# Patient Record
Sex: Female | Born: 1965 | Race: White | Hispanic: No | Marital: Married | Smoking: Never smoker
Health system: Southern US, Community
[De-identification: ages and names within clinical notes are randomized; demographics above are authoritative.]

## PROBLEM LIST (undated history)

## (undated) DIAGNOSIS — R519 Headache, unspecified: Secondary | ICD-10-CM

## (undated) DIAGNOSIS — E079 Disorder of thyroid, unspecified: Secondary | ICD-10-CM

## (undated) DIAGNOSIS — J189 Pneumonia, unspecified organism: Secondary | ICD-10-CM

## (undated) DIAGNOSIS — D219 Benign neoplasm of connective and other soft tissue, unspecified: Secondary | ICD-10-CM

## (undated) DIAGNOSIS — K219 Gastro-esophageal reflux disease without esophagitis: Secondary | ICD-10-CM

## (undated) DIAGNOSIS — Z9889 Other specified postprocedural states: Secondary | ICD-10-CM

## (undated) DIAGNOSIS — Z889 Allergy status to unspecified drugs, medicaments and biological substances status: Secondary | ICD-10-CM

## (undated) DIAGNOSIS — M1990 Unspecified osteoarthritis, unspecified site: Secondary | ICD-10-CM

## (undated) DIAGNOSIS — S86302A Unspecified injury of muscle(s) and tendon(s) of peroneal muscle group at lower leg level, left leg, initial encounter: Secondary | ICD-10-CM

## (undated) DIAGNOSIS — R112 Nausea with vomiting, unspecified: Secondary | ICD-10-CM

## (undated) DIAGNOSIS — S2239XA Fracture of one rib, unspecified side, initial encounter for closed fracture: Secondary | ICD-10-CM

## (undated) DIAGNOSIS — Z803 Family history of malignant neoplasm of breast: Secondary | ICD-10-CM

## (undated) DIAGNOSIS — R7989 Other specified abnormal findings of blood chemistry: Secondary | ICD-10-CM

## (undated) DIAGNOSIS — K9041 Non-celiac gluten sensitivity: Secondary | ICD-10-CM

## (undated) DIAGNOSIS — Z8619 Personal history of other infectious and parasitic diseases: Secondary | ICD-10-CM

## (undated) DIAGNOSIS — G43909 Migraine, unspecified, not intractable, without status migrainosus: Secondary | ICD-10-CM

## (undated) DIAGNOSIS — T8859XA Other complications of anesthesia, initial encounter: Secondary | ICD-10-CM

## (undated) DIAGNOSIS — J45909 Unspecified asthma, uncomplicated: Secondary | ICD-10-CM

## (undated) DIAGNOSIS — R51 Headache: Secondary | ICD-10-CM

## (undated) HISTORY — DX: Gastro-esophageal reflux disease without esophagitis: K21.9

## (undated) HISTORY — DX: Allergy status to unspecified drugs, medicaments and biological substances: Z88.9

## (undated) HISTORY — DX: Fracture of one rib, unspecified side, initial encounter for closed fracture: S22.39XA

## (undated) HISTORY — DX: Unspecified osteoarthritis, unspecified site: M19.90

## (undated) HISTORY — DX: Migraine, unspecified, not intractable, without status migrainosus: G43.909

## (undated) HISTORY — DX: Unspecified asthma, uncomplicated: J45.909

## (undated) HISTORY — DX: Pneumonia, unspecified organism: J18.9

## (undated) HISTORY — PX: CARDIAC CATHETERIZATION: SHX172

## (undated) HISTORY — DX: Unspecified injury of muscle(s) and tendon(s) of peroneal muscle group at lower leg level, left leg, initial encounter: S86.302A

## (undated) HISTORY — DX: Family history of malignant neoplasm of breast: Z80.3

## (undated) HISTORY — DX: Headache, unspecified: R51.9

## (undated) HISTORY — PX: THYROID SURGERY: SHX805

## (undated) HISTORY — DX: Non-celiac gluten sensitivity: K90.41

## (undated) HISTORY — DX: Personal history of other infectious and parasitic diseases: Z86.19

## (undated) HISTORY — DX: Benign neoplasm of connective and other soft tissue, unspecified: D21.9

## (undated) HISTORY — DX: Disorder of thyroid, unspecified: E07.9

## (undated) HISTORY — DX: Headache: R51

---

## 1993-09-18 HISTORY — PX: CHOLECYSTECTOMY: SHX55

## 2004-09-18 HISTORY — PX: LAPAROSCOPIC HYSTERECTOMY: SHX1926

## 2005-09-18 HISTORY — PX: LAPAROSCOPIC HYSTERECTOMY: SHX1926

## 2007-09-19 HISTORY — PX: CARDIAC CATHETERIZATION: SHX172

## 2007-09-19 HISTORY — PX: HAMMER TOE SURGERY: SHX385

## 2011-09-19 HISTORY — PX: THYROID SURGERY: SHX805

## 2013-06-05 LAB — POCT ERYTHROCYTE SEDIMENTATION RATE, NON-AUTOMATED: Sed Rate: 26

## 2013-09-18 HISTORY — PX: HERNIA REPAIR: SHX51

## 2015-09-19 HISTORY — PX: HERNIA REPAIR: SHX51

## 2016-01-21 LAB — LIPID PANEL
CHOLESTEROL: 154 (ref 0–200)
HDL: 46 (ref 35–70)
LDL CALC: 92
Triglycerides: 81 (ref 40–160)

## 2016-01-21 LAB — BASIC METABOLIC PANEL
Glucose: 86
POTASSIUM: 4.2 (ref 3.4–5.3)
SODIUM: 140 (ref 137–147)

## 2016-07-25 LAB — VITAMIN D 25 HYDROXY (VIT D DEFICIENCY, FRACTURES): VIT D 25 HYDROXY: 58.3

## 2016-07-25 LAB — BASIC METABOLIC PANEL
GLUCOSE: 84
POTASSIUM: 4.4 (ref 3.4–5.3)
Sodium: 137 (ref 137–147)

## 2017-07-16 LAB — BASIC METABOLIC PANEL: CREATININE: 0.8 (ref <=1.1)

## 2017-07-16 LAB — CBC AND DIFFERENTIAL
HEMATOCRIT: 41 (ref 36–46)
Hemoglobin: 13.9 (ref 12.0–16.0)
Platelets: 274 (ref 150–399)
WBC: 8

## 2017-07-16 LAB — HEPATIC FUNCTION PANEL
ALT: 72 — AB (ref 7–35)
AST: 36 — AB (ref 13–35)

## 2017-07-30 LAB — TSH: TSH: 1.86 (ref 0.41–5.90)

## 2018-06-28 ENCOUNTER — Ambulatory Visit: Payer: Self-pay | Admitting: Family Medicine

## 2018-06-28 ENCOUNTER — Ambulatory Visit: Payer: BLUE CROSS/BLUE SHIELD | Admitting: Family Medicine

## 2018-06-28 ENCOUNTER — Encounter: Payer: Self-pay | Admitting: Family Medicine

## 2018-06-28 VITALS — BP 104/82 | HR 80 | Temp 98.1°F | Ht 66.5 in | Wt 178.8 lb

## 2018-06-28 DIAGNOSIS — Z0000 Encounter for general adult medical examination without abnormal findings: Secondary | ICD-10-CM

## 2018-06-28 DIAGNOSIS — M1990 Unspecified osteoarthritis, unspecified site: Secondary | ICD-10-CM | POA: Insufficient documentation

## 2018-06-28 DIAGNOSIS — J4520 Mild intermittent asthma, uncomplicated: Secondary | ICD-10-CM

## 2018-06-28 DIAGNOSIS — M1230 Palindromic rheumatism, unspecified site: Secondary | ICD-10-CM

## 2018-06-28 DIAGNOSIS — Z1211 Encounter for screening for malignant neoplasm of colon: Secondary | ICD-10-CM

## 2018-06-28 DIAGNOSIS — E039 Hypothyroidism, unspecified: Secondary | ICD-10-CM

## 2018-06-28 DIAGNOSIS — Z23 Encounter for immunization: Secondary | ICD-10-CM | POA: Diagnosis not present

## 2018-06-28 DIAGNOSIS — J45909 Unspecified asthma, uncomplicated: Secondary | ICD-10-CM | POA: Insufficient documentation

## 2018-06-28 LAB — LIPID PANEL
Cholesterol: 164 mg/dL (ref 0–200)
HDL: 56.2 mg/dL (ref >39.00)
LDL Cholesterol: 90 mg/dL (ref 0–99)
NonHDL: 107.42
TRIGLYCERIDES: 85 mg/dL (ref 0.0–149.0)
Total CHOL/HDL Ratio: 3
VLDL: 17 mg/dL (ref 0.0–40.0)

## 2018-06-28 LAB — COMPREHENSIVE METABOLIC PANEL
ALT: 54 U/L — ABNORMAL HIGH (ref 0–35)
AST: 40 U/L — ABNORMAL HIGH (ref 0–37)
Albumin: 4.5 g/dL (ref 3.5–5.2)
Alkaline Phosphatase: 68 U/L (ref 39–117)
BUN: 21 mg/dL (ref 6–23)
CHLORIDE: 103 meq/L (ref 96–112)
CO2: 28 mEq/L (ref 19–32)
Calcium: 9.6 mg/dL (ref 8.4–10.5)
Creatinine, Ser: 0.78 mg/dL (ref 0.40–1.20)
GFR: 83.03 mL/min (ref >60.00)
GLUCOSE: 82 mg/dL (ref 70–99)
POTASSIUM: 4.7 meq/L (ref 3.5–5.1)
SODIUM: 139 meq/L (ref 135–145)
Total Bilirubin: 0.6 mg/dL (ref 0.2–1.2)
Total Protein: 6.9 g/dL (ref 6.0–8.3)

## 2018-06-28 LAB — CBC WITH DIFFERENTIAL/PLATELET
BASOS PCT: 0.7 % (ref 0.0–3.0)
Basophils Absolute: 0.1 10*3/uL (ref 0.0–0.1)
EOS ABS: 0.1 10*3/uL (ref 0.0–0.7)
EOS PCT: 0.8 % (ref 0.0–5.0)
HCT: 40.2 % (ref 36.0–46.0)
HEMOGLOBIN: 13.5 g/dL (ref 12.0–15.0)
LYMPHS ABS: 2.3 10*3/uL (ref 0.7–4.0)
Lymphocytes Relative: 27.5 % (ref 12.0–46.0)
MCHC: 33.5 g/dL (ref 30.0–36.0)
MCV: 87.1 fl (ref 78.0–100.0)
MONO ABS: 0.5 10*3/uL (ref 0.1–1.0)
Monocytes Relative: 6 % (ref 3.0–12.0)
NEUTROS PCT: 65 % (ref 43.0–77.0)
Neutro Abs: 5.5 10*3/uL (ref 1.4–7.7)
PLATELETS: 285 10*3/uL (ref 150.0–400.0)
RBC: 4.61 Mil/uL (ref 3.87–5.11)
RDW: 13.2 % (ref 11.5–15.5)
WBC: 8.5 10*3/uL (ref 4.0–10.5)

## 2018-06-28 LAB — T4, FREE: Free T4: 0.97 ng/dL (ref 0.60–1.60)

## 2018-06-28 LAB — TSH: TSH: 4.85 u[IU]/mL — AB (ref 0.35–4.50)

## 2018-06-28 MED ORDER — MELOXICAM 15 MG PO TABS: 15.0000 mg | ORAL_TABLET | Freq: Every day | ORAL | 1 refills | Status: DC

## 2018-06-28 MED ORDER — MONTELUKAST SODIUM 10 MG PO TABS: 10.0000 mg | ORAL_TABLET | Freq: Every day | ORAL | 3 refills | Status: DC

## 2018-06-28 MED ORDER — HYDROXYCHLOROQUINE SULFATE 200 MG PO TABS: ORAL_TABLET | ORAL | 0 refills | Status: DC

## 2018-06-28 MED ORDER — ALBUTEROL SULFATE HFA 108 (90 BASE) MCG/ACT IN AERS: 2.0000 | INHALATION_SPRAY | Freq: Four times a day (QID) | RESPIRATORY_TRACT | 5 refills | Status: DC | PRN

## 2018-06-28 MED ORDER — ALLEGRA-D ALLERGY & CONGESTION 180-240 MG PO TB24: 1.0000 | ORAL_TABLET | Freq: Every day | ORAL | 3 refills | Status: DC

## 2018-06-28 MED ORDER — ADVAIR DISKUS 250-50 MCG/DOSE IN AEPB: 1.0000 | INHALATION_SPRAY | Freq: Two times a day (BID) | RESPIRATORY_TRACT | 11 refills | Status: DC

## 2018-06-28 NOTE — Progress Notes (Signed)
Patient: [name] MRN: [id number] DOB: 1965-12-30 PCP: Orma Flaming, MD      Subjective:  Chief Complaint  Patient presents with  . Establish Care    HPI: The patient is a 52 y.o. female who presents today for annual exam. She denies any changes to past medical history. There have been no recent hospitalizations. They are following a well balanced diet and exercise plan. Weight has been stable. No complaints today.   Hypothyroidism: she Is currently on synthroid. She is hypothyroid s/p thyroidectomy due to nodules. No cancer. Denies any symptoms.   Asthma: currently on singulair, advair and albuterol prn. Well controlled.   Arthritis: on plaquenil and mobic. Was followed by rhuematology in [place]. 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  Denies any RA/psoriatic arthritis or other auto immune issue. Has eyes checked yearly.   Immunization History  Administered Date(s) Administered  . Influenza,inj,Quad PF,6+ Mos 06/28/2018  . Tdap 06/28/2018     Colonoscopy: never had this. No FH  Mammogram: 01/2018. normal Pap smear: n/a due to hysterectomy Tdap: today Flu: today    Review of Systems  Constitutional: Negative for chills, fatigue and fever.  HENT: Negative for dental problem, ear pain, hearing loss and trouble swallowing.   Eyes: Negative for visual disturbance.  Respiratory: Negative for cough, chest tightness and shortness of breath.   Cardiovascular: Negative for chest pain, palpitations and leg swelling.  Gastrointestinal: Negative for abdominal pain, blood in stool, diarrhea and nausea.  Endocrine: Negative for cold intolerance, polydipsia, polyphagia and polyuria.  Genitourinary: Negative for dysuria and hematuria.  Musculoskeletal: Positive for back pain. Negative for arthralgias and neck pain.  Skin: Negative for rash.  Neurological: Positive for headaches. Negative for dizziness.  Psychiatric/Behavioral: Positive for sleep disturbance. Negative for dysphoric mood. The  patient is not nervous/anxious.     Allergies Patient is allergic to gluten meal; lortab [hydrocodone-acetaminophen]; shellfish allergy; and strawberry (diagnostic).  Past Medical History Patient  has a past medical history of Arthritis, Asthma, Frequent headaches, GERD (gastroesophageal reflux disease), H/O seasonal allergies, History of chicken pox, Migraines, and Thyroid disease.  Surgical History Patient  has a past surgical history that includes Cholecystectomy (1995); Laparoscopic hysterectomy (2006); Thyroid surgery; Cardiac catheterization; Hammer toe surgery (2009); and Hernia repair (2017).  Family History Pateint's family history includes Alcohol abuse in her brother and paternal grandmother; Arthritis in her mother; Asthma in her brother, daughter, son, and son; Birth defects in her brother; COPD in her sister; Cancer in her brother, mother, paternal grandfather, and paternal grandmother; Diabetes in her father; Early death in her brother; Heart attack in her father, maternal grandmother, and paternal grandfather; Heart disease in her father and maternal grandmother; Hyperlipidemia in her brother, father, and maternal grandmother; Hypertension in her father; Mental illness in her brother; Miscarriages / Stillbirths in her mother; Stroke in her father and maternal grandmother.  Social History Patient  reports that she has never smoked. She has never used smokeless tobacco. She reports that she drinks alcohol. She reports that she does not use drugs.    Objective: Vitals:   06/28/18 0912  BP: 104/82  Pulse: 80  Temp: 98.1 F (36.7 C)  TempSrc: Oral  SpO2: 98%  Weight: 178 lb 12.8 oz (81.1 kg)  Height: 5' 6.5" (1.689 m)    Body mass index is 28.43 kg/m.  Physical Exam  Constitutional: She is oriented to person, place, and time. She appears well-developed and well-nourished.  HENT:  Right Ear: External ear normal.  Left Ear:  External ear normal.  Mouth/Throat:  Oropharynx is clear and moist.  Eyes: Pupils are equal, round, and reactive to light. Conjunctivae and EOM are normal.  Neck: Normal range of motion. Neck supple. No thyromegaly present.  Cardiovascular: Normal rate, regular rhythm, normal heart sounds and intact distal pulses.  No murmur heard. Pulmonary/Chest: Effort normal and breath sounds normal.  Abdominal: Soft. Bowel sounds are normal. She exhibits no distension. There is no tenderness.  Lymphadenopathy:    She has no cervical adenopathy.  Neurological: She is alert and oriented to person, place, and time. She displays normal reflexes. No cranial nerve deficit. Coordination normal.  Skin: Skin is warm and dry. No rash noted.  Psychiatric: She has a normal mood and affect. Her behavior is normal.  Vitals reviewed.      Assessment/plan: 1. Annual physical exam Routine lab work and shots today. mmg sheet given, but does not need until may of 2020. Discussed colon cancer screening and she has decided to do cologuard. Continue healthy diet and exercise. F/u in one year or as needed.  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 Would like her to see rheumatology for this.  - Ambulatory referral to Rheumatology  3. Intermittent asthma without complication, unspecified asthma severity Well controlled. Needs refills of medication. Is requesting proair as well as the ventolin does not work as well for her and thinks it has to do with the inhaler delivery system. Will send both of these in for her.   4. Acquired hypothyroidism Checking labs and then refilling medication.  - TSH - T4, free  5. Encounter for screening colonoscopy  - Cologuard  6. Need for prophylactic vaccination and inoculation against influenza  - Flu Vaccine QUAD 6+ mos PF IM (Fluarix Quad PF)  7. Need for prophylactic vaccination with combined  diphtheria-tetanus-pertussis (DTP) vaccine  - Tdap vaccine greater than or equal to 7yo IM       Return in about 1 year (around 06/29/2019).     Orma Flaming, MD [place]  06/28/2018

## 2018-06-28 NOTE — Progress Notes (Signed)
Per Dr. Kirke Corin office request, called patient and asked her to please request records from previous rheumatologist's office.  Dr. Kirke Corin office will not schedule until they have those previous records to review.  Pt verbalized understanding.

## 2018-07-01 ENCOUNTER — Other Ambulatory Visit: Payer: Self-pay | Admitting: Family Medicine

## 2018-07-01 DIAGNOSIS — E038 Other specified hypothyroidism: Secondary | ICD-10-CM

## 2018-07-01 DIAGNOSIS — R748 Abnormal levels of other serum enzymes: Secondary | ICD-10-CM

## 2018-07-01 MED ORDER — LEVOTHYROXINE SODIUM 175 MCG PO TABS: 175.0000 ug | ORAL_TABLET | Freq: Every day | ORAL | 0 refills | Status: DC

## 2018-07-03 LAB — CALCIUM: .: 9.2

## 2018-07-05 ENCOUNTER — Encounter: Payer: Self-pay | Admitting: Family Medicine

## 2018-07-05 ENCOUNTER — Other Ambulatory Visit: Payer: Self-pay | Admitting: Family Medicine

## 2018-07-05 DIAGNOSIS — E559 Vitamin D deficiency, unspecified: Secondary | ICD-10-CM

## 2018-07-05 DIAGNOSIS — E785 Hyperlipidemia, unspecified: Secondary | ICD-10-CM | POA: Insufficient documentation

## 2018-07-05 DIAGNOSIS — J4540 Moderate persistent asthma, uncomplicated: Secondary | ICD-10-CM | POA: Insufficient documentation

## 2018-07-05 DIAGNOSIS — M2550 Pain in unspecified joint: Secondary | ICD-10-CM | POA: Insufficient documentation

## 2018-07-11 LAB — COLOGUARD: Cologuard: NEGATIVE

## 2018-07-19 ENCOUNTER — Telehealth: Payer: Self-pay | Admitting: Family Medicine

## 2018-07-19 NOTE — Telephone Encounter (Signed)
Please let her know that her cologuard test is negative. Yeah! Can repeat in 3 years time.

## 2018-07-19 NOTE — Telephone Encounter (Signed)
Called and spoke with patient and informed her of negative Cologuard results. She verbalized understanding.

## 2018-07-24 LAB — COLOGUARD: Cologuard: NEGATIVE

## 2018-08-02 ENCOUNTER — Telehealth: Payer: Self-pay

## 2018-08-02 NOTE — Telephone Encounter (Signed)
Called patient and left voicemail message requesting a call back (gave pt my direct line) We have received medical records from her previous rheumatologist in [place].  I need her to stop in to our office and sign a ROI so that we can fax her medical records to Dr. Kirke Corin office.  No CRM created.

## 2018-08-05 ENCOUNTER — Telehealth: Payer: Self-pay

## 2018-08-05 NOTE — Telephone Encounter (Signed)
Called and spoke with patient and advised that we have received old rheumatology records from [place].  Pt will need to stop in to our office to sign ROI.

## 2018-08-08 DIAGNOSIS — M1239 Palindromic rheumatism, multiple sites: Secondary | ICD-10-CM | POA: Insufficient documentation

## 2018-08-08 DIAGNOSIS — M1612 Unilateral primary osteoarthritis, left hip: Secondary | ICD-10-CM

## 2018-08-08 DIAGNOSIS — Z791 Long term (current) use of non-steroidal anti-inflammatories (NSAID): Secondary | ICD-10-CM | POA: Insufficient documentation

## 2018-08-08 DIAGNOSIS — M25552 Pain in left hip: Secondary | ICD-10-CM

## 2018-08-08 LAB — CHG X-RAY HIP UNILAT 1 VW

## 2018-08-28 ENCOUNTER — Other Ambulatory Visit (INDEPENDENT_AMBULATORY_CARE_PROVIDER_SITE_OTHER): Payer: BLUE CROSS/BLUE SHIELD

## 2018-08-28 DIAGNOSIS — E559 Vitamin D deficiency, unspecified: Secondary | ICD-10-CM | POA: Diagnosis not present

## 2018-08-28 DIAGNOSIS — R748 Abnormal levels of other serum enzymes: Secondary | ICD-10-CM | POA: Diagnosis not present

## 2018-08-28 DIAGNOSIS — E038 Other specified hypothyroidism: Secondary | ICD-10-CM

## 2018-08-28 LAB — VITAMIN D 25 HYDROXY (VIT D DEFICIENCY, FRACTURES): VITD: 50.89 ng/mL (ref 30.00–100.00)

## 2018-08-28 LAB — COMPREHENSIVE METABOLIC PANEL
ALT: 39 U/L — ABNORMAL HIGH (ref 0–35)
AST: 38 U/L — AB (ref 0–37)
Albumin: 4.2 g/dL (ref 3.5–5.2)
Alkaline Phosphatase: 63 U/L (ref 39–117)
BUN: 19 mg/dL (ref 6–23)
CALCIUM: 9.2 mg/dL (ref 8.4–10.5)
CHLORIDE: 102 meq/L (ref 96–112)
CO2: 29 mEq/L (ref 19–32)
CREATININE: 0.68 mg/dL (ref 0.40–1.20)
GFR: 97.21 mL/min (ref >60.00)
Glucose, Bld: 88 mg/dL (ref 70–99)
POTASSIUM: 4 meq/L (ref 3.5–5.1)
Sodium: 137 mEq/L (ref 135–145)
Total Bilirubin: 0.6 mg/dL (ref 0.2–1.2)
Total Protein: 6.3 g/dL (ref 6.0–8.3)

## 2018-08-28 LAB — TSH: TSH: 0.26 u[IU]/mL — ABNORMAL LOW (ref 0.35–4.50)

## 2018-08-28 LAB — T4, FREE: Free T4: 1.11 ng/dL (ref 0.60–1.60)

## 2018-08-29 ENCOUNTER — Encounter: Payer: Self-pay | Admitting: Family Medicine

## 2018-08-30 ENCOUNTER — Other Ambulatory Visit: Payer: Self-pay | Admitting: Family Medicine

## 2018-08-30 DIAGNOSIS — E039 Hypothyroidism, unspecified: Secondary | ICD-10-CM

## 2018-08-30 MED ORDER — LEVOTHYROXINE SODIUM 150 MCG PO TABS: ORAL_TABLET | ORAL | 3 refills | Status: DC

## 2018-08-30 MED ORDER — LEVOTHYROXINE SODIUM 175 MCG PO TABS: ORAL_TABLET | ORAL | 3 refills | Status: DC

## 2018-09-19 NOTE — Progress Notes (Signed)
Office Visit Note  Patient: [name]             Date of Birth: 1965-10-18           MRN: [id number]             PCP: Orma Flaming, MD Referring: Orma Flaming, MD Visit Date: 09/27/2018 Occupation: @GUAROCC @  Subjective:  Pain in hands.   History of Present Illness: [name] is a 53 y.o. female in consultation per request of her PCP.  According to patient her symptoms started about 2 years ago with increased pain and stiffness in her bilateral hands.  She states over time she started having pain in her ankles and her left hip.  She also noticed decreased grip strength.  She was seen by her PCP while she was living in [place] and her labs showed elevated sedimentation rate and C-reactive protein.  She was referred to a rheumatologist who after evaluation and x-rays diagnosed her with palindromic rheumatism.  She was placed on Plaquenil and meloxicam.  She states Plaquenil she has been taking for 2 years but she cannot tell a difference.  She notices difference when she takes meloxicam.  She states she has intermittent flares and has not had a flare in a long time.  Recently she has been experiencing increased pain and discomfort with the weather change.  She has been having pain and discomfort in her both hands and bilateral wrist.  Activities of Daily Living:  Patient reports morning stiffness for several hours.   Patient Reports nocturnal pain.  Difficulty dressing/grooming: Denies Difficulty climbing stairs: Denies Difficulty getting out of chair: Denies Difficulty using hands for taps, buttons, cutlery, and/or writing: Reports  Review of Systems  Constitutional: Negative for fatigue.  HENT: Negative for mouth sores, trouble swallowing, trouble swallowing and mouth dryness.   Eyes: Negative for pain, redness, itching and dryness.  Respiratory: Positive for wheezing and difficulty breathing.        Due to asthma   Cardiovascular: Negative for chest pain, palpitations and  swelling in legs/feet.  Gastrointestinal: Negative for abdominal pain, blood in stool, constipation and diarrhea.  Endocrine: Negative for increased urination.  Genitourinary: Negative for painful urination, nocturia and pelvic pain.  Musculoskeletal: Positive for arthralgias, joint pain, joint swelling and morning stiffness.  Skin: Positive for rash. Negative for hair loss.  Allergic/Immunologic: Negative for susceptible to infections.  Neurological: Positive for headaches. Negative for dizziness, light-headedness, memory loss and weakness.  Hematological: Negative for bruising/bleeding tendency.  Psychiatric/Behavioral: Negative for confusion. The patient is not nervous/anxious.     PMFS History:  Patient Active Problem List   Diagnosis Date Noted  . 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 Primary osteoarthritis of left hip 08/08/2018  . Moderate persistent asthma 07/05/2018  . Hypothyroid 06/28/2018    Past Medical History:  Diagnosis Date  . Arthritis   . Asthma   . Frequent headaches   . GERD (gastroesophageal reflux disease)   . H/O seasonal allergies   . History of chicken pox   . Migraines   . Thyroid disease     Family History  Problem Relation Age of Onset  . Arthritis Mother   . Cancer Mother        breast ca  . Miscarriages / [place] Mother   . Diabetes Father   . Heart attack Father   . Heart disease Father   . Hyperlipidemia Father   . Hypertension Father   . Stroke Father  x3  . COPD Sister   . Peripheral Artery Disease Sister   . Asthma Brother   . Birth defects Brother   . Cancer Brother        skin  . Hyperlipidemia Brother   . Heart attack Maternal Grandmother   . Heart disease Maternal Grandmother   . Hyperlipidemia Maternal Grandmother   . Stroke Maternal Grandmother   . Alcohol abuse Paternal Grandmother   . Cancer Paternal Grandmother   . Cancer Paternal Grandfather        breast  . Heart attack Paternal  Grandfather   . Alcohol abuse Brother   . Early death Brother        suicide  . Mental illness Brother   . Asthma Daughter   . Asthma Son   . Asthma Son    Past Surgical History:  Procedure Laterality Date  . CARDIAC CATHETERIZATION    . CHOLECYSTECTOMY  1995  . [place]  2009  . HERNIA REPAIR  2017  . LAPAROSCOPIC HYSTERECTOMY  2006  . THYROID SURGERY     Social History   Social History Narrative  . Not on file    Objective: Vital Signs: BP (!) 145/88 (BP Location: Right Arm, Patient Position: Sitting, Cuff Size: Normal)   Pulse 68   Resp 13   Ht 5' 6"  (1.676 m)   Wt 178 lb (80.7 kg)   LMP  (LMP Unknown)   BMI 28.73 kg/m    Physical Exam Vitals signs and nursing note reviewed.  Constitutional:      Appearance: She is well-developed.  HENT:     Head: Normocephalic and atraumatic.  Eyes:     Conjunctiva/sclera: Conjunctivae normal.  Neck:     Musculoskeletal: Normal range of motion.  Cardiovascular:     Rate and Rhythm: Normal rate and regular rhythm.     Heart sounds: Normal heart sounds.  Pulmonary:     Effort: Pulmonary effort is normal.     Breath sounds: Normal breath sounds.  Abdominal:     General: Bowel sounds are normal.     Palpations: Abdomen is soft.  Lymphadenopathy:     Cervical: No cervical adenopathy.  Skin:    General: Skin is warm and dry.     Capillary Refill: Capillary refill takes less than 2 seconds.  Neurological:     Mental Status: She is alert and oriented to person, place, and time.  Psychiatric:        Behavior: Behavior normal.      Musculoskeletal Exam: On thoracic lumbar spine good range of motion.  Shoulder joints elbow joints wrist joints with good range of motion.  She had tenderness over bilateral wrist joints and MCP joints.  She also had tenderness over right CMC joint.  Hip joints knee joints ankles MTPs PIPs been good range of motion.  She had discomfort range of motion of her left hip joint.  CDAI  Exam: CDAI Score: 5.1  Patient Global Assessment: 7 (mm); Provider Global Assessment: 4 (mm) Swollen: 0 ; Tender: 5  Joint Exam      Right  Left  Wrist   Tender   Tender  MCP 2   Tender     MCP 3   Tender     Hip      Tender     Investigation: No additional findings.  Imaging: Xr Hip Unilat W Or W/o Pelvis 2-3 Views Left  Result Date: 09/27/2018 No hip joint narrowing was noted.  No SI joint changes were noted.  No chondrocalcinosis was noted. Impression: Unremarkable x-ray of the hip joint.  Xr Foot 2 Views Left  Result Date: 09/27/2018 PIP and DIP severe narrowing was noted.  No MTP changes were noted.  A pin was noted in the second PIP joint.  No intertarsal or tibiotalar joint space narrowing was noted.  A small calcaneal spur was noted.  No erosive changes were noted. Impression: These findings are consistent with osteoarthritis of the foot and postsurgical changes.  Xr Foot 2 Views Right  Result Date: 09/27/2018 PIP and DIP narrowing was noted.  Pin placement was noted in second fourth and fifth phalanxes.  No intertarsal joint space or tibiotalar joint space narrowing was noted.  Small calcaneal spur was noted. Impression: These findings are consistent with osteoarthritis of the foot and postsurgical changes.  Xr Hand 2 View Left  Result Date: 09/27/2018 No MCP, intercarpal radiocarpal joint space narrowing was noted.  Minimal PIP narrowing was noted.  No erosive changes were noted.  No juxta-articular osteopenia was noted. Impression: Unremarkable x-ray of the hand.  Xr Hand 2 View Right  Result Date: 09/27/2018 No MCP, intercarpal radiocarpal joint space narrowing was noted.  Minimal PIP narrowing was noted.  No erosive changes were noted.  No juxta-articular osteopenia was noted. Impression: Unremarkable x-ray of the hand.   Recent Labs: Lab Results  Component Value Date   WBC 8.5 06/28/2018   HGB 13.5 06/28/2018   PLT 285.0 06/28/2018   NA 137 08/28/2018   K  4.0 08/28/2018   CL 102 08/28/2018   CO2 29 08/28/2018   GLUCOSE 88 08/28/2018   BUN 19 08/28/2018   CREATININE 0.68 08/28/2018   BILITOT 0.6 08/28/2018   ALKPHOS 63 08/28/2018   AST 38 (H) 08/28/2018   ALT 39 (H) 08/28/2018   PROT 6.3 08/28/2018   ALBUMIN 4.2 08/28/2018   CALCIUM 9.2 08/28/2018    Speciality Comments: No specialty comments available.  Procedures:  No procedures performed Allergies: Gluten meal; Lortab [hydrocodone-acetaminophen]; Shellfish allergy; and Strawberry (diagnostic)   Assessment / Plan:     Visit Diagnoses: Palindromic rheumatism - treated at [place] [facility].  Patient gives history of intermittent flares of palindromic rheumatism.  She states she has not had a flare in a long time.  She has been experiencing increased pain and discomfort with the weather change.  Today she had tenderness on palpation over wrist joints and MCP joints.  No obvious synovitis was noted.  I will schedule ultrasound of her bilateral hands to look for synovitis.  High risk medication use - PLQ 200 mg alternating with 400 mg every other day, Meloxicam 15 mg po dailyeye exam: September 2018 (documented in rheum note) - Plan: CBC with Differential/Platelet, COMPLETE METABOLIC PANEL WITH GFR.  Based on her weight I will increase her Plaquenil to 200 mg p.o. twice daily.  Her LFTs are elevated which could be related to meloxicam use.  We will monitor LFTs for right now.  Detailed counseling guarding Plaquenil was provided.  Indications side effects contraindications were discussed and a handout was given.  She has been advised to get baseline eye examination and yearly eye examination.  Immunization was also discussed.  Pain in both hands -she had tenderness over bilateral wrist joints and MCPs.  She also had tenderness over right CMC joint.  Plan: XR Hand 2 View Right, XR Hand 2 View Left, x-ray of bilateral hands were unremarkable.  Sedimentation rate, Rheumatoid  factor, Cyclic citrul peptide antibody, IgG, ANA, HLA-B27 antigen.  Have advised her to try Voltaren gel which can be used topically for discomfort.  Side effects were discussed.  A prescription for Voltaren gel was given.  Pain in left hip - Plan: XR HIP UNILAT W OR W/O PELVIS 2-3 VIEWS LEFT.  The x-ray of the hip joint was unremarkable.  Pain in both feet -she has discomfort on palpation of bilateral ankle joints and MTPs but no synovitis was noted.  Plan: XR Foot 2 Views Right, XR Foot 2 Views Left.  X-ray of bilateral feet showed osteoarthritic changes and postsurgical changes.  Other fatigue - Plan: Glucose 6 phosphate dehydrogenase, CK, Serum protein electrophoresis with reflex  Elevated LFTs-most likely due to chronic NSAID use.  History of hypothyroidism  History of gastroesophageal reflux (GERD)  Hx of migraines  History of asthma   Orders: Orders Placed This Encounter  Procedures  . XR Hand 2 View Right  . XR Hand 2 View Left  . XR HIP UNILAT W OR W/O PELVIS 2-3 VIEWS LEFT  . XR Foot 2 Views Right  . XR Foot 2 Views Left  . Sedimentation rate  . Rheumatoid factor  . Cyclic citrul peptide antibody, IgG  . ANA  . HLA-B27 antigen  . Glucose 6 phosphate dehydrogenase  . CK  . Serum protein electrophoresis with reflex  . CBC with Differential/Platelet  . COMPLETE METABOLIC PANEL WITH GFR   Meds ordered this encounter  Medications  . hydroxychloroquine (PLAQUENIL) 200 MG tablet    Sig: Take 1 tablet (200 mg total) by mouth 2 (two) times daily.    Dispense:  180 tablet    Refill:  0  . diclofenac sodium (VOLTAREN) 1 % GEL    Sig: 3 grams to 3 large joints up to 3 times daily    Dispense:  3 Tube    Refill:  3    Face-to-face time spent with patient was 50 minutes. Greater than 50% of time was spent in counseling and coordination of care.  Follow-Up Instructions: Return for palindromic rheumatism.   Bo Merino, MD  Note - This record has been created  using Editor, commissioning.  Chart creation errors have been sought, but may not always  have been located. Such creation errors do not reflect on  the standard of medical care.

## 2018-09-22 ENCOUNTER — Other Ambulatory Visit: Payer: Self-pay | Admitting: Family Medicine

## 2018-09-27 ENCOUNTER — Encounter: Payer: Self-pay | Admitting: Rheumatology

## 2018-09-27 ENCOUNTER — Ambulatory Visit (INDEPENDENT_AMBULATORY_CARE_PROVIDER_SITE_OTHER): Payer: Self-pay

## 2018-09-27 ENCOUNTER — Ambulatory Visit: Payer: BLUE CROSS/BLUE SHIELD | Admitting: Rheumatology

## 2018-09-27 VITALS — BP 145/88 | HR 68 | Resp 13 | Ht 66.0 in | Wt 178.0 lb

## 2018-09-27 DIAGNOSIS — M79672 Pain in left foot: Secondary | ICD-10-CM

## 2018-09-27 DIAGNOSIS — M79641 Pain in right hand: Secondary | ICD-10-CM

## 2018-09-27 DIAGNOSIS — Z79899 Other long term (current) drug therapy: Secondary | ICD-10-CM

## 2018-09-27 DIAGNOSIS — M79671 Pain in right foot: Secondary | ICD-10-CM

## 2018-09-27 DIAGNOSIS — M79642 Pain in left hand: Secondary | ICD-10-CM | POA: Diagnosis not present

## 2018-09-27 DIAGNOSIS — Z8719 Personal history of other diseases of the digestive system: Secondary | ICD-10-CM

## 2018-09-27 DIAGNOSIS — Z8669 Personal history of other diseases of the nervous system and sense organs: Secondary | ICD-10-CM

## 2018-09-27 DIAGNOSIS — R7989 Other specified abnormal findings of blood chemistry: Secondary | ICD-10-CM

## 2018-09-27 DIAGNOSIS — R5383 Other fatigue: Secondary | ICD-10-CM | POA: Diagnosis not present

## 2018-09-27 DIAGNOSIS — Z8639 Personal history of other endocrine, nutritional and metabolic disease: Secondary | ICD-10-CM

## 2018-09-27 DIAGNOSIS — M25552 Pain in left hip: Secondary | ICD-10-CM | POA: Diagnosis not present

## 2018-09-27 DIAGNOSIS — M1230 Palindromic rheumatism, unspecified site: Secondary | ICD-10-CM

## 2018-09-27 DIAGNOSIS — R945 Abnormal results of liver function studies: Secondary | ICD-10-CM

## 2018-09-27 DIAGNOSIS — Z8709 Personal history of other diseases of the respiratory system: Secondary | ICD-10-CM

## 2018-09-27 MED ORDER — DICLOFENAC SODIUM 1 % TD GEL: TRANSDERMAL | 3 refills | Status: DC

## 2018-09-27 MED ORDER — HYDROXYCHLOROQUINE SULFATE 200 MG PO TABS: 200.0000 mg | ORAL_TABLET | Freq: Two times a day (BID) | ORAL | 0 refills | Status: DC

## 2018-09-27 NOTE — Progress Notes (Signed)
Pharmacy Note  Subjective: Patient presents today to the [facility] to see Dr. Estanislado Pandy.  Patient seen by the pharmacist for counseling on hydroxychloroquine for Palindromic rheumatism.  She has been on Plaquenil in the past and tolerated well.  Objective: CMP     Component Value Date/Time   NA 137 08/28/2018 0922   NA 137 07/25/2016   K 4.0 08/28/2018 0922   CL 102 08/28/2018 0922   CO2 29 08/28/2018 0922   GLUCOSE 88 08/28/2018 0922   BUN 19 08/28/2018 0922   CREATININE 0.68 08/28/2018 0922   CALCIUM 9.2 08/28/2018 0922   PROT 6.3 08/28/2018 0922   ALBUMIN 4.2 08/28/2018 0922   AST 38 (H) 08/28/2018 0922   ALT 39 (H) 08/28/2018 0922   ALKPHOS 63 08/28/2018 0922   BILITOT 0.6 08/28/2018 0922    CBC    Component Value Date/Time   WBC 8.5 06/28/2018 0938   RBC 4.61 06/28/2018 0938   HGB 13.5 06/28/2018 0938   HCT 40.2 06/28/2018 0938   PLT 285.0 06/28/2018 0938   MCV 87.1 06/28/2018 0938   MCHC 33.5 06/28/2018 0938   RDW 13.2 06/28/2018 0938   LYMPHSABS 2.3 06/28/2018 0938   MONOABS 0.5 06/28/2018 0938   EOSABS 0.1 06/28/2018 0938   BASOSABS 0.1 06/28/2018 0938    Assessment/Plan: Patient was counseled on the purpose, proper use, and adverse effects of hydroxychloroquine including nausea/diarrhea, skin rash, headaches, and sun sensitivity.  Discussed importance of annual eye exams while on hydroxychloroquine to monitor to ocular toxicity and discussed importance of frequent laboratory monitoring.  Provided patient with eye exam form for baseline ophthalmologic exam and standing lab instructions.  She is to come in March and then monitor every 3 months due to elevated LFT's.  Provided patient with educational materials on hydroxychloroquine and answered all questions.  Patient consented to hydroxychloroquine.  Will upload consent in the media tab.    Dose will be 200 mg twice daily.  Has patient tried NSAID's previously?  Yes, Mobic  Patient on the  purpose, proper use, and adverse effects of Voltaren gel including headache, increased blood pressure, and risk of GI bleed.  Instructed patient to avoid applying to open skin wound, or on areas of infection, rash, burn, or peeling skin.  Advised  patient wait at least 10 minutes before dressing or wearing gloves and wait at least 1 hour before you bathe or shower.  Counseled patient to wash hands after application and avoid contact with face/eyes.  Advised patient to apply with q-tip if applying to hands to minimize absorption on palms.  Patient given GoodRx coupon to help with cost as it is not routinely covered by insurance.  Counseled on the purpose, proper use, and adverse effects of natural anti-inflammatories including upset stomach and increased bleeding risk.  Encouraged patient to add one medication at a time and to include on medication list to monitor for adverse effects and drug interactions.  Given educational handout with recommended doses.  All questions encouraged and answered.  Instructed patient to call with any further questions or concerns.  Mariella Saa, PharmD, [facility] Rheumatology Clinical Pharmacist  09/27/2018 11:24 AM

## 2018-09-27 NOTE — Patient Instructions (Addendum)
Standing Labs We placed an order today for your standing lab work.    Please come back and get your standing labs in March and then every 3 months.  We have open lab Monday through Friday from 8:30-11:30 AM and 1:30-4:00 PM  at the office of Dr. Bo Merino.   You may experience shorter wait times on Monday and Friday afternoons. The office is located at [address], [place], [place], [place] [address] No appointment is necessary.   Labs are drawn by [facility].  You may receive a bill from [facility] for your lab work.  If you wish to have your labs drawn at another location, please call the office 24 hours in advance to send orders.  If you have any questions regarding directions or hours of operation,  please call [phone].   Just as a reminder please drink plenty of water prior to coming for your lab work. Thanks!  Vaccines You are taking a medication(s) that can suppress your immune system.  The following immunizations are recommended: . Flu annually . Pneumonia (Pneumovax 23 and Prevnar 13 spaced at least 1 year apart) . Shingrix  Please check with your PCP to make sure you are up to date.

## 2018-09-30 LAB — PROTEIN ELECTROPHORESIS, SERUM, WITH REFLEX
Albumin ELP: 4.2 g/dL (ref 3.8–4.8)
Alpha 1: 0.3 g/dL (ref 0.2–0.3)
Alpha 2: 0.7 g/dL (ref 0.5–0.9)
Beta 2: 0.3 g/dL (ref 0.2–0.5)
Beta Globulin: 0.5 g/dL (ref 0.4–0.6)
Gamma Globulin: 0.8 g/dL (ref 0.8–1.7)
Total Protein: 6.8 g/dL (ref 6.1–8.1)

## 2018-09-30 LAB — GLUCOSE 6 PHOSPHATE DEHYDROGENASE: G-6PDH: 16.2 U/g Hgb (ref 7.0–20.5)

## 2018-09-30 LAB — CK: Total CK: 85 U/L (ref 29–143)

## 2018-09-30 LAB — SEDIMENTATION RATE: Sed Rate: 6 mm/h (ref 0–20)

## 2018-09-30 LAB — ANA: ANA: NEGATIVE

## 2018-09-30 LAB — RHEUMATOID FACTOR: Rheumatoid fact SerPl-aCnc: <14 IU/mL (ref <14)

## 2018-09-30 LAB — CYCLIC CITRUL PEPTIDE ANTIBODY, IGG: Cyclic Citrullin Peptide Ab: <16 UNITS

## 2018-09-30 LAB — HLA-B27 ANTIGEN: HLA-B27 ANTIGEN: NEGATIVE

## 2018-10-02 ENCOUNTER — Ambulatory Visit (INDEPENDENT_AMBULATORY_CARE_PROVIDER_SITE_OTHER): Payer: Self-pay

## 2018-10-02 ENCOUNTER — Ambulatory Visit (INDEPENDENT_AMBULATORY_CARE_PROVIDER_SITE_OTHER): Payer: BLUE CROSS/BLUE SHIELD | Admitting: Rheumatology

## 2018-10-02 DIAGNOSIS — M79642 Pain in left hand: Secondary | ICD-10-CM | POA: Diagnosis not present

## 2018-10-02 DIAGNOSIS — M79641 Pain in right hand: Secondary | ICD-10-CM | POA: Diagnosis not present

## 2018-10-09 DIAGNOSIS — M19072 Primary osteoarthritis, left ankle and foot: Secondary | ICD-10-CM

## 2018-10-09 DIAGNOSIS — M19071 Primary osteoarthritis, right ankle and foot: Secondary | ICD-10-CM | POA: Insufficient documentation

## 2018-10-09 NOTE — Progress Notes (Signed)
Office Visit Note  Patient: [name]             Date of Birth: [birth year]           MRN: [id number]             PCP: Orma Flaming, MD Referring: Orma Flaming, MD Visit Date: 10/23/2018 Occupation: _0 @  Subjective:  Pain in both hands and hips.   History of Present Illness: [name] is a 53 y.o. female with history of palindromic rheumatism.  She states she continues to have discomfort in her bilateral hands and bilateral hip joints.  She also has intermittent swelling and pain in her ankle joints.  She had recent ultrasound examination of her bilateral hands which showed synovitis in her bilateral hands.  Her Plaquenil dose was increased to 200 mg p.o. twice daily.  She has been on the increased dose for the last 2 weeks.  Activities of Daily Living:  Patient reports morning stiffness for 2 hours.   Patient Reports nocturnal pain.  Difficulty dressing/grooming: Denies Difficulty climbing stairs: Denies Difficulty getting out of chair: Denies Difficulty using hands for taps, buttons, cutlery, and/or writing: Reports  Review of Systems  Constitutional: Negative for fatigue, night sweats, weight gain and weight loss.  HENT: Positive for mouth dryness. Negative for mouth sores, trouble swallowing, trouble swallowing and nose dryness.   Eyes: Positive for dryness. Negative for pain, redness and visual disturbance.  Respiratory: Positive for shortness of breath. Negative for cough and difficulty breathing.        Asthma  Cardiovascular: Negative for chest pain, palpitations, hypertension, irregular heartbeat and swelling in legs/feet.  Gastrointestinal: Negative for blood in stool, constipation and diarrhea.  Endocrine: Negative for increased urination.  Genitourinary: Negative for difficulty urinating and vaginal dryness.  Musculoskeletal: Positive for arthralgias, joint pain, joint swelling and morning stiffness. Negative for myalgias, muscle weakness, muscle  tenderness and myalgias.  Skin: Positive for rash. Negative for color change, hair loss, skin tightness, ulcers and sensitivity to sunlight.  Allergic/Immunologic: Negative for susceptible to infections.  Neurological: Positive for weakness. Negative for dizziness, memory loss and night sweats.  Hematological: Positive for bruising/bleeding tendency. Negative for swollen glands.  Psychiatric/Behavioral: Positive for sleep disturbance. Negative for depressed mood. The patient is not nervous/anxious.     PMFS History:  Patient Active Problem List   Diagnosis Date Noted  . Primary osteoarthritis of both feet 10/09/2018  . Hx of migraines 09/27/2018  . History of gastroesophageal reflux (GERD) 09/27/2018  . 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 Moderate persistent asthma 07/05/2018  . Hypothyroid 06/28/2018    Past Medical History:  Diagnosis Date  . Arthritis   . Asthma   . Frequent headaches   . GERD (gastroesophageal reflux disease)   . H/O seasonal allergies   . History of chicken pox   . Migraines   . Thyroid disease     Family History  Problem Relation Age of Onset  . Arthritis Mother   . Cancer Mother        breast ca  . Miscarriages / [place] Mother   . Diabetes Father   . Heart attack Father   . Heart disease Father   . Hyperlipidemia Father   . Hypertension Father   . Stroke Father        x3  . COPD Sister   . Peripheral Artery Disease Sister   . Asthma Brother   . Birth defects Brother   . Cancer Brother  skin  . Hyperlipidemia Brother   . Heart attack Maternal Grandmother   . Heart disease Maternal Grandmother   . Hyperlipidemia Maternal Grandmother   . Stroke Maternal Grandmother   . Alcohol abuse Paternal Grandmother   . Cancer Paternal Grandmother   . Cancer Paternal Grandfather        breast  . Heart attack Paternal Grandfather   . Alcohol abuse Brother   . Early death Brother        suicide  . Mental illness Brother     . Asthma Daughter   . Asthma Son   . Asthma Son    Past Surgical History:  Procedure Laterality Date  . CARDIAC CATHETERIZATION    . CHOLECYSTECTOMY  1995  . [place]  2009  . HERNIA REPAIR  2017  . LAPAROSCOPIC HYSTERECTOMY  2006  . THYROID SURGERY     Social History   Social History Narrative  . Not on file   Immunization History  Administered Date(s) Administered  . Influenza,inj,Quad PF,6+ Mos 06/28/2018  . Influenza-Unspecified 01/25/2018  . Pneumococcal Polysaccharide-23 10/22/2018  . Tdap 06/28/2018     Objective: Vital Signs: BP 122/71 (BP Location: Left Arm, Patient Position: Sitting, Cuff Size: Normal)   Pulse 84   Resp 14   Ht 5' 7" (1.702 m)   Wt 184 lb 9.6 oz (83.7 kg)   LMP  (LMP Unknown)   BMI 28.91 kg/m    Physical Exam Vitals signs and nursing note reviewed.  Constitutional:      Appearance: She is well-developed.  HENT:     Head: Normocephalic and atraumatic.  Eyes:     Conjunctiva/sclera: Conjunctivae normal.  Neck:     Musculoskeletal: Normal range of motion.  Cardiovascular:     Rate and Rhythm: Normal rate and regular rhythm.     Heart sounds: Normal heart sounds.  Pulmonary:     Effort: Pulmonary effort is normal.     Breath sounds: Normal breath sounds.  Abdominal:     General: Bowel sounds are normal.     Palpations: Abdomen is soft.  Lymphadenopathy:     Cervical: No cervical adenopathy.  Skin:    General: Skin is warm and dry.     Capillary Refill: Capillary refill takes less than 2 seconds.  Neurological:     Mental Status: She is alert and oriented to person, place, and time.  Psychiatric:        Behavior: Behavior normal.      Musculoskeletal Exam: C-spine thoracic lumbar spine good range of motion.  Shoulder joints elbow joints wrist joints with good range of motion.  She had tenderness and some synovitis over bilateral MCP joints.  She has tenderness on palpation of her knee joints and ankle joints but no  synovitis was noted.  CDAI Exam: CDAI Score: 10.9  Patient Global Assessment: 4 (mm); Provider Global Assessment: 5 (mm) Swollen: 4 ; Tender: 8  Joint Exam      Right  Left  MCP 2  Swollen Tender  Swollen Tender  MCP 3  Swollen Tender  Swollen Tender  Knee   Tender   Tender  Ankle   Tender   Tender     Investigation: No additional findings.  Imaging: [place] Extrem Up Bilat Comp  Result Date: 10/02/2018 Ultrasound examination of bilateral hands was performed per EULAR recommendations. Using 12 MHz transducer, grayscale and power Doppler bilateral second, third, and fifth MCP joints and bilateral wrist joints both dorsal and  volar aspects were evaluated to look for synovitis or tenosynovitis. The findings were there was mild synovitis in the right second  and fifth MCPs and right wrist joint on ultrasound examination. Right median nerve was 0.07 cm squares which was within normal limits and left median nerve was 0.07 cm squares which was in normal limits. Impression: Ultrasound examination was consistent with inflammatory arthritis.  Bilateral median nerves were within normal limits.  Xr Hip Unilat W Or W/o Pelvis 2-3 Views Left  Result Date: 09/27/2018 No hip joint narrowing was noted.  No SI joint changes were noted.  No chondrocalcinosis was noted. Impression: Unremarkable x-ray of the hip joint.  Xr Foot 2 Views Left  Result Date: 09/27/2018 PIP and DIP severe narrowing was noted.  No MTP changes were noted.  A pin was noted in the second PIP joint.  No intertarsal or tibiotalar joint space narrowing was noted.  A small calcaneal spur was noted.  No erosive changes were noted. Impression: These findings are consistent with osteoarthritis of the foot and postsurgical changes.  Xr Foot 2 Views Right  Result Date: 09/27/2018 PIP and DIP narrowing was noted.  Pin placement was noted in second fourth and fifth phalanxes.  No intertarsal joint space or tibiotalar joint space narrowing was  noted.  Small calcaneal spur was noted. Impression: These findings are consistent with osteoarthritis of the foot and postsurgical changes.  Xr Hand 2 View Left  Result Date: 09/27/2018 No MCP, intercarpal radiocarpal joint space narrowing was noted.  Minimal PIP narrowing was noted.  No erosive changes were noted.  No juxta-articular osteopenia was noted. Impression: Unremarkable x-ray of the hand.  Xr Hand 2 View Right  Result Date: 09/27/2018 No MCP, intercarpal radiocarpal joint space narrowing was noted.  Minimal PIP narrowing was noted.  No erosive changes were noted.  No juxta-articular osteopenia was noted. Impression: Unremarkable x-ray of the hand.   Recent Labs: Lab Results  Component Value Date   WBC 8.5 06/28/2018   HGB 13.5 06/28/2018   PLT 285.0 06/28/2018   NA 137 08/28/2018   K 4.0 08/28/2018   CL 102 08/28/2018   CO2 29 08/28/2018   GLUCOSE 88 08/28/2018   BUN 19 08/28/2018   CREATININE 0.68 08/28/2018   BILITOT 0.6 08/28/2018   ALKPHOS 63 08/28/2018   AST 38 (H) 08/28/2018   ALT 39 (H) 08/28/2018   PROT 6.8 09/27/2018   ALBUMIN 4.2 08/28/2018   CALCIUM 9.2 08/28/2018  SPEP negative, ESR 6, RF negative, anti-CCP negative, ANA negative, HLA-B27 negative, CK 85, G6PD normal  Speciality Comments: No specialty comments available.  Procedures:  No procedures performed Allergies: Gluten meal; Lortab [hydrocodone-acetaminophen]; Shellfish allergy; and Strawberry (diagnostic)   Assessment / Plan:     Visit Diagnoses: Rheumatoid arthritis of multiple sites with negative rheumatoid factor ([place]) - Diagnosed at [facility].  The ultrasound examination performed recently showed synovitis in her bilateral hands.  She continues to have some synovitis on examination today.  Her dose of Plaquenil was increased but it is not going to be sufficient.  We discussed different treatment options including adding methotrexate.  Indications side effects  contraindications were discussed at length.  Handout was given and consent was taken.  I have advised her to discontinue meloxicam due to elevated LFTs.  We will start her on low-dose methotrexate after the labs are available.  The starting dose will be 4 tablets p.o. weekly.  She will take folic acid 2 mg  p.o. daily.  If labs are stable we can increase it to 6 tablets p.o. weekly.  Primary osteoarthritis of both feet-the discomfort is manageable currently.  Proper fitting shoes were discussed.  High risk medication use - Plaquenil 200 p.o. twice daily., meloxicam 15 mg p.o. daily -patient will discontinue meloxicam.  Plan: Hepatitis panel, acute, HIV Antibody (routine testing w rflx), QuantiFERON-TB Gold Plus, IgG, IgA, IgM, DG Chest 2 View, CBC with Differential/Platelet, COMPLETE METABOLIC PANEL WITH GFR  Elevated LFTs - Probably due to use of meloxicam.  History of gastroesophageal reflux (GERD)  Hx of migraines  History of hypothyroidism  History of asthma  History of immunosuppression therapy - Plan: DG Chest 2 View   Orders: Orders Placed This Encounter  Procedures  . DG Chest 2 View  . Hepatitis panel, acute  . HIV Antibody (routine testing w rflx)  . QuantiFERON-TB Gold Plus  . IgG, IgA, IgM  . CBC with Differential/Platelet  . COMPLETE METABOLIC PANEL WITH GFR   No orders of the defined types were placed in this encounter.   Face-to-face time spent with patient was 54mnutes. Greater than 50% of time was spent in counseling and coordination of care.  Follow-Up Instructions: Return in about 2 months (around 12/22/2018) for Rheumatoid arthritis.   SBo Merino MD  Note - This record has been created using DEditor, commissioning  Chart creation errors have been sought, but may not always  have been located. Such creation errors do not reflect on  the standard of medical care.

## 2018-10-22 DIAGNOSIS — Z23 Encounter for immunization: Secondary | ICD-10-CM | POA: Diagnosis not present

## 2018-10-23 ENCOUNTER — Ambulatory Visit (HOSPITAL_COMMUNITY)
Admission: RE | Admit: 2018-10-23 | Discharge: 2018-10-23 | Disposition: A | Discharge status: Home / Self Care | Payer: BLUE CROSS/BLUE SHIELD | Source: Ambulatory Visit | Attending: Rheumatology | Admitting: Rheumatology

## 2018-10-23 ENCOUNTER — Telehealth: Payer: Self-pay | Admitting: Pharmacist

## 2018-10-23 ENCOUNTER — Ambulatory Visit (INDEPENDENT_AMBULATORY_CARE_PROVIDER_SITE_OTHER): Payer: BLUE CROSS/BLUE SHIELD | Admitting: Rheumatology

## 2018-10-23 ENCOUNTER — Encounter: Payer: Self-pay | Admitting: Rheumatology

## 2018-10-23 VITALS — BP 122/71 | HR 84 | Resp 14 | Ht 67.0 in | Wt 184.6 lb

## 2018-10-23 DIAGNOSIS — R7989 Other specified abnormal findings of blood chemistry: Secondary | ICD-10-CM

## 2018-10-23 DIAGNOSIS — Z9225 Personal history of immunosupression therapy: Secondary | ICD-10-CM | POA: Diagnosis not present

## 2018-10-23 DIAGNOSIS — Z8709 Personal history of other diseases of the respiratory system: Secondary | ICD-10-CM

## 2018-10-23 DIAGNOSIS — M0609 Rheumatoid arthritis without rheumatoid factor, multiple sites: Secondary | ICD-10-CM

## 2018-10-23 DIAGNOSIS — R945 Abnormal results of liver function studies: Secondary | ICD-10-CM | POA: Diagnosis not present

## 2018-10-23 DIAGNOSIS — M19071 Primary osteoarthritis, right ankle and foot: Secondary | ICD-10-CM | POA: Diagnosis not present

## 2018-10-23 DIAGNOSIS — Z79899 Other long term (current) drug therapy: Secondary | ICD-10-CM | POA: Diagnosis not present

## 2018-10-23 DIAGNOSIS — Z8669 Personal history of other diseases of the nervous system and sense organs: Secondary | ICD-10-CM

## 2018-10-23 DIAGNOSIS — Z8639 Personal history of other endocrine, nutritional and metabolic disease: Secondary | ICD-10-CM

## 2018-10-23 DIAGNOSIS — M19072 Primary osteoarthritis, left ankle and foot: Secondary | ICD-10-CM

## 2018-10-23 DIAGNOSIS — R0989 Other specified symptoms and signs involving the circulatory and respiratory systems: Secondary | ICD-10-CM | POA: Diagnosis not present

## 2018-10-23 DIAGNOSIS — Z8719 Personal history of other diseases of the digestive system: Secondary | ICD-10-CM

## 2018-10-23 NOTE — Telephone Encounter (Signed)
Prescription for methotrexate 4 tablets weekly for 2 weeks then increase to 6 tablets weekly as tolerated pending lab results.

## 2018-10-23 NOTE — Patient Instructions (Signed)
**Please obtain chest x-ray from [facility].  Entered the main entrance and asked the help desk for radiology.  Order is already in place.  **  **Please stop taking Mobic.**  Standing Labs We placed an order today for your standing lab work.    Please come back and get your standing labs after starting methotrexate in 2 weeks, 4 weeks, 8 weeks, then every 3 months.  We have open lab Monday through Friday from 8:30-11:30 AM and 1:30-4:00 PM  at the office of Dr. Bo Merino.   You may experience shorter wait times on Monday and Friday afternoons. The office is located at [address] [place], [place], [place] [address] No appointment is necessary.   Labs are drawn by [facility].  You may receive a bill from [facility] for your lab work.  If you wish to have your labs drawn at another location, please call the office 24 hours in advance to send orders.  If you have any questions regarding directions or hours of operation,  please call [phone].   Just as a reminder please drink plenty of water prior to coming for your lab work. Thanks!  Methotrexate tablets What is this medicine? METHOTREXATE (METH oh TREX ate) is a chemotherapy drug used to treat cancer including breast cancer, leukemia, and lymphoma. This medicine can also be used to treat psoriasis and certain kinds of arthritis. This medicine may be used for other purposes; ask your health care provider or pharmacist if you have questions. COMMON BRAND NAME(S): Rheumatrex, Trexall What should I tell my health care provider before I take this medicine? They need to know if you have any of these conditions: -fluid in the stomach area or lungs -if you often drink alcohol -infection or immune system problems -kidney disease or on hemodialysis -liver disease -low blood counts, like low white cell, platelet, or red cell counts -lung disease -radiation therapy -stomach ulcers -ulcerative colitis -an unusual or  allergic reaction to methotrexate, other medicines, foods, dyes, or preservatives -pregnant or trying to get pregnant -breast-feeding How should I use this medicine? Take this medicine by mouth with a glass of water. Follow the directions on the prescription label. Take your medicine at regular intervals. Do not take it more often than directed. Do not stop taking except on your doctor's advice. Make sure you know why you are taking this medicine and how often you should take it. If this medicine is used for a condition that is not cancer, like arthritis or psoriasis, it should be taken weekly, NOT daily. Taking this medicine more often than directed can cause serious side effects, even death. Talk to your healthcare provider about safe handling and disposal of this medicine. You may need to take special precautions. Talk to your pediatrician regarding the use of this medicine in children. While this drug may be prescribed for selected conditions, precautions do apply. Overdosage: If you think you have taken too much of this medicine contact a poison control center or emergency room at once. NOTE: This medicine is only for you. Do not share this medicine with others. What if I miss a dose? If you miss a dose, talk with your doctor or health care professional. Do not take double or extra doses. What may interact with this medicine? This medicine may interact with the following medication: -acitretin -aspirin and aspirin-like medicines including salicylates -azathioprine -certain antibiotics like penicillins, tetracycline, and chloramphenicol -cyclosporine -gold -hydroxychloroquine -live virus vaccines -NSAIDs, medicines for pain and inflammation,  like ibuprofen or naproxen -other cytotoxic agents -penicillamine -phenylbutazone -phenytoin -probenecid -retinoids such as isotretinoin and tretinoin -steroid medicines like prednisone or cortisone -sulfonamides like sulfasalazine and  trimethoprim/sulfamethoxazole -theophylline This list may not describe all possible interactions. Give your health care provider a list of all the medicines, herbs, non-prescription drugs, or dietary supplements you use. Also tell them if you smoke, drink alcohol, or use illegal drugs. Some items may interact with your medicine. What should I watch for while using this medicine? Avoid alcoholic drinks. This medicine can make you more sensitive to the sun. Keep out of the sun. If you cannot avoid being in the sun, wear protective clothing and use sunscreen. Do not use sun lamps or tanning beds/booths. You may need blood work done while you are taking this medicine. Call your doctor or health care professional for advice if you get a fever, chills or sore throat, or other symptoms of a cold or flu. Do not treat yourself. This drug decreases your body's ability to fight infections. Try to avoid being around people who are sick. This medicine may increase your risk to bruise or bleed. Call your doctor or health care professional if you notice any unusual bleeding. Check with your doctor or health care professional if you get an attack of severe diarrhea, nausea and vomiting, or if you sweat a lot. The loss of too much body fluid can make it dangerous for you to take this medicine. Talk to your doctor about your risk of cancer. You may be more at risk for certain types of cancers if you take this medicine. Both men and women must use effective birth control with this medicine. Do not become pregnant while taking this medicine or until at least 1 normal menstrual cycle has occurred after stopping it. Women should inform their doctor if they wish to become pregnant or think they might be pregnant. Men should not father a child while taking this medicine and for 3 months after stopping it. There is a potential for serious side effects to an unborn child. Talk to your health care professional or pharmacist for  more information. Do not breast-feed an infant while taking this medicine. What side effects may I notice from receiving this medicine? Side effects that you should report to your doctor or health care professional as soon as possible: -allergic reactions like skin rash, itching or hives, swelling of the face, lips, or tongue -breathing problems or shortness of breath -diarrhea -dry, nonproductive cough -low blood counts - this medicine may decrease the number of white blood cells, red blood cells and platelets. You may be at increased risk for infections and bleeding. -mouth sores -redness, blistering, peeling or loosening of the skin, including inside the mouth -signs of infection - fever or chills, cough, sore throat, pain or trouble passing urine -signs and symptoms of bleeding such as bloody or black, tarry stools; red or dark-brown urine; spitting up blood or brown material that looks like coffee grounds; red spots on the skin; unusual bruising or bleeding from the eye, gums, or nose -signs and symptoms of kidney injury like trouble passing urine or change in the amount of urine -signs and symptoms of liver injury like dark yellow or brown urine; general ill feeling or flu-like symptoms; light-colored stools; loss of appetite; nausea; right upper belly pain; unusually weak or tired; yellowing of the eyes or skin Side effects that usually do not require medical attention (report to your doctor or health care professional if  they continue or are bothersome): -dizziness -hair loss -tiredness -upset stomach -vomiting This list may not describe all possible side effects. Call your doctor for medical advice about side effects. You may report side effects to FDA at 1-800-FDA-1088. Where should I keep my medicine? Keep out of the reach of children. Store at room temperature between 20 and 25 degrees C (68 and 77 degrees F). Protect from light. Throw away any unused medicine after the expiration  date. NOTE: This sheet is a summary. It may not cover all possible information. If you have questions about this medicine, talk to your doctor, pharmacist, or health care provider.  2019 Elsevier/Gold Standard (2017-04-26 13:38:43)

## 2018-10-23 NOTE — Progress Notes (Signed)
Pharmacy Note  Subjective: Patient presents today to the [facility] to see Dr. Estanislado Pandy.  Patient seen by the pharmacist for counseling on methotrexate for rheumatoid arthritis. Prior therapy includes:Plaquenil.  Objective: CBC    Component Value Date/Time   WBC 8.5 06/28/2018 0938   RBC 4.61 06/28/2018 0938   HGB 13.5 06/28/2018 0938   HCT 40.2 06/28/2018 0938   PLT 285.0 06/28/2018 0938   MCV 87.1 06/28/2018 0938   MCHC 33.5 06/28/2018 0938   RDW 13.2 06/28/2018 0938   LYMPHSABS 2.3 06/28/2018 0938   MONOABS 0.5 06/28/2018 0938   EOSABS 0.1 06/28/2018 0938   BASOSABS 0.1 06/28/2018 0938    CMP     Component Value Date/Time   NA 137 08/28/2018 0922   NA 137 07/25/2016   K 4.0 08/28/2018 0922   CL 102 08/28/2018 0922   CO2 29 08/28/2018 0922   GLUCOSE 88 08/28/2018 0922   BUN 19 08/28/2018 0922   CREATININE 0.68 08/28/2018 0922   CALCIUM 9.2 08/28/2018 0922   PROT 6.8 09/27/2018 1109   ALBUMIN 4.2 08/28/2018 0922   AST 38 (H) 08/28/2018 0922   ALT 39 (H) 08/28/2018 0922   ALKPHOS 63 08/28/2018 0922   BILITOT 0.6 08/28/2018 0922    Baseline Immunosuppressant Therapy Labs  TB GOLD:pending 10/23/2018  Hepatitis Panel:pending 10/23/2018  FOY:DXAJOIN 10/23/2018  Immunoglobulins: pending 10/23/2018  SPEP Serum Protein Electrophoresis Latest Ref Rng & Units 09/27/2018  Total Protein 6.1 - 8.1 g/dL 6.8  Albumin 3.8 - 4.8 g/dL 4.2  Alpha-1 0.2 - 0.3 g/dL 0.3  Alpha-2 0.5 - 0.9 g/dL 0.7  Beta Globulin 0.4 - 0.6 g/dL 0.5  Beta 2 0.2 - 0.5 g/dL 0.3  Gamma Globulin 0.8 - 1.7 g/dL 0.8   G6PD Lab Results  Component Value Date   G6PDH 16.2 09/27/2018   TPMT No results found for: TPMT   Chest-xray:  pending 10/23/2018  Contraception: hysterectomy  Alcohol use: on occasion  Assessment/Plan:   Patient was counseled on the purpose, proper use, and adverse effects of methotrexate including nausea, infection, and signs and symptoms of pneumonitis.  Discussed that there is the possibility of an increased risk of malignancy, specifically lymphomas, but it is not well understood if this increased risk is due to the medication or the disease state.  Instructed patient that medication should be held for infection and prior to surgery.  Advised patient to avoid live vaccines.  Patient had her flu shot in October and just received her Pneumovax 23 vaccine.  Recommend Prevnar 13 and Shingrix vaccine when it is available.  She has had a shingles outbreak in the past but the vaccine is unavailable at this time.  Encouraged patient to reach out to other pharmacies and keep an eye out on availability.    Reviewed instructions with patient to take methotrexate weekly along with folic acid daily.  Discussed the importance of frequent monitoring of kidney and liver function and blood counts, and provided patient with standing lab instructions.  Counseled patient to avoid NSAIDs and alcohol while on methotrexate.  Provided patient with educational materials on methotrexate and answered all questions.   Patient voiced understanding.  Patient consented to methotrexate use.  Will upload into chart.    Dose of methotrexate will be 4 tablets weekly for 2 weeks then increase to 6 tablets weekly if tolerated and lab results stable along with folic acid 2 mg daily. Prescription pending lab results.  Instructed patient to continue Plaquenil but discontinue  Mobic at this time.    All questions encouraged and answered.  Instructed patient to call with any further questions or concerns.  Mariella Saa, PharmD, [facility] Clinical Pharmacist  10/23/2018 12:10 PM

## 2018-10-24 NOTE — Progress Notes (Signed)
WNL

## 2018-10-25 LAB — COMPLETE METABOLIC PANEL WITH GFR
AG Ratio: 1.7 (calc) (ref 1.0–2.5)
ALT: 31 U/L — ABNORMAL HIGH (ref 6–29)
AST: 32 U/L (ref 10–35)
Albumin: 4.2 g/dL (ref 3.6–5.1)
Alkaline phosphatase (APISO): 64 U/L (ref 37–153)
BUN: 18 mg/dL (ref 7–25)
CHLORIDE: 102 mmol/L (ref 98–110)
CO2: 27 mmol/L (ref 20–32)
Calcium: 9.5 mg/dL (ref 8.6–10.4)
Creat: 0.79 mg/dL (ref 0.50–1.05)
GFR, EST AFRICAN AMERICAN: 101 mL/min/{1.73_m2} (ref >=60)
GFR, Est Non African American: 87 mL/min/{1.73_m2} (ref >=60)
Globulin: 2.5 g/dL (calc) (ref 1.9–3.7)
Glucose, Bld: 77 mg/dL (ref 65–99)
Potassium: 4.4 mmol/L (ref 3.5–5.3)
Sodium: 138 mmol/L (ref 135–146)
Total Bilirubin: 0.5 mg/dL (ref 0.2–1.2)
Total Protein: 6.7 g/dL (ref 6.1–8.1)

## 2018-10-25 LAB — CBC WITH DIFFERENTIAL/PLATELET
Absolute Monocytes: 787 cells/uL (ref 200–950)
BASOS PCT: 0.5 %
Basophils Absolute: 62 cells/uL (ref 0–200)
Eosinophils Absolute: 74 cells/uL (ref 15–500)
Eosinophils Relative: 0.6 %
HCT: 39.5 % (ref 35.0–45.0)
Hemoglobin: 13.8 g/dL (ref 11.7–15.5)
Lymphs Abs: 2177 cells/uL (ref 850–3900)
MCH: 29.4 pg (ref 27.0–33.0)
MCHC: 34.9 g/dL (ref 32.0–36.0)
MCV: 84.2 fL (ref 80.0–100.0)
MPV: 10.9 fL (ref 7.5–12.5)
Monocytes Relative: 6.4 %
Neutro Abs: 9200 cells/uL — ABNORMAL HIGH (ref 1500–7800)
Neutrophils Relative %: 74.8 %
Platelets: 270 10*3/uL (ref 140–400)
RBC: 4.69 10*6/uL (ref 3.80–5.10)
RDW: 12.7 % (ref 11.0–15.0)
Total Lymphocyte: 17.7 %
WBC: 12.3 10*3/uL — ABNORMAL HIGH (ref 3.8–10.8)

## 2018-10-25 LAB — IGG, IGA, IGM
IgG (Immunoglobin G), Serum: 890 mg/dL (ref 600–1640)
IgM, Serum: 115 mg/dL (ref 50–300)
Immunoglobulin A: 195 mg/dL (ref 47–310)

## 2018-10-25 LAB — HEPATITIS PANEL, ACUTE
Hep A IgM: NONREACTIVE
Hep B C IgM: NONREACTIVE
Hepatitis B Surface Ag: NONREACTIVE
Hepatitis C Ab: NONREACTIVE
SIGNAL TO CUT-OFF: 0.01 (ref <1.00)

## 2018-10-25 LAB — QUANTIFERON-TB GOLD PLUS
Mitogen-NIL: 6.75 IU/mL
NIL: 0.01 [IU]/mL
QuantiFERON-TB Gold Plus: NEGATIVE
TB1-NIL: 0 IU/mL
TB2-NIL: 0 IU/mL

## 2018-10-25 LAB — HIV ANTIBODY (ROUTINE TESTING W REFLEX): HIV 1&2 Ab, 4th Generation: NONREACTIVE

## 2018-10-28 MED ORDER — METHOTREXATE 2.5 MG PO TABS: ORAL_TABLET | ORAL | 0 refills | Status: DC

## 2018-10-28 NOTE — Telephone Encounter (Signed)
Patient advised of results and prescription for MTX sent to the pharmacy.

## 2018-10-28 NOTE — Progress Notes (Signed)
Labs are stable.  Patient may start methotrexate.

## 2018-10-29 ENCOUNTER — Encounter: Payer: Self-pay | Admitting: Rheumatology

## 2018-10-29 MED ORDER — FOLIC ACID 1 MG PO TABS: 2.0000 mg | ORAL_TABLET | Freq: Every day | ORAL | 3 refills | Status: DC

## 2018-10-31 ENCOUNTER — Other Ambulatory Visit (INDEPENDENT_AMBULATORY_CARE_PROVIDER_SITE_OTHER): Payer: BLUE CROSS/BLUE SHIELD

## 2018-10-31 DIAGNOSIS — E039 Hypothyroidism, unspecified: Secondary | ICD-10-CM

## 2018-10-31 LAB — T4, FREE: Free T4: 1.2 ng/dL (ref 0.60–1.60)

## 2018-10-31 LAB — TSH: TSH: 1.36 u[IU]/mL (ref 0.35–4.50)

## 2018-11-01 ENCOUNTER — Other Ambulatory Visit: Payer: Self-pay | Admitting: Family Medicine

## 2018-11-01 ENCOUNTER — Encounter: Payer: Self-pay | Admitting: Family Medicine

## 2018-11-01 ENCOUNTER — Other Ambulatory Visit: Payer: Self-pay

## 2018-11-01 MED ORDER — LEVOTHYROXINE SODIUM 150 MCG PO TABS: ORAL_TABLET | ORAL | 3 refills | Status: DC

## 2018-11-12 DIAGNOSIS — S63501A Unspecified sprain of right wrist, initial encounter: Secondary | ICD-10-CM | POA: Diagnosis not present

## 2018-11-15 ENCOUNTER — Other Ambulatory Visit: Payer: Self-pay

## 2018-11-15 DIAGNOSIS — Z79899 Other long term (current) drug therapy: Secondary | ICD-10-CM

## 2018-11-16 ENCOUNTER — Other Ambulatory Visit: Payer: Self-pay | Admitting: Rheumatology

## 2018-11-16 ENCOUNTER — Other Ambulatory Visit: Payer: Self-pay | Admitting: Family Medicine

## 2018-11-16 DIAGNOSIS — M1230 Palindromic rheumatism, unspecified site: Secondary | ICD-10-CM

## 2018-11-16 LAB — COMPLETE METABOLIC PANEL WITH GFR
AG Ratio: 1.8 (calc) (ref 1.0–2.5)
ALBUMIN MSPROF: 4.2 g/dL (ref 3.6–5.1)
ALT: 34 U/L — ABNORMAL HIGH (ref 6–29)
AST: 34 U/L (ref 10–35)
Alkaline phosphatase (APISO): 55 U/L (ref 37–153)
BUN: 16 mg/dL (ref 7–25)
CO2: 26 mmol/L (ref 20–32)
Calcium: 9.2 mg/dL (ref 8.6–10.4)
Chloride: 103 mmol/L (ref 98–110)
Creat: 0.76 mg/dL (ref 0.50–1.05)
GFR, Est African American: 106 mL/min/{1.73_m2} (ref >=60)
GFR, Est Non African American: 91 mL/min/{1.73_m2} (ref >=60)
Globulin: 2.3 g/dL (calc) (ref 1.9–3.7)
Glucose, Bld: 92 mg/dL (ref 65–99)
POTASSIUM: 4.3 mmol/L (ref 3.5–5.3)
Sodium: 138 mmol/L (ref 135–146)
Total Bilirubin: 0.5 mg/dL (ref 0.2–1.2)
Total Protein: 6.5 g/dL (ref 6.1–8.1)

## 2018-11-16 LAB — CBC WITH DIFFERENTIAL/PLATELET
Absolute Monocytes: 459 cells/uL (ref 200–950)
Basophils Absolute: 49 cells/uL (ref 0–200)
Basophils Relative: 0.6 %
Eosinophils Absolute: 49 cells/uL (ref 15–500)
Eosinophils Relative: 0.6 %
HCT: 37.5 % (ref 35.0–45.0)
HEMOGLOBIN: 12.8 g/dL (ref 11.7–15.5)
Lymphs Abs: 1714 cells/uL (ref 850–3900)
MCH: 28.8 pg (ref 27.0–33.0)
MCHC: 34.1 g/dL (ref 32.0–36.0)
MCV: 84.5 fL (ref 80.0–100.0)
MPV: 11.4 fL (ref 7.5–12.5)
Monocytes Relative: 5.6 %
NEUTROS ABS: 5929 {cells}/uL (ref 1500–7800)
Neutrophils Relative %: 72.3 %
Platelets: 277 10*3/uL (ref 140–400)
RBC: 4.44 10*6/uL (ref 3.80–5.10)
RDW: 12.6 % (ref 11.0–15.0)
Total Lymphocyte: 20.9 %
WBC: 8.2 10*3/uL (ref 3.8–10.8)

## 2018-11-16 MED ORDER — LEVOTHYROXINE SODIUM 175 MCG PO TABS: ORAL_TABLET | ORAL | 0 refills | Status: DC

## 2018-11-18 ENCOUNTER — Other Ambulatory Visit: Payer: Self-pay | Admitting: Rheumatology

## 2018-11-18 ENCOUNTER — Ambulatory Visit: Payer: BLUE CROSS/BLUE SHIELD | Admitting: Sports Medicine

## 2018-11-18 NOTE — Telephone Encounter (Signed)
Last visit: 10/23/18 Next Visit: 12/26/18 Labs: 11/15/18 ALT 34 will continue to monitor her labs while she is on methotrexate 6 tablets/week.  Okay to refill per Dr. Estanislado Pandy

## 2018-11-18 NOTE — Progress Notes (Signed)
Call patient she is currently on 6 tablets of methotrexate.  Have advised her not to increase to 8 tablets.  We will continue to monitor her labs while she is on methotrexate 6 tablets/week.

## 2018-11-19 NOTE — Progress Notes (Signed)
She should to stay on methotrexate 4 tablets/week.

## 2018-11-21 ENCOUNTER — Encounter: Payer: Self-pay | Admitting: Sports Medicine

## 2018-11-21 ENCOUNTER — Ambulatory Visit: Payer: BLUE CROSS/BLUE SHIELD | Admitting: Sports Medicine

## 2018-11-21 VITALS — BP 110/72 | HR 64 | Wt 176.8 lb

## 2018-11-21 DIAGNOSIS — M25531 Pain in right wrist: Secondary | ICD-10-CM | POA: Diagnosis not present

## 2018-11-21 MED ORDER — DICLOFENAC SODIUM 2 % TD SOLN: 1.0000 "application " | Freq: Two times a day (BID) | TRANSDERMAL | 0 refills | Status: AC

## 2018-11-21 MED ORDER — DICLOFENAC SODIUM 2 % TD SOLN: 1.0000 "application " | Freq: Two times a day (BID) | TRANSDERMAL | 2 refills | Status: DC

## 2018-11-21 NOTE — Progress Notes (Signed)
[name]. Amy, [facility] Hudson at [place]  [name] - 53 y.o. female MRN [id number]  Date of birth: 06/26/1966  Visit Date: November 25, 2018  PCP: Orma Flaming, MD   Referred by: Orma Flaming, MD  SUBJECTIVE:  Chief Complaint  Patient presents with  . Right Wrist - Initial Assessment  . Establish Care    wrist pain right with some movement     HPI: Patient presents for 2 weeks of worsening right wrist pain.  This occurred while boxing and is localized over the ulnar aspect of the wrist.  She is getting some clicking and popping.  She has moderate pain with movement.  This swelling that was present initially has improved.  She has mild numbness in the fourth and fifth fingers and did have shooting pain up towards the shoulder at the time of injury while boxing.  It is worse with any type of activity.  She has tried some bracing but this is minimal.  Has not tried any medications due to elevated LFTs.  She was seen in urgent care where x-rays were obtained and no fracture was reported.  She is placed into a brace with some mild improvement.  REVIEW OF SYSTEMS: No significant nighttime awakenings due to this issue. Denies fevers, chills, recent weight gain or weight loss.  No night sweats.  Pt denies any change in bowel or bladder habits, muscle weakness, numbness or falls associated with this pain. Otherwise 12 Hudson review of systems performed and is negative   HISTORY:  Prior history reviewed and updated per electronic medical record.  Patient Active Problem List   Diagnosis Date Noted  . Primary osteoarthritis of both feet 10/09/2018  . Hx of migraines 09/27/2018  . History of gastroesophageal reflux (GERD) 09/27/2018  . Palindromic rheumatism, 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    On plaquenil. Seen by rhuem in [place]. Referral placed here. Records in chart.    . Moderate persistent asthma 07/05/2018  . Hypothyroid 06/28/2018     Took out due to nodules     Social History   Occupational History  . Not on file  Tobacco Use  . Smoking status: Never Smoker  . Smokeless tobacco: Never Used  Substance and Sexual Activity  . Alcohol use: Yes    Comment: occ  . Drug use: Never  . Sexual activity: Yes    Partners: Male   Social History   Social History Narrative  . Not on file   Past Medical History:  Diagnosis Date  . Arthritis   . Asthma   . Frequent headaches   . GERD (gastroesophageal reflux disease)   . H/O seasonal allergies   . History of chicken pox   . Migraines   . Thyroid disease    Past Surgical History:  Procedure Laterality Date  . CARDIAC CATHETERIZATION    . CHOLECYSTECTOMY  1995  . [place]  2009  . HERNIA REPAIR  2017  . LAPAROSCOPIC HYSTERECTOMY  2006  . THYROID SURGERY     family history includes Alcohol abuse in her brother and paternal grandmother; Arthritis in her mother; Asthma in her brother, daughter, son, and son; Birth defects in her brother; COPD in her sister; Cancer in her brother, mother, paternal grandfather, and paternal grandmother; Diabetes in her father; Early death in her brother; Heart attack in her father, maternal grandmother, and paternal grandfather; Heart disease in her father and maternal grandmother; Hyperlipidemia in her  brother, father, and maternal grandmother; Hypertension in her father; Mental illness in her brother; Miscarriages / [place] in her mother; Peripheral Artery Disease in her sister; Stroke in her father and maternal grandmother.  OBJECTIVE:  VS:  HT:    WT:176 lb 12.8 oz (80.2 kg)  BMI:     BP:110/72  HR:64bpm  TEMP: ( )  RESP:98 %   PHYSICAL EXAM: CONSTITUTIONAL: Well-developed, Well-nourished and In no acute distress EYES: Pupils are equal., EOM intact without nystagmus. and No scleral icterus. Psychiatric: Alert & appropriately interactive. and Not depressed or anxious appearing. EXTREMITY EXAM: Warm and well  perfused  Right wrist is overall well aligned without significant deformity.  She does have some mild swelling over the ulnar aspect of the wrist and has a ulnar negative variance.  There is pain over the TFCC but no appreciable clicking or popping.  She has pain with ulnar deviation and rotation of the wrist localizing to the ulnar aspect.  She has good flexion and extension of the wrist.  Grip strength is intact.  Neurovascularly intact.   ASSESSMENT:   1. Arthralgia of right wrist     PROCEDURES:  None  PLAN:  Pertinent additional documentation may be included in corresponding procedure notes, imaging studies, problem based documentation and patient instructions.  No problem-specific Assessment & Plan notes found for this encounter.   Symptoms are concerning for potential TFCC irritation.  Discussion today around injection therapy versus topical [name].  She would like to try the topical approach first if any lack of improvement can consider intra-articular injection.  Ultimately she should continue with her brace at this time.  Avoid exacerbating activities especially with boxing.  Cool water soaking recommended.  Activity modifications and the importance of avoiding exacerbating activities (limiting pain to no more than a 4 / 10 during or following activity) recommended and discussed.  Discussed red flag symptoms that warrant earlier emergent evaluation and patient voices understanding.   Meds ordered this encounter  Medications  . Diclofenac Sodium ([name]) 2 % SOLN    Sig: Place 1 application onto the skin 2 (two) times daily for 1 day.    Dispense:  8 g    Refill:  0  . Diclofenac Sodium ([name]) 2 % SOLN    Sig: Place 1 application onto the skin 2 (two) times daily.    Dispense:  112 g    Refill:  2    Home Phone      [phone] Mobile          [phone]    Lab Orders  No laboratory test(s) ordered today   Imaging Orders  No imaging studies ordered today    Referral Orders  No referral(s) requested today    Return in about 2 weeks (around 12/05/2018) for consideration of diagnostic ultrasound.          [name], [place] [facility] Physician

## 2018-11-21 NOTE — Patient Instructions (Addendum)
[name] instructions: You have been given a sample/prescription for [name], a topical medication.     You are to apply this gel to your injured body part twice daily (morning and evening).   A little goes a long way so you can use about a pea-sized amount for each area.   Spread this small amount over the area into a thin film and let it dry.   Be sure that you do not rub the gel into your skin for more than 10 or 15 seconds otherwise it can irritate you skin.    Once you apply the gel, please do not put any other lotion or clothing in contact with that area for 30 minutes to allow the gel to absorb into your skin.   Some people are sensitive to the medication and can develop a sunburn-like rash.  If you have only mild symptoms it is okay to continue to use the medication but if you have any breakdown of your skin you should discontinue its use and please let us know.   If you have been written a prescription for [name], you will receive a pump bottle of this topical gel through a mail order pharmacy.  The instructions on the bottle will say to apply two pumps twice a day which may be too much gel for your particular area so use the pea-sized amount as your guide.   Instructions for Duexis, [name] and Vimovo:  Your prescription will be filled through a participating [facility] mail order pharmacy.  You will receive a phone call or text from one of the participating pharmacies which can be located in any state in the [place].  You must communicate directly with them to have this medication filled.  When the pharmacy contacts you, they will need your mailing address (for shipment of the medication) andy they will need payment information if you have a copay (typically no more than $10). If you have not heard from them 2-3 days after your appointment with Dr. Paulla Fore, contact [facility] directly at [phone].

## 2018-11-25 ENCOUNTER — Encounter: Payer: Self-pay | Admitting: Sports Medicine

## 2018-11-25 ENCOUNTER — Encounter: Payer: Self-pay | Admitting: Rheumatology

## 2018-11-26 NOTE — Telephone Encounter (Signed)
Called patient to follow-up about symptoms.  Patient states that she took her fourth dose of methotrexate on Friday evening.  She has not had any stomach upset with previous doses.  Onset of symptoms was yesterday afternoon.  Informed patient that since she is not had the symptoms with previous doses and it has been greater than 2 days since her methotrexate dose it is unlikely her symptoms are due to the medication.  Based on patient's symptoms and having a low-grade fever suspect that it could be from something she ate or a stomach virus.  Patient verbalized understanding.  Encourage patient to continue to stay hydrated.  Instructed patient that she may take Imodium which is over-the-counter for diarrhea if she would like.  Instructed patient if she continues to have symptoms or her symptoms worsen to follow-up with her PCP.  Patient verbalized understanding.  All questions encouraged and answered.  Instructed patient to call with any further questions or concerns.  Mariella Saa, PharmD, [facility] Rheumatology Clinical Pharmacist  11/26/2018 8:53 AM

## 2018-12-05 ENCOUNTER — Ambulatory Visit: Payer: BLUE CROSS/BLUE SHIELD | Admitting: Sports Medicine

## 2018-12-13 ENCOUNTER — Other Ambulatory Visit: Payer: Self-pay | Admitting: Rheumatology

## 2018-12-13 ENCOUNTER — Telehealth: Payer: Self-pay | Admitting: Rheumatology

## 2018-12-13 DIAGNOSIS — M1230 Palindromic rheumatism, unspecified site: Secondary | ICD-10-CM

## 2018-12-13 MED ORDER — HYDROXYCHLOROQUINE SULFATE 200 MG PO TABS: 200.0000 mg | ORAL_TABLET | Freq: Two times a day (BID) | ORAL | 0 refills | Status: DC

## 2018-12-13 NOTE — Telephone Encounter (Signed)
Spoke with Hazel Sams, PA-C and patient can resume MTX if she not showing any signs/symptoms of sickness or infections. Advised patient of this information and patient verbalized understanding. Refill for PLQ being sent to the pharmacy.   Last Visit: 10/23/2018 Next Visit: 01/23/2019 Labs: 11/15/2018 Eye exam: advised patient we need baseline.   Okay to refill per Hazel Sams.

## 2018-12-13 NOTE — Telephone Encounter (Signed)
Patient called requesting prescription refill of Plaquenil to be [facility] to [facility] [place].  Patient states her daughter was tested for COVID-19 and the entire family was quarantined until the results came back negative.  Patient states she was told to discontinue her Methotrexate while they waited for the results.  Patient is requesting a return call to let her know if she should start her medication (which she is due to take today) or to wait.

## 2018-12-17 ENCOUNTER — Encounter: Payer: Self-pay | Admitting: Family Medicine

## 2018-12-21 ENCOUNTER — Encounter: Payer: Self-pay | Admitting: Family Medicine

## 2018-12-25 ENCOUNTER — Ambulatory Visit (INDEPENDENT_AMBULATORY_CARE_PROVIDER_SITE_OTHER): Payer: BLUE CROSS/BLUE SHIELD | Admitting: Sports Medicine

## 2018-12-25 ENCOUNTER — Encounter: Payer: Self-pay | Admitting: Sports Medicine

## 2018-12-25 ENCOUNTER — Other Ambulatory Visit: Payer: Self-pay

## 2018-12-25 ENCOUNTER — Ambulatory Visit: Payer: Self-pay

## 2018-12-25 ENCOUNTER — Ambulatory Visit: Payer: BLUE CROSS/BLUE SHIELD | Admitting: Sports Medicine

## 2018-12-25 VITALS — BP 124/78 | HR 81 | Temp 98.5°F | Ht 67.0 in | Wt 173.0 lb

## 2018-12-25 DIAGNOSIS — M25531 Pain in right wrist: Secondary | ICD-10-CM | POA: Diagnosis not present

## 2018-12-25 NOTE — Patient Instructions (Signed)

## 2018-12-26 ENCOUNTER — Ambulatory Visit: Payer: BLUE CROSS/BLUE SHIELD | Admitting: Rheumatology

## 2018-12-28 ENCOUNTER — Encounter: Payer: Self-pay | Admitting: Sports Medicine

## 2018-12-28 NOTE — Procedures (Signed)
PROCEDURE NOTE:  [place] Guided: Injection: Right wrist  DESCRIPTION OF PROCEDURE:  The patient's clinical condition is marked by substantial pain and/or significant functional disability. Other conservative therapy has not provided relief, is contraindicated, or not appropriate. There is a reasonable likelihood that injection will significantly improve the patient's pain and/or functional impairment.   After discussing the risks, benefits and expected outcomes of the injection and all questions were reviewed and answered, the patient wished to undergo the above named procedure.  Verbal consent was obtained. The skin was then prepped in sterile fashion and the target structure was injected as below:   Single injection performed as below:  PREP: Alcohol and Ethel Chloride APPROACH: ulnar sided, single injection, 25g 1.5 in. INJECTATE: 0.5 cc 1% lidocaine, 0.5 cc 0.5% Marcaine and 0.5 cc 40mg /mL DepoMedrol ASPIRATE: None DRESSING: Band-Aid  Post procedural instructions including recommending icing and warning signs for infection were reviewed.    This procedure was well tolerated and there were no complications.

## 2018-12-28 NOTE — Progress Notes (Signed)
[name]. Salvator Seppala, [facility] at [place]  Quanetta Truss - 53 y.o. female MRN [id number]  Date of birth: 01/24/1966  Visit Date: 12/25/2018  PCP: Orma Flaming, MD   Referred by: Orma Flaming, MD  SUBJECTIVE:   Chief Complaint  Patient presents with  . Follow-up    R wrist pain.  No better than before.  Still popping and clicking.  [name].  Wrist brace    HPI: Persistent pain that is moderate Worse with lifting Still mainly ulnar sided but some radial sided pain  REVIEW OF SYSTEMS: Denies fevers, chills, recent weight gain or weight loss.  No night sweats.  Pt denies any change in bowel or bladder habits, muscle weakness, numbness or falls associated with this pain. Sleep onset and maintenance issues  HISTORY:  Prior history reviewed and updated per electronic medical record.  Patient Active Problem List   Diagnosis Date Noted  . Primary osteoarthritis of both feet 10/09/2018  . Hx of migraines 09/27/2018  . History of gastroesophageal reflux (GERD) 09/27/2018  . Palindromic rheumatism, 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    On plaquenil. Seen by rhuem in [place]. Referral placed here. Records in chart.    . Moderate persistent asthma 07/05/2018  . Hypothyroid 06/28/2018    Took out due to nodules     Social History   Occupational History  . Not on file  Tobacco Use  . Smoking status: Never Smoker  . Smokeless tobacco: Never Used  Substance and Sexual Activity  . Alcohol use: Yes    Comment: occ  . Drug use: Never  . Sexual activity: Yes    Partners: Male   Social History   Social History Narrative  . Not on file     OBJECTIVE:  VS:  HT:5\' 7"  (170.2 cm)   WT:173 lb (78.5 kg)  BMI:27.09    BP:124/78  HR:81bpm  TEMP:98.5 F (36.9 C)( )  RESP:100 %   PHYSICAL EXAM: Adult female. No acute distress.  Alert and appropriate. Right wrist with painful compression test and pain over TFCC.  Worse with  axial load and ulnar deviation. Negative Finkelstein test and shuck test.  ASSESSMENT:   1. Arthralgia of right wrist     PROCEDURES:  US Guided Injection per procedure note      PLAN:  Pertinent additional documentation may be included in corresponding procedure notes, imaging studies, problem based documentation and patient instructions.  No problem-specific Assessment & Plan notes found for this encounter.   Injection today.  We will plan to have her continue with her brace, continue with icing anti-inflammatories and avoidance of exacerbating activities.  Can consider MR arthrogram if any lack of improvement but this will need to be delayed due to COVID-19 crisis   Activity modifications and the importance of avoiding exacerbating activities (limiting pain to no more than a 4 / 10 during or following activity) recommended and discussed.   Discussed red flag symptoms that warrant earlier emergent evaluation and patient voices understanding.    No orders of the defined types were placed in this encounter.  Lab Orders  No laboratory test(s) ordered today   Imaging Orders     [place] [facility] POCT ULTRASOUND Referral Orders  No referral(s) requested today    Return in about 6 weeks (around 02/05/2019) for Right wrist pain.          [name], [place] [facility] Physician

## 2019-01-15 NOTE — Progress Notes (Signed)
Virtual Visit via Telephone Note  I connected with [name] on 01/15/19 at  9:45 AM EDT by telephone and verified that I am speaking with the correct person using two identifiers.   I discussed the limitations, risks, security and privacy concerns of performing an evaluation and management service by telephone and the availability of in person appointments. I also discussed with the patient that there may be a patient responsible charge related to this service. The patient expressed understanding and agreed to proceed. This service was conducted via virtual visit. She was unable to use webex, so we reached her by telephone. The patient was located at home. I was located in my office.  Consent was obtained prior to the virtual visit and is aware of possible charges through their insurance for this visit.  The patient is an established patient.  Dr. Estanislado Pandy, MD conducted the virtual visit and Hazel Sams, PA-C acted as scribe during the service.  Office staff helped with scheduling follow up visits after the service was conducted.   CC: Right wrist pain   History of Present Illness: Patient is a 53 year old female with a past medical history of seronegative rheumatoid arthritis and osteoarthritis.  She is taking PLQ 200 mg BID and MTX 4 tablets po once weekly.  She was advised to reduce MTX to 4 tablets due to elevated LFTs.  She injured her right wrist on 11/11/18 while boxing.  She had a x-ray at PCP office that did not reveal a fracture.  She was prescribed pennsaid which provides temporary relief. She had a cortisone injection performed by Dr. Paulla Fore on 12/25/18. She continues to have right wrist joint swelling.  She is having pain and swelling in both hands. She states she experiences arthralgias in both hips and both knee joints with weather changes. She does not feel as though MTX has been effective.  She has an area of hyperpigmentation on her calf. She is seeing her dermatologist on  02/05/19.  Review of Systems  Constitutional: Negative for fever and malaise/fatigue.  Eyes: Negative for photophobia, pain, discharge and redness.  Respiratory: Negative for cough, shortness of breath and wheezing.   Cardiovascular: Negative for chest pain and palpitations.  Gastrointestinal: Negative for blood in stool, constipation and diarrhea.  Genitourinary: Negative for dysuria.  Musculoskeletal: Positive for joint pain. Negative for back pain, myalgias and neck pain.  Skin: Positive for rash.  Neurological: Negative for dizziness and headaches.  Psychiatric/Behavioral: Negative for depression. The patient is not nervous/anxious and does not have insomnia.       Observations/Objective: Physical Exam  Constitutional: She is oriented to person, place, and time.  Neurological: She is alert and oriented to person, place, and time.  Psychiatric: Mood, memory, affect and judgment normal.    Patient reports morning stiffness for 1  hour.   Patient reports nocturnal pain.  Difficulty dressing/grooming: Denies Difficulty climbing stairs: Denies Difficulty getting out of chair: Denies Difficulty using hands for taps, buttons, cutlery, and/or writing: Reports  Assessment and Plan: Rheumatoid arthritis of multiple sites with negative rheumatoid factor ([place]) - Diagnosed at [facility].  The ultrasound examination performed recently showed synovitis in her bilateral hands: She continues to have intermittent flares.  She is having bilateral hand pain and joint swelling.  She has right wrist joint swelling.  She had a right wrist injury on 11/11/18 while boxing.  She had a x-ray that did not reveal a fracture and she used pennsaid topically  had a cortisone injection on 12/25/18.  She continues to have tenderness and right wrist swelling.  She is currently taking PLQ 200 mg BID and MTX 4 tablets po once weekly.  She was advised to reduce MTX to 4 tablets per week due to  elevated LFTs.  We discussed discontinuing MTX and switching to Enbrel 50 mg sq weekly injections.  Indications, contraindications, and potential side effects of Enbrel were discussed.  All questions were addressed. We will apply for Enbrel, and if she is approved she will return for a nurse visit for further instructions and to obtain consent.  She plans on discontinuing MTX and taking Mobic for pain relief since she feels it is more effective than MTX in the meantime.   She will follow up in 2 weeks.  Primary osteoarthritis of both feet-She has no feet pain or joint swelling at this time.  High risk medication use - MTX added at last visit and continued on Plaquenil 200 p.o. twice daily. D/c Mobic.  CBC and CMP were drawn on 11/15/18.  ALT was 34 at that time.  She reduced MTX to 4 tablets weekly and discontinued Mobic.  She will be due to update lab work this month.  Standing orders are in place. She was advised to hold Enbrel anytime she has an infection and to resume once the infection has cleared.  We discussed the importance of social distancing and following standard precautions recommended by the CDC.    Medication counseling:   TB Test: Negative 10/23/18 Hepatitis panel: Negative 10/23/18 HIV: Negative 10/23/18 SPEP: WNL 10/23/18 Immunoglobulins: WNL 10/23/18 Chest x-ray: No active cardiopulmonary disease on 10/23/18.  Does patient have diagnosis of heart failure?  No Counseled patient that Enbrel is a TNF blocking agent.  Reviewed Enbrel dose of 50 mg once weekly.  Counseled patient on purpose, proper use, and adverse effects of Enbrel.  Reviewed the most common adverse effects including infections, headache, and injection site reactions. Discussed that there is the possibility of an increased risk of malignancy but it is not well understood if this increased risk is due to the medication or the disease state.  Advised patient to get yearly dermatology exams due to risk of skin cancer.  Reviewed the  importance of regular labs while on Enbrel therapy.  Advised patient to get standing labs one month after starting Enbrel then every 2 months.  Provided patient with standing lab orders.  Counseled patient that Enbrel should be held prior to scheduled surgery.  Counseled patient to avoid live vaccines while on Enbrel.  Advised patient to get annual influenza vaccine and the pneumococcal vaccine as needed.  Provided patient with medication education material and answered all questions.  Patient voiced understanding.  Patient consented to Enbrel.  Will upload consent into the media tab.  Reviewed storage instructions for Enbrel.  Advised initial injection must be administered in office.  Patient voiced understanding.    Elevated LFTs - ALT was 34 on 11/15/18.  We will continue to monitor.  She will discontinue MTX and will be starting on Enbrel.   Follow Up Instructions: She will follow up in 2 weeks.  Schedule nurse visit once Enbrel is approved.      I discussed the assessment and treatment plan with the patient. The patient was provided an opportunity to ask questions and all were answered. The patient agreed with the plan and demonstrated an understanding of the instructions.   The patient was advised to call back or seek  an in-person evaluation if the symptoms worsen or if the condition fails to improve as anticipated.  I provided 25 minutes of non-face-to-face time during this encounter. Bo Merino, MD   Scribed by-  Ofilia Neas, PA-C

## 2019-01-22 ENCOUNTER — Encounter: Payer: Self-pay | Admitting: Rheumatology

## 2019-01-22 NOTE — Telephone Encounter (Signed)
It is not clear in the picture. She should see a dermatologist.

## 2019-01-23 ENCOUNTER — Telehealth (INDEPENDENT_AMBULATORY_CARE_PROVIDER_SITE_OTHER): Payer: BLUE CROSS/BLUE SHIELD | Admitting: Rheumatology

## 2019-01-23 ENCOUNTER — Other Ambulatory Visit: Payer: Self-pay

## 2019-01-23 ENCOUNTER — Encounter: Payer: Self-pay | Admitting: Rheumatology

## 2019-01-23 DIAGNOSIS — M19072 Primary osteoarthritis, left ankle and foot: Secondary | ICD-10-CM

## 2019-01-23 DIAGNOSIS — M19071 Primary osteoarthritis, right ankle and foot: Secondary | ICD-10-CM

## 2019-01-23 DIAGNOSIS — R7989 Other specified abnormal findings of blood chemistry: Secondary | ICD-10-CM

## 2019-01-23 DIAGNOSIS — M0609 Rheumatoid arthritis without rheumatoid factor, multiple sites: Secondary | ICD-10-CM

## 2019-01-23 DIAGNOSIS — Z8669 Personal history of other diseases of the nervous system and sense organs: Secondary | ICD-10-CM

## 2019-01-23 DIAGNOSIS — Z8719 Personal history of other diseases of the digestive system: Secondary | ICD-10-CM

## 2019-01-23 DIAGNOSIS — R945 Abnormal results of liver function studies: Secondary | ICD-10-CM

## 2019-01-23 DIAGNOSIS — Z8709 Personal history of other diseases of the respiratory system: Secondary | ICD-10-CM

## 2019-01-23 DIAGNOSIS — Z8639 Personal history of other endocrine, nutritional and metabolic disease: Secondary | ICD-10-CM

## 2019-01-23 DIAGNOSIS — Z79899 Other long term (current) drug therapy: Secondary | ICD-10-CM

## 2019-01-23 NOTE — Telephone Encounter (Signed)
-----   Message from Carole Binning, LPN sent at 12/21/3644  3:01 PM EDT ----- Please schedule patient for a follow up appointment in 2 weeks. Patient had a telemedicine visit. Thanks!

## 2019-02-04 ENCOUNTER — Telehealth: Payer: Self-pay | Admitting: *Deleted

## 2019-02-04 NOTE — Telephone Encounter (Signed)
Received a Prior Authorization request from [facility] for ENBREL. Authorization has been submitted to patient's insurance via Cover My Meds. Will update once we receive a response.

## 2019-02-04 NOTE — Progress Notes (Signed)
Office Visit Note  Patient: [name]             Date of Birth: Sep 20, 1965           MRN: [id number]             PCP: Orma Flaming, MD Referring: Orma Flaming, MD Visit Date: 02/06/2019 Occupation: @GUAROCC @  Subjective:  Pain in multiple joints    History of Present Illness: [name] is a 53 y.o. female with history of seronegative rheumatoid arthritis and osteoarthritis. She is taking PLQ 200 mg BID.  She discontinued MTX due to elevated LFTs.  She is having left hip pain, right elbow, bilateral elbow joints, and both ankle joints.  She has occasional pain in both hands.  She reports intermittent swelling in both wrist joints.  She is ready to proceed with Enbrel.    Activities of Daily Living:  Patient reports morning stiffness for 1-1.5  hours.   Patient Reports nocturnal pain.  Difficulty dressing/grooming: Denies Difficulty climbing stairs: Reports Difficulty getting out of chair: Reports Difficulty using hands for taps, buttons, cutlery, and/or writing: Denies  Review of Systems  Constitutional: Negative for fatigue.  HENT: Negative for mouth sores, mouth dryness and nose dryness.   Eyes: Positive for dryness. Negative for pain and visual disturbance.  Respiratory: Negative for cough, hemoptysis, shortness of breath and difficulty breathing.   Cardiovascular: Negative for chest pain, palpitations, hypertension and swelling in legs/feet.  Gastrointestinal: Negative for blood in stool, constipation and diarrhea.  Endocrine: Negative for increased urination.  Genitourinary: Negative for painful urination.  Musculoskeletal: Positive for arthralgias, joint pain, joint swelling and morning stiffness. Negative for myalgias, muscle weakness, muscle tenderness and myalgias.  Skin: Negative for color change, pallor, rash, hair loss, nodules/bumps, skin tightness, ulcers and sensitivity to sunlight.  Allergic/Immunologic: Negative for susceptible to infections.   Neurological: Negative for dizziness, numbness, headaches and weakness.  Hematological: Negative for swollen glands.  Psychiatric/Behavioral: Positive for sleep disturbance. Negative for depressed mood. The patient is not nervous/anxious.     PMFS History:  Patient Active Problem List   Diagnosis Date Noted  . Primary osteoarthritis of both feet 10/09/2018  . Hx of migraines 09/27/2018  . History of gastroesophageal reflux (GERD) 09/27/2018  . 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 Moderate persistent asthma 07/05/2018  . Hypothyroid 06/28/2018    Past Medical History:  Diagnosis Date  . Arthritis   . Asthma   . Frequent headaches   . GERD (gastroesophageal reflux disease)   . H/O seasonal allergies   . History of chicken pox   . Migraines   . Thyroid disease     Family History  Problem Relation Age of Onset  . Arthritis Mother   . Cancer Mother        breast ca  . Miscarriages / [place] Mother   . Diabetes Father   . Heart attack Father   . Heart disease Father   . Hyperlipidemia Father   . Hypertension Father   . Stroke Father        x3  . COPD Sister   . Peripheral Artery Disease Sister   . Asthma Brother   . Birth defects Brother   . Cancer Brother        skin  . Hyperlipidemia Brother   . Heart attack Maternal Grandmother   . Heart disease Maternal Grandmother   . Hyperlipidemia Maternal Grandmother   . Stroke Maternal Grandmother   . Alcohol abuse Paternal  Grandmother   . Cancer Paternal Grandmother   . Cancer Paternal Grandfather        breast  . Heart attack Paternal Grandfather   . Alcohol abuse Brother   . Early death Brother        suicide  . Mental illness Brother   . Asthma Daughter   . Asthma Son   . Asthma Son    Past Surgical History:  Procedure Laterality Date  . CARDIAC CATHETERIZATION    . CHOLECYSTECTOMY  1995  . [place]  2009  . HERNIA REPAIR  2017  . LAPAROSCOPIC HYSTERECTOMY  2006  . THYROID  SURGERY     Social History   Social History Narrative  . Not on file   Immunization History  Administered Date(s) Administered  . Influenza,inj,Quad PF,6+ Mos 06/28/2018  . Influenza-Unspecified 01/25/2018  . Pneumococcal Polysaccharide-23 10/22/2018  . Tdap 06/28/2018     Objective: Vital Signs: BP 111/65   Pulse 74   Resp 12   Ht 5' 7.5" (1.715 m)   Wt 171 lb (77.6 kg)   LMP  (LMP Unknown)   BMI 26.39 kg/m    Physical Exam Vitals signs and nursing note reviewed.  Constitutional:      Appearance: She is well-developed.  HENT:     Head: Normocephalic and atraumatic.  Eyes:     Conjunctiva/sclera: Conjunctivae normal.  Neck:     Musculoskeletal: Normal range of motion.  Cardiovascular:     Rate and Rhythm: Normal rate and regular rhythm.     Heart sounds: Normal heart sounds.  Pulmonary:     Effort: Pulmonary effort is normal.     Breath sounds: Normal breath sounds.  Abdominal:     General: Bowel sounds are normal.     Palpations: Abdomen is soft.  Lymphadenopathy:     Cervical: No cervical adenopathy.  Skin:    General: Skin is warm and dry.     Capillary Refill: Capillary refill takes less than 2 seconds.  Neurological:     Mental Status: She is alert and oriented to person, place, and time.  Psychiatric:        Behavior: Behavior normal.      Musculoskeletal Exam: C-spine, thoracic spine, and lumbar spine good ROM.  Shoulder joints good ROM with no discomfort.  Elbow joints good ROM.  Right elbow joint tenderness.  Limited ROM with discomfort of both wrist joints.  Tenderness of both wrist joints.  MCPs, PIPs, and DIPs good ROM with no synovitis.  Right 1st MCP and right wrist synovitis.  Hip joints, knee joints, ankle joints, MTPs, PIPs, and DIPs good ROM with no synovitis.  No warmth or effusion of knee joints.  No tenderness or swelling of ankle joints.   CDAI Exam: CDAI Score: 7.4  Patient Global Assessment: 7 (mm); Provider Global Assessment: 7 (mm)  Swollen: 2 ; Tender: 4  Joint Exam      Right  Left  Elbow   Tender     Wrist  Swollen Tender   Tender  MCP 1  Swollen Tender        Investigation: No additional findings.  Imaging: No results found.  Recent Labs: Lab Results  Component Value Date   WBC 8.2 11/15/2018   HGB 12.8 11/15/2018   PLT 277 11/15/2018   NA 138 11/15/2018   K 4.3 11/15/2018   CL 103 11/15/2018   CO2 26 11/15/2018   GLUCOSE 92 11/15/2018   BUN 16 11/15/2018  CREATININE 0.76 11/15/2018   BILITOT 0.5 11/15/2018   ALKPHOS 63 08/28/2018   AST 34 11/15/2018   ALT 34 (H) 11/15/2018   PROT 6.5 11/15/2018   ALBUMIN 4.2 08/28/2018   CALCIUM 9.2 11/15/2018   GFRAA 106 11/15/2018   QFTBGOLDPLUS NEGATIVE 10/23/2018    Speciality Comments: Prior therapy: MTX (elevated LFT's)  Procedures:  No procedures performed Allergies: Gluten meal; Lortab [hydrocodone-acetaminophen]; Shellfish allergy; and Strawberry (diagnostic)   Assessment / Plan:     Visit Diagnoses: Rheumatoid arthritis of multiple sites with negative rheumatoid factor ([place]) - Diagnosed at [facility].  The ultrasound examination performed recently showed synovitis in her bilateral hands: She has synovitis of the right wrist joint and right first MCP joint.  She is been having increased pain in bilateral ankle joints and the right elbow joint.  She has chronic pain and intermittent joint swelling in bilateral wrist joints.  She has been taking Plaquenil 200 mg 1 tablet twice daily.  She discontinued methotrexate due to elevated LFTs.  She is ready to proceed with Enbrel subcutaneous weekly injections.  We reviewed the indications, contraindications, and potential side effects of Enbrel.  She gave herself the first injection today in the office.  She will continue taking Plaquenil as prescribed.  She does not need any refills at this time.  She is advised to notify us if she cannot tolerate Enbrel.  She will follow-up in  the office in 3 months.  High risk medication use- She has been approved for Enbrel mini. All immunosuppressant therapy labs are normal.  Plaquenil 200 mg twice daily.  No Plaquenil eye exam on file.  Most recent CBC/CMP showed elevated LFTs on 11/15/2018 and her methotrexate was decreased and then discontinued at last visit in April.  Due for CBC/CMP today and will monitor every 3 months. -- Plan: CBC with Differential/Platelet, COMPLETE METABOLIC PANEL WITH GFR  Primary osteoarthritis of both feet: She has pain in bilateral ankle joints.  She has no tenderness or synovitis noted.    Elevated LFTs: She discontinued MTX.   Other medical conditions are listed as follows:  History of gastroesophageal reflux (GERD)  Hx of migraines  History of hypothyroidism  History of asthma   Orders: Orders Placed This Encounter  Procedures  . CBC with Differential/Platelet  . COMPLETE METABOLIC PANEL WITH GFR   Meds ordered this encounter  Medications  . DISCONTD: Etanercept (ENBREL MINI) 50 MG/ML SOCT    Sig: Inject 50 mg into the skin once a week. First dose provided in office.    Dispense:  1 Cartridge    Refill:  0    Order Specific Question:   Lot Number?    Answer:   [id number]    Order Specific Question:   Expiration Date?    Answer:   05/19/2020    Order Specific Question:   Quantity    Answer:   3  . Etanercept (ENBREL MINI) 50 MG/ML SOCT    Sig: Inject 50 mg into the skin once a week.    Dispense:  12 Cartridge    Refill:  0    Face-to-face time spent with patient was 45 minutes. Greater than 50% of time was spent in counseling and coordination of care.  Follow-Up Instructions: Return in about 3 months (around 05/09/2019) for Rheumatoid arthritis.   Amy Sams PA-C  I examined and evaluated the patient with Amy Sams PA.  Patient continues to have pain and discomfort in her joints.  She has some synovitis on my examination as described above.  We had discussed Enbrel with her  in the past.  Methotrexate had to be discontinued due to  elevation of LFTs.  Detailed counseling was provided.  She was given her first Enbrel injection in the office today.  She was monitored in the office for 30 minutes.  Patient had no adverse reactions.  She will be taking Enbrel injections weekly at home now.  The plan of care was discussed as noted above.  Bo Merino, MD  Note - This record has been created using Editor, commissioning.  Chart creation errors have been sought, but may not always  have been located. Such creation errors do not reflect on  the standard of medical care.

## 2019-02-05 ENCOUNTER — Ambulatory Visit: Payer: BLUE CROSS/BLUE SHIELD | Admitting: Family Medicine

## 2019-02-05 ENCOUNTER — Ambulatory Visit: Payer: BLUE CROSS/BLUE SHIELD | Admitting: Sports Medicine

## 2019-02-05 DIAGNOSIS — D229 Melanocytic nevi, unspecified: Secondary | ICD-10-CM | POA: Diagnosis not present

## 2019-02-05 DIAGNOSIS — D485 Neoplasm of uncertain behavior of skin: Secondary | ICD-10-CM | POA: Diagnosis not present

## 2019-02-05 DIAGNOSIS — L817 Pigmented purpuric dermatosis: Secondary | ICD-10-CM | POA: Diagnosis not present

## 2019-02-05 NOTE — Telephone Encounter (Signed)
Received a fax from [facility] regarding a prior authorization for ENBREL. Authorization has been APPROVED from 02/04/2019 to 02/04/2020.   Will send document to scan center.

## 2019-02-06 ENCOUNTER — Other Ambulatory Visit: Payer: Self-pay | Admitting: Rheumatology

## 2019-02-06 ENCOUNTER — Ambulatory Visit: Payer: BLUE CROSS/BLUE SHIELD | Admitting: Rheumatology

## 2019-02-06 ENCOUNTER — Encounter: Payer: Self-pay | Admitting: Rheumatology

## 2019-02-06 ENCOUNTER — Other Ambulatory Visit: Payer: Self-pay

## 2019-02-06 VITALS — BP 111/65 | HR 74 | Resp 12 | Ht 67.5 in | Wt 171.0 lb

## 2019-02-06 DIAGNOSIS — Z8709 Personal history of other diseases of the respiratory system: Secondary | ICD-10-CM

## 2019-02-06 DIAGNOSIS — Z8669 Personal history of other diseases of the nervous system and sense organs: Secondary | ICD-10-CM

## 2019-02-06 DIAGNOSIS — M0609 Rheumatoid arthritis without rheumatoid factor, multiple sites: Secondary | ICD-10-CM

## 2019-02-06 DIAGNOSIS — R945 Abnormal results of liver function studies: Secondary | ICD-10-CM | POA: Diagnosis not present

## 2019-02-06 DIAGNOSIS — M19071 Primary osteoarthritis, right ankle and foot: Secondary | ICD-10-CM

## 2019-02-06 DIAGNOSIS — Z79899 Other long term (current) drug therapy: Secondary | ICD-10-CM | POA: Diagnosis not present

## 2019-02-06 DIAGNOSIS — Z8719 Personal history of other diseases of the digestive system: Secondary | ICD-10-CM

## 2019-02-06 DIAGNOSIS — Z8639 Personal history of other endocrine, nutritional and metabolic disease: Secondary | ICD-10-CM

## 2019-02-06 DIAGNOSIS — R7989 Other specified abnormal findings of blood chemistry: Secondary | ICD-10-CM

## 2019-02-06 DIAGNOSIS — M19072 Primary osteoarthritis, left ankle and foot: Secondary | ICD-10-CM

## 2019-02-06 MED ORDER — ETANERCEPT 50 MG/ML ~~LOC~~ SOCT: 50.0000 mg | SUBCUTANEOUS | 0 refills | Status: DC

## 2019-02-06 NOTE — Telephone Encounter (Signed)
Last Visit: 01/23/19 Next visit: 02/06/19 Labs: 11/15/18 ALT 34 methotrexate 4 tablets/week.  Okay to refill per Dr. Estanislado Pandy

## 2019-02-06 NOTE — Progress Notes (Signed)
Pharmacy Note  Subjective: Patient presents today to the [facility] to see Dr. Estanislado Pandy.   Patient seen by the pharmacist for counseling on Enbrel for rheumatoid arthritis.  Her current regimen includes methotrexate and Plaquenil with inadequate response.  Her methotrexate dose can not be increased due to elevated LFT's.  Objective:  CBC    Component Value Date/Time   WBC 8.2 11/15/2018 0925   RBC 4.44 11/15/2018 0925   HGB 12.8 11/15/2018 0925   HCT 37.5 11/15/2018 0925   PLT 277 11/15/2018 0925   MCV 84.5 11/15/2018 0925   MCH 28.8 11/15/2018 0925   MCHC 34.1 11/15/2018 0925   RDW 12.6 11/15/2018 0925   LYMPHSABS 1,714 11/15/2018 0925   MONOABS 0.5 06/28/2018 0938   EOSABS 49 11/15/2018 0925   BASOSABS 49 11/15/2018 0925     CMP     Component Value Date/Time   NA 138 11/15/2018 0925   NA 137 07/25/2016   K 4.3 11/15/2018 0925   CL 103 11/15/2018 0925   CO2 26 11/15/2018 0925   GLUCOSE 92 11/15/2018 0925   BUN 16 11/15/2018 0925   CREATININE 0.76 11/15/2018 0925   CALCIUM 9.2 11/15/2018 0925   PROT 6.5 11/15/2018 0925   ALBUMIN 4.2 08/28/2018 0922   AST 34 11/15/2018 0925   ALT 34 (H) 11/15/2018 0925   ALKPHOS 63 08/28/2018 0922   BILITOT 0.5 11/15/2018 0925   GFRNONAA 91 11/15/2018 0925   GFRAA 106 11/15/2018 0925     Baseline Immunosuppressant Therapy Labs Quantiferon TB Gold Latest Ref Rng & Units 10/23/2018  Quantiferon TB Gold Plus NEGATIVE NEGATIVE    Hepatitis Latest Ref Rng & Units 10/23/2018  Hep B Surface Ag NON-REACTI NON-REACTIVE  Hep B IgM NON-REACTI NON-REACTIVE  Hep C Ab NON-REACTI NON-REACTIVE  Hep C Ab NON-REACTI NON-REACTIVE  Hep A IgM NON-REACTI NON-REACTIVE    Lab Results  Component Value Date   HIV NON-REACTIVE 10/23/2018    Immunoglobulin Electrophoresis Latest Ref Rng & Units 10/23/2018  IgA  47 - 310 mg/dL 195  IgG 600 - 1,640 mg/dL 890  IgM 50 - 300 mg/dL 115    Serum Protein Electrophoresis Latest Ref Rng &  Units 11/15/2018  Total Protein 6.1 - 8.1 g/dL 6.5  Albumin 3.8 - 4.8 g/dL -  Alpha-1 0.2 - 0.3 g/dL -  Alpha-2 0.5 - 0.9 g/dL -  Beta Globulin 0.4 - 0.6 g/dL -  Beta 2 0.2 - 0.5 g/dL -  Gamma Globulin 0.8 - 1.7 g/dL -    Lab Results  Component Value Date   G6PDH 16.2 09/27/2018    No results found for: TPMT   Chest x-ray: no active cardiopulmonary disease  Does patient have diagnosis of heart failure?  No  Assessment/Plan:  Counseled patient that Enbrel is a TNF blocking agent. Counseled patient on purpose, proper use, and adverse effects of Enbrel.  Reviewed the most common adverse effects including infections, headache, and injection site reactions. Discussed that there is the possibility of an increased risk of malignancy but it is not well understood if this increased risk is due to the medication or the disease state.  Advised patient to get yearly dermatology exams due to risk of skin cancer.  Counseled patient that Enbrel should be held prior to scheduled surgery.  Counseled patient to avoid live vaccines while on Enbrel. Recommend annual influenza, Pneumovax 23, Prevnar 13, and Shingrix as indicated.   Reviewed the importance of regular labs while on Enbrel therapy.  Advised patient to get standing labs one month after starting Enbrel then every 2 months.  Provided patient with standing lab orders.  Provided patient with medication education material and answered all questions.  Patient voiced understanding.  Patient consented to Enbrel.  Will upload consent into the media tab.  Reviewed storage instructions for Enbrel.  Advised initial injection must be administered in office.  Patient voiced understanding.    Demonstrated proper injection technique with Enbrel Mini demo pen.  Patient able to demonstrate proper injection technique using the teach back method. Patient self injected in the left anterior thigh with:  Sample Medication: Enbrel Mini 50 mg/ml NDC: 93716-967-89 Lot:  [id number] Expiration: 09/21  Patient tolerated well.  Observed for 30 mins (injected at 10:00 AM) in office for adverse reaction and none noted. Instructed patient to call with any questions/issues.    Patient dose will be Enbrel 50 mg every 7 days. She is to continue Plaquenil. She has been approved for Enbrel.  Per insurance she must fill at [facility]. She has Pharmacist, community so co-pay card eligible.  Script sent to [facility] and patient given 2 extra samples with same LOT/Expiration for any shipping delays.  Instructed patient to call [facility] if she has not heard from them by Monday.  Patient given information for Enbrel Support program for enrollment in co-pay card.  All questions encouraged and answered.  Instructed patient to call with any further questions or concerns.  Mariella Saa, PharmD, [facility] Rheumatology Clinical Pharmacist  02/06/2019 10:08 AM

## 2019-02-06 NOTE — Patient Instructions (Addendum)
Standing Labs We placed an order today for your standing lab work.    Please come back and get your standing labs in 1 month and then every 3 months.  We have open lab Monday through Friday from 8:30-11:30 AM and 1:30-4:00 PM  at the office of Dr. Bo Merino.   You may experience shorter wait times on Monday and Friday afternoons. The office is located at [address] [place], [place], [place] [address] No appointment is necessary.   Labs are drawn by [facility].  You may receive a bill from [facility] for your lab work.  If you wish to have your labs drawn at another location, please call the office 24 hours in advance to send orders.  If you have any questions regarding directions or hours of operation,  please call [phone].   Just as a reminder please drink plenty of water prior to coming for your lab work. Thanks!  Helpful Tips for Injecting To help alleviate pain and injection site reactions consider the following tips:  . Placing something cold (like and ice gel pack or cold water bottle) on the injection site just before cleansing with alcohol may help reduce pain . If you have a localized reaction (redness, mild swelling, warmth, and itching) you can use topical corticosteroids (hydrocortisone cream) or antihistamine (Claritin) to help minimize reaction the day of, the day before, and the day after injecting . Always inject this medication at room temperature (remove from refrigerator 15-20 minutes before injecting; this may help eliminate stinging). . Always rotate your injection sites using inside/outside thigh of both legs, abdomen divided into 4 quadrants (stay away from waist line, and 2" away from navel). . Do not inject into areas where skin is tender, bruised, red, or hard or where there are scars or stretch marks. . Always let alcohol dry on the skin before injecting. . Place a cold, damp towel or small ice pack on the injection site for 10 or 15 minutes every 1  to 2 hours if it hurts or is swollen.  Etanercept injection What is this medicine? ETANERCEPT (et a [employer]) is used for the treatment of rheumatoid arthritis in adults and children. The medicine is also used to treat psoriatic arthritis, ankylosing spondylitis, and psoriasis. This medicine may be used for other purposes; ask your health care provider or pharmacist if you have questions. COMMON BRAND NAME(S): Enbrel What should I tell my health care provider before I take this medicine? They need to know if you have any of these conditions: -blood disorders -cancer -congestive heart failure -diabetes -exposure to chickenpox -immune system problems -infection -multiple sclerosis -seizure disorder -tuberculosis, a positive skin test for tuberculosis or have recently been in close contact with someone who has tuberculosis -Wegener's granulomatosis -an unusual or allergic reaction to etanercept, latex, other medicines, foods, dyes, or preservatives -pregnant or trying to get pregnant -breast-feeding How should I use this medicine? The medicine is given by injection under the skin. You will be taught how to prepare and give this medicine. Use exactly as directed. Take your medicine at regular intervals. Do not take your medicine more often than directed. It is important that you put your used needles and syringes in a special sharps container. Do not put them in a trash can. If you do not have a sharps container, call your pharmacist or healthcare provider to get one. A special MedGuide will be given to you by the pharmacist with each prescription and refill. Be sure  to read this information carefully each time. Talk to your pediatrician regarding the use of this medicine in children. While this drug may be prescribed for children as young as 44 years of age for selected conditions, precautions do apply. Overdosage: If you think you have taken too much of this medicine contact a poison control  center or emergency room at once. NOTE: This medicine is only for you. Do not share this medicine with others. What if I miss a dose? If you miss a dose, contact your health care professional to find out when you should take your next dose. Do not take double or extra doses without advice. What may interact with this medicine? Do not take this medicine with any of the following medications: -anakinra This medicine may also interact with the following medications: -cyclophosphamide -sulfasalazine -vaccines This list may not describe all possible interactions. Give your health care provider a list of all the medicines, herbs, non-prescription drugs, or dietary supplements you use. Also tell them if you smoke, drink alcohol, or use illegal drugs. Some items may interact with your medicine. What should I watch for while using this medicine? Tell your doctor or healthcare professional if your symptoms do not start to get better or if they get worse. You will be tested for tuberculosis (TB) before you start this medicine. If your doctor prescribes any medicine for TB, you should start taking the TB medicine before starting this medicine. Make sure to finish the full course of TB medicine. Call your doctor or health care professional for advice if you get a fever, chills or sore throat, or other symptoms of a cold or flu. Do not treat yourself. This drug decreases your body's ability to fight infections. Try to avoid being around people who are sick. What side effects may I notice from receiving this medicine? Side effects that you should report to your doctor or health care professional as soon as possible: -allergic reactions like skin rash, itching or hives, swelling of the face, lips, or tongue -changes in vision -fever, chills or any other sign of infection -numbness or tingling in legs or other parts of the body -red, scaly patches or raised bumps on the skin -shortness of breath or difficulty  breathing -swollen lymph nodes in the neck, underarm, or groin areas -unexplained weight loss -unusual bleeding or bruising -unusual swelling or fluid retention in the legs -unusually weak or tired Side effects that usually do not require medical attention (report to your doctor or health care professional if they continue or are bothersome): -dizziness -headache -nausea -redness, itching, or swelling at the injection site -vomiting This list may not describe all possible side effects. Call your doctor for medical advice about side effects. You may report side effects to FDA at 1-800-FDA-1088. Where should I keep my medicine? Keep out of the reach of children. Store between 2 and 8 degrees C (36 and 46 degrees F). Do not freeze or shake. Protect from light. Throw away any unused medicine after the expiration date. You will be instructed on how to store this medicine. NOTE: This sheet is a summary. It may not cover all possible information. If you have questions about this medicine, talk to your doctor, pharmacist, or health care provider.  2019 Elsevier/Gold Standard (2012-03-11 15:33:36)

## 2019-02-07 LAB — COMPLETE METABOLIC PANEL WITH GFR
AG Ratio: 1.8 (calc) (ref 1.0–2.5)
ALT: 74 U/L — ABNORMAL HIGH (ref 6–29)
AST: 61 U/L — ABNORMAL HIGH (ref 10–35)
Albumin: 4.4 g/dL (ref 3.6–5.1)
Alkaline phosphatase (APISO): 63 U/L (ref 37–153)
BUN: 15 mg/dL (ref 7–25)
CO2: 27 mmol/L (ref 20–32)
Calcium: 9.7 mg/dL (ref 8.6–10.4)
Chloride: 104 mmol/L (ref 98–110)
Creat: 0.9 mg/dL (ref 0.50–1.05)
GFR, Est African American: 86 mL/min/{1.73_m2} (ref >=60)
GFR, Est Non African American: 75 mL/min/{1.73_m2} (ref >=60)
Globulin: 2.5 g/dL (calc) (ref 1.9–3.7)
Glucose, Bld: 87 mg/dL (ref 65–99)
Potassium: 4.4 mmol/L (ref 3.5–5.3)
Sodium: 140 mmol/L (ref 135–146)
Total Bilirubin: 0.7 mg/dL (ref 0.2–1.2)
Total Protein: 6.9 g/dL (ref 6.1–8.1)

## 2019-02-07 LAB — CBC WITH DIFFERENTIAL/PLATELET
Absolute Monocytes: 454 cells/uL (ref 200–950)
Basophils Absolute: 41 cells/uL (ref 0–200)
Basophils Relative: 0.7 %
Eosinophils Absolute: 83 cells/uL (ref 15–500)
Eosinophils Relative: 1.4 %
HCT: 39.2 % (ref 35.0–45.0)
Hemoglobin: 13.4 g/dL (ref 11.7–15.5)
Lymphs Abs: 1847 cells/uL (ref 850–3900)
MCH: 30 pg (ref 27.0–33.0)
MCHC: 34.2 g/dL (ref 32.0–36.0)
MCV: 87.7 fL (ref 80.0–100.0)
MPV: 11.3 fL (ref 7.5–12.5)
Monocytes Relative: 7.7 %
Neutro Abs: 3475 cells/uL (ref 1500–7800)
Neutrophils Relative %: 58.9 %
Platelets: 269 10*3/uL (ref 140–400)
RBC: 4.47 10*6/uL (ref 3.80–5.10)
RDW: 13.1 % (ref 11.0–15.0)
Total Lymphocyte: 31.3 %
WBC: 5.9 10*3/uL (ref 3.8–10.8)

## 2019-02-17 DIAGNOSIS — I447 Left bundle-branch block, unspecified: Secondary | ICD-10-CM

## 2019-02-17 HISTORY — DX: Left bundle-branch block, unspecified: I44.7

## 2019-02-18 ENCOUNTER — Encounter: Payer: Self-pay | Admitting: Rheumatology

## 2019-02-19 NOTE — Telephone Encounter (Signed)
The reaction is very mild.  She can take Zyrtec couple of days prior to injection in the day of injection.  She can also use topical hydrocortisone cream.  If it is still bothers her we can try switching her medication.

## 2019-02-19 NOTE — Telephone Encounter (Signed)
Called patient to notify her of Dr. Arlean Hopping response.  Dr. Estanislado Pandy agrees that it is a mild reaction and to continue her Enbrel at this time.  She is also to continue taking Claritin daily and to try hydrocortisone cream topically instead of Benadryl.  Patient verbalized understanding.  She has injected in each thigh.  Patient plans to inject in stomach to see if that will help.  Instructed patient to call us if there are reactions get larger or become unbearable.  Patient verbalized understanding.  All questions encouraged and answered.  Instructed patient to call with any further questions or concerns.  Mariella Saa, PharmD, BCACP Rheumatology Clinical Pharmacist  02/19/2019 1:46 PM

## 2019-02-19 NOTE — Telephone Encounter (Signed)
Returned patient's call to discuss injection site reaction.  The first picture is after her first injection.  The rash developed 3 days after injection and is itchy.  The second picture is after her second injection and also developed 3 days after injection and was more red swollen and itchy.  Patient states that she takes a daily Claritin for allergies.  She also took Benadryl the day before, the day of, and the day after injection.  She is also using topical Benadryl for itching.  Informed patient that injection site reactions are not uncommon but due to her already taking a daily antihistamine will discuss with Dr. Estanislado Pandy.  We will follow-up with patient pending Dr. Arlean Hopping response.  All questions encouraged and answered.  Instructed patient to call with any further questions or concerns.  Mariella Saa, PharmD, [facility] Clinical Pharmacist  02/19/2019 8:30 AM

## 2019-02-19 NOTE — Telephone Encounter (Signed)
Returned patient's call to discuss her symptoms.  Informed that Enbrel does not have a side effect of sweating.  Also her liver enzymes are mildly elevated and methotrexate was stopped due to increasing trend in order to prevent liver injury which is a side effect of methotrexate.  Sweating would not be associated with her elevated liver enzymes.  Her symptoms are most likely due to to change in weather or menopause.  Patient verbalized understanding.  Instructed patient to follow-up with PCP if her symptoms are intolerable.  Patient verbalized understanding.  All questions encouraged and answered.  Instructed patient to call with any further questions or concerns.  Mariella Saa, PharmD, [facility] Clinical Pharmacist  02/19/2019 8:24 AM

## 2019-02-19 NOTE — Progress Notes (Signed)
[name] [address] [place], [place] [address] Phone: [phone] Subjective:   I [name] am serving as a Education administrator for Dr. Hulan Saas.  CC: Hamstring injury  [id number]  [name] is a 53 y.o. female coming in with complaint of left hamstring pain right wrist pain. Wrist is stiff. Injured Feb. 24th while boxing. Pressure with sitting. Felt her hamstring pop.   Onset- Pulled the hamstring yesterday jogging Location - proximal  Duration-  Character- achy, sharp pull Aggravating factors- sitting, joging Reliving factors-  Therapies tried- Ice, heat, pennsaid, wrapped it  Severity-7 out of 10.   Hx of RA   Past Medical History:  Diagnosis Date  . Arthritis   . Asthma   . Frequent headaches   . GERD (gastroesophageal reflux disease)   . H/O seasonal allergies   . History of chicken pox   . Migraines   . Thyroid disease    Past Surgical History:  Procedure Laterality Date  . CARDIAC CATHETERIZATION    . CHOLECYSTECTOMY  1995  . [place]  2009  . HERNIA REPAIR  2017  . LAPAROSCOPIC HYSTERECTOMY  2006  . THYROID SURGERY     Social History   Socioeconomic History  . Marital status: Married    Spouse name: Not on file  . Number of children: Not on file  . Years of education: Not on file  . Highest education level: Not on file  Occupational History  . Not on file  Social Needs  . Financial resource strain: Not on file  . Food insecurity:    Worry: Not on file    Inability: Not on file  . Transportation needs:    Medical: Not on file    Non-medical: Not on file  Tobacco Use  . Smoking status: Never Smoker  . Smokeless tobacco: Never Used  Substance and Sexual Activity  . Alcohol use: Yes    Comment: occ  . Drug use: Never  . Sexual activity: Yes    Partners: Male  Lifestyle  . Physical activity:    Days per week: Not on file    Minutes per session: Not on file  . Stress: Not on file   Relationships  . Social connections:    Talks on phone: Not on file    Gets together: Not on file    Attends religious service: Not on file    Active member of club or organization: Not on file    Attends meetings of clubs or organizations: Not on file    Relationship status: Not on file  Other Topics Concern  . Not on file  Social History Narrative  . Not on file   Allergies  Allergen Reactions  . Gluten Meal   . Lortab [Hydrocodone-Acetaminophen]   . Shellfish Allergy   . Strawberry (Diagnostic)     Hazelnuts, okra   Family History  Problem Relation Age of Onset  . Arthritis Mother   . Cancer Mother        breast ca  . Miscarriages / [place] Mother   . Diabetes Father   . Heart attack Father   . Heart disease Father   . Hyperlipidemia Father   . Hypertension Father   . Stroke Father        x3  . COPD Sister   . Peripheral Artery Disease Sister   . Asthma Brother   . Birth defects Brother   . Cancer Brother  skin  . Hyperlipidemia Brother   . Heart attack Maternal Grandmother   . Heart disease Maternal Grandmother   . Hyperlipidemia Maternal Grandmother   . Stroke Maternal Grandmother   . Alcohol abuse Paternal Grandmother   . Cancer Paternal Grandmother   . Cancer Paternal Grandfather        breast  . Heart attack Paternal Grandfather   . Alcohol abuse Brother   . Early death Brother        suicide  . Mental illness Brother   . Asthma Daughter   . Asthma Son   . Asthma Son     Current Outpatient Medications (Endocrine & Metabolic):  .  levothyroxine (SYNTHROID) 175 MCG tablet, TAKE 1 TABLET (175 MCG TOTAL) BY MOUTH DAILY BEFORE BREAKFAST. LABS IN 8 WEEKS .  levothyroxine (SYNTHROID, LEVOTHROID) 150 MCG tablet, One tablet by mouth every other day   Current Outpatient Medications (Respiratory):  Marland Kitchen  ADVAIR DISKUS 250-50 MCG/DOSE AEPB, Inhale 1 puff into the lungs 2 (two) times daily. Marland Kitchen  albuterol (PROVENTIL HFA;VENTOLIN HFA) 108 (90 Base)  MCG/ACT inhaler, Inhale 2 puffs into the lungs every 6 (six) hours as needed for wheezing or shortness of breath. Lianne Moris ALLERGY & CONGESTION 180-240 MG 24 hr tablet, Take 1 tablet by mouth daily. .  montelukast (SINGULAIR) 10 MG tablet, Take 1 tablet (10 mg total) by mouth at bedtime.  Current Outpatient Medications (Analgesics):  Marland Kitchen  Etanercept (ENBREL MINI) 50 MG/ML SOCT, Inject 50 mg into the skin once a week.  Current Outpatient Medications (Hematological):  .  folic acid (FOLVITE) 1 MG tablet, Take 2 tablets (2 mg total) by mouth daily.  Current Outpatient Medications (Other):  .  cholecalciferol (VITAMIN D) 400 units TABS tablet, Take 400 Units by mouth. .  Diclofenac Sodium ([name]) 2 % SOLN, Place 1 application onto the skin 2 (two) times daily. .  diclofenac sodium (VOLTAREN) 1 % GEL, 3 grams to 3 large joints up to 3 times daily .  hydroxychloroquine (PLAQUENIL) 200 MG tablet, Take 1 tablet (200 mg total) by mouth 2 (two) times daily.    Past medical history, social, surgical and family history all reviewed in electronic medical record.  No pertanent information unless stated regarding to the chief complaint.   Review of Systems:  No headache, visual changes, nausea, vomiting, diarrhea, constipation, dizziness, abdominal pain, skin rash, fevers, chills, night sweats, weight loss, swollen lymph nodes, body aches, joint swelling, , chest pain, shortness of breath, mood changes.  Positive muscle aches  Objective  Blood pressure 110/70, pulse 79, height 5' 7.5" (1.715 m), SpO2 99 %.    General: No apparent distress alert and oriented x3 mood and affect normal, dressed appropriately.  HEENT: Pupils equal, extraocular movements intact  Respiratory: Patient's speak in full sentences and does not appear short of breath  Cardiovascular: No lower extremity edema, non tender, no erythema  Skin: Patient's thigh has 2 areas that appear to be more of an erythema in the dose of  tender to palpation..  Abdomen: Soft nontender  Neuro: Cranial nerves II through XII are intact, neurovascularly intact in all extremities with 2+ DTRs and 2+ pulses.  Lymph: No lymphadenopathy of posterior or anterior cervical chain or axillae bilaterally.  Gait antalgic favoring left hip MSK:  tender with full range of motion and good stability and symmetric strength and tone of shoulders, elbows, wrist, hip, and ankles bilaterally.  Mild synovitis of multiple joints Left knee and hip exam shows  the patient does have some tenderness to palpation in the midportion of the hamstring.  Does feel have significant tightness on the side compared to the contralateral side.  Pain with resisted flexion but no gapping noted.  No defect in the muscles noted.  Limited musculoskeletal ultrasound was performed and interpreted by Lyndal Pulley  Limited ultrasound of patient's hamstring does not show any true defect.  Patient does have some hyperechoic changes within the muscle itself and is consistent with a strain.  No signs of any avulsion. Impression: Hamstring strain   Impression and Recommendations:     This case required medical decision making of moderate complexity. The above documentation has been reviewed and is accurate and complete Lyndal Pulley, DO       Note: This dictation was prepared with Dragon dictation along with smaller phrase technology. Any transcriptional errors that result from this process are unintentional.

## 2019-02-20 ENCOUNTER — Other Ambulatory Visit: Payer: Self-pay

## 2019-02-20 ENCOUNTER — Ambulatory Visit (INDEPENDENT_AMBULATORY_CARE_PROVIDER_SITE_OTHER): Payer: BLUE CROSS/BLUE SHIELD | Admitting: Family Medicine

## 2019-02-20 ENCOUNTER — Encounter: Payer: Self-pay | Admitting: Family Medicine

## 2019-02-20 ENCOUNTER — Ambulatory Visit: Payer: Self-pay

## 2019-02-20 VITALS — BP 110/70 | HR 79 | Ht 67.5 in

## 2019-02-20 DIAGNOSIS — M79604 Pain in right leg: Secondary | ICD-10-CM | POA: Diagnosis not present

## 2019-02-20 DIAGNOSIS — S76312A Strain of muscle, fascia and tendon of the posterior muscle group at thigh level, left thigh, initial encounter: Secondary | ICD-10-CM

## 2019-02-20 DIAGNOSIS — L52 Erythema nodosum: Secondary | ICD-10-CM

## 2019-02-20 NOTE — Assessment & Plan Note (Signed)
Patient has some spots on her skin that is concerning for a drug reaction.  Patient is being followed by rheumatologist.  Third injection of Enbrel to see how patient reacts

## 2019-02-20 NOTE — Assessment & Plan Note (Signed)
Patient does have a hamstring strain.  I do not see any true defect in the muscle.  Discussed with patient in great length.  We will do compression, icing regimen, home exercises.  We discussed different medications at this time we will avoid any oral anti-inflammatories but topical may be more beneficial.  Patient will start with a home exercises and work with Product/process development scientist today.  Follow-up again in 3 to 4 weeks

## 2019-02-20 NOTE — Patient Instructions (Signed)
Good to see you.  Ice 20 minutes 2 times daily. Usually after activity and before bed. Exercises 3 times a week.  Body helix Amy Dillingham [phone] CoQ 10 200 mg daily  Increase vitamin D to 5,000 Wrap wrist when boxing See me again in 3 weeks

## 2019-03-10 ENCOUNTER — Other Ambulatory Visit: Payer: Self-pay | Admitting: Family Medicine

## 2019-03-13 ENCOUNTER — Ambulatory Visit: Payer: BC Managed Care – PPO | Admitting: Family Medicine

## 2019-03-13 ENCOUNTER — Other Ambulatory Visit: Payer: Self-pay

## 2019-03-13 ENCOUNTER — Encounter: Payer: Self-pay | Admitting: Family Medicine

## 2019-03-13 DIAGNOSIS — M25531 Pain in right wrist: Secondary | ICD-10-CM | POA: Diagnosis not present

## 2019-03-13 DIAGNOSIS — Z79899 Other long term (current) drug therapy: Secondary | ICD-10-CM | POA: Diagnosis not present

## 2019-03-13 MED ORDER — DOXYCYCLINE HYCLATE 100 MG PO TABS: 100.0000 mg | ORAL_TABLET | Freq: Two times a day (BID) | ORAL | 0 refills | Status: DC

## 2019-03-13 NOTE — Assessment & Plan Note (Signed)
Patient does have right wrist pain.  Has been going on for greater than 3 months.  Did have an injection in the unfortunately caused worsening pain and since then has had more difficulty.  Patient describes the pain is severe.  Patient states that the erythema has been there since the injection and at this point we will treat her with the possibility of medications for antibiotics.  Low likelihood.  Patient does have an autoimmune disease and could be contributing to some of the discomfort and pain.  Discussed with patient again at great length.  Discussed home exercises and icing regimen.  Patient will try the conservative therapy and follow-up with me again in 4 to 8 weeks.  Worsening pain consider advanced imaging

## 2019-03-13 NOTE — Progress Notes (Signed)
[facility] [address] [place], [place] [address] Phone: [phone] Subjective:   I [name] am serving as a Education administrator for Dr. Hulan Saas.  I'm seeing this patient by the request  of:    CC: Leg pain and wrist pain follow-up  [id number]   02/20/2019 Patient does have a hamstring strain.  I do not see any true defect in the muscle.  Discussed with patient in great length.  We will do compression, icing regimen, home exercises.  We discussed different medications at this time we will avoid any oral anti-inflammatories but topical may be more beneficial.  Patient will start with a home exercises and work with Product/process development scientist today.  Follow-up again in 3 to 4 weeks  Patient has some spots on her skin that is concerning for a drug reaction.  Patient is being followed by rheumatologist.  Third injection of Enbrel to see how patient reacts  03/13/2019 [name] is a 53 y.o. female coming in with complaint of left leg and right wrist pain. States the leg is doing a lot better. Still having issues but has made some improvements. Wrist is very stiff and something is popping.   Patient considers in her hamstring seems to be doing very well.  Denies any significant pain at this time.  Patient's right wrist on continues to give some discomfort.  Patient describes the pain as an aching sensation.  Patient states with certain movements a sharp pain does occur.     Past Medical History:  Diagnosis Date  . Arthritis   . Asthma   . Frequent headaches   . GERD (gastroesophageal reflux disease)   . H/O seasonal allergies   . History of chicken pox   . Migraines   . Thyroid disease    Past Surgical History:  Procedure Laterality Date  . CARDIAC CATHETERIZATION    . CHOLECYSTECTOMY  1995  . [place]  2009  . HERNIA REPAIR  2017  . LAPAROSCOPIC HYSTERECTOMY  2006  . THYROID SURGERY     Social History   Socioeconomic History  . Marital  status: Married    Spouse name: Not on file  . Number of children: Not on file  . Years of education: Not on file  . Highest education level: Not on file  Occupational History  . Not on file  Social Needs  . Financial resource strain: Not on file  . Food insecurity    Worry: Not on file    Inability: Not on file  . Transportation needs    Medical: Not on file    Non-medical: Not on file  Tobacco Use  . Smoking status: Never Smoker  . Smokeless tobacco: Never Used  Substance and Sexual Activity  . Alcohol use: Yes    Comment: occ  . Drug use: Never  . Sexual activity: Yes    Partners: Male  Lifestyle  . Physical activity    Days per week: Not on file    Minutes per session: Not on file  . Stress: Not on file  Relationships  . Social Herbalist on phone: Not on file    Gets together: Not on file    Attends religious service: Not on file    Active member of club or organization: Not on file    Attends meetings of clubs or organizations: Not on file    Relationship status: Not on file  Other Topics Concern  .  Not on file  Social History Narrative  . Not on file   Allergies  Allergen Reactions  . Gluten Meal   . Lortab [Hydrocodone-Acetaminophen]   . Shellfish Allergy   . Strawberry (Diagnostic)     Hazelnuts, okra   Family History  Problem Relation Age of Onset  . Arthritis Mother   . Cancer Mother        breast ca  . Miscarriages / [place] Mother   . Diabetes Father   . Heart attack Father   . Heart disease Father   . Hyperlipidemia Father   . Hypertension Father   . Stroke Father        x3  . COPD Sister   . Peripheral Artery Disease Sister   . Asthma Brother   . Birth defects Brother   . Cancer Brother        skin  . Hyperlipidemia Brother   . Heart attack Maternal Grandmother   . Heart disease Maternal Grandmother   . Hyperlipidemia Maternal Grandmother   . Stroke Maternal Grandmother   . Alcohol abuse Paternal Grandmother   .  Cancer Paternal Grandmother   . Cancer Paternal Grandfather        breast  . Heart attack Paternal Grandfather   . Alcohol abuse Brother   . Early death Brother        suicide  . Mental illness Brother   . Asthma Daughter   . Asthma Son   . Asthma Son     Current Outpatient Medications (Endocrine & Metabolic):  .  levothyroxine (SYNTHROID) 175 MCG tablet, TAKE 1 TABLET BY MOUTH DAILY BEFORE BREAKFASt .  levothyroxine (SYNTHROID, LEVOTHROID) 150 MCG tablet, One tablet by mouth every other day   Current Outpatient Medications (Respiratory):  Marland Kitchen  ADVAIR DISKUS 250-50 MCG/DOSE AEPB, Inhale 1 puff into the lungs 2 (two) times daily. Marland Kitchen  albuterol (PROVENTIL HFA;VENTOLIN HFA) 108 (90 Base) MCG/ACT inhaler, Inhale 2 puffs into the lungs every 6 (six) hours as needed for wheezing or shortness of breath. Lianne Moris ALLERGY & CONGESTION 180-240 MG 24 hr tablet, Take 1 tablet by mouth daily. .  montelukast (SINGULAIR) 10 MG tablet, Take 1 tablet (10 mg total) by mouth at bedtime.  Current Outpatient Medications (Analgesics):  Marland Kitchen  Etanercept (ENBREL MINI) 50 MG/ML SOCT, Inject 50 mg into the skin once a week.  Current Outpatient Medications (Hematological):  .  folic acid (FOLVITE) 1 MG tablet, Take 2 tablets (2 mg total) by mouth daily.  Current Outpatient Medications (Other):  .  cholecalciferol (VITAMIN D) 400 units TABS tablet, Take 400 Units by mouth. .  Diclofenac Sodium ([name]) 2 % SOLN, Place 1 application onto the skin 2 (two) times daily. .  diclofenac sodium (VOLTAREN) 1 % GEL, 3 grams to 3 large joints up to 3 times daily .  hydroxychloroquine (PLAQUENIL) 200 MG tablet, Take 1 tablet (200 mg total) by mouth 2 (two) times daily. Marland Kitchen  doxycycline (VIBRA-TABS) 100 MG tablet, Take 1 tablet (100 mg total) by mouth 2 (two) times daily for 10 days.    Past medical history, social, surgical and family history all reviewed in electronic medical record.  No pertanent information unless  stated regarding to the chief complaint.   Review of Systems:  No headache, visual changes, nausea, vomiting, diarrhea, constipation, dizziness, abdominal pain, skin rash, fevers, chills, night sweats, weight loss, swollen lymph nodes, body aches, joint swelling, muscle aches, chest pain, shortness of breath, mood changes.  Objective  Blood pressure 110/86, pulse 71, height 5' 7.5" (1.715 m), weight 167 lb (75.8 kg), SpO2 98 %.    General: No apparent distress alert and oriented x3 mood and affect normal, dressed appropriately.  HEENT: Pupils equal, extraocular movements intact  Respiratory: Patient's speak in full sentences and does not appear short of breath  Cardiovascular: No lower extremity edema, non tender, no erythema  Skin: Warm dry intact with no signs of infection or rash on extremities or on axial skeleton.  Abdomen: Soft nontender  Neuro: Cranial nerves II through XII are intact, neurovascularly intact in all extremities with 2+ DTRs and 2+ pulses.  Lymph: No lymphadenopathy of posterior or anterior cervical chain or axillae bilaterally.  Gait normal with good balance and coordination.  MSK:  Non tender with full range of motion and good stability and symmetric strength and tone of shoulders, elbows,  hip, knee and ankles bilaterally.  Wrist: Right Inspection n very mild trace effusion noted on the dorsal aspect of the wrist ROM smooth and normal with good flexion and extension and ulnar/radial deviation that is symmetrical with opposite wrist. Palpation mild pain over the TFCC No snuffbox tenderness. No tenderness over Canal of [place]. Strength 5/5 in all directions without pain. Negative Finkelstein, tinel's and phalens. Negative Watson's test. Contralateral wrist unremarkable Mild warmth to the wrist noted   Impression and Recommendations:     This case required medical decision making of moderate complexity. The above documentation has been reviewed and is accurate  and complete Lyndal Pulley, DO       Note: This dictation was prepared with Dragon dictation along with smaller phrase technology. Any transcriptional errors that result from this process are unintentional.

## 2019-03-13 NOTE — Patient Instructions (Signed)
Good to see you  Lets try something a little out of the box.  Doxycycline 2 times a day for 10 days- avoid direct sun if you can.  If not better in 10 days write me and we will consider pccupational therapy  Keep doing everythign else Glad the leg feels good.  See me again in 4 weeks for the wrist again

## 2019-03-14 LAB — COMPLETE METABOLIC PANEL WITH GFR
AG Ratio: 1.9 (calc) (ref 1.0–2.5)
ALT: 43 U/L — ABNORMAL HIGH (ref 6–29)
AST: 33 U/L (ref 10–35)
Albumin: 4.4 g/dL (ref 3.6–5.1)
Alkaline phosphatase (APISO): 63 U/L (ref 37–153)
BUN: 14 mg/dL (ref 7–25)
CO2: 27 mmol/L (ref 20–32)
Calcium: 9.7 mg/dL (ref 8.6–10.4)
Chloride: 103 mmol/L (ref 98–110)
Creat: 0.7 mg/dL (ref 0.50–1.05)
GFR, Est African American: 117 mL/min/{1.73_m2} (ref >=60)
GFR, Est Non African American: 101 mL/min/{1.73_m2} (ref >=60)
Globulin: 2.3 g/dL (calc) (ref 1.9–3.7)
Glucose, Bld: 53 mg/dL — ABNORMAL LOW (ref 65–99)
Potassium: 4.2 mmol/L (ref 3.5–5.3)
Sodium: 138 mmol/L (ref 135–146)
Total Bilirubin: 0.6 mg/dL (ref 0.2–1.2)
Total Protein: 6.7 g/dL (ref 6.1–8.1)

## 2019-03-14 LAB — CBC WITH DIFFERENTIAL/PLATELET
Absolute Monocytes: 480 cells/uL (ref 200–950)
Basophils Absolute: 51 cells/uL (ref 0–200)
Basophils Relative: 0.8 %
Eosinophils Absolute: 109 cells/uL (ref 15–500)
Eosinophils Relative: 1.7 %
HCT: 40.8 % (ref 35.0–45.0)
Hemoglobin: 13.6 g/dL (ref 11.7–15.5)
Lymphs Abs: 2010 cells/uL (ref 850–3900)
MCH: 29.2 pg (ref 27.0–33.0)
MCHC: 33.3 g/dL (ref 32.0–36.0)
MCV: 87.6 fL (ref 80.0–100.0)
MPV: 11.1 fL (ref 7.5–12.5)
Monocytes Relative: 7.5 %
Neutro Abs: 3750 cells/uL (ref 1500–7800)
Neutrophils Relative %: 58.6 %
Platelets: 264 10*3/uL (ref 140–400)
RBC: 4.66 10*6/uL (ref 3.80–5.10)
RDW: 12.2 % (ref 11.0–15.0)
Total Lymphocyte: 31.4 %
WBC: 6.4 10*3/uL (ref 3.8–10.8)

## 2019-03-15 ENCOUNTER — Other Ambulatory Visit: Payer: Self-pay | Admitting: Physician Assistant

## 2019-03-15 DIAGNOSIS — M1230 Palindromic rheumatism, unspecified site: Secondary | ICD-10-CM

## 2019-03-16 ENCOUNTER — Emergency Department (HOSPITAL_COMMUNITY): Payer: BC Managed Care – PPO

## 2019-03-16 ENCOUNTER — Encounter (HOSPITAL_COMMUNITY): Payer: Self-pay | Admitting: Emergency Medicine

## 2019-03-16 ENCOUNTER — Emergency Department (HOSPITAL_COMMUNITY)
Admission: EM | Admit: 2019-03-16 | Discharge: 2019-03-16 | Disposition: A | Discharge status: Home / Self Care | Payer: BC Managed Care – PPO | Attending: Emergency Medicine | Admitting: Emergency Medicine

## 2019-03-16 ENCOUNTER — Other Ambulatory Visit: Payer: Self-pay

## 2019-03-16 DIAGNOSIS — R001 Bradycardia, unspecified: Secondary | ICD-10-CM | POA: Diagnosis not present

## 2019-03-16 DIAGNOSIS — R42 Dizziness and giddiness: Secondary | ICD-10-CM | POA: Insufficient documentation

## 2019-03-16 DIAGNOSIS — I447 Left bundle-branch block, unspecified: Secondary | ICD-10-CM | POA: Diagnosis not present

## 2019-03-16 DIAGNOSIS — R300 Dysuria: Secondary | ICD-10-CM | POA: Diagnosis not present

## 2019-03-16 DIAGNOSIS — R079 Chest pain, unspecified: Secondary | ICD-10-CM | POA: Diagnosis not present

## 2019-03-16 DIAGNOSIS — Z20828 Contact with and (suspected) exposure to other viral communicable diseases: Secondary | ICD-10-CM | POA: Diagnosis not present

## 2019-03-16 DIAGNOSIS — J45909 Unspecified asthma, uncomplicated: Secondary | ICD-10-CM | POA: Diagnosis not present

## 2019-03-16 DIAGNOSIS — Z79899 Other long term (current) drug therapy: Secondary | ICD-10-CM | POA: Insufficient documentation

## 2019-03-16 DIAGNOSIS — E039 Hypothyroidism, unspecified: Secondary | ICD-10-CM | POA: Diagnosis not present

## 2019-03-16 DIAGNOSIS — R0789 Other chest pain: Secondary | ICD-10-CM | POA: Diagnosis not present

## 2019-03-16 LAB — COMPREHENSIVE METABOLIC PANEL
ALT: 46 U/L — ABNORMAL HIGH (ref 0–44)
AST: 60 U/L — ABNORMAL HIGH (ref 15–41)
Albumin: 4.3 g/dL (ref 3.5–5.0)
Alkaline Phosphatase: 60 U/L (ref 38–126)
Anion gap: 13 (ref 5–15)
BUN: 19 mg/dL (ref 6–20)
CO2: 22 mmol/L (ref 22–32)
Calcium: 9.7 mg/dL (ref 8.9–10.3)
Chloride: 101 mmol/L (ref 98–111)
Creatinine, Ser: 0.84 mg/dL (ref 0.44–1.00)
GFR calc Af Amer: >60 mL/min (ref >60)
GFR calc non Af Amer: >60 mL/min (ref >60)
Glucose, Bld: 82 mg/dL (ref 70–99)
Potassium: 4.5 mmol/L (ref 3.5–5.1)
Sodium: 136 mmol/L (ref 135–145)
Total Bilirubin: 1.3 mg/dL — ABNORMAL HIGH (ref 0.3–1.2)
Total Protein: 7.2 g/dL (ref 6.5–8.1)

## 2019-03-16 LAB — URINALYSIS, ROUTINE W REFLEX MICROSCOPIC
Bilirubin Urine: NEGATIVE
Glucose, UA: NEGATIVE mg/dL
Hgb urine dipstick: NEGATIVE
Ketones, ur: 5 mg/dL — AB
Leukocytes,Ua: NEGATIVE
Nitrite: NEGATIVE
Protein, ur: NEGATIVE mg/dL
Specific Gravity, Urine: 1.005 (ref 1.005–1.030)
pH: 6 (ref 5.0–8.0)

## 2019-03-16 LAB — CBC WITH DIFFERENTIAL/PLATELET
Abs Immature Granulocytes: 0.04 10*3/uL (ref 0.00–0.07)
Basophils Absolute: 0.1 10*3/uL (ref 0.0–0.1)
Basophils Relative: 1 %
Eosinophils Absolute: 0 10*3/uL (ref 0.0–0.5)
Eosinophils Relative: 0 %
HCT: 41.4 % (ref 36.0–46.0)
Hemoglobin: 13.7 g/dL (ref 12.0–15.0)
Immature Granulocytes: 0 %
Lymphocytes Relative: 20 %
Lymphs Abs: 2.2 10*3/uL (ref 0.7–4.0)
MCH: 29.1 pg (ref 26.0–34.0)
MCHC: 33.1 g/dL (ref 30.0–36.0)
MCV: 88.1 fL (ref 80.0–100.0)
Monocytes Absolute: 0.6 10*3/uL (ref 0.1–1.0)
Monocytes Relative: 6 %
Neutro Abs: 7.9 10*3/uL — ABNORMAL HIGH (ref 1.7–7.7)
Neutrophils Relative %: 73 %
Platelets: 327 10*3/uL (ref 150–400)
RBC: 4.7 MIL/uL (ref 3.87–5.11)
RDW: 12.7 % (ref 11.5–15.5)
WBC: 10.8 10*3/uL — ABNORMAL HIGH (ref 4.0–10.5)
nRBC: 0 % (ref 0.0–0.2)

## 2019-03-16 LAB — TROPONIN I (HIGH SENSITIVITY)
Troponin I (High Sensitivity): 6 ng/L (ref <18)
Troponin I (High Sensitivity): 7 ng/L (ref <18)

## 2019-03-16 LAB — TSH: TSH: 0.45 u[IU]/mL (ref 0.350–4.500)

## 2019-03-16 LAB — T4, FREE: Free T4: 1.23 ng/dL — ABNORMAL HIGH (ref 0.61–1.12)

## 2019-03-16 LAB — D-DIMER, QUANTITATIVE: D-Dimer, Quant: <0.27 ug/mL-FEU (ref 0.00–0.50)

## 2019-03-16 NOTE — ED Notes (Signed)
Pt up to the br 

## 2019-03-16 NOTE — ED Provider Notes (Signed)
[facility] EMERGENCY DEPARTMENT Provider Note   CSN: [id number] Arrival date & time: 03/16/19  1440    History   Chief Complaint Chief Complaint  Patient presents with  . Chest Pain  . Bradycardia    HPI [name] is a 53 y.o. female.     HPI  HR has been going down, normal is 70-80, has been dropping to 42 back to 108, once it gets to the 60s starts to feel nauseas, dizzy HR through apple watch, will have symptoms and then look down and see heart rate low Reports active, diet clean On enbrel for arthritis and started doxycycline for infected cyst right wrist, started it on Thursday and since Friday has had these symptoms.  Thursday went to Rheumatologist for blood work, blood sugar was in 50s, was on methotrexate previously and liver numbers.  Avoids etoh.  Not sure why elevated.  Lightheaded, nauseas, pain around sternum only with breathing out, sharp pain, started on Friday  Has asthma, takes singulair, took inhaler today, feels better, but otherwise feels different than asthma symptoms.  Today was getting into the shower and wasn't feeling well and HR was 42, since Friday has had 10-12 episodes that last a few minutes then it gets better, can be doing anything, walking around grocery store, getting into the shower, watching TV, will feel lightheadedness and nausea and find HR decreased to 40s-60s  Did have some tightness today, sharp pain with breathing out.  For the last few days having some fatigue with exercise but pain not necessarily worsened. Just notices it when breathing out.   No fever, no cough, no vomiting/diarrhea/black or bloody stools, numbness/weakness, no loss of taste or smell, some salty stuff seems less salty  Long time ago did have episode of syncope during wedding fitting  [name] [place] had Cath 2009 which was normal, not aware of LBBB  No smoking, etoh or drugs Fam hx dad with CAD age 52  Had been on doxycycline  after having continued pain at a steroid injection site in her right arm. There was no redness, no fever. Symptoms started the day after she began taking doxycycline  Past Medical History:  Diagnosis Date  . Arthritis   . Asthma   . Frequent headaches   . GERD (gastroesophageal reflux disease)   . H/O seasonal allergies   . History of chicken pox   . Migraines   . Thyroid disease     Patient Active Problem List   Diagnosis Date Noted  . Right wrist pain 03/13/2019  . Hamstring strain, left, initial encounter 02/20/2019  . 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 Primary osteoarthritis of both feet 10/09/2018  . Hx of migraines 09/27/2018  . History of gastroesophageal reflux (GERD) 09/27/2018  . 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 Moderate persistent asthma 07/05/2018  . Hypothyroid 06/28/2018    Past Surgical History:  Procedure Laterality Date  . CARDIAC CATHETERIZATION    . CHOLECYSTECTOMY  1995  . [place]  2009  . HERNIA REPAIR  2017  . LAPAROSCOPIC HYSTERECTOMY  2006  . THYROID SURGERY       OB History   No obstetric history on file.      Home Medications    Prior to Admission medications   Medication Sig Start Date End Date Taking? Authorizing Provider  ADVAIR DISKUS 250-50 MCG/DOSE AEPB Inhale 1 puff into the lungs 2 (two) times daily. 06/28/18   Orma Flaming, MD  albuterol (  PROVENTIL HFA;VENTOLIN HFA) 108 (90 Base) MCG/ACT inhaler Inhale 2 puffs into the lungs every 6 (six) hours as needed for wheezing or shortness of breath. 06/28/18   Orma Flaming, MD  ALLEGRA-D ALLERGY & CONGESTION 180-240 MG 24 hr tablet Take 1 tablet by mouth daily. 06/28/18   Orma Flaming, MD  cholecalciferol (VITAMIN D) 400 units TABS tablet Take 400 Units by mouth.    [provider]  Diclofenac Sodium ([name]) 2 % SOLN Place 1 application onto the skin 2 (two) times daily. 11/21/18   [name], DO  diclofenac sodium (VOLTAREN) 1 %  GEL 3 grams to 3 large joints up to 3 times daily 09/27/18   Bo Merino, MD  doxycycline (VIBRA-TABS) 100 MG tablet Take 1 tablet (100 mg total) by mouth 2 (two) times daily for 10 days. 03/13/19 03/23/19  Lyndal Pulley, DO  Etanercept (ENBREL MINI) 50 MG/ML SOCT Inject 50 mg into the skin once a week. 02/06/19   Bo Merino, MD  folic acid (FOLVITE) 1 MG tablet Take 2 tablets (2 mg total) by mouth daily. 10/29/18   Bo Merino, MD  hydroxychloroquine (PLAQUENIL) 200 MG tablet Take 1 tablet (200 mg total) by mouth 2 (two) times daily. 12/13/18   Ofilia Neas, PA-C  levothyroxine (SYNTHROID) 175 MCG tablet TAKE 1 TABLET BY MOUTH DAILY BEFORE BREAKFASt 03/10/19   Orma Flaming, MD  levothyroxine Wilmer Floor, LEVOTHROID) 150 MCG tablet One tablet by mouth every other day 11/01/18   Orma Flaming, MD  montelukast (SINGULAIR) 10 MG tablet Take 1 tablet (10 mg total) by mouth at bedtime. 06/28/18   Orma Flaming, MD    Family History Family History  Problem Relation Age of Onset  . Arthritis Mother   . Cancer Mother        breast ca  . Miscarriages / [place] Mother   . Diabetes Father   . Heart attack Father   . Heart disease Father   . Hyperlipidemia Father   . Hypertension Father   . Stroke Father        x3  . COPD Sister   . Peripheral Artery Disease Sister   . Asthma Brother   . Birth defects Brother   . Cancer Brother        skin  . Hyperlipidemia Brother   . Heart attack Maternal Grandmother   . Heart disease Maternal Grandmother   . Hyperlipidemia Maternal Grandmother   . Stroke Maternal Grandmother   . Alcohol abuse Paternal Grandmother   . Cancer Paternal Grandmother   . Cancer Paternal Grandfather        breast  . Heart attack Paternal Grandfather   . Alcohol abuse Brother   . Early death Brother        suicide  . Mental illness Brother   . Asthma Daughter   . Asthma Son   . Asthma Son     Social History Social History   Tobacco Use  .  Smoking status: Never Smoker  . Smokeless tobacco: Never Used  Substance Use Topics  . Alcohol use: Yes    Comment: occ  . Drug use: Never     Allergies   Gluten meal, Lortab [hydrocodone-acetaminophen], Shellfish allergy, and Strawberry (diagnostic)   Review of Systems Review of Systems  Constitutional: Positive for fatigue. Negative for appetite change and fever.  HENT: Negative for sore throat.   Eyes: Negative for visual disturbance.  Respiratory: Positive for chest tightness and shortness of breath. Negative for  cough.   Cardiovascular: Positive for chest pain.  Gastrointestinal: Positive for nausea. Negative for abdominal pain and vomiting.  Genitourinary: Positive for dysuria. Negative for difficulty urinating.  Musculoskeletal: Negative for back pain and neck pain.  Skin: Negative for rash.  Neurological: Positive for light-headedness. Negative for syncope, weakness, numbness and headaches.     Physical Exam Updated Vital Signs BP 129/84   Pulse 73   Temp 98.2 F (36.8 C) (Oral)   Resp 19   Ht 5\' 7"  (1.702 m)   Wt 75.8 kg   LMP  (LMP Unknown)   SpO2 98%   BMI 26.16 kg/m   Physical Exam Vitals signs and nursing note reviewed.  Constitutional:      General: She is not in acute distress.    Appearance: She is well-developed. She is not diaphoretic.  HENT:     Head: Normocephalic and atraumatic.  Eyes:     Conjunctiva/sclera: Conjunctivae normal.  Neck:     Musculoskeletal: Normal range of motion.  Cardiovascular:     Rate and Rhythm: Normal rate and regular rhythm.     Heart sounds: Normal heart sounds. No murmur. No friction rub. No gallop.   Pulmonary:     Effort: Pulmonary effort is normal. No respiratory distress.     Breath sounds: Normal breath sounds. No wheezing or rales.  Abdominal:     General: There is no distension.     Palpations: Abdomen is soft.     Tenderness: There is no abdominal tenderness. There is no guarding.  Musculoskeletal:         General: No tenderness.  Skin:    General: Skin is warm and dry.     Findings: No erythema or rash.  Neurological:     Mental Status: She is alert and oriented to person, place, and time.      ED Treatments / Results  Labs (all labs ordered are listed, but only abnormal results are displayed) Labs Reviewed  COMPREHENSIVE METABOLIC PANEL - Abnormal; Notable for the following components:      Result Value   AST 60 (*)    ALT 46 (*)    Total Bilirubin 1.3 (*)    All other components within normal limits  CBC WITH DIFFERENTIAL/PLATELET - Abnormal; Notable for the following components:   WBC 10.8 (*)    Neutro Abs 7.9 (*)    All other components within normal limits  URINALYSIS, ROUTINE W REFLEX MICROSCOPIC - Abnormal; Notable for the following components:   Color, Urine STRAW (*)    Ketones, ur 5 (*)    All other components within normal limits  T4, FREE - Abnormal; Notable for the following components:   Free T4 1.23 (*)    All other components within normal limits  URINE CULTURE  NOVEL CORONAVIRUS, NAA (HOSPITAL ORDER, SEND-OUT TO REF LAB)  TROPONIN I (HIGH SENSITIVITY)  TROPONIN I (HIGH SENSITIVITY)  D-DIMER, QUANTITATIVE (NOT AT [facility])  TSH  T3, FREE    EKG EKG Interpretation  Date/Time:  Sunday March 16 2019 14:46:31 EDT Ventricular Rate:  83 PR Interval:  168 QRS Duration: 136 QT Interval:  408 QTC Calculation: 479 R Axis:   80 Text Interpretation:  Normal sinus rhythm Possible Left atrial enlargement Left bundle branch block Abnormal ECG No previous ECGs available Confirmed by Gareth Morgan [phone]) on 03/16/2019 3:06:48 PM   Radiology Dg Chest Port 1 View  Result Date: 03/16/2019 CLINICAL DATA:  Chest pain EXAM: PORTABLE CHEST 1 VIEW COMPARISON:  October 23, 2018 FINDINGS: No edema or consolidation. Heart size and pulmonary vascularity are normal. No adenopathy. No pneumothorax. No bone lesions. IMPRESSION: No edema or consolidation. Electronically  Signed   By: Lowella Grip III M.D.   On: 03/16/2019 16:11    Procedures Procedures (including critical care time)  Medications Ordered in ED Medications - No data to display   Initial Impression / Assessment and Plan / ED Course  I have reviewed the triage vital signs and the nursing notes.  Pertinent labs & imaging results that were available during my care of the patient were reviewed by me and considered in my medical decision making (see chart for details).        53yo female with history of rheumatoid arthritis, hypothyroidism and asthma presents with concern for episodes of lightheadedness and nausea over the last few days with noted heart rate decrease on her apple watch, in addition to shortness of breath and chest pain when breathing out.  EKG shows LBBB without prior to compare. CXR shows no evidence of pneumonia or pneumothorax. Labs without significant abnormalities. TSH WNL.  DDimer negative, pt low risk [place]. Troponin 6 and 7, pt low risk HEAR score, doubt ACS.  No episodes of bradycardia in the ED. Recommend follow up with Cardiology, likely holter monitoring/ECHO.  Will also send COVID19 test given fatigue, some dyspnea in setting of pandemic. Patient discharged in stable condition with understanding of reasons to return.    Jorene Vary was evaluated in Emergency Department on 03/17/2019 for the symptoms described in the history of present illness. She was evaluated in the context of the global COVID-19 pandemic, which necessitated consideration that the patient might be at risk for infection with the SARS-CoV-2 virus that causes COVID-19. Institutional protocols and algorithms that pertain to the evaluation of patients at risk for COVID-19 are in a state of rapid change based on information released by regulatory bodies including the CDC and federal and state organizations. These policies and algorithms were followed during the patient's care in the ED.   Final  Clinical Impressions(s) / ED Diagnoses   Final diagnoses:  Lightheadedness  Left bundle branch block    ED Discharge Orders    None       Gareth Morgan, MD 03/17/19 1437

## 2019-03-16 NOTE — ED Triage Notes (Signed)
Patient reports starting doxycyline for possible wound infection on arm on Thursday and began having chest pain when exhaling Friday morning. Also noticed her HR has gotten down into the 40s, according to her watch. Resp e/u, skin w/d.

## 2019-03-16 NOTE — Discharge Instructions (Signed)
I recommend you discontinue doxycycline at this time and follow up with Cardiology. It was a pleasure caring for you today!

## 2019-03-17 ENCOUNTER — Encounter: Payer: Self-pay | Admitting: Rheumatology

## 2019-03-17 LAB — URINE CULTURE: Culture: NO GROWTH

## 2019-03-17 NOTE — Telephone Encounter (Signed)
Per Dr. Estanislado Pandy patient is holding medication until she sees her cardiologist.

## 2019-03-17 NOTE — Telephone Encounter (Signed)
Last Visit: 02/06/19 Next Visit: 05/08/19 Labs: 03/16/19 ASt 60, ALT 46 Total bilirubin 1.3 WBC 10.8 Neutro Abs 7.9 No Plaquenil eye exam on file

## 2019-03-18 ENCOUNTER — Telehealth: Payer: Self-pay | Admitting: *Deleted

## 2019-03-18 LAB — T3, FREE: T3, Free: 2.6 pg/mL (ref 2.0–4.4)

## 2019-03-18 LAB — NOVEL CORONAVIRUS, NAA (HOSP ORDER, SEND-OUT TO REF LAB; TAT 18-24 HRS): SARS-CoV-2, NAA: NOT DETECTED

## 2019-03-18 NOTE — Telephone Encounter (Signed)
Called pt informed will send telehealth consent through [facility].  She has old records at [place], [place], Dr. Tacy Dura was her heart doctor. Seen there for chest pain.Marland Kitchen stress test, cath, diagnosed with pleurisy. Felt same this time.  Will send message to medical records to try to obtain records.  LBBB on EKG per pt. Has apple watch. HR will drop into 40s-50s and her legs get a sensation of numbness and like she might pass out.  She is "extremely active" and has been for past 4 years.  She was prescribed doxycycline and wonders if this had any interaction with her other medications.

## 2019-03-19 NOTE — Progress Notes (Signed)
Virtual Visit via Video Note   This visit type was conducted due to national recommendations for restrictions regarding the COVID-19 Pandemic (e.g. social distancing) in an effort to limit this patient's exposure and mitigate transmission in our community.  Due to her co-morbid illnesses, this patient is at least at moderate risk for complications without adequate follow up.  This format is felt to be most appropriate for this patient at this time.  All issues noted in this document were discussed and addressed.  A limited physical exam was performed with this format.  Please refer to the patient's chart for her consent to telehealth for [facility].   Date:  03/19/2019   ID:  [name], DOB 1966-01-19, MRN [id number]  Patient Location: Home Provider Location: Home  PCP:  Orma Flaming, MD  Cardiologist: New  Evaluation Performed:  Consultation - [name] was referred by ER physician for the evaluation of bradycardia and chest pain  Chief Complaint:  Pt referred from ED for evaluation of CP and bradycardia    History of Present Illness:    [name] is a 53 y.o. female who was recently seen in the ED (03/16/19) for chest pain and bradycardia. Pt has apple watch   HR  42 to 108 then back to 60s  On talking to the pt has noticed taht her HR on watch is up and down though watch has said that data is difficult   She says she felt back on Friday  Dizzy   Lightheaded  No syncope   HR was recorded in 40s though problem  Saturday she was walking around   Felt about the same     Went to ED   Labs drawn    Sent hoome   Note urine dilute  Since Saturday she has had a few dizzy spells   She stopped taking Doxycycline on Saturday (had started last week) to see if this was the culprit  The patieht also notes occasional CP   Sharp  L sided   Worse with exhaltion   WOrse if lays on L side  Last seconds   Not associated with other activities   Longstanding    SHe is active  She does  pilates, kickboxing   Does say she felt better earlier in spring than now   Like something is just "off" but she is still able to do things IN gardent does says she gets dizzy if stoop to stand    \  Pt has hx of asthma    Hx cath in 2009 [place], [place])  Normal    The patient does not have symptoms concerning for COVID-19 infection (fever, chills, cough, or new shortness of breath).    Past Medical History:  Diagnosis Date  . Arthritis   . Asthma   . Frequent headaches   . GERD (gastroesophageal reflux disease)   . H/O seasonal allergies   . History of chicken pox   . Migraines   . Thyroid disease    Past Surgical History:  Procedure Laterality Date  . CARDIAC CATHETERIZATION    . CHOLECYSTECTOMY  1995  . [place]  2009  . HERNIA REPAIR  2017  . LAPAROSCOPIC HYSTERECTOMY  2006  . THYROID SURGERY       No outpatient medications have been marked as taking for the 03/20/19 encounter (Appointment) with Fay Records, MD.     Allergies:   Gluten meal, Lortab [hydrocodone-acetaminophen], Shellfish allergy, and Strawberry (diagnostic)  Social History   Tobacco Use  . Smoking status: Never Smoker  . Smokeless tobacco: Never Used  Substance Use Topics  . Alcohol use: Yes    Comment: occ  . Drug use: Never     Family Hx: The patient's family history includes Alcohol abuse in her brother and paternal grandmother; Arthritis in her mother; Asthma in her brother, daughter, son, and son; Birth defects in her brother; COPD in her sister; Cancer in her brother, mother, paternal grandfather, and paternal grandmother; Diabetes in her father; Early death in her brother; Heart attack in her father, maternal grandmother, and paternal grandfather; Heart disease in her father and maternal grandmother; Hyperlipidemia in her brother, father, and maternal grandmother; Hypertension in her father; Mental illness in her brother; Miscarriages / Stillbirths in her mother; Peripheral Artery  Disease in her sister; Stroke in her father and maternal grandmother.  ROS:   Please see the history of present illness.     All other systems reviewed and are negative.   Prior CV studies:   The following studies were reviewed today:    Labs/Other Tests and Data Reviewed:    EKG:  An ECG dated 6/28 was personally reviewed today and demonstrated:  SR   LBBB  Recent Labs: 03/16/2019: ALT 46; BUN 19; Creatinine, Ser 0.84; Hemoglobin 13.7; Platelets 327; Potassium 4.5; Sodium 136; TSH 0.450   Recent Lipid Panel Lab Results  Component Value Date/Time   CHOL 164 06/28/2018 09:38 AM   TRIG 85.0 06/28/2018 09:38 AM   HDL 56.20 06/28/2018 09:38 AM   CHOLHDL 3 06/28/2018 09:38 AM   LDLCALC 90 06/28/2018 09:38 AM    Wt Readings from Last 3 Encounters:  03/16/19 167 lb (75.8 kg)  03/13/19 167 lb (75.8 kg)  02/06/19 171 lb (77.6 kg)     Objective:    Vital Signs:  LMP  (LMP Unknown)    No vital signs to review    ASSESSMENT & PLAN:    1. Bradycardia   I am not sure that data is reliable  IT is from APple 4 watch  She said the watch had some difficulty with getting data I would recomm a 48 hour holter monitor to define Again, she may have relative bradycardia at some points   IF she is symptomatic with dizziness then she should click the symptoms to see if correlation can be made.  2  CP   Atypical for angina   Sharp   Transient   May be musculoskel   May be related to reactive airways   Follow   She is very active aerobically   SOme days not as good but she has other days good   Follow  The pt will get call from office It would be good to see later this summer to get orthostatic check  And BP   I have encouraged her to buy a BP cuff    COVID-19 Education: The signs and symptoms of COVID-19 were discussed with the patient and how to seek care for testing (follow up with PCP or arrange E-visit).  The importance of social distancing was discussed today.  Time:   Today, I  have spent 25  minutes with the patient with telehealth technology discussing the above problems.     Medication Adjustments/Labs and Tests Ordered: Current medicines are reviewed at length with the patient today.  Concerns regarding medicines are outlined above.   Tests Ordered: No orders of the defined types were placed in this  encounter.   Medication Changes: No orders of the defined types were placed in this encounter.   Follow Up: Based on test results  Signed, Dorris Carnes, MD  03/19/2019 11:03 PM    [place]

## 2019-03-20 ENCOUNTER — Encounter: Payer: Self-pay | Admitting: Internal Medicine

## 2019-03-20 ENCOUNTER — Telehealth (INDEPENDENT_AMBULATORY_CARE_PROVIDER_SITE_OTHER): Payer: BC Managed Care – PPO | Admitting: Internal Medicine

## 2019-03-20 ENCOUNTER — Telehealth: Payer: Self-pay | Admitting: Radiology

## 2019-03-20 ENCOUNTER — Other Ambulatory Visit: Payer: Self-pay

## 2019-03-20 VITALS — HR 84 | Ht 67.0 in | Wt 166.0 lb

## 2019-03-20 DIAGNOSIS — R079 Chest pain, unspecified: Secondary | ICD-10-CM

## 2019-03-20 DIAGNOSIS — R001 Bradycardia, unspecified: Secondary | ICD-10-CM

## 2019-03-20 DIAGNOSIS — R0789 Other chest pain: Secondary | ICD-10-CM | POA: Diagnosis not present

## 2019-03-20 NOTE — Telephone Encounter (Signed)
Enrolled patient for a 3 day Zio monitor to be mailed. Brief instructions were gone over with the patient and she knows to expect the monitor to arrive in 3-4 days. *48hr holter was changed to a 3 day Zio so monitor could be mailed due to covid

## 2019-03-20 NOTE — Patient Instructions (Signed)
Medication Instructions:  Your physician recommends that you continue on your current medications as directed. Please refer to the Current Medication list given to you today.  If you need a refill on your cardiac medications before your next appointment, please call your pharmacy.   Lab work: None Ordered  If you have labs (blood work) drawn today and your tests are completely normal, you will receive your results only by: Marland Kitchen MyChart Message (if you have MyChart) OR . A paper copy in the mail If you have any lab test that is abnormal or we need to change your treatment, we will call you to review the results.  Testing/Procedures: Your physician has recommended that you wear a 48 hour holter monitor. You will be contacted to arrange. Holter monitors are medical devices that record the heart's electrical activity. Doctors most often use these monitors to diagnose arrhythmias. Arrhythmias are problems with the speed or rhythm of the heartbeat. The monitor is a small, portable device. You can wear one while you do your normal daily activities. This is usually used to diagnose what is causing palpitations/syncope (passing out).  Follow-Up: . Follow up with Dr. Harrington Challenger in the office on 04/21/19 at 1:40 PM. We will perform orthostatic blood pressures at this visit.  Any Other Special Instructions Will Be Listed Below (If Applicable).

## 2019-03-21 NOTE — Progress Notes (Signed)
Cardiology Office Note   Date:  03/21/2019   ID:  [name], DOB 09/03/1966, MRN [id number]  PCP:  Orma Flaming, MD  Cardiologist:   Dorris Carnes, MD       History of Present Illness: [name] is a 53 y.o. female with a history of       Current Meds  Medication Sig  . ADVAIR DISKUS 250-50 MCG/DOSE AEPB Inhale 1 puff into the lungs 2 (two) times daily.  Marland Kitchen albuterol (PROVENTIL HFA;VENTOLIN HFA) 108 (90 Base) MCG/ACT inhaler Inhale 2 puffs into the lungs every 6 (six) hours as needed for wheezing or shortness of breath.  Lianne Moris ALLERGY & CONGESTION 180-240 MG 24 hr tablet Take 1 tablet by mouth daily.  . cholecalciferol (VITAMIN D) 400 units TABS tablet Take 5,000 Units by mouth.   . Diclofenac Sodium ([name]) 2 % SOLN Place 1 application onto the skin 2 (two) times daily. (Patient taking differently: Place 1 application onto the skin as directed. )  . diclofenac sodium (VOLTAREN) 1 % GEL 3 grams to 3 large joints up to 3 times daily  . Etanercept (ENBREL MINI) 50 MG/ML SOCT Inject 50 mg into the skin once a week.  . hydroxychloroquine (PLAQUENIL) 200 MG tablet Take 1 tablet (200 mg total) by mouth 2 (two) times daily.  Marland Kitchen levothyroxine (SYNTHROID) 175 MCG tablet TAKE 1 TABLET BY MOUTH DAILY BEFORE BREAKFASt  . levothyroxine (SYNTHROID, LEVOTHROID) 150 MCG tablet One tablet by mouth every other day  . montelukast (SINGULAIR) 10 MG tablet Take 1 tablet (10 mg total) by mouth at bedtime.     Allergies:   Gluten meal, Lortab [hydrocodone-acetaminophen], Shellfish allergy, and Strawberry (diagnostic)   Past Medical History:  Diagnosis Date  . Arthritis   . Asthma   . Frequent headaches   . GERD (gastroesophageal reflux disease)   . H/O seasonal allergies   . History of chicken pox   . Migraines   . Thyroid disease     Past Surgical History:  Procedure Laterality Date  . CARDIAC CATHETERIZATION    . CHOLECYSTECTOMY  1995  . [place]  2009  .  HERNIA REPAIR  2017  . LAPAROSCOPIC HYSTERECTOMY  2006  . THYROID SURGERY       Social History:  The patient  reports that she has never smoked. She has never used smokeless tobacco. She reports current alcohol use. She reports that she does not use drugs.   Family History:  The patient's family history includes Alcohol abuse in her brother and paternal grandmother; Arthritis in her mother; Asthma in her brother, daughter, son, and son; Birth defects in her brother; COPD in her sister; Cancer in her brother, mother, paternal grandfather, and paternal grandmother; Diabetes in her father; Early death in her brother; Heart attack in her father, maternal grandmother, and paternal grandfather; Heart disease in her father and maternal grandmother; Hyperlipidemia in her brother, father, and maternal grandmother; Hypertension in her father; Mental illness in her brother; Miscarriages / Stillbirths in her mother; Peripheral Artery Disease in her sister; Stroke in her father and maternal grandmother.    ROS:  Please see the history of present illness. All other systems are reviewed and  Negative to the above problem except as noted.    PHYSICAL EXAM: VS:  Pulse 84   Ht 5\' 7"  (1.702 m)   Wt 166 lb (75.3 kg)   LMP  (LMP Unknown)   BMI 26.00 kg/m   GEN: Well nourished,  well developed, in no acute distress  HEENT: normal  Neck: no JVD, carotid bruits, or masses Cardiac: RRR; no murmurs, rubs, or gallops,no edema  Respiratory:  clear to auscultation bilaterally, normal work of breathing GI: soft, nontender, nondistended, + BS  No hepatomegaly  MS: no deformity Moving all extremities   Skin: warm and dry, no rash Neuro:  Strength and sensation are intact Psych: euthymic mood, full affect   EKG:  EKG is ordered today.   Lipid Panel    Component Value Date/Time   CHOL 164 06/28/2018 0938   TRIG 85.0 06/28/2018 0938   HDL 56.20 06/28/2018 0938   CHOLHDL 3 06/28/2018 0938   VLDL 17.0  06/28/2018 0938   LDLCALC 90 06/28/2018 0938      Wt Readings from Last 3 Encounters:  03/20/19 166 lb (75.3 kg)  03/16/19 167 lb (75.8 kg)  03/13/19 167 lb (75.8 kg)      ASSESSMENT AND PLAN:     Current medicines are reviewed at length with the patient today.  The patient does not have concerns regarding medicines.  Signed, Dorris Carnes, MD  03/21/2019 8:23 AM    [facility] [place], [place], [place]  [address] Phone: [phone]; Fax: [phone]

## 2019-03-26 ENCOUNTER — Ambulatory Visit (INDEPENDENT_AMBULATORY_CARE_PROVIDER_SITE_OTHER): Payer: BC Managed Care – PPO

## 2019-03-26 DIAGNOSIS — R001 Bradycardia, unspecified: Secondary | ICD-10-CM

## 2019-04-08 ENCOUNTER — Other Ambulatory Visit: Payer: Self-pay

## 2019-04-08 ENCOUNTER — Other Ambulatory Visit: Payer: Self-pay | Admitting: Internal Medicine

## 2019-04-08 DIAGNOSIS — R001 Bradycardia, unspecified: Secondary | ICD-10-CM

## 2019-04-09 DIAGNOSIS — H524 Presbyopia: Secondary | ICD-10-CM | POA: Diagnosis not present

## 2019-04-09 DIAGNOSIS — Z79899 Other long term (current) drug therapy: Secondary | ICD-10-CM | POA: Diagnosis not present

## 2019-04-09 DIAGNOSIS — H52203 Unspecified astigmatism, bilateral: Secondary | ICD-10-CM | POA: Diagnosis not present

## 2019-04-09 DIAGNOSIS — H5213 Myopia, bilateral: Secondary | ICD-10-CM | POA: Diagnosis not present

## 2019-04-10 ENCOUNTER — Ambulatory Visit (INDEPENDENT_AMBULATORY_CARE_PROVIDER_SITE_OTHER): Payer: Self-pay | Admitting: Family Medicine

## 2019-04-10 ENCOUNTER — Other Ambulatory Visit: Payer: Self-pay

## 2019-04-10 ENCOUNTER — Ambulatory Visit: Payer: Self-pay

## 2019-04-10 ENCOUNTER — Encounter: Payer: Self-pay | Admitting: Family Medicine

## 2019-04-10 VITALS — BP 122/82 | HR 81 | Ht 67.0 in | Wt 168.2 lb

## 2019-04-10 DIAGNOSIS — M25531 Pain in right wrist: Secondary | ICD-10-CM

## 2019-04-10 NOTE — Assessment & Plan Note (Signed)
Patient is 50% better.  Doxycycline seem to have helped if there was any type of infectious etiology with patient being immunocompromised.  Seems to be doing better overall and could also have been a rheumatoid nodule that is now resolving.  Discussed the possibility of advanced imaging but with her improving patient would like to continue with conservative therapy.  Follow-up with me again in 8 weeks

## 2019-04-10 NOTE — Progress Notes (Signed)
[facility] [place], [place] [address] Phone: [phone] Subjective:    I'm seeing this patient by the request  of:    I, Wendy Poet, LAT, ATC, am serving as scribe for Dr. Hulan Saas.  CC: Right wrist pain follow-up  MEQ:[id number]    03/13/19: Patient does have right wrist pain.  Has been going on for greater than 3 months.  Did have an injection in the unfortunately caused worsening pain and since then has had more difficulty.  Patient describes the pain is severe.  Patient states that the erythema has been there since the injection and at this point we will treat her with the possibility of medications for antibiotics.  Low likelihood.  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  Discussed with patient again at great length.  Discussed home exercises and icing regimen.  Patient will try the conservative therapy and follow-up with me again in 4 to 8 weeks.  Worsening pain consider advanced imaging  04/10/19: [name] is a 53 y.o. female coming in with complaint of R wrist pain.  Pt reports some improvement but notes continued stiffness.  Pt states she feels approximately 50% improved.  Pt has been doing her HEP which is mainly for her L h/s.  Pt states that she believes that she may have had a negative reaction to the doxycycline and ended up at the hospital due to issues she was having w/ her pulse.  Pt has since had a Halter monitor but is waiting for the results of that test.  Patient states feeling approximately 50% better at this point.  Patient states the wrist seems to be tight but nothing severe at the moment.  Has been able to increase activity.  Patient seems to be is completely resolved at this time.      Past Medical History:  Diagnosis Date  . Arthritis   . Asthma   . Frequent headaches   . GERD (gastroesophageal reflux disease)   . H/O seasonal allergies   .  History of chicken pox   . Migraines   . Thyroid disease    Past Surgical History:  Procedure Laterality Date  . CARDIAC CATHETERIZATION    . CHOLECYSTECTOMY  1995  . [place]  2009  . HERNIA REPAIR  2017  . LAPAROSCOPIC HYSTERECTOMY  2006  . THYROID SURGERY     Social History   Socioeconomic History  . Marital status: Married    Spouse name: Not on file  . Number of children: Not on file  . Years of education: Not on file  . Highest education level: Not on file  Occupational History  . Not on file  Social Needs  . Financial resource strain: Not on file  . Food insecurity    Worry: Not on file    Inability: Not on file  . Transportation needs    Medical: Not on file    Non-medical: Not on file  Tobacco Use  . Smoking status: Never Smoker  . Smokeless tobacco: Never Used  Substance and Sexual Activity  . Alcohol use: Yes    Comment: occ  . Drug use: Never  . Sexual activity: Yes    Partners: Male  Lifestyle  . Physical activity    Days per week: Not on file    Minutes per session: Not on file  . Stress: Not on file  Relationships  . Social Herbalist on phone: Not on file    Gets together: Not on file    Attends religious service: Not on file    Active member of club or organization: Not on file    Attends meetings of clubs or organizations: Not on file    Relationship status: Not on file  Other Topics Concern  . Not on file  Social History Narrative  . Not on file   Allergies  Allergen Reactions  . Gluten Meal     Bloody diarrhea and hives  . Lortab [Hydrocodone-Acetaminophen]     Swelling of tongue  . Shellfish Allergy Hives  . Strawberry (Diagnostic)     Hazelnuts, okra   Family History  Problem Relation Age of Onset  . Arthritis Mother   . Cancer Mother        breast ca  . Miscarriages / [place] Mother   . Diabetes Father   . Heart attack Father   . Heart disease Father   . Hyperlipidemia Father   . Hypertension  Father   . Stroke Father        x3  . COPD Sister   . Peripheral Artery Disease Sister   . Asthma Brother   . Birth defects Brother   . Cancer Brother        skin  . Hyperlipidemia Brother   . Heart attack Maternal Grandmother   . Heart disease Maternal Grandmother   . Hyperlipidemia Maternal Grandmother   . Stroke Maternal Grandmother   . Alcohol abuse Paternal Grandmother   . Cancer Paternal Grandmother   . Cancer Paternal Grandfather        breast  . Heart attack Paternal Grandfather   . Alcohol abuse Brother   . Early death Brother        suicide  . Mental illness Brother   . Asthma Daughter   . Asthma Son   . Asthma Son     Current Outpatient Medications (Endocrine & Metabolic):  .  levothyroxine (SYNTHROID) 175 MCG tablet, TAKE 1 TABLET BY MOUTH DAILY BEFORE BREAKFASt .  levothyroxine (SYNTHROID, LEVOTHROID) 150 MCG tablet, One tablet by mouth every other day   Current Outpatient Medications (Respiratory):  Marland Kitchen  ADVAIR DISKUS 250-50 MCG/DOSE AEPB, Inhale 1 puff into the lungs 2 (two) times daily. Marland Kitchen  albuterol (PROVENTIL HFA;VENTOLIN HFA) 108 (90 Base) MCG/ACT inhaler, Inhale 2 puffs into the lungs every 6 (six) hours as needed for wheezing or shortness of breath. Lianne Moris ALLERGY & CONGESTION 180-240 MG 24 hr tablet, Take 1 tablet by mouth daily. .  montelukast (SINGULAIR) 10 MG tablet, Take 1 tablet (10 mg total) by mouth at bedtime.  Current Outpatient Medications (Analgesics):  Marland Kitchen  Etanercept (ENBREL MINI) 50 MG/ML SOCT, Inject 50 mg into the skin once a week.   Current Outpatient Medications (Other):  .  cholecalciferol (VITAMIN D) 400 units TABS tablet, Take 5,000 Units by mouth.  .  Diclofenac Sodium ([name]) 2 % SOLN, Place 1 application onto the skin 2 (two) times daily. (Patient taking differently: Place 1 application onto the skin as directed. ) .  hydroxychloroquine (PLAQUENIL) 200 MG tablet, Take 1 tablet (200 mg total) by mouth 2 (two) times daily.  .  diclofenac sodium (VOLTAREN) 1 % GEL, 3 grams to 3 large joints up to 3 times daily (Patient not taking: Reported on 04/10/2019)    Past medical history, social, surgical and family history all reviewed in  electronic medical record.  No pertanent information unless stated regarding to the chief complaint.   Review of Systems:  No headache, visual changes, nausea, vomiting, diarrhea, constipation, dizziness, abdominal pain, skin rash, fevers, chills, night sweats, weight loss, swollen lymph nodes, body aches, joint swelling, muscle aches, chest pain, shortness of breath, mood changes.   Objective  Blood pressure 122/82, pulse 81, height 5\' 7"  (1.702 m), weight 168 lb 3.2 oz (76.3 kg), SpO2 99 %. Systems examined below as of    General: No apparent distress alert and oriented x3 mood and affect normal, dressed appropriately.  HEENT: Pupils equal, extraocular movements intact  Respiratory: Patient's speak in full sentences and does not appear short of breath  Cardiovascular: No lower extremity edema, non tender, no erythema  Skin: Warm dry intact with no signs of infection or rash on extremities or on axial skeleton.  Abdomen: Soft nontender  Neuro: Cranial nerves II through XII are intact, neurovascularly intact in all extremities with 2+ DTRs and 2+ pulses.  Lymph: No lymphadenopathy of posterior or anterior cervical chain or axillae bilaterally.  Gait normal with good balance and coordination.  MSK:  Non tender with full range of motion and good stability and symmetric strength and tone of shoulders, elbows, hip, knee and ankles bilaterally.  Right wrist exam has significant decrease in inflammation and erythema that was seen previously.  Patient still has some mild stiffness with extension of the wrist.  Patient has good grip strength.  No pain over the scaphoid bone.  Neurovascular intact distally.  Good capillary refill.  Limited musculoskeletal ultrasound was performed and interpreted  by Lyndal Pulley  Limited ultrasound shows the patient still has some abnormality noted on the dorsal aspect of the wrist.  Seems to be under the extensor tendon.  Hypoechoic changes and increasing Doppler flow still noted.  Does not appear to be infectious etiology anymore.  Potentially a resolving rheumatoid nodule.    Impression and Recommendations:     This case required medical decision making of moderate complexity. The above documentation has been reviewed and is accurate and complete Lyndal Pulley, DO       Note: This dictation was prepared with Dragon dictation along with smaller phrase technology. Any transcriptional errors that result from this process are unintentional.

## 2019-04-10 NOTE — Patient Instructions (Signed)
Keep up with the same program. See me again in 6 weeks.

## 2019-04-13 ENCOUNTER — Other Ambulatory Visit: Payer: Self-pay | Admitting: Physician Assistant

## 2019-04-13 ENCOUNTER — Encounter: Payer: Self-pay | Admitting: Rheumatology

## 2019-04-13 DIAGNOSIS — M1230 Palindromic rheumatism, unspecified site: Secondary | ICD-10-CM

## 2019-04-14 NOTE — Telephone Encounter (Signed)
Last Visit: 02/06/19 Next Visit: 05/08/19 Labs: 03/16/19 ASt 60, ALT 46 Total bilirubin 1.3 WBC 10.8 Neutro Abs 7.9 PLQ Eye Exam: 04/09/19 WNL  Okay to refill per Dr. Estanislado Pandy

## 2019-04-15 ENCOUNTER — Telehealth: Payer: Self-pay | Admitting: Pharmacist

## 2019-04-15 NOTE — Telephone Encounter (Signed)
Received call from patient with questions about Shingrix vaccine.  She was on a waitlist at her local pharmacy and got a call stating the vaccine was now available.  She called to double check if she is able to get Shignrix while taking Enbrel.  Informed patient that she may receive the vaccine while taking Enbrel. There might be a slight decrease in immunity built but not enough to warrant holding or delaying initiation of therapy. Patient verbalized understanding.  She was also told that she is due for a tetanus booster.  Reviewed patient immunizations and she received tdap in October 2019 and is not due for a booster at this time.  Patient verbalized understanding.  All questions encouraged and answered.  Instructed patient to call with any further questions or concerns.  Mariella Saa, PharmD, [place], [place] Clinical Specialty Pharmacist [phone]  04/15/2019 9:30 AM

## 2019-04-18 ENCOUNTER — Telehealth: Payer: Self-pay

## 2019-04-18 NOTE — Telephone Encounter (Signed)
No answer when called to go over the covid 19 screening questions

## 2019-04-19 NOTE — Progress Notes (Signed)
Cardiology Office Note   Date:  04/21/2019   ID:  [name], DOB [birth year], MRN [id number]  PCP:  Orma Flaming, MD  Cardiologist:   Dorris Carnes, MD   PT presents for f/u of dizziness   History of Present Illness: [name] is a 53 y.o. female with a history of chest pain and bradycardia  And dizziness    The pt was seen on [place] ED on 03/16/19   Had CP that was pleuritic and position.    ALso said her APple watch had HR in 40s    She compliained of some dizziness   No syncope   HR was slow at time   She had finished course of doxycycline   The pt said at televisit that she was very active [place] "off"   Dizzy with gardening  When I spoke to her on phone for televisit Irecomm she increased her fluid and salt intake and plan for follow up  Since I spoke with her she is feeling some better   Not as dizzy   Still with stoop to stand gets dizzy  Problems in garden but still ablve to exercise  The pt was set up for 48 hour holter.   This showed SR to sT 60 to 178 bpm  Average HR was 84 BPM REcomm f/u in clinic for orthostatic BP    Denies CP   Breathing is OK       Current Meds  Medication Sig  . ADVAIR DISKUS 250-50 MCG/DOSE AEPB Inhale 1 puff into the lungs 2 (two) times daily.  Marland Kitchen albuterol (PROVENTIL HFA;VENTOLIN HFA) 108 (90 Base) MCG/ACT inhaler Inhale 2 puffs into the lungs every 6 (six) hours as needed for wheezing or shortness of breath.  Lianne Moris ALLERGY & CONGESTION 180-240 MG 24 hr tablet Take 1 tablet by mouth daily.  . cholecalciferol (VITAMIN D) 400 units TABS tablet Take 5,000 Units by mouth.   . Diclofenac Sodium ([name]) 2 % SOLN Place 1 application onto the skin 2 (two) times daily. (Patient taking differently: Place 1 application onto the skin as directed. )  . Etanercept (ENBREL MINI) 50 MG/ML SOCT Inject 50 mg into the skin once a week.  . hydroxychloroquine (PLAQUENIL) 200 MG tablet TAKE 1 TABLET BY MOUTH TWICE A DAY  .  levothyroxine (SYNTHROID) 175 MCG tablet TAKE 1 TABLET BY MOUTH DAILY BEFORE BREAKFASt  . levothyroxine (SYNTHROID, LEVOTHROID) 150 MCG tablet One tablet by mouth every other day  . montelukast (SINGULAIR) 10 MG tablet Take 1 tablet (10 mg total) by mouth at bedtime.     Allergies:   Gluten meal, Lortab [hydrocodone-acetaminophen], Morphine and related, Shellfish allergy, and Strawberry (diagnostic)   Past Medical History:  Diagnosis Date  . Arthritis   . Asthma   . Frequent headaches   . GERD (gastroesophageal reflux disease)   . H/O seasonal allergies   . History of chicken pox   . Migraines   . Thyroid disease     Past Surgical History:  Procedure Laterality Date  . CARDIAC CATHETERIZATION    . CHOLECYSTECTOMY  1995  . [place]  2009  . HERNIA REPAIR  2017  . LAPAROSCOPIC HYSTERECTOMY  2006  . THYROID SURGERY       Social History:  The patient  reports that she has never smoked. She has never used smokeless tobacco. She reports current alcohol use. She reports that she does not use drugs.   Family  History:  The patient's family history includes Alcohol abuse in her brother and paternal grandmother; Arthritis in her mother; Asthma in her brother, daughter, son, and son; Birth defects in her brother; COPD in her sister; Cancer in her brother, mother, paternal grandfather, and paternal grandmother; Diabetes in her father; Early death in her brother; Heart attack in her father, maternal grandmother, and paternal grandfather; Heart disease in her father and maternal grandmother; Hyperlipidemia in her brother, father, and maternal grandmother; Hypertension in her father; Mental illness in her brother; Miscarriages / Stillbirths in her mother; Peripheral Artery Disease in her sister; Stroke in her father and maternal grandmother.    ROS:  Please see the history of present illness. All other systems are reviewed and  Negative to the above problem except as noted.     PHYSICAL EXAM: VS:  BP 127/76   Pulse 73   Ht 5\' 7"  (1.702 m)   Wt 170 lb (77.1 kg)   LMP  (LMP Unknown)   BMI 26.63 kg/m   GEN: Well nourished, well developed, in no acute distress  HEENT: normal  Neck: no JVD, carotid bruits, or masses Cardiac: RRR; no murmurs, rubs, or gallops,no edema  Respiratory:  clear to auscultation bilaterally, normal work of breathing GI: soft, nontender, nondistended, + BS  No hepatomegaly  MS: no deformity Moving all extremities   Skin: warm and dry, no rash Neuro:  Strength and sensation are intact Psych: euthymic mood, full affect   EKG:  EKG is not ordered today.  PRevious:  SR with LBBB   Lipid Panel    Component Value Date/Time   CHOL 164 06/28/2018 0938   TRIG 85.0 06/28/2018 0938   HDL 56.20 06/28/2018 0938   CHOLHDL 3 06/28/2018 0938   VLDL 17.0 06/28/2018 0938   LDLCALC 90 06/28/2018 0938      Wt Readings from Last 3 Encounters:  04/21/19 170 lb (77.1 kg)  04/10/19 168 lb 3.2 oz (76.3 kg)  03/20/19 166 lb (75.3 kg)      ASSESSMENT AND PLAN:  1  Bradycardia   I am not convinced of any bradycardia   I have looked at some of Apple watch tracings  I do not think it is picking up QRS complexes   Not optimal tracing    Holter OK  Recomm:  Try other wrist  2  Dizziness   Orthostatics today are negative   I do think she has tendencies for orthostatic hypotensin  Continue to push fluids and salt Will check labs today (CBC, BMET, TSH)   Talke to PCP or gynecologist re menapause as this may be exacerbating (plt s/p TAH)  3   LBBB   REviewed with EP   Even though pt is very active would recomm lexiscan myovue to r/o ischemia, define LVEF       Current medicines are reviewed at length with the patient today.  The patient does not have concerns regarding medicines.  Signed, Dorris Carnes, MD  04/21/2019 2:07 PM    [facility] [address], [place], [place]  [address] Phone: [phone]; Fax: [phone]

## 2019-04-21 ENCOUNTER — Encounter: Payer: Self-pay | Admitting: Internal Medicine

## 2019-04-21 ENCOUNTER — Ambulatory Visit: Payer: BC Managed Care – PPO | Admitting: Internal Medicine

## 2019-04-21 ENCOUNTER — Other Ambulatory Visit: Payer: Self-pay

## 2019-04-21 ENCOUNTER — Encounter: Payer: Self-pay | Admitting: Family Medicine

## 2019-04-21 ENCOUNTER — Encounter: Payer: Self-pay | Admitting: *Deleted

## 2019-04-21 VITALS — BP 127/76 | HR 73 | Ht 67.0 in | Wt 170.0 lb

## 2019-04-21 DIAGNOSIS — R9431 Abnormal electrocardiogram [ECG] [EKG]: Secondary | ICD-10-CM

## 2019-04-21 DIAGNOSIS — I447 Left bundle-branch block, unspecified: Secondary | ICD-10-CM | POA: Diagnosis not present

## 2019-04-21 NOTE — Patient Instructions (Signed)
Medication Instructions:  Your physician recommends that you continue on your current medications as directed. Please refer to the Current Medication list given to you today.  If you need a refill on your cardiac medications before your next appointment, please call your pharmacy.   Lab work: None If you have labs (blood work) drawn today and your tests are completely normal, you will receive your results only by: Marland Kitchen MyChart Message (if you have MyChart) OR . A paper copy in the mail If you have any lab test that is abnormal or we need to change your treatment, we will call you to review the results.  Testing/Procedures: Your physician has requested that you have a lexiscan myoview. For further information please visit [email or web address]. Please follow instruction sheet, as given.   Follow-Up: Your follow up will be based on the results of your test.  Any Other Special Instructions Will Be Listed Below (If Applicable).

## 2019-04-24 ENCOUNTER — Telehealth: Payer: Self-pay

## 2019-04-24 NOTE — Progress Notes (Signed)
Office Visit Note  Patient: [name]             Date of Birth: 10-Jan-1966           MRN: [id number]             PCP: Orma Flaming, MD Referring: Orma Flaming, MD Visit Date: 05/08/2019 Occupation: @GUAROCC @  Subjective:  Joint stiffness.    History of Present Illness: [name] is a 53 y.o. female with history of rheumatoid arthritis.  She states she had a infection on her right wrist for which she was given doxycycline.  She states she could not tolerate the medication and stopped it.  It resolved by itself.  She states she noticed that she was having some bradycardia episodes on her apple watch.  She was seen by her cardiologist who did not feel that she was having bradycardia.  She thought she was having some vasovagal episodes.  She also had a stress test.  The results have not been discussed with the patient yet.  Patient states she feels some stiffness in her joints just prior to the Enbrel injection.  She was switched from methotrexate to Plaquenil due to elevation in her LFTs.  Activities of Daily Living:  Patient reports morning stiffness for several hours.   Patient Denies nocturnal pain.  Difficulty dressing/grooming: Denies Difficulty climbing stairs: Denies Difficulty getting out of chair: Denies Difficulty using hands for taps, buttons, cutlery, and/or writing: Denies  Review of Systems  Constitutional: Negative for fatigue.  HENT: Positive for mouth dryness. Negative for mouth sores and nose dryness.   Eyes: Positive for dryness. Negative for pain and itching.  Respiratory: Positive for wheezing and difficulty breathing.        Due to asthma   Cardiovascular: Negative for palpitations and swelling in legs/feet.  Gastrointestinal: Negative for abdominal pain, blood in stool, constipation and diarrhea.  Endocrine: Negative for increased urination.  Genitourinary: Negative for difficulty urinating and painful urination.  Musculoskeletal: Positive for  arthralgias, joint pain and morning stiffness. Negative for joint swelling.  Skin: Negative for rash, hair loss and redness.  Allergic/Immunologic: Negative for susceptible to infections.  Neurological: Positive for dizziness and headaches. Negative for memory loss and weakness.  Hematological: Negative for swollen glands.  Psychiatric/Behavioral: Positive for sleep disturbance. Negative for confusion.    PMFS History:  Patient Active Problem List   Diagnosis Date Noted  . Left bundle branch block 04/28/2019  . Right wrist pain 03/13/2019  . Hamstring strain, left, initial encounter 02/20/2019  . 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 Primary osteoarthritis of both feet 10/09/2018  . Hx of migraines 09/27/2018  . History of gastroesophageal reflux (GERD) 09/27/2018  . 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 Moderate persistent asthma 07/05/2018  . Hypothyroid 06/28/2018    Past Medical History:  Diagnosis Date  . Arthritis   . Asthma   . Frequent headaches   . GERD (gastroesophageal reflux disease)   . H/O seasonal allergies   . History of chicken pox   . Migraines   . Thyroid disease     Family History  Problem Relation Age of Onset  . Arthritis Mother   . Cancer Mother        breast ca  . Miscarriages / [place] Mother   . Diabetes Father   . Heart attack Father   . Heart disease Father   . Hyperlipidemia Father   . Hypertension Father   . Stroke Father  x3  . COPD Sister   . Peripheral Artery Disease Sister   . Asthma Brother   . Birth defects Brother   . Cancer Brother        skin  . Hyperlipidemia Brother   . Heart attack Maternal Grandmother   . Heart disease Maternal Grandmother   . Hyperlipidemia Maternal Grandmother   . Stroke Maternal Grandmother   . Alcohol abuse Paternal Grandmother   . Cancer Paternal Grandmother   . Cancer Paternal Grandfather        breast  . Heart attack Paternal Grandfather   . Alcohol abuse  Brother   . Early death Brother        suicide  . Mental illness Brother   . Asthma Daughter   . Asthma Son   . Asthma Son    Past Surgical History:  Procedure Laterality Date  . CARDIAC CATHETERIZATION    . CHOLECYSTECTOMY  1995  . [place]  2009  . HERNIA REPAIR  2017  . LAPAROSCOPIC HYSTERECTOMY  2006  . THYROID SURGERY     Social History   Social History Narrative  . Not on file   Immunization History  Administered Date(s) Administered  . Influenza,inj,Quad PF,6+ Mos 06/28/2018  . Influenza-Unspecified 01/25/2018  . Pneumococcal Polysaccharide-23 10/22/2018  . Tdap 06/28/2018  . Zoster Recombinat (Shingrix) 04/15/2019     Objective: Vital Signs: BP 123/77 (BP Location: Left Arm, Patient Position: Sitting, Cuff Size: Normal)   Pulse 83   Resp 12   Ht 5\' 7"  (1.702 m)   Wt 168 lb (76.2 kg)   LMP  (LMP Unknown)   BMI 26.31 kg/m    Physical Exam Vitals signs and nursing note reviewed.  Constitutional:      Appearance: She is well-developed.  HENT:     Head: Normocephalic and atraumatic.  Eyes:     Conjunctiva/sclera: Conjunctivae normal.  Neck:     Musculoskeletal: Normal range of motion.  Cardiovascular:     Rate and Rhythm: Normal rate and regular rhythm.     Heart sounds: Normal heart sounds.  Pulmonary:     Effort: Pulmonary effort is normal.     Breath sounds: Normal breath sounds.  Abdominal:     General: Bowel sounds are normal.     Palpations: Abdomen is soft.  Lymphadenopathy:     Cervical: No cervical adenopathy.  Skin:    General: Skin is warm and dry.     Capillary Refill: Capillary refill takes less than 2 seconds.  Neurological:     Mental Status: She is alert and oriented to person, place, and time.  Psychiatric:        Behavior: Behavior normal.      Musculoskeletal Exam: C-spine, thoracic and lumbar spine good range of motion.  Shoulder joints, elbow joints, wrist joints, MCPs PIPs and DIPs with good range of motion  with no synovitis.  Hip joints, knee joints, ankles were in good range of motion with no synovitis. CDAI Exam: CDAI Score: 0.2  Patient Global: 1 mm; Provider Global: 1 mm Swollen: 0 ; Tender: 0  Joint Exam   No joint exam has been documented for this visit   There is currently no information documented on the homunculus. Go to the Rheumatology activity and complete the homunculus joint exam.  Investigation: No additional findings.  Imaging: [place] Limited Joint Space Structures Up Right  Result Date: 04/20/2019 Limited musculoskeletal ultrasound was performed and interpreted by Lyndal Pulley Limited ultrasound  shows the patient still has some abnormality noted on the dorsal aspect of the wrist. Seems to be under the extensor tendon. Hypoechoic changes and increasing Doppler flow still noted. Does not appear to be infectious etiology anymore. Potentially a resolving rheumatoid nodule.    Recent Labs: Lab Results  Component Value Date   WBC 10.8 (H) 03/16/2019   HGB 13.7 03/16/2019   PLT 327 03/16/2019   NA 138 04/28/2019   K 4.4 04/28/2019   CL 101 04/28/2019   CO2 29 04/28/2019   GLUCOSE 91 04/28/2019   BUN 14 04/28/2019   CREATININE 0.78 04/28/2019   BILITOT 0.7 04/28/2019   ALKPHOS 74 04/28/2019   AST 37 04/28/2019   ALT 42 (H) 04/28/2019   PROT 7.0 04/28/2019   ALBUMIN 4.6 04/28/2019   CALCIUM 10.3 04/28/2019   GFRAA >60 03/16/2019   QFTBGOLDPLUS NEGATIVE 10/23/2018    Speciality Comments: PLQ Eye Exam: 04/09/19 [facility] Follow up in 1 year.   Procedures:  No procedures performed Allergies: Gluten meal, Lortab [hydrocodone-acetaminophen], Morphine and related, Shellfish allergy, and Strawberry (diagnostic)   Assessment / Plan:     Visit Diagnoses: Rheumatoid arthritis of multiple sites with negative rheumatoid factor ([place]) - Diagnosed at [facility].  The ultrasound examination performed recently showed synovitis in her bilateral  hands.  Patient has been on Enbrel and Plaquenil combination now.  She states she has some discomfort prior to the next general injection.  She was doing better on methotrexate but methotrexate was discontinued due to elevated LFTs.  Her most recent labs show  improvement in her LFTs.  High risk medication use - Enbrel Mini 50 mg every 7 days and Plaquenil 200 mg twice daily.  Last TB gold negative on 10/23/2018 and will monitor yearly.  Most recent CBC within normal limits except elevated WBC count on 03/16/2019.  Most recent  CMP within normal limits except for elevated ALT but stable on 04/28/2019.  Will monitor CBC/CMP every 3 months and standing orders placed. She has received the flu vaccine in October, Pneumovax 23, and first dose of Shingrix.  Recommend annual flu, Prevnar 13, and second dose of Shingrix as indicated.  Recommend yearly skin exams due to increased risk of melanoma. - Plan: CBC with Differential/Platelet, COMPLETE METABOLIC PANEL WITH GFR  Primary osteoarthritis of both feet-currently not having much discomfort.  Elevated LFTs-she may have underlying fatty liver.  LFTs have improved since she has been off methotrexate.  Decreased cardiac ejection fraction-patient brought a report on her cell phone today where the ejection fraction was 41%.  She is supposed to discuss this further with her cardiologist.  She is concerned about the exercise she performs every day and if there will be any limits.  History of gastroesophageal reflux (GERD)  Hx of migraines  History of hypothyroidism  History of asthma  Orders: Orders Placed This Encounter  Procedures  . CBC with Differential/Platelet  . COMPLETE METABOLIC PANEL WITH GFR   No orders of the defined types were placed in this encounter.   Face-to-face time spent with patient was 25 minutes. Greater than 50% of time was spent in counseling and coordination of care.  Follow-Up Instructions: Return in about 5 months (around  10/08/2019) for Rheumatoid arthritis.   Bo Merino, MD  Note - This record has been created using Editor, commissioning.  Chart creation errors have been sought, but may not always  have been located. Such creation errors do not reflect  on  the standard of medical care.

## 2019-04-24 NOTE — Telephone Encounter (Signed)
Spoke to patient who has appt on 8/10 for hot flashes and is requesting labs for her thyroid.  Patient was told that appt would be a doxy visit but I explained to patient that we would need to see her in office to assess.  Advised that I would reach out to Dr. Rogers Blocker to see if okay to switch appt to in office visit.  She verbalized understanding.

## 2019-04-28 ENCOUNTER — Ambulatory Visit: Payer: BC Managed Care – PPO | Admitting: Family Medicine

## 2019-04-28 ENCOUNTER — Telehealth (HOSPITAL_COMMUNITY): Payer: Self-pay | Admitting: *Deleted

## 2019-04-28 ENCOUNTER — Other Ambulatory Visit: Payer: Self-pay | Admitting: Rheumatology

## 2019-04-28 ENCOUNTER — Encounter: Payer: Self-pay | Admitting: Family Medicine

## 2019-04-28 VITALS — BP 118/70 | HR 66 | Temp 97.8°F | Ht 67.0 in | Wt 167.8 lb

## 2019-04-28 DIAGNOSIS — R232 Flushing: Secondary | ICD-10-CM | POA: Diagnosis not present

## 2019-04-28 DIAGNOSIS — Z803 Family history of malignant neoplasm of breast: Secondary | ICD-10-CM | POA: Diagnosis not present

## 2019-04-28 DIAGNOSIS — M0609 Rheumatoid arthritis without rheumatoid factor, multiple sites: Secondary | ICD-10-CM

## 2019-04-28 DIAGNOSIS — I447 Left bundle-branch block, unspecified: Secondary | ICD-10-CM | POA: Insufficient documentation

## 2019-04-28 NOTE — Telephone Encounter (Signed)
Patient given detailed instructions per Myocardial Perfusion Study Information Sheet for the test on 04/30/19. Patient notified to arrive 15 minutes early and that it is imperative to arrive on time for appointment to keep from having the test rescheduled.  If you need to cancel or reschedule your appointment, please call the office within 24 hours of your appointment. . Patient verbalized understanding. [name] Jacqueline    

## 2019-04-28 NOTE — Telephone Encounter (Signed)
Last Visit: 02/06/19 Next Visit: 05/08/19 Labs: 03/16/19 ASt 60, ALT 46 Total bilirubin 1.3 WBC 10.8 Neutro Abs 7.9 TB Gold: 10/28/18 Neg   Okay to refill per Dr. Estanislado Pandy

## 2019-04-28 NOTE — Patient Instructions (Signed)
Doing lab work up for thyroid/menopause.. will go from there!  Good to see you!    Menopause Menopause is the normal time of life when menstrual periods stop completely. It is usually confirmed by 12 months without a menstrual period. The transition to menopause (perimenopause) most often happens between the ages of 64 and 1. During perimenopause, hormone levels change in your body, which can cause symptoms and affect your health. Menopause may increase your risk for:  Loss of bone (osteoporosis), which causes bone breaks (fractures).  Depression.  Hardening and narrowing of the arteries (atherosclerosis), which can cause heart attacks and strokes. What are the causes? This condition is usually caused by a natural change in hormone levels that happens as you get older. The condition may also be caused by surgery to remove both ovaries (bilateral oophorectomy). What increases the risk? This condition is more likely to start at an earlier age if you have certain medical conditions or treatments, including:  A tumor of the pituitary gland in the brain.  A disease that affects the ovaries and hormone production.  Radiation treatment for cancer.  Certain cancer treatments, such as chemotherapy or hormone (anti-estrogen) therapy.  Heavy smoking and excessive alcohol use.  Family history of early menopause. This condition is also more likely to develop earlier in women who are very thin. What are the signs or symptoms? Symptoms of this condition include:  Hot flashes.  Irregular menstrual periods.  Night sweats.  Changes in feelings about sex. This could be a decrease in sex drive or an increased comfort around your sexuality.  Vaginal dryness and thinning of the vaginal walls. This may cause painful intercourse.  Dryness of the skin and development of wrinkles.  Headaches.  Problems sleeping (insomnia).  Mood swings or irritability.  Memory problems.  Weight gain.   Hair growth on the face and chest.  Bladder infections or problems with urinating. How is this diagnosed? This condition is diagnosed based on your medical history, a physical exam, your age, your menstrual history, and your symptoms. Hormone tests may also be done. How is this treated? In some cases, no treatment is needed. You and your health care provider should make a decision together about whether treatment is necessary. Treatment will be based on your individual condition and preferences. Treatment for this condition focuses on managing symptoms. Treatment may include:  Menopausal hormone therapy (MHT).  Medicines to treat specific symptoms or complications.  Acupuncture.  Vitamin or herbal supplements. Before starting treatment, make sure to let your health care provider know if you have a personal or family history of:  Heart disease.  Breast cancer.  Blood clots.  Diabetes.  Osteoporosis. Follow these instructions at home: Lifestyle  Do not use any products that contain nicotine or tobacco, such as cigarettes and e-cigarettes. If you need help quitting, ask your health care provider.  Get at least 30 minutes of physical activity on 5 or more days each week.  Avoid alcoholic and caffeinated beverages, as well as spicy foods. This may help prevent hot flashes.  Get 7-8 hours of sleep each night.  If you have hot flashes, try: ? Dressing in layers. ? Avoiding things that may trigger hot flashes, such as spicy food, warm places, or stress. ? Taking slow, deep breaths when a hot flash starts. ? Keeping a fan in your home and office.  Find ways to manage stress, such as deep breathing, meditation, or journaling.  Consider going to group therapy with other  women who are having menopause symptoms. Ask your health care provider about recommended group therapy meetings. Eating and drinking  Eat a healthy, balanced diet that contains whole grains, lean protein, low-fat  dairy, and plenty of fruits and vegetables.  Your health care provider may recommend adding more soy to your diet. Foods that contain soy include tofu, tempeh, and soy milk.  Eat plenty of foods that contain calcium and vitamin D for bone health. Items that are rich in calcium include low-fat milk, yogurt, beans, almonds, sardines, broccoli, and kale. Medicines  Take over-the-counter and prescription medicines only as told by your health care provider.  Talk with your health care provider before starting any herbal supplements. If prescribed, take vitamins and supplements as told by your health care provider. These may include: ? Calcium. Women age 51 and older should get 1,200 mg (milligrams) of calcium every day. ? Vitamin D. Women need 600-800 International Units of vitamin D each day. ? Vitamins B12 and B6. Aim for 50 micrograms of B12 and 1.5 mg of B6 each day. General instructions  Keep track of your menstrual periods, including: ? When they occur. ? How heavy they are and how long they last. ? How much time passes between periods.  Keep track of your symptoms, noting when they start, how often you have them, and how long they last.  Use vaginal lubricants or moisturizers to help with vaginal dryness and improve comfort during sex.  Keep all follow-up visits as told by your health care provider. This is important. This includes any group therapy or counseling. Contact a health care provider if:  You are still having menstrual periods after age 36.  You have pain during sex.  You have not had a period for 12 months and you develop vaginal bleeding. Get help right away if:  You have: ? Severe depression. ? Excessive vaginal bleeding. ? Pain when you urinate. ? A fast or irregular heart beat (palpitations). ? Severe headaches. ? Abdomen (abdominal) pain or severe indigestion.  You fell and you think you have a broken bone.  You develop leg or chest pain.  You develop  vision problems.  You feel a lump in your breast. Summary  Menopause is the normal time of life when menstrual periods stop completely. It is usually confirmed by 12 months without a menstrual period.  The transition to menopause (perimenopause) most often happens between the ages of 63 and 53.  Symptoms can be managed through medicines, lifestyle changes, and complementary therapies such as acupuncture.  Eat a balanced diet that is rich in nutrients to promote bone health and heart health and to manage symptoms during menopause. This information is not intended to replace advice given to you by your health care provider. Make sure you discuss any questions you have with your health care provider. Document Released: 11/25/2003 Document Revised: 08/17/2017 Document Reviewed: 10/07/2016 Elsevier Patient Education  2020 [employer].

## 2019-04-28 NOTE — Progress Notes (Signed)
Patient: [name] MRN: [id number] DOB: 04-19-1966 PCP: Orma Flaming, MD     Subjective:  Chief Complaint  Patient presents with  . Hot Flashes    HPI: The patient is a 53 y.o. female who presents today for hot flashes. Hot flashes started about 1.5 months ago. They started her on enbrel and was told by her endocrinologist that it's not due to this medication. She is unsure if she in menopausal as she had a hysterectomy. She wants to make sure it's not her thyroid. She states they are getting worse. She is hot right now. She sweats (dripping) at night and has more insomnia than she has in her entire life. She doesn't have palpitations, but her heart rate was slow. Denies any diarrhea, stomach pain, nausea, wheezing, shortness of breath.  LBBB currently being worked up by cardiology. Scheduled for stress test and normal holter.   Review of Systems  Constitutional: Negative for chills, fatigue and fever.  HENT: Negative.   Eyes: Negative for visual disturbance.  Respiratory: Negative for shortness of breath.   Cardiovascular: Negative for chest pain.  Gastrointestinal: Negative for abdominal pain and nausea.  Endocrine: Positive for heat intolerance.  Genitourinary: Negative.   Musculoskeletal: Negative for back pain, myalgias and neck pain.  Skin: Negative.   Neurological: Positive for dizziness and headaches.  Psychiatric/Behavioral: Positive for sleep disturbance. The patient is not nervous/anxious.     Allergies Patient is allergic to gluten meal; lortab [hydrocodone-acetaminophen]; morphine and related; shellfish allergy; and strawberry (diagnostic).  Past Medical History Patient  has a past medical history of Arthritis, Asthma, Frequent headaches, GERD (gastroesophageal reflux disease), H/O seasonal allergies, History of chicken pox, Migraines, and Thyroid disease.  Surgical History Patient  has a past surgical history that includes Cholecystectomy (1995); Laparoscopic  hysterectomy (2006); Thyroid surgery; Cardiac catheterization; Hammer toe surgery (2009); and Hernia repair (2017).  Family History Pateint's family history includes Alcohol abuse in her brother and paternal grandmother; Arthritis in her mother; Asthma in her brother, daughter, son, and son; Birth defects in her brother; COPD in her sister; Cancer in her brother, mother, paternal grandfather, and paternal grandmother; Diabetes in her father; Early death in her brother; Heart attack in her father, maternal grandmother, and paternal grandfather; Heart disease in her father and maternal grandmother; Hyperlipidemia in her brother, father, and maternal grandmother; Hypertension in her father; Mental illness in her brother; Miscarriages / Stillbirths in her mother; Peripheral Artery Disease in her sister; Stroke in her father and maternal grandmother.  Social History Patient  reports that she has never smoked. She has never used smokeless tobacco. She reports current alcohol use. She reports that she does not use drugs.    Objective: Vitals:   04/28/19 1527  BP: 118/70  Pulse: 66  Temp: 97.8 F (36.6 C)  TempSrc: Oral  SpO2: 99%  Weight: 167 lb 12.8 oz (76.1 kg)  Height: 5' 7"  (1.702 m)    Body mass index is 26.28 kg/m.  Physical Exam Vitals signs reviewed.  Constitutional:      Appearance: Normal appearance. She is normal weight.  HENT:     Head: Normocephalic and atraumatic.  Eyes:     Extraocular Movements: Extraocular movements intact.     Pupils: Pupils are equal, round, and reactive to light.  Neck:     Comments: No enlarged thyroid  Cardiovascular:     Rate and Rhythm: Normal rate and regular rhythm.     Heart sounds: Normal heart sounds.  Pulmonary:  Effort: Pulmonary effort is normal.     Breath sounds: Normal breath sounds.  Abdominal:     General: Abdomen is flat. Bowel sounds are normal.     Palpations: Abdomen is soft.  Skin:    General: Skin is warm and dry.      Capillary Refill: Capillary refill takes less than 2 seconds.  Neurological:     General: No focal deficit present.     Mental Status: She is alert and oriented to person, place, and time.        Assessment/plan: 1. Hot flashes Lab work up, but sounds menopausal vs. Possible thyroid. Discussed treatment options for hot flashes due to menopause. Does not want hormones as mom had breast cancer. Could do trial of gabapentin vs. Effexor. Will get labs back and then go from there.  - Comprehensive metabolic panel - TSH - T4, free - FSH/LH  2. Family history of breast cancer Will refer to genetics for BRCA testing.  - Ambulatory referral to Genetics  Return if symptoms worsen or fail to improve.   Orma Flaming, MD [place]   04/28/2019

## 2019-04-29 LAB — COMPREHENSIVE METABOLIC PANEL
ALT: 42 U/L — ABNORMAL HIGH (ref 0–35)
AST: 37 U/L (ref 0–37)
Albumin: 4.6 g/dL (ref 3.5–5.2)
Alkaline Phosphatase: 74 U/L (ref 39–117)
BUN: 14 mg/dL (ref 6–23)
CO2: 29 mEq/L (ref 19–32)
Calcium: 10.3 mg/dL (ref 8.4–10.5)
Chloride: 101 mEq/L (ref 96–112)
Creatinine, Ser: 0.78 mg/dL (ref 0.40–1.20)
GFR: 77.86 mL/min (ref >60.00)
Glucose, Bld: 91 mg/dL (ref 70–99)
Potassium: 4.4 mEq/L (ref 3.5–5.1)
Sodium: 138 mEq/L (ref 135–145)
Total Bilirubin: 0.7 mg/dL (ref 0.2–1.2)
Total Protein: 7 g/dL (ref 6.0–8.3)

## 2019-04-29 LAB — T4, FREE: Free T4: 1.41 ng/dL (ref 0.60–1.60)

## 2019-04-29 LAB — FSH/LH
FSH: 112.7 m[IU]/mL
LH: 51 m[IU]/mL

## 2019-04-29 LAB — TSH: TSH: 0.95 u[IU]/mL (ref 0.35–4.50)

## 2019-04-30 ENCOUNTER — Ambulatory Visit (HOSPITAL_COMMUNITY): Payer: BC Managed Care – PPO | Attending: Cardiovascular Disease

## 2019-04-30 ENCOUNTER — Encounter: Payer: Self-pay | Admitting: Family Medicine

## 2019-04-30 ENCOUNTER — Other Ambulatory Visit: Payer: Self-pay | Admitting: Family Medicine

## 2019-04-30 ENCOUNTER — Other Ambulatory Visit: Payer: Self-pay

## 2019-04-30 DIAGNOSIS — R9431 Abnormal electrocardiogram [ECG] [EKG]: Secondary | ICD-10-CM | POA: Diagnosis not present

## 2019-04-30 DIAGNOSIS — I447 Left bundle-branch block, unspecified: Secondary | ICD-10-CM

## 2019-04-30 LAB — MYOCARDIAL PERFUSION IMAGING
LV dias vol: 133 mL (ref 46–106)
LV sys vol: 79 mL
Peak HR: 103 {beats}/min
Rest HR: 70 {beats}/min
SDS: 1
SRS: 4
SSS: 5
TID: 1.05

## 2019-04-30 MED ORDER — GABAPENTIN 300 MG PO CAPS: 300.0000 mg | ORAL_CAPSULE | Freq: Every day | ORAL | 0 refills | Status: DC

## 2019-04-30 MED ORDER — TECHNETIUM TC 99M TETROFOSMIN IV KIT
32.8000 | PACK | Freq: Once | INTRAVENOUS | Status: AC | PRN
  Administered 2019-04-30: 32.8 via INTRAVENOUS
  Filled 2019-04-30: qty 33

## 2019-04-30 MED ORDER — REGADENOSON 0.4 MG/5ML IV SOLN
0.4000 mg | Freq: Once | INTRAVENOUS | Status: AC
  Administered 2019-04-30: 09:00:00 0.4 mg via INTRAVENOUS

## 2019-04-30 MED ORDER — TECHNETIUM TC 99M TETROFOSMIN IV KIT
10.1000 | PACK | Freq: Once | INTRAVENOUS | Status: AC | PRN
  Administered 2019-04-30: 08:00:00 10.1 via INTRAVENOUS
  Filled 2019-04-30: qty 11

## 2019-05-08 ENCOUNTER — Encounter: Payer: Self-pay | Admitting: Rheumatology

## 2019-05-08 ENCOUNTER — Ambulatory Visit: Payer: BLUE CROSS/BLUE SHIELD | Admitting: Rheumatology

## 2019-05-08 ENCOUNTER — Other Ambulatory Visit: Payer: Self-pay

## 2019-05-08 VITALS — BP 123/77 | HR 83 | Resp 12 | Ht 67.0 in | Wt 168.0 lb

## 2019-05-08 DIAGNOSIS — M19072 Primary osteoarthritis, left ankle and foot: Secondary | ICD-10-CM

## 2019-05-08 DIAGNOSIS — R945 Abnormal results of liver function studies: Secondary | ICD-10-CM | POA: Diagnosis not present

## 2019-05-08 DIAGNOSIS — Z8709 Personal history of other diseases of the respiratory system: Secondary | ICD-10-CM

## 2019-05-08 DIAGNOSIS — Z8719 Personal history of other diseases of the digestive system: Secondary | ICD-10-CM

## 2019-05-08 DIAGNOSIS — M0609 Rheumatoid arthritis without rheumatoid factor, multiple sites: Secondary | ICD-10-CM | POA: Diagnosis not present

## 2019-05-08 DIAGNOSIS — R7989 Other specified abnormal findings of blood chemistry: Secondary | ICD-10-CM

## 2019-05-08 DIAGNOSIS — Z79899 Other long term (current) drug therapy: Secondary | ICD-10-CM

## 2019-05-08 DIAGNOSIS — R931 Abnormal findings on diagnostic imaging of heart and coronary circulation: Secondary | ICD-10-CM

## 2019-05-08 DIAGNOSIS — M19071 Primary osteoarthritis, right ankle and foot: Secondary | ICD-10-CM

## 2019-05-08 DIAGNOSIS — Z8669 Personal history of other diseases of the nervous system and sense organs: Secondary | ICD-10-CM

## 2019-05-08 DIAGNOSIS — Z8639 Personal history of other endocrine, nutritional and metabolic disease: Secondary | ICD-10-CM

## 2019-05-08 NOTE — Telephone Encounter (Signed)
Follow up:     Patient calling back from a few days ago and have not heard from anyone. Patient would like for some one to call her back.

## 2019-05-08 NOTE — Patient Instructions (Signed)
Standing Labs We placed an order today for your standing lab work.    Please come back and get your standing labs in November and every 3 months   We have open lab daily Monday through Thursday from 8:30-12:30 PM and 1:30-4:30 PM and Friday from 8:30-12:30 PM and 1:30 -4:00 PM at the office of Dr. Bettie Swavely.   You may experience shorter wait times on Monday and Friday afternoons. The office is located at [address], [address], [place], [place] [address] No appointment is necessary.   Labs are drawn by [facility].  You may receive a bill from [facility] for your lab work.  If you wish to have your labs drawn at another location, please call the office 24 hours in advance to send orders.  If you have any questions regarding directions or hours of operation,  please call [phone].   Just as a reminder please drink plenty of water prior to coming for your lab work. Thanks 

## 2019-05-09 ENCOUNTER — Telehealth: Payer: Self-pay | Admitting: Internal Medicine

## 2019-05-09 NOTE — Telephone Encounter (Signed)
Reviewed results/review of nuclear study with patient. She wanted to know if she could continue to exercise like normal.  Adv that she does not need to restrict based on nuc study and that more information would be obtained with echo. She has been scheduled for echo on 05/12/19.       COVID-19 Pre-Screening Questions:  . In the past 7 to 10 days have you had a cough,  shortness of breath, headache, congestion, fever (100 or greater) body aches, chills, sore throat, or sudden loss of taste or sense of smell?  NO . Have you been around anyone with known Covid 19.  NO . Have you been around anyone who is awaiting Covid 19 test results in the past 7 to 10 days?  NO . Have you been around anyone who has been exposed to Covid 19, or has mentioned symptoms of Covid 19 within the past 7 to 10 days?  NO  If you have any concerns/questions about symptoms patients report during screening (either on the phone or at threshold). Contact the provider seeing the patient or DOD for further guidance.  If neither are available contact a member of the leadership team.

## 2019-05-09 NOTE — Telephone Encounter (Signed)
New message    Patient calling to discuss stress test results. Patient wants to know if she had prior heart attack. Patient requesting echo

## 2019-05-09 NOTE — Telephone Encounter (Signed)
Called patient and reviewed results/review of nuclear study by Dr. Harrington Challenger. Echo has been scheduled for 05/12/19.  Prescreening questions completed.   Adv that pt does not need to change her exercise habits based on the nuclear study.

## 2019-05-12 ENCOUNTER — Other Ambulatory Visit: Payer: Self-pay

## 2019-05-12 ENCOUNTER — Other Ambulatory Visit (HOSPITAL_COMMUNITY): Payer: Self-pay | Admitting: Internal Medicine

## 2019-05-12 ENCOUNTER — Ambulatory Visit (HOSPITAL_COMMUNITY): Payer: BC Managed Care – PPO | Attending: Internal Medicine

## 2019-05-12 DIAGNOSIS — R9439 Abnormal result of other cardiovascular function study: Secondary | ICD-10-CM | POA: Diagnosis not present

## 2019-05-12 MED ORDER — PERFLUTREN LIPID MICROSPHERE
1.0000 mL | INTRAVENOUS | Status: AC | PRN
  Administered 2019-05-12: 2 mL via INTRAVENOUS

## 2019-05-14 ENCOUNTER — Telehealth: Payer: Self-pay | Admitting: *Deleted

## 2019-05-14 DIAGNOSIS — I447 Left bundle-branch block, unspecified: Secondary | ICD-10-CM

## 2019-05-14 DIAGNOSIS — R072 Precordial pain: Secondary | ICD-10-CM

## 2019-05-14 DIAGNOSIS — R42 Dizziness and giddiness: Secondary | ICD-10-CM

## 2019-05-14 DIAGNOSIS — R079 Chest pain, unspecified: Secondary | ICD-10-CM

## 2019-05-14 NOTE — Telephone Encounter (Addendum)
Orders for cardiac CT placed __________________________________________   ----- Message from Fay Records, MD sent at 05/13/2019  3:46 PM EDT ----- Reviewed echo findings with pt Interventricular septum has bounce which may be just due to conduction delay   There is an area of hypokinesis in mid/distal septum   Given symptoms to confrim coronary anatomy is OK would recomm CT coronary angiogram to define / r/o vascular abnormalities

## 2019-05-16 NOTE — Telephone Encounter (Deleted)
Your cardiac CT will be scheduled at one of the below locations:   [facility] [address] [place], [place] [address] [phone]) [place] [address] [place], [place] [address] [phone]  Please arrive at the [facility] main entrance of [facility] 30-45 minutes prior to test start time. Proceed to the [facility] Radiology Department (first floor) to check-in and test prep.  Please follow these instructions carefully (unless otherwise directed):  Hold all erectile dysfunction medications at least 48 hours prior to test.  On the Night Before the Test: . Be sure to Drink plenty of water. . Do not consume any caffeinated/decaffeinated beverages or chocolate 12 hours prior to your test. . Do not take any antihistamines 12 hours prior to your test. . If you take Metformin do not take 24 hours prior to test. . If the patient has contrast allergy: ? Patient will need a prescription for Prednisone and very clear instructions (as follows): 1. Prednisone 50 mg - take 13 hours prior to test 2. Take another Prednisone 50 mg 7 hours prior to test 3. Take another Prednisone 50 mg 1 hour prior to test 4. Take Benadryl 50 mg 1 hour prior to test . Patient must complete all four doses of above prophylactic medications. . Patient will need a ride after test due to Benadryl.  On the Day of the Test: . Drink plenty of water. Do not drink any water within one hour of the test. . Do not eat any food 4 hours prior to the test. . You may take your regular medications prior to the test.  . Take metoprolol (Lopressor) two hours prior to test. . HOLD Furosemide/Hydrochlorothiazide morning of the test. . FEMALES- please wear underwire-free bra if available   *For Clinical Staff only. Please instruct patient the following:*        -Drink plenty of water       -Hold Furosemide/hydrochlorothiazide morning of the  test       -Take metoprolol (Lopressor) 2 hours prior to test (if applicable).                  -If HR is less than 55 BPM- No Beta Blocker                -IF HR is greater than 55 BPM and patient is less than or equal to 10 yrs old Lopressor 100mg  x1.                -If HR is greater than 55 BPM and patient is greater than 73 yrs old Lopressor 50 mg x1.     Do not give Lopressor to patients with an allergy to lopressor or anyone with asthma or active COPD symptoms (currently taking steroids).       After the Test: . Drink plenty of water. . After receiving IV contrast, you may experience a mild flushed feeling. This is normal. . On occasion, you may experience a mild rash up to 24 hours after the test. This is not dangerous. If this occurs, you can take Benadryl 25 mg and increase your fluid intake. . If you experience trouble breathing, this can be serious. If it is severe call 911 IMMEDIATELY. If it is mild, please call our office. . If you take any of these medications: Glipizide/Metformin, Avandament, Glucavance, please do not take 48 hours after completing test.    Please contact the  cardiac imaging nurse navigator should you have any questions/concerns Marchia Bond, RN Navigator Cardiac Imaging [facility] V[facility] Services [phone] Office  [phone] Cell

## 2019-05-16 NOTE — Telephone Encounter (Signed)
Spoke with patient.  I will send cardiac ct instructions to her via MyChart. Sent message to S. Juleen China to confirm pt does not need pre medication for shellfish allergy and message to Dr. Harrington Challenger to confirm beta blocker use per ct protocol.

## 2019-05-19 MED ORDER — METOPROLOL TARTRATE 100 MG PO TABS: ORAL_TABLET | ORAL | 0 refills | Status: DC

## 2019-05-19 NOTE — Telephone Encounter (Signed)
Reviewed with S. Juleen China and confirmed pt does not need premedicated for shellfish allergy.  Sent message to confirm with Dr. Harrington Challenger ok to use metoprolol since pt has asthma listed in her past medical history.

## 2019-05-21 ENCOUNTER — Telehealth (HOSPITAL_COMMUNITY): Payer: Self-pay | Admitting: Emergency Medicine

## 2019-05-21 NOTE — Telephone Encounter (Signed)
Left message on voicemail with name and callback number Darik Massing RN Navigator Cardiac Imaging [place] [facility] [phone] Office [phone] Cell  

## 2019-05-21 NOTE — Telephone Encounter (Signed)
Reviewed with Dr. Acie Fredrickson.  Ok for pt to take one dose of metoprolol for CT scan.  She is not having any respiratory issues related to her asthma.  Pt sent MyChart message asking about this.  I have replied to let her know okay to take.

## 2019-05-22 ENCOUNTER — Other Ambulatory Visit: Payer: Self-pay

## 2019-05-22 ENCOUNTER — Ambulatory Visit: Payer: Self-pay | Admitting: Family Medicine

## 2019-05-22 ENCOUNTER — Ambulatory Visit (HOSPITAL_COMMUNITY)
Admission: RE | Admit: 2019-05-22 | Discharge: 2019-05-22 | Disposition: A | Discharge status: Home / Self Care | Payer: BC Managed Care – PPO | Source: Ambulatory Visit | Attending: Internal Medicine | Admitting: Internal Medicine

## 2019-05-22 ENCOUNTER — Ambulatory Visit (HOSPITAL_COMMUNITY): Payer: BC Managed Care – PPO

## 2019-05-22 DIAGNOSIS — Z006 Encounter for examination for normal comparison and control in clinical research program: Secondary | ICD-10-CM

## 2019-05-22 DIAGNOSIS — R0789 Other chest pain: Secondary | ICD-10-CM | POA: Diagnosis not present

## 2019-05-22 DIAGNOSIS — R072 Precordial pain: Secondary | ICD-10-CM | POA: Insufficient documentation

## 2019-05-22 DIAGNOSIS — I447 Left bundle-branch block, unspecified: Secondary | ICD-10-CM | POA: Insufficient documentation

## 2019-05-22 DIAGNOSIS — R42 Dizziness and giddiness: Secondary | ICD-10-CM | POA: Diagnosis not present

## 2019-05-22 DIAGNOSIS — R079 Chest pain, unspecified: Secondary | ICD-10-CM

## 2019-05-22 MED ORDER — NITROGLYCERIN 0.4 MG SL SUBL
SUBLINGUAL_TABLET | SUBLINGUAL | Status: AC
  Administered 2019-05-22: 16:00:00 0.8 mg via SUBLINGUAL
  Filled 2019-05-22: qty 2

## 2019-05-22 MED ORDER — IOHEXOL 350 MG/ML SOLN
80.0000 mL | Freq: Once | INTRAVENOUS | Status: AC | PRN
  Administered 2019-05-22: 80 mL via INTRAVENOUS

## 2019-05-22 MED ORDER — NITROGLYCERIN 0.4 MG SL SUBL
0.8000 mg | SUBLINGUAL_TABLET | Freq: Once | SUBLINGUAL | Status: AC
  Administered 2019-05-22: 16:00:00 0.8 mg via SUBLINGUAL
  Filled 2019-05-22: qty 25

## 2019-05-22 NOTE — Research (Signed)
Cadfem Informed Consent    Patient Name:  [name]    Subject met inclusion and exclusion criteria.  The informed consent form, study requirements and expectations were reviewed with the subject and questions and concerns were addressed prior to the signing of the consent form.  The subject verbalized understanding of the trail requirements.  The subject agreed to participate in the CADFEM trial and signed the informed consent.  The informed consent was obtained prior to performance of any protocol-specific procedures for the subject.  A copy of the signed informed consent was given to the subject and a copy was placed in the subject's medical record.   Neva Seat

## 2019-05-28 ENCOUNTER — Telehealth: Payer: Self-pay

## 2019-05-28 DIAGNOSIS — E785 Hyperlipidemia, unspecified: Secondary | ICD-10-CM

## 2019-05-28 DIAGNOSIS — Z79899 Other long term (current) drug therapy: Secondary | ICD-10-CM

## 2019-05-28 MED ORDER — ROSUVASTATIN CALCIUM 5 MG PO TABS: 2.5000 mg | ORAL_TABLET | Freq: Every day | ORAL | 3 refills | Status: DC

## 2019-05-28 NOTE — Telephone Encounter (Signed)
Notes recorded by Frederik Schmidt, RN on 05/28/2019 at 8:25 AM EDT  The patient has been notified of the result and verbalized understanding. All questions (if any) were answered.  Frederik Schmidt, RN 05/28/2019 8:25 AM

## 2019-05-28 NOTE — Telephone Encounter (Signed)
-----   Message from Fay Records, MD sent at 05/23/2019  4:45 PM EDT ----- Contacted patient  Very minimal plaquing but it is there  Would reocmm low dose Crestor 2.5 mg   F/U lipids in 8 wks with AST  I would like to see pt in f/u next summer (early June), sooner if dizziness worsens     F/U of LBBB and symptoms

## 2019-05-30 ENCOUNTER — Other Ambulatory Visit: Payer: Self-pay | Admitting: Family Medicine

## 2019-06-14 ENCOUNTER — Other Ambulatory Visit: Payer: Self-pay | Admitting: Family Medicine

## 2019-06-18 ENCOUNTER — Other Ambulatory Visit: Payer: Self-pay

## 2019-06-18 ENCOUNTER — Ambulatory Visit (INDEPENDENT_AMBULATORY_CARE_PROVIDER_SITE_OTHER): Payer: BC Managed Care – PPO

## 2019-06-18 DIAGNOSIS — Z23 Encounter for immunization: Secondary | ICD-10-CM | POA: Diagnosis not present

## 2019-06-23 ENCOUNTER — Encounter: Payer: Self-pay | Admitting: Rheumatology

## 2019-06-23 NOTE — Progress Notes (Signed)
Office Visit Note  Patient: [name]             Date of Birth: 1966-06-08           MRN: [id number]             PCP: Orma Flaming, MD Referring: Orma Flaming, MD Visit Date: 06/24/2019 Occupation: @GUAROCC @  Subjective:  Pain in multiple joints   History of Present Illness: Charnel Wolanin is a 53 y.o. female with history of seronegative rheumatoid arthritis and osteoarthritis.  She is on Plaquenil 200 mg 1 tablet BID and enbrel 50 mg sq once weekly.  She has not missed any doses of Plaquenil or Enbrel.  She started on Enbrel in May 2020.  She has been having a flare for the past 4 days.  She states she usually injects Enbrel on Thursdays and by Tuesday she starts having increased pain and stiffness.  She presents today with pain in bilateral hands, bilateral wrist joints, the right elbow, and bilateral ankle joints.  She states she is having swelling in the left ankle joint.  She states that her joint stiffness is lasting all day.   Activities of Daily Living:  Patient reports morning stiffness for 24 hours.   Patient Reports nocturnal pain.  Difficulty dressing/grooming: Denies Difficulty climbing stairs: Denies Difficulty getting out of chair: Denies Difficulty using hands for taps, buttons, cutlery, and/or writing: Reports  Review of Systems  Constitutional: Negative for fatigue.  HENT: Positive for mouth dryness. Negative for mouth sores and nose dryness.   Eyes: Positive for dryness. Negative for pain, itching and visual disturbance.  Respiratory: Negative for cough, hemoptysis, shortness of breath and difficulty breathing.   Cardiovascular: Negative for chest pain, palpitations, hypertension and swelling in legs/feet.  Gastrointestinal: Negative for abdominal pain, blood in stool, constipation and diarrhea.  Endocrine: Negative for increased urination.  Genitourinary: Negative for difficulty urinating and painful urination.  Musculoskeletal: Positive for arthralgias,  joint pain, joint swelling and morning stiffness. Negative for myalgias, muscle weakness, muscle tenderness and myalgias.  Skin: Negative for color change, pallor, rash, hair loss, nodules/bumps, skin tightness, ulcers and sensitivity to sunlight.  Allergic/Immunologic: Negative for susceptible to infections.  Neurological: Positive for headaches. Negative for dizziness, light-headedness, numbness, memory loss and weakness.  Hematological: Negative for swollen glands.  Psychiatric/Behavioral: Positive for sleep disturbance. Negative for depressed mood and confusion. The patient is not nervous/anxious.     PMFS History:  Patient Active Problem List   Diagnosis Date Noted  . Left bundle branch block 04/28/2019  . Right wrist pain 03/13/2019  . Hamstring strain, left, initial encounter 02/20/2019  . 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 Primary osteoarthritis of both feet 10/09/2018  . Hx of migraines 09/27/2018  . History of gastroesophageal reflux (GERD) 09/27/2018  . 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 Moderate persistent asthma 07/05/2018  . Hypothyroid 06/28/2018    Past Medical History:  Diagnosis Date  . Arthritis   . Asthma   . Frequent headaches   . GERD (gastroesophageal reflux disease)   . H/O seasonal allergies   . History of chicken pox   . Migraines   . Thyroid disease     Family History  Problem Relation Age of Onset  . Arthritis Mother   . Cancer Mother        breast ca  . Miscarriages / [place] Mother   . Diabetes Father   . Heart attack Father   . Heart disease Father   .  Hyperlipidemia Father   . Hypertension Father   . Stroke Father        x3  . COPD Sister   . Peripheral Artery Disease Sister   . Asthma Brother   . Birth defects Brother   . Cancer Brother        skin  . Hyperlipidemia Brother   . Heart attack Maternal Grandmother   . Heart disease Maternal Grandmother   . Hyperlipidemia Maternal Grandmother   . Stroke  Maternal Grandmother   . Alcohol abuse Paternal Grandmother   . Cancer Paternal Grandmother   . Cancer Paternal Grandfather        breast  . Heart attack Paternal Grandfather   . Alcohol abuse Brother   . Early death Brother        suicide  . Mental illness Brother   . Asthma Daughter   . Asthma Son   . Asthma Son    Past Surgical History:  Procedure Laterality Date  . CARDIAC CATHETERIZATION    . CHOLECYSTECTOMY  1995  . [place]  2009  . HERNIA REPAIR  2017  . LAPAROSCOPIC HYSTERECTOMY  2006  . THYROID SURGERY     Social History   Social History Narrative  . Not on file   Immunization History  Administered Date(s) Administered  . Influenza,inj,Quad PF,6+ Mos 06/28/2018, 06/18/2019  . Influenza-Unspecified 01/25/2018  . Pneumococcal Polysaccharide-23 10/22/2018  . Tdap 06/28/2018  . Zoster Recombinat (Shingrix) 04/15/2019     Objective: Vital Signs: BP 133/78 (BP Location: Left Arm, Patient Position: Sitting, Cuff Size: Normal)   Pulse 72   Resp 14   Ht 5' 7.5" (1.715 m)   Wt 172 lb 6.4 oz (78.2 kg)   LMP  (LMP Unknown)   BMI 26.60 kg/m    Physical Exam Vitals signs and nursing note reviewed.  Constitutional:      Appearance: She is well-developed.  HENT:     Head: Normocephalic and atraumatic.  Eyes:     Conjunctiva/sclera: Conjunctivae normal.  Neck:     Musculoskeletal: Normal range of motion.  Cardiovascular:     Rate and Rhythm: Normal rate and regular rhythm.     Heart sounds: Normal heart sounds.  Pulmonary:     Effort: Pulmonary effort is normal.     Breath sounds: Normal breath sounds.  Abdominal:     General: Bowel sounds are normal.     Palpations: Abdomen is soft.  Lymphadenopathy:     Cervical: No cervical adenopathy.  Skin:    General: Skin is warm and dry.     Capillary Refill: Capillary refill takes less than 2 seconds.  Neurological:     Mental Status: She is alert and oriented to person, place, and time.   Psychiatric:        Behavior: Behavior normal.      Musculoskeletal Exam: C-spine, thoracic spine, and lumbar spine good ROM. Tenderness and discomfort in the sacrum. Shoulder joints, elbow joints, wrist joints, MCPs, PIPs, and DIPs good ROM.  Tenderness of the right 2nd PIP joint. Synovtiis of left 2nd MCP joint.  Hip joints discomfort with ROM.  Knee joints good ROM with no discomfort.  No warmth or effusion of knee joints. Warmth in bilateral ankle joints.    CDAI Exam: CDAI Score: 5.4  Patient Global: 7 mm; Provider Global: 7 mm Swollen: 3 ; Tender: 5  Joint Exam      Right  Left  Elbow   Tender  MCP 2     Swollen Tender  PIP 2   Tender     Ankle  Swollen Tender  Swollen Tender     Investigation: No additional findings.  Imaging: No results found.  Recent Labs: Lab Results  Component Value Date   WBC 10.8 (H) 03/16/2019   HGB 13.7 03/16/2019   PLT 327 03/16/2019   NA 138 04/28/2019   K 4.4 04/28/2019   CL 101 04/28/2019   CO2 29 04/28/2019   GLUCOSE 91 04/28/2019   BUN 14 04/28/2019   CREATININE 0.78 04/28/2019   BILITOT 0.7 04/28/2019   ALKPHOS 74 04/28/2019   AST 37 04/28/2019   ALT 42 (H) 04/28/2019   PROT 7.0 04/28/2019   ALBUMIN 4.6 04/28/2019   CALCIUM 10.3 04/28/2019   GFRAA >60 03/16/2019   QFTBGOLDPLUS NEGATIVE 10/23/2018    Speciality Comments: PLQ Eye Exam: 04/09/19 [facility] Follow up in 1 year.   Procedures:  No procedures performed Allergies: Shellfish allergy, Gluten meal, Lortab [hydrocodone-acetaminophen], Morphine and related, and Strawberry (diagnostic)   Assessment / Plan:     Visit Diagnoses: Rheumatoid arthritis of multiple sites with negative rheumatoid factor ([place]) - Diagnosed at [facility]. The ultrasound examination performed showed synovitis in bilateral hands: She has synovitis of multiple joints as described above.  She has been having a flare for the past 4 days.  She typically injects  Enbrel every Thursday and by Tuesday she has been having increased joint pain and stiffness for the past several weeks.  Her joint stiffness has been lasting all day.  She was started on Enbrel 50 mg subcutaneous injections in May 2020 and continues to take Plaquenil 200 mg 1 tablet twice daily.  We discussed switching her from Enbrel to Humira.  Indications, contraindications, potential side effects of Humira were discussed today.  All questions were addressed and consent was obtained.  She will continue taking Plaquenil as prescribed.  A short low-dose prednisone taper was sent to the pharmacy today.  Once Humira has been approved she will return to the office for the administration of the first injection.  She will follow-up 1 month after starting on Humira.  Counseled patient that Humira is a TNF blocking agent.  Counseled patient on purpose, proper use, and adverse effects of Humira.  Reviewed the most common adverse effects including infections, headache, and injection site reactions. Discussed that there is the possibility of an increased risk of malignancy but it is not well understood if this increased risk is due to the medication or the disease state.  Advised patient to get yearly dermatology exams due to risk of skin cancer. Counseled patient that Humira should be held prior to scheduled surgery.  Counseled patient to avoid live vaccines while on Humira.  Advised patient to get annual influenza vaccine and the pneumococcal vaccine as indicated.    Reviewed the importance of regular labs while on Humira therapy.  Standing orders placed.  Provided patient with medication education material and answered all questions.  Patient consented to Humira.  Will upload consent into the media tab.  Reviewed storage instructions of Humira.  Advised initial injection must be administered in office.  Patient verbalized understanding.  Dose will be for rheumatoid arthritis Humira 40 mg every 14 days.  Prescription  pending lab results and/or insurance approval.  High risk medication use - Enbrel Mini 50 mg every 7 days and Plaquenil 200 mg 1 tablet by mouth twice daily.she started on  Enbrel in May 2020.  She is having inadequate response to Enbrel and will be switched to Humira.  Eye Exam: 04/09/19.   Primary osteoarthritis of both feet: She has tenderness and warmth of bilateral ankle joints.  She has good range of motion with some discomfort bilaterally.  She has no discomfort in her toes at this time.  She wears proper fitting shoes.  Elevated LFTs - She may have underlying fatty liver.  LFTs have improved since she has been off methotrexate.  ALT was 42 and AST was 37 on 04/28/2019.  Other medical conditions are listed as follows:  Decreased cardiac ejection fraction  History of asthma  History of gastroesophageal reflux (GERD)  History of hypothyroidism  Hx of migraines  Orders: No orders of the defined types were placed in this encounter.  Meds ordered this encounter  Medications  . predniSONE (DELTASONE) 5 MG tablet    Sig: Take 4 tablets by mouth daily x2 days, 3 tablets by mouth daily x2 days, 2 tablets by mouth daily x2 days, 1 tablet by mouth daily x2 days.    Dispense:  20 tablet    Refill:  0    Face-to-face time spent with patient was 30 minutes. Greater than 50% of time was spent in counseling and coordination of care.  Follow-Up Instructions: Return in about 5 weeks (around 07/29/2019) for Rheumatoid arthritis, Osteoarthritis.   Ofilia Neas, PA-C   I examined and evaluated the patient with Hazel Sams PA.  Patient was having a flare with synovitis in multiple joints as described above.  We had detailed discussion regarding different treatment options.  After discussing indications side effects contraindications we decided to switch from Enbrel to Humira.  Informed consent was taken.  Once approved we will switch her from Enbrel to Humira.  The plan of care was discussed as  noted above.  Bo Merino, MD  Note - This record has been created using Editor, commissioning.  Chart creation errors have been sought, but may not always  have been located. Such creation errors do not reflect on  the standard of medical care.

## 2019-06-24 ENCOUNTER — Encounter: Payer: Self-pay | Admitting: Physician Assistant

## 2019-06-24 ENCOUNTER — Other Ambulatory Visit: Payer: Self-pay

## 2019-06-24 ENCOUNTER — Telehealth: Payer: Self-pay | Admitting: Pharmacist

## 2019-06-24 ENCOUNTER — Other Ambulatory Visit: Payer: Self-pay | Admitting: Family Medicine

## 2019-06-24 ENCOUNTER — Ambulatory Visit: Payer: BC Managed Care – PPO | Admitting: Physician Assistant

## 2019-06-24 VITALS — BP 133/78 | HR 72 | Resp 14 | Ht 67.5 in | Wt 172.4 lb

## 2019-06-24 DIAGNOSIS — M0609 Rheumatoid arthritis without rheumatoid factor, multiple sites: Secondary | ICD-10-CM | POA: Diagnosis not present

## 2019-06-24 DIAGNOSIS — Z8669 Personal history of other diseases of the nervous system and sense organs: Secondary | ICD-10-CM

## 2019-06-24 DIAGNOSIS — Z8639 Personal history of other endocrine, nutritional and metabolic disease: Secondary | ICD-10-CM

## 2019-06-24 DIAGNOSIS — Z8719 Personal history of other diseases of the digestive system: Secondary | ICD-10-CM

## 2019-06-24 DIAGNOSIS — M19071 Primary osteoarthritis, right ankle and foot: Secondary | ICD-10-CM

## 2019-06-24 DIAGNOSIS — Z8709 Personal history of other diseases of the respiratory system: Secondary | ICD-10-CM

## 2019-06-24 DIAGNOSIS — Z79899 Other long term (current) drug therapy: Secondary | ICD-10-CM

## 2019-06-24 DIAGNOSIS — M19072 Primary osteoarthritis, left ankle and foot: Secondary | ICD-10-CM

## 2019-06-24 DIAGNOSIS — R7989 Other specified abnormal findings of blood chemistry: Secondary | ICD-10-CM | POA: Diagnosis not present

## 2019-06-24 DIAGNOSIS — R931 Abnormal findings on diagnostic imaging of heart and coronary circulation: Secondary | ICD-10-CM

## 2019-06-24 MED ORDER — PREDNISONE 5 MG PO TABS: ORAL_TABLET | ORAL | 0 refills | Status: DC

## 2019-06-24 NOTE — Patient Instructions (Addendum)
Standing Labs We placed an order today for your standing lab work.    Please come back and get your standing labs in 1 month and then 3 months.  We have open lab daily Monday through Thursday from 8:30-12:30 PM and 1:30-4:30 PM and Friday from 8:30-12:30 PM and 1:30-4:00 PM at the office of Dr. Bo Merino.   You may experience shorter wait times on Monday and Friday afternoons. The office is located at [address] [place], [place], [place] [address] No appointment is necessary.   Labs are drawn by [facility].  You may receive a bill from [facility] for your lab work.  If you wish to have your labs drawn at another location, please call the office 24 hours in advance to send orders.  If you have any questions regarding directions or hours of operation,  please call [phone].   Just as a reminder please drink plenty of water prior to coming for your lab work. Thanks!  Adalimumab Injection What is this medicine? ADALIMUMAB (a dal AYE mu mab) is used to treat rheumatoid and psoriatic arthritis. It is also used to treat ankylosing spondylitis, Crohn's disease, ulcerative colitis, plaque psoriasis, hidradenitis suppurativa, and uveitis. This medicine may be used for other purposes; ask your health care provider or pharmacist if you have questions. COMMON BRAND NAME(S): CYLTEZO, Humira What should I tell my health care provider before I take this medicine? They need to know if you have any of these conditions:  diabetes  heart disease  hepatitis B or history of hepatitis B infection  immune system problems  infection or history of infections  multiple sclerosis  recently received or scheduled to receive a vaccine  scheduled to have surgery  tuberculosis, a positive skin test for tuberculosis or have recently been in close contact with someone who has tuberculosis  an unusual reaction to adalimumab, other medicines, mannitol, latex, rubber, foods, dyes, or  preservatives  pregnant or trying to get pregnant  breast-feeding How should I use this medicine? This medicine is for injection under the skin. You will be taught how to prepare and give this medicine. Use exactly as directed. Take your medicine at regular intervals. Do not take your medicine more often than directed. A special MedGuide will be given to you by the pharmacist with each prescription and refill. Be sure to read this information carefully each time. It is important that you put your used needles and syringes in a special sharps container. Do not put them in a trash can. If you do not have a sharps container, call your pharmacist or healthcare provider to get one. Talk to your pediatrician regarding the use of this medicine in children. While this drug may be prescribed for children as young as 2 years for selected conditions, precautions do apply. The manufacturer of the medicine offers free information to patients and their health care partners. Call 704-423-6796 for more information. Overdosage: If you think you have taken too much of this medicine contact a poison control center or emergency room at once. NOTE: This medicine is only for you. Do not share this medicine with others. What if I miss a dose? If you miss a dose, take it as soon as you can. If it is almost time for your next dose, take only that dose. Do not take double or extra doses. Give the next dose when your next scheduled dose is due. Call your doctor or health care professional if you are not sure how to  handle a missed dose. What may interact with this medicine? Do not take this medicine with any of the following medications:  abatacept  anakinra  etanercept  infliximab  live virus vaccines  rilonacept This medicine may also interact with the following medications:  vaccines This list may not describe all possible interactions. Give your health care provider a list of all the medicines, herbs,  non-prescription drugs, or dietary supplements you use. Also tell them if you smoke, drink alcohol, or use illegal drugs. Some items may interact with your medicine. What should I watch for while using this medicine? Visit your doctor or health care professional for regular checks on your progress. Tell your doctor or healthcare professional if your symptoms do not start to get better or if they get worse. You will be tested for tuberculosis (TB) before you start this medicine. If your doctor prescribes any medicine for TB, you should start taking the TB medicine before starting this medicine. Make sure to finish the full course of TB medicine. Call your doctor or health care professional if you get a cold or other infection while receiving this medicine. Do not treat yourself. This medicine may decrease your body's ability to fight infection. Talk to your doctor about your risk of cancer. You may be more at risk for certain types of cancers if you take this medicine. What side effects may I notice from receiving this medicine? Side effects that you should report to your doctor or health care professional as soon as possible:  allergic reactions like skin rash, itching or hives, swelling of the face, lips, or tongue  breathing problems  changes in vision  chest pain  fever, chills, or any other sign of infection  numbness or tingling  red, scaly patches or raised bumps on the skin  swelling of the ankles  swollen lymph nodes in the neck, underarm, or groin areas  unexplained weight loss  unusual bleeding or bruising  unusually weak or tired Side effects that usually do not require medical attention (report to your doctor or health care professional if they continue or are bothersome):  headache  nausea  redness, itching, swelling, or bruising at site where injected This list may not describe all possible side effects. Call your doctor for medical advice about side effects. You  may report side effects to FDA at 1-800-FDA-1088. Where should I keep my medicine? Keep out of the reach of children. Store in the original container and in the refrigerator between 2 and 8 degrees C (36 and 46 degrees F). Do not freeze. The product may be stored in a cool carrier with an ice pack, if needed. Protect from light. Throw away any unused medicine after the expiration date. NOTE: This sheet is a summary. It may not cover all possible information. If you have questions about this medicine, talk to your doctor, pharmacist, or health care provider.  2020 Elsevier/Gold Standard (2018-06-24 13:22:46)

## 2019-06-24 NOTE — Telephone Encounter (Signed)
Please start BIV for Humira 40 mg every 14 days. Prior therapy includes: Methotrexate (elevated LFT's), Plaquenil and Enbrel (inadequate response).

## 2019-06-24 NOTE — Telephone Encounter (Signed)
Received Approval for patient through [employer]mymeds for Humira. Coverage dates are not shown. Fax will be sent to the office.  Patient must fill through [facility]. She is copay card eligible.  10:09 AM Beatriz Chancellor, CPhT

## 2019-06-24 NOTE — Progress Notes (Signed)
Pharmacy Note Subjective: Patient presents today to the [facility] to see Dr. Estanislado Pandy.   Patient seen by the pharmacist for counseling on Humira for rheumatoid arthritis.  Prior therapy includes: Methotrexate (elevated LFT's), Plaquenil and Enbrel (inadequate response).  Her last dose of Enbrel was 10/1.  Objective:  CBC    Component Value Date/Time   WBC 10.8 (H) 03/16/2019 1510   RBC 4.70 03/16/2019 1510   HGB 13.7 03/16/2019 1510   HCT 41.4 03/16/2019 1510   PLT 327 03/16/2019 1510   MCV 88.1 03/16/2019 1510   MCH 29.1 03/16/2019 1510   MCHC 33.1 03/16/2019 1510   RDW 12.7 03/16/2019 1510   LYMPHSABS 2.2 03/16/2019 1510   MONOABS 0.6 03/16/2019 1510   EOSABS 0.0 03/16/2019 1510   BASOSABS 0.1 03/16/2019 1510     CMP     Component Value Date/Time   NA 138 04/28/2019 1455   NA 137 07/25/2016   K 4.4 04/28/2019 1455   CL 101 04/28/2019 1455   CO2 29 04/28/2019 1455   GLUCOSE 91 04/28/2019 1455   BUN 14 04/28/2019 1455   CREATININE 0.78 04/28/2019 1455   CREATININE 0.70 03/13/2019 0928   CALCIUM 10.3 04/28/2019 1455   PROT 7.0 04/28/2019 1455   ALBUMIN 4.6 04/28/2019 1455   AST 37 04/28/2019 1455   ALT 42 (H) 04/28/2019 1455   ALKPHOS 74 04/28/2019 1455   BILITOT 0.7 04/28/2019 1455   GFRNONAA >60 03/16/2019 1510   GFRNONAA 101 03/13/2019 0928   GFRAA >60 03/16/2019 1510   GFRAA 117 03/13/2019 0928      Baseline Immunosuppressant Therapy Labs TB GOLD Quantiferon TB Gold Latest Ref Rng & Units 10/23/2018  Quantiferon TB Gold Plus NEGATIVE NEGATIVE   Hepatitis Panel Hepatitis Latest Ref Rng & Units 10/23/2018  Hep B Surface Ag NON-REACTI NON-REACTIVE  Hep B IgM NON-REACTI NON-REACTIVE  Hep C Ab NON-REACTI NON-REACTIVE  Hep C Ab NON-REACTI NON-REACTIVE  Hep A IgM NON-REACTI NON-REACTIVE   HIV Lab Results  Component Value Date   HIV NON-REACTIVE 10/23/2018   Immunoglobulins Immunoglobulin Electrophoresis Latest Ref Rng & Units 10/23/2018  IgA   47 - 310 mg/dL 195  IgG 600 - 1,640 mg/dL 890  IgM 50 - 300 mg/dL 115   SPEP Serum Protein Electrophoresis Latest Ref Rng & Units 04/28/2019  Total Protein 6.0 - 8.3 g/dL 7.0  Albumin 3.8 - 4.8 g/dL -  Alpha-1 0.2 - 0.3 g/dL -  Alpha-2 0.5 - 0.9 g/dL -  Beta Globulin 0.4 - 0.6 g/dL -  Beta 2 0.2 - 0.5 g/dL -  Gamma Globulin 0.8 - 1.7 g/dL -   G6PD Lab Results  Component Value Date   G6PDH 16.2 09/27/2018   TPMT No results found for: TPMT   Chest x-ray: no active cardiopulmonary disease  Does patient have diagnosis of heart failure?  No  Assessment/Plan:  Counseled patient that Humira is a TNF blocking agent.  Counseled patient on purpose, proper use, and adverse effects of Humira.  Reviewed the most common adverse effects including infections, headache, and injection site reactions. Discussed that there is the possibility of an increased risk of malignancy but it is not well understood if this increased risk is due to the medication or the disease state.  Advised patient to get yearly dermatology exams due to risk of skin cancer. Counseled patient that Humira should be held prior to scheduled surgery.  Counseled patient to avoid live vaccines while on Humira.  Advised patient to  get annual influenza vaccine and the pneumococcal vaccine as indicated.    Reviewed the importance of regular labs while on Humira therapy.  Standing orders placed.  Provided patient with medication education material and answered all questions.  Patient consented to Humira.  Will upload consent into the media tab.  Reviewed storage instructions of Humira.  Advised initial injection must be administered in office.  Patient verbalized understanding.  Dose will be for rheumatoid arthritis Humira 40 mg every 14 days.  Prescription pending lab results and/or insurance approval.  All questions encouraged and answered.  Instructed patient to call with any questions or concerns.  Mariella Saa, PharmD, [place],  [facility] Pharmacist [phone]  06/24/2019 9:55 AM

## 2019-06-24 NOTE — Telephone Encounter (Signed)
Patient notified.  Scheduled new start visit for 06/26/2019 at 9 AM.

## 2019-06-25 ENCOUNTER — Telehealth: Payer: Self-pay | Admitting: Rheumatology

## 2019-06-25 LAB — COMPLETE METABOLIC PANEL WITH GFR
AG Ratio: 1.8 (calc) (ref 1.0–2.5)
ALT: 32 U/L — ABNORMAL HIGH (ref 6–29)
AST: 35 U/L (ref 10–35)
Albumin: 4.5 g/dL (ref 3.6–5.1)
Alkaline phosphatase (APISO): 55 U/L (ref 37–153)
BUN: 15 mg/dL (ref 7–25)
CO2: 29 mmol/L (ref 20–32)
Calcium: 9.7 mg/dL (ref 8.6–10.4)
Chloride: 103 mmol/L (ref 98–110)
Creat: 0.71 mg/dL (ref 0.50–1.05)
GFR, Est African American: 114 mL/min/{1.73_m2} (ref >=60)
GFR, Est Non African American: 99 mL/min/{1.73_m2} (ref >=60)
Globulin: 2.5 g/dL (calc) (ref 1.9–3.7)
Glucose, Bld: 76 mg/dL (ref 65–99)
Potassium: 4 mmol/L (ref 3.5–5.3)
Sodium: 140 mmol/L (ref 135–146)
Total Bilirubin: 0.8 mg/dL (ref 0.2–1.2)
Total Protein: 7 g/dL (ref 6.1–8.1)

## 2019-06-25 LAB — CBC WITH DIFFERENTIAL/PLATELET
Absolute Monocytes: 522 cells/uL (ref 200–950)
Basophils Absolute: 60 cells/uL (ref 0–200)
Basophils Relative: 1 %
Eosinophils Absolute: 102 cells/uL (ref 15–500)
Eosinophils Relative: 1.7 %
HCT: 41.2 % (ref 35.0–45.0)
Hemoglobin: 13.5 g/dL (ref 11.7–15.5)
Lymphs Abs: 2088 cells/uL (ref 850–3900)
MCH: 28.5 pg (ref 27.0–33.0)
MCHC: 32.8 g/dL (ref 32.0–36.0)
MCV: 86.9 fL (ref 80.0–100.0)
MPV: 11.6 fL (ref 7.5–12.5)
Monocytes Relative: 8.7 %
Neutro Abs: 3228 cells/uL (ref 1500–7800)
Neutrophils Relative %: 53.8 %
Platelets: 262 10*3/uL (ref 140–400)
RBC: 4.74 10*6/uL (ref 3.80–5.10)
RDW: 12.9 % (ref 11.0–15.0)
Total Lymphocyte: 34.8 %
WBC: 6 10*3/uL (ref 3.8–10.8)

## 2019-06-25 NOTE — Telephone Encounter (Signed)
Spoke with patient and advised patient to hold Enbrel until she has her daughter's results. Advised patient if her daughter's results are positive she will need to be tested as well. Patient advised she may continue her PLQ. Patient advised due to elevated LFTs she could not use ibuprofen or Tylenol. Patient will contact the office with results and to reschedule the her new start visit for Humira.

## 2019-06-25 NOTE — Telephone Encounter (Signed)
Patient called stating her daughter (who lives with her) was tested for COVID today due to having symptoms.  Patient states she was scheduled for nurse appointment tomorrow 06/26/19 to change medications from Enbrel to Humira.  Patient is requesting a return call to let her know if she should continue with her Enbrel.

## 2019-06-26 ENCOUNTER — Other Ambulatory Visit: Payer: Self-pay

## 2019-06-26 ENCOUNTER — Ambulatory Visit: Payer: BC Managed Care – PPO

## 2019-06-26 DIAGNOSIS — Z20822 Contact with and (suspected) exposure to covid-19: Secondary | ICD-10-CM

## 2019-06-26 DIAGNOSIS — Z20828 Contact with and (suspected) exposure to other viral communicable diseases: Secondary | ICD-10-CM | POA: Diagnosis not present

## 2019-06-27 LAB — NOVEL CORONAVIRUS, NAA: SARS-CoV-2, NAA: NOT DETECTED

## 2019-06-30 ENCOUNTER — Encounter: Payer: Self-pay | Admitting: Family Medicine

## 2019-06-30 ENCOUNTER — Encounter: Payer: BC Managed Care – PPO | Admitting: Family Medicine

## 2019-07-03 ENCOUNTER — Other Ambulatory Visit: Payer: Self-pay

## 2019-07-03 ENCOUNTER — Ambulatory Visit (INDEPENDENT_AMBULATORY_CARE_PROVIDER_SITE_OTHER): Payer: BC Managed Care – PPO | Admitting: Pharmacist

## 2019-07-03 VITALS — BP 116/77 | HR 80

## 2019-07-03 DIAGNOSIS — M0609 Rheumatoid arthritis without rheumatoid factor, multiple sites: Secondary | ICD-10-CM

## 2019-07-03 MED ORDER — HUMIRA (2 PEN) 40 MG/0.4ML ~~LOC~~ AJKT: 40.0000 mg | AUTO-INJECTOR | SUBCUTANEOUS | 0 refills | Status: DC

## 2019-07-03 MED ORDER — HUMIRA (2 PEN) 40 MG/0.4ML ~~LOC~~ AJKT: 40.0000 mg | AUTO-INJECTOR | Freq: Once | SUBCUTANEOUS | 0 refills | Status: DC

## 2019-07-03 NOTE — Progress Notes (Signed)
Pharmacy Note  Subjective:   Patient is being initiated on Humira.  Patient was previously counseled extensively on and consented to initiation of Humira at that time.  Patient presents to clinic today to receive the first dose of Humira.     Objective: CMP     Component Value Date/Time   NA 140 06/24/2019 0953   NA 137 07/25/2016   K 4.0 06/24/2019 0953   CL 103 06/24/2019 0953   CO2 29 06/24/2019 0953   GLUCOSE 76 06/24/2019 0953   BUN 15 06/24/2019 0953   CREATININE 0.71 06/24/2019 0953   CALCIUM 9.7 06/24/2019 0953   PROT 7.0 06/24/2019 0953   ALBUMIN 4.6 04/28/2019 1455   AST 35 06/24/2019 0953   ALT 32 (H) 06/24/2019 0953   ALKPHOS 74 04/28/2019 1455   BILITOT 0.8 06/24/2019 0953   GFRNONAA 99 06/24/2019 0953   GFRAA 114 06/24/2019 0953    CBC    Component Value Date/Time   WBC 6.0 06/24/2019 0953   RBC 4.74 06/24/2019 0953   HGB 13.5 06/24/2019 0953   HCT 41.2 06/24/2019 0953   PLT 262 06/24/2019 0953   MCV 86.9 06/24/2019 0953   MCH 28.5 06/24/2019 0953   MCHC 32.8 06/24/2019 0953   RDW 12.9 06/24/2019 0953   LYMPHSABS 2,088 06/24/2019 0953   MONOABS 0.6 03/16/2019 1510   EOSABS 102 06/24/2019 0953   BASOSABS 60 06/24/2019 0953    Baseline Immunosuppressant Therapy Labs TB GOLD Quantiferon TB Gold Latest Ref Rng & Units 10/23/2018  Quantiferon TB Gold Plus NEGATIVE NEGATIVE   Hepatitis Panel Hepatitis Latest Ref Rng & Units 10/23/2018  Hep B Surface Ag NON-REACTI NON-REACTIVE  Hep B IgM NON-REACTI NON-REACTIVE  Hep C Ab NON-REACTI NON-REACTIVE  Hep C Ab NON-REACTI NON-REACTIVE  Hep A IgM NON-REACTI NON-REACTIVE   HIV Lab Results  Component Value Date   HIV NON-REACTIVE 10/23/2018   Immunoglobulins Immunoglobulin Electrophoresis Latest Ref Rng & Units 10/23/2018  IgA  47 - 310 mg/dL 195  IgG 600 - 1,640 mg/dL 890  IgM 50 - 300 mg/dL 115   SPEP Serum Protein Electrophoresis Latest Ref Rng & Units 06/24/2019  Total Protein 6.1 - 8.1 g/dL 7.0   Albumin 3.8 - 4.8 g/dL -  Alpha-1 0.2 - 0.3 g/dL -  Alpha-2 0.5 - 0.9 g/dL -  Beta Globulin 0.4 - 0.6 g/dL -  Beta 2 0.2 - 0.5 g/dL -  Gamma Globulin 0.8 - 1.7 g/dL -   G6PD Lab Results  Component Value Date   G6PDH 16.2 09/27/2018   TPMT No results found for: TPMT   Chest x-ray: no active cardiopulmonary disease 10/23/2018  Patient running a fever or have signs/symptoms of infection? No  Assessment/Plan:  Demonstrated proper injection technique with Humira demo pen.  Patient able to demonstrate proper injection technique using the teach back method. Patient self injected in the left anterior thigh with:  Sample Medication: Humira 40 mg/0.67ml NDC: [id number] Lot: [id number] Expiration: 02/2020  Patient tolerated well.  Observed for 30 mins in office for adverse reaction and none noted.   Patient is to return in 1 month for follow up appointment and labs.  Standing orders placed. [facility] by [facility].  She was given a co-pay card in office.  All questions encouraged and answered.  Instructed patient to call with any further questions or concerns.  Mariella Saa, PharmD, [facility] Rheumatology Clinical Pharmacist  07/03/2019 8:36 AM

## 2019-07-03 NOTE — Patient Instructions (Addendum)
Standing Labs We placed an order today for your standing lab work.    Please come back and get your standing labs in 1 month and then every 3 months.  We have open lab daily Monday through Thursday from 8:30-12:30 PM and 1:30-4:30 PM and Friday from 8:30-12:30 PM and 1:30-4:00 PM at the office of Dr. Shaili Deveshwar.   You may experience shorter wait times on Monday and Friday afternoons. The office is located at [address], [address], [place], [place] [address] No appointment is necessary.   Labs are drawn by [facility].  You may receive a bill from [facility] for your lab work.  If you wish to have your labs drawn at another location, please call the office 24 hours in advance to send orders.  If you have any questions regarding directions or hours of operation,  please call [phone].   Just as a reminder please drink plenty of water prior to coming for your lab work. Thanks!  Helpful Tips for Injecting To help alleviate pain and injection site reactions consider the following tips:  . Placing something cold (like and ice gel pack or cold water bottle) on the injection site just before cleansing with alcohol may help reduce pain . If you have a localized reaction (redness, mild swelling, warmth, and itching) you can use topical corticosteroids (hydrocortisone cream) or antihistamine (Claritin) to help minimize reaction the day of, the day before, and the day after injecting . Always inject this medication at room temperature (remove from refrigerator 15-20 minutes before injecting; this may help eliminate stinging). . Always rotate your injection sites using inside/outside thigh of both legs, abdomen divided into 4 quadrants (stay away from waist line, and 2" away from navel). . Do not inject into areas where skin is tender, bruised, red, or hard or where there are scars or stretch marks. . Always let alcohol dry on the skin before injecting. . Place a cold, damp towel or small ice  pack on the injection site for 10 or 15 minutes every 1 to 2 hours if it hurts or is swollen.   

## 2019-07-04 ENCOUNTER — Encounter: Payer: Self-pay | Admitting: Family Medicine

## 2019-07-07 ENCOUNTER — Other Ambulatory Visit: Payer: Self-pay | Admitting: Rheumatology

## 2019-07-07 DIAGNOSIS — M1230 Palindromic rheumatism, unspecified site: Secondary | ICD-10-CM

## 2019-07-07 NOTE — Telephone Encounter (Signed)
Last Visit: 06/24/19 Next visit: 07/31/19 Labs: 06/24/19 CBC WNL. ALT is borderline elevated but trending down. AST WNL. Rest of CMP WNL PLQ Eye Exam: 04/09/19 WNL   Okay to refill per Dr. Estanislado Pandy

## 2019-07-13 ENCOUNTER — Other Ambulatory Visit: Payer: Self-pay | Admitting: Rheumatology

## 2019-07-13 DIAGNOSIS — M1230 Palindromic rheumatism, unspecified site: Secondary | ICD-10-CM

## 2019-07-14 ENCOUNTER — Encounter: Payer: Self-pay | Admitting: Rheumatology

## 2019-07-16 DIAGNOSIS — Z23 Encounter for immunization: Secondary | ICD-10-CM | POA: Diagnosis not present

## 2019-07-17 NOTE — Progress Notes (Signed)
Office Visit Note  Patient: [name]             Date of Birth: Feb 03, 1966           MRN: [id number]             PCP: Orma Flaming, MD Referring: Orma Flaming, MD Visit Date: 07/31/2019 Occupation: @GUAROCC @  Subjective:  Trochanteric bursitis bilaterally      History of Present Illness: [name] is a 53 y.o. female with history of seronegative rheumatoid arthritis and osteoarthritis.  Patient was started on Humira 40 mg subcutaneous injections every 14 days in October 2020.  She is due for her third injection today.  She continues take Plaquenil 200 mg 1 tablet by mouth twice daily.  She has not noticed much improvement since switching from Enbrel to Humira.  She continues to have inflammation in the right ankle joint.  She is also having discomfort in the right wrist.  She denies any joint swelling.  She states that her fatigue has improved.  She is continuing to have trochanter bursitis bilaterally.  She perform stretching exercises on a daily basis.    Activities of Daily Living:  Patient reports morning stiffness for 3-4  hours.   Patient Denies nocturnal pain.  Difficulty dressing/grooming: Denies Difficulty climbing Hudson: Reports Difficulty getting out of chair: Denies Difficulty using hands for taps, buttons, cutlery, and/or writing: Denies  Review of Systems  Constitutional: Negative for fatigue.  HENT: Positive for mouth dryness. Negative for mouth sores and nose dryness.   Eyes: Positive for dryness. Negative for pain and visual disturbance.  Respiratory: Positive for wheezing. Negative for cough, hemoptysis, shortness of breath and difficulty breathing.   Cardiovascular: Negative for chest pain, palpitations, hypertension and swelling in legs/feet.  Gastrointestinal: Negative for blood in stool, constipation and diarrhea.  Endocrine: Negative for increased urination.  Genitourinary: Negative for painful urination.  Musculoskeletal: Positive for  arthralgias, joint pain, joint swelling and morning stiffness. Negative for myalgias, muscle weakness, muscle tenderness and myalgias.  Skin: Negative for color change, pallor, rash, hair loss, nodules/bumps, skin tightness, ulcers and sensitivity to sunlight.  Allergic/Immunologic: Negative for susceptible to infections.  Neurological: Negative for dizziness, numbness, headaches and weakness.  Hematological: Negative for swollen glands.  Psychiatric/Behavioral: Negative for depressed mood and sleep disturbance. The patient is not nervous/anxious.     PMFS History:  Patient Active Problem List   Diagnosis Date Noted  . Left bundle branch block 04/28/2019  . Right wrist pain 03/13/2019  . Hamstring strain, left, initial encounter 02/20/2019  . 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 Primary osteoarthritis of both feet 10/09/2018  . Hx of migraines 09/27/2018  . History of gastroesophageal reflux (GERD) 09/27/2018  . 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 Moderate persistent asthma 07/05/2018  . Hypothyroid 06/28/2018    Past Medical History:  Diagnosis Date  . Arthritis   . Asthma   . Frequent headaches   . GERD (gastroesophageal reflux disease)   . H/O seasonal allergies   . History of chicken pox   . Migraines   . Thyroid disease     Family History  Problem Relation Age of Onset  . Arthritis Mother   . Cancer Mother        breast ca  . Miscarriages / [place] Mother   . Diabetes Father   . Heart attack Father   . Heart disease Father   . Hyperlipidemia Father   . Hypertension Father   . Stroke  Father        x3  . COPD Sister   . Peripheral Artery Disease Sister   . Asthma Brother   . Birth defects Brother   . Cancer Brother        skin  . Hyperlipidemia Brother   . Heart attack Maternal Grandmother   . Heart disease Maternal Grandmother   . Hyperlipidemia Maternal Grandmother   . Stroke Maternal Grandmother   . Alcohol abuse Paternal  Grandmother   . Cancer Paternal Grandmother   . Cancer Paternal Grandfather        breast  . Heart attack Paternal Grandfather   . Alcohol abuse Brother   . Early death Brother        suicide  . Mental illness Brother   . Asthma Daughter   . Asthma Son   . Asthma Son    Past Surgical History:  Procedure Laterality Date  . CARDIAC CATHETERIZATION    . CHOLECYSTECTOMY  1995  . [place]  2009  . HERNIA REPAIR  2017  . LAPAROSCOPIC HYSTERECTOMY  2006  . THYROID SURGERY     Social History   Social History Narrative  . Not on file   Immunization History  Administered Date(s) Administered  . Influenza,inj,Quad PF,6+ Mos 06/28/2018, 06/18/2019  . Influenza-Unspecified 01/25/2018  . Pneumococcal Polysaccharide-23 10/22/2018  . Tdap 06/28/2018  . Zoster Recombinat (Shingrix) 04/15/2019     Objective: Vital Signs: BP 121/76 (BP Location: Left Arm, Patient Position: Sitting, Cuff Size: Normal)   Pulse 72   Resp 13   Ht 5' 7.5" (1.715 m)   Wt 172 lb 6.4 oz (78.2 kg)   LMP  (LMP Unknown)   BMI 26.60 kg/m    Physical Exam Vitals signs and nursing note reviewed.  Constitutional:      Appearance: She is well-developed.  HENT:     Head: Normocephalic and atraumatic.  Eyes:     Conjunctiva/sclera: Conjunctivae normal.  Neck:     Musculoskeletal: Normal range of motion.  Cardiovascular:     Rate and Rhythm: Normal rate and regular rhythm.     Heart sounds: Normal heart sounds.  Pulmonary:     Effort: Pulmonary effort is normal.     Breath sounds: Normal breath sounds.  Abdominal:     General: Bowel sounds are normal.     Palpations: Abdomen is soft.  Lymphadenopathy:     Cervical: No cervical adenopathy.  Skin:    General: Skin is warm and dry.     Capillary Refill: Capillary refill takes less than 2 seconds.  Neurological:     Mental Status: She is alert and oriented to person, place, and time.  Psychiatric:        Behavior: Behavior normal.       Musculoskeletal Exam: C-spine, thoracic spine, and lumbar spine good ROM.  No midline spinal tenderness.  No SI joint tenderness. Shoulder joints and elbow joints good ROM with no discomfort.  Painful extension of the right wrist.  Left wrist good ROM with no discomfort.  MCPs, PIPs, and DIPs good ROM with no synovitis.  Hip joints and knee joints good ROM with no discomfort.  No warmth or effusion of knee joints.  Tenderness and warmth of the right ankle joint.  No tenderness of left ankle joint.  No tenderness of MTP joints.   CDAI Exam: CDAI Score: 1.8  Patient Global: 4 mm; Provider Global: 4 mm Swollen: 1 ; Tender: 2  Joint Exam  Right  Left  Wrist   Tender     Ankle  Swollen Tender        Investigation: No additional findings.  Imaging: No results found.  Recent Labs: Lab Results  Component Value Date   WBC 6.0 06/24/2019   HGB 13.5 06/24/2019   PLT 262 06/24/2019   NA 140 06/24/2019   K 4.0 06/24/2019   CL 103 06/24/2019   CO2 29 06/24/2019   GLUCOSE 76 06/24/2019   BUN 15 06/24/2019   CREATININE 0.71 06/24/2019   BILITOT 0.6 07/28/2019   ALKPHOS 64 07/28/2019   AST 34 07/28/2019   ALT 30 07/28/2019   PROT 6.5 07/28/2019   ALBUMIN 4.5 07/28/2019   CALCIUM 9.7 06/24/2019   GFRAA 114 06/24/2019   QFTBGOLDPLUS NEGATIVE 10/23/2018    Speciality Comments: PLQ Eye Exam: 04/09/19 WNL [facility] Follow up in 1 year.   Procedures:  No procedures performed Allergies: Shellfish allergy, Gluten meal, Lortab [hydrocodone-acetaminophen], Morphine and related, and Strawberry (diagnostic)   Assessment / Plan:     Visit Diagnoses: Rheumatoid arthritis of multiple sites with negative rheumatoid factor ([place]) - Diagnosed at [facility]. The ultrasound examination performed showed synovitis in bilateral hands: She has tenderness and warmth of the right ankle joint and tenderness of the right wrist joint on exam.  She has painful extension of the  right wrist joint.  She continues to have morning stiffness lasting about 3-4 hours.  She was started on Humira 40 mg sq injections every 14 days in October.  She is due for her 3rd injection today.  She continues to taking Plaquenil 200 mg 1 tablet by mouth twice daily.  She has not noticed much improvement since switching from Enbrel to Humira.  We will reassess how she is doing in 2 months.  She will follow up in the office in 2 months.   High risk medication use -Humira 40 mg every 14 days started in October 2020 and Plaquenil 200 mg 1 tablet twice daily.  Last TB gold negative on 10/23/2018 and will monitor yearly.  Last Plaquenil eye exam normal on 04/09/2019.  Most recent CBC/CMP within normal limits on 06/24/2019.  Due for CBC/BMP today and will monitor every 3 months. She had a hepatic panel which was WNL on 07/28/19.   (Inadequate response to Enbrel)  Primary osteoarthritis of both feet: She has ongoing tenderness and warmth of the right ankle joint.  She has been experiencing stiffness in the right ankle joint lasting all day.  She has no tenderness of MTP joints at this time.   Trochanteric bursitis of both hips: She has tenderness over bilateral trochanteric bursa.  She performs stretching exercises on a daily basis.   Other medical conditions are listed as follows:   Elevated LFTs: LFTs WNL on 07/28/19   Decreased cardiac ejection fraction  History of asthma  History of gastroesophageal reflux (GERD)  History of hypothyroidism  Hx of migraines  Orders: Orders Placed This Encounter  Procedures  . CBC  . Basic Metabolic Panel (BMET)   No orders of the defined types were placed in this encounter.     Follow-Up Instructions: Return in about 2 months (around 09/30/2019) for Rheumatoid arthritis.   Ofilia Neas, PA-C   I examined and evaluated the patient with Hazel Sams PA.  Patient is clinically doing much better on Humira.  She has had only 2 doses so far.  She had some  warmth on  palpation of her right ankle joint on my exam.  I offered cortisone injection which she declined.  I have advised her to contact us if she has persistent pain in her right ankle.  The plan of care was discussed as noted above.  Bo Merino, MD  Note - This record has been created using Editor, commissioning.  Chart creation errors have been sought, but may not always  have been located. Such creation errors do not reflect on  the standard of medical care.

## 2019-07-20 DIAGNOSIS — Z8616 Personal history of COVID-19: Secondary | ICD-10-CM

## 2019-07-20 HISTORY — DX: Personal history of COVID-19: Z86.16

## 2019-07-24 ENCOUNTER — Ambulatory Visit: Payer: BC Managed Care – PPO | Admitting: Physician Assistant

## 2019-07-28 ENCOUNTER — Other Ambulatory Visit: Payer: Self-pay

## 2019-07-28 ENCOUNTER — Other Ambulatory Visit: Payer: BC Managed Care – PPO | Admitting: *Deleted

## 2019-07-28 DIAGNOSIS — E785 Hyperlipidemia, unspecified: Secondary | ICD-10-CM

## 2019-07-28 DIAGNOSIS — Z79899 Other long term (current) drug therapy: Secondary | ICD-10-CM | POA: Diagnosis not present

## 2019-07-28 LAB — HEPATIC FUNCTION PANEL
ALT: 30 IU/L (ref 0–32)
AST: 34 IU/L (ref 0–40)
Albumin: 4.5 g/dL (ref 3.8–4.9)
Alkaline Phosphatase: 64 IU/L (ref 39–117)
Bilirubin Total: 0.6 mg/dL (ref 0.0–1.2)
Bilirubin, Direct: 0.18 mg/dL (ref 0.00–0.40)
Total Protein: 6.5 g/dL (ref 6.0–8.5)

## 2019-07-28 LAB — LIPID PANEL
Chol/HDL Ratio: 2.1 ratio (ref 0.0–4.4)
Cholesterol, Total: 147 mg/dL (ref 100–199)
HDL: 71 mg/dL (ref >39)
LDL Chol Calc (NIH): 63 mg/dL (ref 0–99)
Triglycerides: 67 mg/dL (ref 0–149)
VLDL Cholesterol Cal: 13 mg/dL (ref 5–40)

## 2019-07-31 ENCOUNTER — Other Ambulatory Visit: Payer: Self-pay

## 2019-07-31 ENCOUNTER — Ambulatory Visit: Payer: BC Managed Care – PPO | Admitting: Rheumatology

## 2019-07-31 ENCOUNTER — Encounter: Payer: Self-pay | Admitting: Rheumatology

## 2019-07-31 VITALS — BP 121/76 | HR 72 | Resp 13 | Ht 67.5 in | Wt 172.4 lb

## 2019-07-31 DIAGNOSIS — M7062 Trochanteric bursitis, left hip: Secondary | ICD-10-CM

## 2019-07-31 DIAGNOSIS — R931 Abnormal findings on diagnostic imaging of heart and coronary circulation: Secondary | ICD-10-CM

## 2019-07-31 DIAGNOSIS — Z8669 Personal history of other diseases of the nervous system and sense organs: Secondary | ICD-10-CM

## 2019-07-31 DIAGNOSIS — M7061 Trochanteric bursitis, right hip: Secondary | ICD-10-CM

## 2019-07-31 DIAGNOSIS — R7989 Other specified abnormal findings of blood chemistry: Secondary | ICD-10-CM | POA: Diagnosis not present

## 2019-07-31 DIAGNOSIS — Z8639 Personal history of other endocrine, nutritional and metabolic disease: Secondary | ICD-10-CM

## 2019-07-31 DIAGNOSIS — M19072 Primary osteoarthritis, left ankle and foot: Secondary | ICD-10-CM

## 2019-07-31 DIAGNOSIS — M19071 Primary osteoarthritis, right ankle and foot: Secondary | ICD-10-CM | POA: Diagnosis not present

## 2019-07-31 DIAGNOSIS — Z8719 Personal history of other diseases of the digestive system: Secondary | ICD-10-CM

## 2019-07-31 DIAGNOSIS — Z79899 Other long term (current) drug therapy: Secondary | ICD-10-CM

## 2019-07-31 DIAGNOSIS — Z8709 Personal history of other diseases of the respiratory system: Secondary | ICD-10-CM

## 2019-07-31 DIAGNOSIS — M0609 Rheumatoid arthritis without rheumatoid factor, multiple sites: Secondary | ICD-10-CM

## 2019-08-01 ENCOUNTER — Telehealth: Payer: Self-pay

## 2019-08-01 ENCOUNTER — Other Ambulatory Visit: Payer: Self-pay

## 2019-08-01 DIAGNOSIS — Z20822 Contact with and (suspected) exposure to covid-19: Secondary | ICD-10-CM

## 2019-08-01 LAB — CBC WITH DIFFERENTIAL/PLATELET
Absolute Monocytes: 577 cells/uL (ref 200–950)
Basophils Absolute: 57 cells/uL (ref 0–200)
Basophils Relative: 1.1 %
Eosinophils Absolute: 68 cells/uL (ref 15–500)
Eosinophils Relative: 1.3 %
HCT: 39.9 % (ref 35.0–45.0)
Hemoglobin: 13.4 g/dL (ref 11.7–15.5)
Lymphs Abs: 967 cells/uL (ref 850–3900)
MCH: 28.9 pg (ref 27.0–33.0)
MCHC: 33.6 g/dL (ref 32.0–36.0)
MCV: 86 fL (ref 80.0–100.0)
MPV: 11.4 fL (ref 7.5–12.5)
Monocytes Relative: 11.1 %
Neutro Abs: 3531 cells/uL (ref 1500–7800)
Neutrophils Relative %: 67.9 %
Platelets: 239 10*3/uL (ref 140–400)
RBC: 4.64 10*6/uL (ref 3.80–5.10)
RDW: 12.7 % (ref 11.0–15.0)
Total Lymphocyte: 18.6 %
WBC: 5.2 10*3/uL (ref 3.8–10.8)

## 2019-08-01 LAB — BASIC METABOLIC PANEL
BUN: 12 mg/dL (ref 7–25)
CO2: 28 mmol/L (ref 20–32)
Calcium: 9.7 mg/dL (ref 8.6–10.4)
Chloride: 103 mmol/L (ref 98–110)
Creat: 0.76 mg/dL (ref 0.50–1.05)
Glucose, Bld: 80 mg/dL (ref 65–99)
Potassium: 4.5 mmol/L (ref 3.5–5.3)
Sodium: 138 mmol/L (ref 135–146)

## 2019-08-01 NOTE — Telephone Encounter (Signed)
Patient called and states she took Humira injection last night at 5:30pm. Patient states she woke up during the night with a temp of 102.9 and 103.2. patient has taken tylenol and temp is now 100.0. Patient states she is also experiencing body aches and sinus/ear pressure. Patient states she felt fine prior to injecting. I advised patient to contact her PCP for recommendations and advised they may have her get tested for COVID. I advised patient to hold Humira while experiencing fever or any signs/symptoms of infection. Patient verbalized understanding and will contact her PCP this morning.

## 2019-08-01 NOTE — Progress Notes (Signed)
BMP WNL.  CBC WNL.

## 2019-08-04 ENCOUNTER — Encounter: Payer: Self-pay | Admitting: Family Medicine

## 2019-08-04 ENCOUNTER — Ambulatory Visit: Payer: Self-pay | Admitting: *Deleted

## 2019-08-04 LAB — NOVEL CORONAVIRUS, NAA: SARS-CoV-2, NAA: DETECTED — AB

## 2019-08-04 NOTE — Telephone Encounter (Signed)
Patient called to report positive COVID results. Patient advised to hold humira while having symptoms and 2 weeks asymptomatic per Dr. Estanislado Pandy.   Patient states she is coughing up "white mucus." I advised patient to contact her PCP regarding her COVID symptoms.

## 2019-08-04 NOTE — Telephone Encounter (Signed)
[facility] notified of encounter. . Reason for Disposition . [1] Caller requesting NON-URGENT health information AND [2] PCP's office is the best resource  Answer Assessment - Initial Assessment Questions 1. REASON FOR CALL or QUESTION: "What is your reason for calling today?" or "How can I best help you?" or "What question do you have that I can help answer?" Questions regarding medications with covid.  Protocols used: INFORMATION ONLY CALL - NO TRIAGE-A-AH

## 2019-08-04 NOTE — Telephone Encounter (Addendum)
Pt called and stated that she tested positive for covid and would like a call back. Pt states that she has asthma and would like to know what she should do. Pt states that she has a cough.   Returned call to patient regarding being positive for covid 19 and she has a hx of asthma and has taken Humira last week. She has cough since Friday and she is coughing up thick, white phlegm. No fever. She wants advice on what other medication she should be taking. She viewed her positive result in MyChart. And she was given advice on being in quarantine and symptoms per lab results. She would like a call back regarding this. Routing to LB at [facility] for review and recommendations.

## 2019-08-06 ENCOUNTER — Other Ambulatory Visit: Payer: Self-pay | Admitting: Family Medicine

## 2019-08-07 ENCOUNTER — Ambulatory Visit (INDEPENDENT_AMBULATORY_CARE_PROVIDER_SITE_OTHER): Payer: BC Managed Care – PPO | Admitting: Family Medicine

## 2019-08-07 ENCOUNTER — Encounter: Payer: Self-pay | Admitting: Family Medicine

## 2019-08-07 VITALS — HR 97 | Temp 98.4°F | Ht 67.5 in | Wt 170.0 lb

## 2019-08-07 DIAGNOSIS — U071 COVID-19: Secondary | ICD-10-CM

## 2019-08-07 DIAGNOSIS — J4531 Mild persistent asthma with (acute) exacerbation: Secondary | ICD-10-CM | POA: Diagnosis not present

## 2019-08-07 MED ORDER — PREDNISONE 20 MG PO TABS: 40.0000 mg | ORAL_TABLET | Freq: Every day | ORAL | 0 refills | Status: DC

## 2019-08-07 MED ORDER — GUAIFENESIN-CODEINE 100-10 MG/5ML PO SOLN: 10.0000 mL | Freq: Four times a day (QID) | ORAL | 0 refills | Status: DC | PRN

## 2019-08-07 NOTE — Progress Notes (Signed)
Patient: [name] MRN: [id number] DOB: Nov 11, 1965 PCP: Orma Flaming, MD     I connected with [name] on 08/07/19 at 10:13am by a video enabled telemedicine application and verified that I am speaking with the correct person using two identifiers.  Location patient: Home Location provider:  [facility], Office Persons participating in this virtual visit: [name] and DR. Rogers Blocker   I discussed the limitations of evaluation and management by telemedicine and the availability of in person appointments. The patient expressed understanding and agreed to proceed.   Subjective:  Chief Complaint  Patient presents with  . COVID-19  . Cough    HPI: The patient is a 53 y.o. female who presents today for cough (patient is ashtmatic but has tested positive for covid-19) headache, body aches, chills. She started to feel bad last Thursday in the middle of the night. She got tested for covid and it came back positive on Monday of this week. Her main complaints are restless leg and insomnia. She has not had a fever. She is having coughing fits that is productive with white mucous. She also has a sore throat, rhinorrhea and ear pain. Her breathing is okay and her pulse ox is good at 97% or more. She does have some chest tightness, but her oxygenation is good. She is on day 7 today. She does have asthma which is seeming to be a little exacerbated. She is needing her rescue inhaler much more, but it's helping. She has been using a cough suppressant over the counter. This is helping some.  She is just concerned about what she is coughing up and her asthma.   She is following quarantine guidelines at home.   Review of Systems  Constitutional: Positive for chills. Negative for fever.  HENT: Positive for ear pain, postnasal drip, rhinorrhea and sore throat. Negative for congestion and trouble swallowing.   Eyes: Positive for redness. Negative for visual disturbance.       Pt is c/o dry eyes   Respiratory: Positive for cough, chest tightness and shortness of breath. Negative for wheezing.   Cardiovascular: Positive for chest pain. Negative for palpitations and leg swelling.  Gastrointestinal: Positive for diarrhea, nausea and vomiting. Negative for abdominal pain.  Endocrine: Negative for cold intolerance and heat intolerance.  Musculoskeletal: Positive for back pain, myalgias and neck pain.  Skin:       Pt c/o skin feels sore all over body  Neurological: Positive for dizziness and headaches.  Psychiatric/Behavioral: Positive for sleep disturbance.    Allergies Patient is allergic to shellfish allergy; gluten meal; lortab [hydrocodone-acetaminophen]; morphine and related; and strawberry (diagnostic).  Past Medical History Patient  has a past medical history of Arthritis, Asthma, Frequent headaches, GERD (gastroesophageal reflux disease), H/O seasonal allergies, History of chicken pox, Migraines, and Thyroid disease.  Surgical History Patient  has a past surgical history that includes Cholecystectomy (1995); Laparoscopic hysterectomy (2006); Thyroid surgery; Cardiac catheterization; Hammer toe surgery (2009); and Hernia repair (2017).  Family History Pateint's family history includes Alcohol abuse in her brother and paternal grandmother; Arthritis in her mother; Asthma in her brother, daughter, son, and son; Birth defects in her brother; COPD in her sister; Cancer in her brother, mother, paternal grandfather, and paternal grandmother; Diabetes in her father; Early death in her brother; Heart attack in her father, maternal grandmother, and paternal grandfather; Heart disease in her father and maternal grandmother; Hyperlipidemia in her brother, father, and maternal grandmother; Hypertension in her father; Mental illness in her brother; Miscarriages /  Stillbirths in her mother; Peripheral Artery Disease in her sister; Stroke in her father and maternal grandmother.  Social  History Patient  reports that she has never smoked. She has never used smokeless tobacco. She reports current alcohol use. She reports that she does not use drugs.    Objective: Vitals:   08/07/19 0951  Pulse: 97  Temp: 98.4 F (36.9 C)  TempSrc: Skin  Weight: 170 lb (77.1 kg)  Height: 5' 7.5" (1.715 m)    Body mass index is 26.23 kg/m.  Physical Exam Vitals signs reviewed.  Constitutional:      General: She is not in acute distress.    Appearance: Normal appearance. She is not ill-appearing.     Comments: hoarse  HENT:     Head: Normocephalic and atraumatic.  Pulmonary:     Effort: Pulmonary effort is normal.  Neurological:     General: No focal deficit present.     Mental Status: She is alert and oriented to person, place, and time.  Psychiatric:        Mood and Affect: Mood normal.        Behavior: Behavior normal.        Assessment/plan: 1. COVID-19 Conservative therapy discussed. Cool mist humidifier honey, cough suppressants otc. Will send in codeine cough syrup to help at night with her insomnia and with cough. precautions given for drowsiness and with her asthma. Discussed that day 7 can be a turning point with covid and she is at that marker today. Continue with pulse ox checks and discussed quarantining guidelines. If fever, chills, increasing shortness of breath I will want to CXR to rule out any pneumonia. ER precautions given.   2. Mild persistent asthma with exacerbation Secondary to #1. Steroid burst, continue with inhaler. Continue with pulse ox checks and precautions given.      Return if symptoms worsen or fail to improve.  Records requested if needed. Time spent with patient: 25 minutes, of which >50% was spent in obtaining information about her symptoms, reviweing her previous labs, evaluations, and treatments, counseling her about her conditions (please see discussed topics above), and developing a plan to further investigate it; she had a number  of questions which I addressed.    Orma Flaming, MD [place]  08/07/2019

## 2019-09-01 ENCOUNTER — Other Ambulatory Visit: Payer: Self-pay | Admitting: Rheumatology

## 2019-09-01 DIAGNOSIS — M0609 Rheumatoid arthritis without rheumatoid factor, multiple sites: Secondary | ICD-10-CM

## 2019-09-01 NOTE — Telephone Encounter (Signed)
Last Visit: 07/31/2019 Next Visit: 10/09/2019 Labs: 07/31/2019 BMP WNL. CBC WNL.  TB Gold: 10/23/2018 negative   Okay to refill per Dr. Estanislado Pandy.

## 2019-09-03 ENCOUNTER — Other Ambulatory Visit: Payer: Self-pay | Admitting: Family Medicine

## 2019-09-12 ENCOUNTER — Encounter: Payer: Self-pay | Admitting: Rheumatology

## 2019-09-22 ENCOUNTER — Encounter: Payer: Self-pay | Admitting: Rheumatology

## 2019-09-22 ENCOUNTER — Other Ambulatory Visit: Payer: Self-pay

## 2019-09-22 ENCOUNTER — Telehealth (INDEPENDENT_AMBULATORY_CARE_PROVIDER_SITE_OTHER): Payer: 59 | Admitting: Rheumatology

## 2019-09-22 DIAGNOSIS — M0609 Rheumatoid arthritis without rheumatoid factor, multiple sites: Secondary | ICD-10-CM

## 2019-09-22 DIAGNOSIS — Z79899 Other long term (current) drug therapy: Secondary | ICD-10-CM

## 2019-09-22 DIAGNOSIS — M19072 Primary osteoarthritis, left ankle and foot: Secondary | ICD-10-CM

## 2019-09-22 DIAGNOSIS — R931 Abnormal findings on diagnostic imaging of heart and coronary circulation: Secondary | ICD-10-CM

## 2019-09-22 DIAGNOSIS — M19071 Primary osteoarthritis, right ankle and foot: Secondary | ICD-10-CM

## 2019-09-22 DIAGNOSIS — M7062 Trochanteric bursitis, left hip: Secondary | ICD-10-CM

## 2019-09-22 DIAGNOSIS — Z8669 Personal history of other diseases of the nervous system and sense organs: Secondary | ICD-10-CM

## 2019-09-22 DIAGNOSIS — Z8719 Personal history of other diseases of the digestive system: Secondary | ICD-10-CM

## 2019-09-22 DIAGNOSIS — M7061 Trochanteric bursitis, right hip: Secondary | ICD-10-CM | POA: Diagnosis not present

## 2019-09-22 DIAGNOSIS — Z8709 Personal history of other diseases of the respiratory system: Secondary | ICD-10-CM

## 2019-09-22 DIAGNOSIS — R7989 Other specified abnormal findings of blood chemistry: Secondary | ICD-10-CM

## 2019-09-22 DIAGNOSIS — Z8639 Personal history of other endocrine, nutritional and metabolic disease: Secondary | ICD-10-CM

## 2019-09-22 NOTE — Progress Notes (Signed)
Virtual Visit via Telephone Note  I connected with [name] on 09/22/19 at 10:15 AM EST by telephone and verified that I am speaking with the correct person using two identifiers.  Location: Patient: Home  Provider: Clinic  This service was conducted via virtual visit.  The patient was located at home. I was located in my office.  Consent was obtained prior to the virtual visit and is aware of possible charges through their insurance for this visit.  The patient is an established patient.  Dr. Estanislado Pandy, MD conducted the virtual visit and Hazel Sams, PA-C acted as scribe during the service.  Office staff helped with scheduling follow up visits after the service was conducted.   I discussed the limitations, risks, security and privacy concerns of performing an evaluation and management service by telephone and the availability of in person appointments. I also discussed with the patient that there may be a patient responsible charge related to this service. The patient expressed understanding and agreed to proceed.  CC: Discuss medications  History of Present Illness: [name] is a 54 y.o. female with history of seronegative rheumatoid arthritis and osteoarthritis.  Patient was started on Humira 40 mg subcutaneous injections every 14 days in October 2020 and is taking plaquenil 200 mg 1 tablet BID. She has had several interruptions in Humira injections due to being diagnosed with covid in November 2020.  Her last injection was on 09/11/19.  She has completely recovered from the covid infection and is asymptomatic at this time. She was treated with prednisone, and she has noticed weight gain and fluid retention. She is experiencing increased blurred vision and is concerned it is related to humira use.  She was recently evaluated at [facility] eye care for her PLQ eye exam.  She continues to have right ankle joint pain and inflammation.  She has pain when descending stairs.  She has been exercising  on a daily basis.  She has right wrist joint pain and swelling.   Review of Systems  Constitutional: Positive for malaise/fatigue. Negative for fever.  HENT: Negative for congestion.   Eyes: Positive for blurred vision and redness. Negative for photophobia, pain and discharge.  Respiratory: Negative for cough and wheezing.   Cardiovascular: Negative for chest pain and palpitations.  Gastrointestinal: Negative for blood in stool, constipation and diarrhea.  Genitourinary: Negative for dysuria and frequency.  Musculoskeletal: Positive for joint pain. Negative for back pain, myalgias and neck pain.  Skin: Negative for rash.  Neurological: Negative for dizziness and headaches.  Psychiatric/Behavioral: Negative for depression and memory loss. The patient is not nervous/anxious and does not have insomnia.      Observations/Objective: Physical Exam  Constitutional: She is oriented to person, place, and time.  Neurological: She is alert and oriented to person, place, and time.  Psychiatric: Mood, memory, affect and judgment normal.   Patient reports joint stiffness all day  Patient denies nocturnal pain.  Difficulty dressing/grooming: Denies Difficulty climbing stairs: Reports Difficulty getting out of chair: Denies Difficulty using hands for taps, buttons, cutlery, and/or writing: Denies   Assessment and Plan: Visit Diagnoses: Rheumatoid arthritis of multiple sites with negative rheumatoid factor ([place]) - Diagnosed at [facility]. The ultrasound examination performed showed synovitis in bilateral hands: She has ongoing tenderness and inflammation in the right wrist joint and right ankle joint.  She was started on Humira in October 2020 but has had several interruptions due to being diagnosed with Covid in November 2020.  She  has fully recovered from covid and is no longer taking prednisone.  Her most recent Humira injection was on 09/11/19.  She continues to take  plaquenil 200 mg 1 tablet BID.   She has been experiencing increased blurred vision and is concerned it is related to the use of Humira.  We reassured her it is unlikely that it is related to use of Humira, but we did discuss that RA arthritis can be associated with uveitis.  She was advised to follow up at [facility] for further evaluation.  She will continue on Humira and PLQ as prescribed.  She will follow up in 6-8 weeks and if she continues to have ongoing joint inflammation we will discuss other treatment options at that visit.    High risk medication use -Humira 40 mg every 14 days started in October 2020 and Plaquenil 200 mg 1 tablet twice daily.   She had an inadequate response to Enbrel in the past.  D/c MTX due to elevated LFTs. Last TB gold negative on 10/23/2018 and will monitor yearly.  Last Plaquenil eye exam normal on 04/09/2019. CBC and BMP updated on 07/31/19.     Primary osteoarthritis of both feet: She has ongoing pain and inflammation in the right ankle joint.   Trochanteric bursitis of both hips: She has intermittent discomfort.  She exercises on a daily basis.  Other medical conditions are listed as follows:   Elevated LFTs: LFTs WNL on 07/28/19   Decreased cardiac ejection fraction  History of asthma  History of gastroesophageal reflux (GERD)  History of hypothyroidism  Hx of migraines   Follow Up Instructions: She will follow up in 6-8 weeks   I discussed the assessment and treatment plan with the patient. The patient was provided an opportunity to ask questions and all were answered. The patient agreed with the plan and demonstrated an understanding of the instructions.   The patient was advised to call back or seek an in-person evaluation if the symptoms worsen or if the condition fails to improve as anticipated.  I provided 25 minutes of non-face-to-face time during this encounter.  Bo Merino, MD   Scribed by-  Hazel Sams PA-C

## 2019-10-07 ENCOUNTER — Other Ambulatory Visit: Payer: Self-pay | Admitting: Rheumatology

## 2019-10-07 DIAGNOSIS — M1230 Palindromic rheumatism, unspecified site: Secondary | ICD-10-CM

## 2019-10-07 NOTE — Telephone Encounter (Signed)
Last Visit: 07/31/2019 Next Visit: 11/10/2019 Labs: 07/31/2019 BMP WNL. CBC WNL.  PLQ Eye Exam: 04/09/19 WNL   Okay to refill per Dr. Estanislado Pandy

## 2019-10-09 ENCOUNTER — Ambulatory Visit: Payer: BC Managed Care – PPO | Admitting: Rheumatology

## 2019-10-10 ENCOUNTER — Encounter: Payer: Self-pay | Admitting: Rheumatology

## 2019-10-13 ENCOUNTER — Telehealth (INDEPENDENT_AMBULATORY_CARE_PROVIDER_SITE_OTHER): Payer: 59 | Admitting: Rheumatology

## 2019-10-13 ENCOUNTER — Other Ambulatory Visit: Payer: Self-pay | Admitting: *Deleted

## 2019-10-13 ENCOUNTER — Telehealth: Payer: Self-pay | Admitting: Family Medicine

## 2019-10-13 ENCOUNTER — Telehealth: Payer: Self-pay | Admitting: *Deleted

## 2019-10-13 ENCOUNTER — Other Ambulatory Visit: Payer: Self-pay

## 2019-10-13 ENCOUNTER — Encounter: Payer: Self-pay | Admitting: Rheumatology

## 2019-10-13 DIAGNOSIS — M0609 Rheumatoid arthritis without rheumatoid factor, multiple sites: Secondary | ICD-10-CM | POA: Diagnosis not present

## 2019-10-13 DIAGNOSIS — R931 Abnormal findings on diagnostic imaging of heart and coronary circulation: Secondary | ICD-10-CM

## 2019-10-13 DIAGNOSIS — Z8669 Personal history of other diseases of the nervous system and sense organs: Secondary | ICD-10-CM

## 2019-10-13 DIAGNOSIS — M7061 Trochanteric bursitis, right hip: Secondary | ICD-10-CM

## 2019-10-13 DIAGNOSIS — Z79899 Other long term (current) drug therapy: Secondary | ICD-10-CM

## 2019-10-13 DIAGNOSIS — M7062 Trochanteric bursitis, left hip: Secondary | ICD-10-CM

## 2019-10-13 DIAGNOSIS — M1230 Palindromic rheumatism, unspecified site: Secondary | ICD-10-CM

## 2019-10-13 DIAGNOSIS — Z8709 Personal history of other diseases of the respiratory system: Secondary | ICD-10-CM

## 2019-10-13 DIAGNOSIS — M19072 Primary osteoarthritis, left ankle and foot: Secondary | ICD-10-CM

## 2019-10-13 DIAGNOSIS — M19071 Primary osteoarthritis, right ankle and foot: Secondary | ICD-10-CM

## 2019-10-13 DIAGNOSIS — Z8639 Personal history of other endocrine, nutritional and metabolic disease: Secondary | ICD-10-CM

## 2019-10-13 DIAGNOSIS — R7989 Other specified abnormal findings of blood chemistry: Secondary | ICD-10-CM

## 2019-10-13 DIAGNOSIS — Z8719 Personal history of other diseases of the digestive system: Secondary | ICD-10-CM

## 2019-10-13 MED ORDER — HYDROXYCHLOROQUINE SULFATE 200 MG PO TABS: 200.0000 mg | ORAL_TABLET | Freq: Two times a day (BID) | ORAL | 0 refills | Status: DC

## 2019-10-13 NOTE — Telephone Encounter (Signed)
Schedule  a visit to discuss tt options.

## 2019-10-13 NOTE — Telephone Encounter (Signed)
Patient will come to office 10/14/2019 to discuss Orencia hand and consent with you. Thank you.

## 2019-10-13 NOTE — Telephone Encounter (Signed)
Noted and agree. 

## 2019-10-13 NOTE — Telephone Encounter (Signed)
Just want to have some one to review this. Patient states that she was able to push fluids and did not go to ED. She is feeling better today. Has been seen by rheumatology and they are going to make change in meds. Nothing needed from our office.

## 2019-10-13 NOTE — Progress Notes (Signed)
Virtual Visit via Telephone Note  I connected with [name] on 10/13/19 at 11:45 AM EST by telephone and verified that I am speaking with the correct person using two identifiers.  Location: Patient: Home Provider: Clinic  This service was conducted via virtual visit.  The patient was located at home. I was located in my office.  Consent was obtained prior to the virtual visit and is aware of possible charges through their insurance for this visit.  The patient is an established patient.  Dr. Estanislado Pandy, MD conducted the virtual visit and Hazel Sams, PA-C acted as scribe during the service.  Office staff helped with scheduling follow up visits after the service was conducted.   I discussed the limitations, risks, security and privacy concerns of performing an evaluation and management service by telephone and the availability of in person appointments. I also discussed with the patient that there may be a patient responsible charge related to this service. The patient expressed understanding and agreed to proceed.  CC: Right ankle joint pain  History of Present Illness: Patient is a 54 year old female with a past medical history of seronegative rheumatoid arthritis and osteoarthritis.  She is on Humira 40 mg sq injections every 14 days and plaquenil 200 mg 1 tablet by mouth twice daily.  She reports she restarted on Humira 2 weeks after being diagnosed with covid-19 on 07/31/19.  She has had 3 injections since having covid. She states after the past 2 injections she has had a bout of diarrhea and vomiting for about 24 hours. She denies any fever during that time. She states her most recent injection was 4 days ago, and she had 20 episodes of diarrhea during a 24 hour period.  She states she tolerates PLQ and takes it at bedtime. She previously had an inadequate response to Enbrel.  She states she continues to have pain and inflammation in the right ankle joint and bilateral trochanteric bursitis.   She experiences nocturnal pain related to trochanteric bursitis bilaterally.   Review of Systems  Constitutional: Positive for malaise/fatigue. Negative for fever.  Eyes: Negative for photophobia, pain, discharge and redness.  Respiratory: Negative for cough, shortness of breath and wheezing.   Cardiovascular: Negative for chest pain and palpitations.  Gastrointestinal: Negative for blood in stool, constipation and diarrhea.  Genitourinary: Negative for dysuria.  Musculoskeletal: Positive for joint pain. Negative for back pain, myalgias and neck pain.       +Morning stiffness  +Joint swelling  Skin: Negative for rash.  Neurological: Negative for dizziness and headaches.  Psychiatric/Behavioral: Negative for depression. The patient is not nervous/anxious and does not have insomnia.       Observations/Objective: Physical Exam  Constitutional: She is oriented to person, place, and time.  Neurological: She is alert and oriented to person, place, and time.  Psychiatric: Mood, memory, affect and judgment normal.   Patient reports morning stiffness for 1 hour.   Patient reports nocturnal pain.  Difficulty dressing/grooming: Denies Difficulty climbing stairs: Reports  Difficulty getting out of chair: Denies Difficulty using hands for taps, buttons, cutlery, and/or writing: Denies    Assessment and Plan: Visit Diagnoses:Rheumatoid arthritis of multiple sites with negative rheumatoid factor ([place])- Diagnosed at [facility]. The ultrasound examination performed showed synovitis in bilateral hands: She has persistent tenderness, morning stiffness, and joint inflammation of the right ankle joint.  She has not noticed any improvement since starting on combination therapy of PLQ 200 mg BID and Humira 40  mg sq injections once every 14 days.  She reports after the past 2 humira injections she experienced diarrhea (up to 20 times in 24 hours) and vomiting.  She was  afebrile during this episodes.  Her most recent humira injection was 4 days ago on 10/09/19.  She will discontinue Humira and we will apply for Orencia 125 mg sq injections once weekly.  Indications, contraindications, and potential side effects of Orencia were discussed. She will come by the office tomorrow to signs the consent form and update lab work.  She will continue taking plaquenil as prescribed.  She will be scheduled for a nurse visit 2 weeks after her last Humira injection for the adminstration of the first Orencia injection.  She will follow up in 6 weeks.   Medication counseling:  TB Gold: negative 10/23/18  Hepatitis panel: Negative on 10/23/18  HIV: negative on 10/23/18 SPEP: WNL on 09/27/18  Immunoglobulins: WNL on 10/23/18 Does patient have a diagnosis of COPD? No Counseled patient that Maureen Chatters is a selective T-cell costimulation blocker indicated for rheumatoid arthritis.  Counseled patient on purpose, proper use, and adverse effects of Orencia. The most common adverse effects are increased risk of infections, headache, and infusion reactions.  There is the possibility of an increased risk of malignancy but it is not well understood if this increased risk is due to the medication or the disease state.  Reviewed the importance of regular labs while on Orencia therapy.  Counseled patient that Maureen Chatters should be held prior to scheduled surgery.  Counseled patient to avoid live vaccines while on Orencia.  Advised patient to get annual influenza vaccine and the pneumococcal vaccine as indicated.  Provided patient with medication education material and answered all questions.  Patient consented to [facility].  Will upload consent into patient's chart.  Will submit benefit's investigation for Orencia.  High risk medication use -We will apply for Orencia 125 mg sq injections once weekly.  She will continue taking Plaquenil 200 mg 1 tablet twice daily.   D/c Humira due to diarrhea/vomiting (started in October  2020).    She had an inadequate response to Enbrel in the past.D/c MTX due to elevated LFTs. Last TB gold negative on 10/23/2018 and will monitor yearly. Future order placed today.Last Plaquenil eye exam normal on 04/09/2019. CBC and BMP updated on 07/31/19. She is due to update lab work in February.   Primary osteoarthritis of both feet: She has ongoing pain and inflammation in the right ankle joint. She experiences stiffness and nocturnal pain in the right ankle joint.   Trochanteric bursitis of both hips: She has persistent trochanteric bursitis bilaterally.  She experiences nocturnal pain.  She was encouraged to perform stretching exercises daily.   Other medical conditions are listed as follows:  Elevated LFTs: LFTs WNL on 07/28/19  Decreased cardiac ejection fraction  History of asthma  History of gastroesophageal reflux (GERD)  History of hypothyroidism  Hx of migraines  Follow Up Instructions: She will follow up in 6 weeks.    I discussed the assessment and treatment plan with the patient. The patient was provided an opportunity to ask questions and all were answered. The patient agreed with the plan and demonstrated an understanding of the instructions.   The patient was advised to call back or seek an in-person evaluation if the symptoms worsen or if the condition fails to improve as anticipated.  I provided 25 minutes of non-face-to-face time during this encounter.   Bo Merino, MD   Scribed  byHazel Sams, PA-C

## 2019-10-13 NOTE — Telephone Encounter (Signed)
[place] [facility] [place] RECORD Access[facility]urse Patient Name: [name] Gender: Female DOB: 15-Jun-1966 Age: 54 Y 37 M 14 D Return Phone Number: [phone] (Primary) Address: City/State/Zip: [place] [address] Client [place] [facility] [place] Client Site [place] [facility] [place] Night Physician Orma Flaming- MD Contact Type Call Who Is Calling Patient / Member / Family / Caregiver Call Type Triage / Clinical Relationship To Patient Self Return Phone Number [phone] (Primary) Chief Complaint Diarrhea Reason for Call Symptomatic / Request for [place] states that she would like medication called in for nausea or diarrhea. She is on Humara and thinks she may be getting dehydrated, she has been able to urinate in the last 8 hours. [place] Not Listed [facility] Translation No Nurse Assessment Nurse: Hassell Done, RN, Mateo Flow Date/Time (Eastern Time): 10/11/2019 11:29:44 AM Confirm and document reason for call. If symptomatic, describe symptoms. ---Caller states that she started having diarrhea yesterday afternoon (20 bouts in 24 hours, and the vomiting started last night (5 to 6 times). She has urinated recently. She had a humira injection Thursday, but she doesn't know if it is related. She had covid in November. She doesn't have a fever, but is having abdominal cramps. Has the patient had close contact with a person known or suspected to have the novel coronavirus illness OR traveled / lives in area with major community spread (including international travel) in the last 14 days from the onset of symptoms? * If Asymptomatic, screen for exposure and travel within the last 14 days. ---No Does the patient have any new or worsening symptoms? ---Yes Will a triage be completed? ---Yes Related visit to physician within the last 2 weeks? ---No Does the PT  have any chronic conditions? (i.e. diabetes, asthma, this includes High risk factors for pregnancy, etc.) ---Yes List chronic conditions. ---asthma, rheumatoid arthritis, thyroid issues. Is the patient pregnant or possibly pregnant? (Ask all females between the ages of 21-55) ---No Is this a behavioral health or substance abuse call? ---No PLEASE NOTE: All timestamps contained within this report are represented as [place] Standard Time. CONFIDENTIALTY NOTICE: This fax transmission is intended only for the addressee. It contains information that is legally privileged, confidential or otherwise protected from use or disclosure. If you are not the intended recipient, you are strictly prohibited from reviewing, disclosing, copying using or disseminating any of this information or taking any action in reliance on or regarding this information. If you have received this fax in error, please notify us immediately by telephone so that we can arrange for its return to [place]. Phone: [phone], Toll-Free: [phone], Fax: [phone] Page: 2 of 2 Call Id: [id number] Guidelines Guideline Title Affirmed Question Affirmed Notes Nurse Date/Time Eilene Ghazi Time) Diarrhea [1] Drinking very little AND [2] dehydration suspected (e.g., no urine > 12 hours, very dry mouth, very lightheaded) Hassell Done, RN, Mateo Flow 10/11/2019 11:32:41 AM Disp. Time Eilene Ghazi Time) Disposition Final User 10/11/2019 11:35:02 AM Go to ED Now (or PCP triage) Yes Hassell Done, RN, Monika Salk Disagree/Comply Comply Caller Understands Yes PreDisposition Did not know what to do Care Advice Given Per Guideline GO TO ED NOW (OR PCP TRIAGE): * IF NO PCP (PRIMARY CARE PROVIDER) SECOND-LEVEL TRIAGE: You need to be seen within the next hour. Go to the [place] at _____________ [place] as soon as you can. ANOTHER ADULT SHOULD DRIVE: * It is better and safer if another adult drives instead  of you. CARE ADVICE given per Diarrhea (Adult)  guideline. Referrals GO TO FACILITY OTHER - SPECIFY

## 2019-10-13 NOTE — Telephone Encounter (Signed)
Spoke with patient and scheduled virtual visit for 10/13/19.

## 2019-10-14 ENCOUNTER — Other Ambulatory Visit: Payer: Self-pay | Admitting: *Deleted

## 2019-10-14 ENCOUNTER — Telehealth: Payer: Self-pay | Admitting: Pharmacist

## 2019-10-14 DIAGNOSIS — Z79899 Other long term (current) drug therapy: Secondary | ICD-10-CM

## 2019-10-14 NOTE — Addendum Note (Signed)
Addended by: Mariella Saa C on: 10/14/2019 02:34 PM   Modules accepted: Orders

## 2019-10-14 NOTE — Telephone Encounter (Signed)
Please start BIV for Orencia for Rheumatoid arthritis.  She failed monotherapy Plaquenil, MTX caused elevated LFT's, inadequate response to Enbrel, and Humira caused severe N/V.   Mariella Saa, PharmD, [place], [place] Clinical Specialty Pharmacist [phone]  10/14/2019 2:35 PM

## 2019-10-14 NOTE — Patient Instructions (Addendum)
We will schedule an appointment for first injection pending labs and insurance approval.  You will need a follow up appointment 1 month after starting Orencia.  Standing Labs We placed an order today for your standing lab work.    Please come back and get your standing labs after starting Orencia in 1 month and then every 3 months.  We have open lab daily Monday through Thursday from 8:30-12:30 PM and 1:30-4:30 PM and Friday from 8:30-12:30 PM and 1:30-4:00 PM at the office of Dr. Bo Merino.   You may experience shorter wait times on Monday and Friday afternoons. The office is located at [address] [place], [place], [place] [address] No appointment is necessary.   Labs are drawn by [facility].  You may receive a bill from [facility] for your lab work.  If you wish to have your labs drawn at another location, please call the office 24 hours in advance to send orders.  If you have any questions regarding directions or hours of operation,  please call [phone].   Just as a reminder please drink plenty of water prior to coming for your lab work. Thanks!  Vaccines You are taking a medication(s) that can suppress your immune system.  The following immunizations are recommended: . Flu annually . Covid-19 . Pneumonia (Pneumovax 23 and Prevnar 13 spaced at least 1 year apart) . Shingrix  Please check with your PCP to make sure you are up to date.  Abatacept solution for injection (subcutaneous or intravenous use) What is this medicine? ABATACEPT (a ba TA sept) is used to treat moderate to severe active rheumatoid arthritis or psoriatic arthritis in adults. This medicine is also used to treat juvenile idiopathic arthritis. This medicine may be used for other purposes; ask your health care provider or pharmacist if you have questions. COMMON BRAND NAME(S): Orencia What should I tell my health care provider before I take this medicine? They need to know if you have any of these  conditions:  cancer  diabetes  hepatitis B or history of hepatitis B infection  immune system problems  infection or history of infection (especially a virus infection such as chickenpox, cold sores, or herpes)  lung or breathing problems, like chronic obstructive pulmonary disease (COPD)  recently received or scheduled to receive a vaccination  scheduled to have surgery  tuberculosis, a positive skin test for tuberculosis, or have recently been in close contact with someone who has tuberculosis  an unusual or allergic reaction to abatacept, other medicines, foods, dyes, or preservatives  pregnant or trying to get pregnant  breast-feeding How should I use this medicine? This medicine is for infusion into a vein or for injection under the skin. Infusions are given by a health care professional in a hospital or clinic setting. If you are to give your own medicine at home, you will be taught how to prepare and give this medicine under the skin. Use exactly as directed. Take your medicine at regular intervals. Do not take your medicine more often than directed. It is important that you put your used needles and syringes in a special sharps container. Do not put them in a trash can. If you do not have a sharps container, call your pharmacist or health care provider to get one. Talk to your pediatrician regarding the use of this medicine in children. While infusions in a clinic may be prescribed for children as young as 2 years for selected conditions, precautions do apply. Overdosage: If you think  you have taken too much of this medicine contact a poison control center or emergency room at once. NOTE: This medicine is only for you. Do not share this medicine with others. What if I miss a dose? This medicine is used once a week if given by injection under the skin. If you miss a dose, take it as soon as you can. If it is almost time for your next dose, take only that dose. Do not take  double or extra doses. If you are to be given an infusion of this medicine, it is important not to miss your dose. Doses are usually every 4 weeks. Call your doctor or health care professional if you are unable to keep an appointment. What may interact with this medicine? Do not take this medicine with any of the following medications:  live vaccines This medicine may also interact with the following medications:  anakinra  baricitinib  canakinumab  medicines that lower your chance of fighting an infection  rituximab  TNF blockers such as adalimumab, certolizumab, etanercept, golimumab, infliximab  tocilizumab  tofacitinib  upadacitinib  ustekinumab This list may not describe all possible interactions. Give your health care provider a list of all the medicines, herbs, non-prescription drugs, or dietary supplements you use. Also tell them if you smoke, drink alcohol, or use illegal drugs. Some items may interact with your medicine. What should I watch for while using this medicine? Visit your doctor for regular checks on your progress. Tell your doctor or health care professional if your symptoms do not start to get better or if they get worse. You will be tested for tuberculosis (TB) before you start this medicine. If your doctor prescribed any medicine for TB, you should start taking the TB medicine before starting this medicine. Make sure to finish the full course of TB medicine. This medicine may increase your risk of getting an infection. Call your doctor or health care professional if you get fever, chills, or sore throat, or other symptoms of a cold or flu. Do not treat yourself. Try to avoid being around people who are sick. If you have diabetes and are getting this medicine in a vein, the infusion can give false high blood sugar readings on the day of your dose. This may happen if you use certain types of blood glucose tests. Your health care provider may tell you to use a  different way to monitor your blood sugar levels. What side effects may I notice from receiving this medicine? Side effects that you should report to your doctor or health care professional as soon as possible:  allergic reactions like skin rash, itching or hives, swelling of the face, lips, or tongue  breathing problems  chest pain  dizziness  signs and symptoms of infection like fever; chills; cough; sore throat; pain or trouble passing urine  unusually weak or tired Side effects that usually do not require medical attention (report to your doctor or health care professional if they continue or are bothersome):  diarrhea  headache  nausea  pain, redness, or irritation at site where injected  stomach pain or upset This list may not describe all possible side effects. Call your doctor for medical advice about side effects. You may report side effects to FDA at 1-800-FDA-1088. Where should I keep my medicine? Infusions will be given in a hospital or clinic and will not be stored at home. Storage for syringes and autoinjectors stored at home: Keep out of the reach of children.  Store in a refrigerator between 2 and 8 degrees C (36 and 46 degrees F). Keep this medicine in the original container. Protect from light. Do not freeze. Do not shake. Throw away any unused medicine after the expiration date. NOTE: This sheet is a summary. It may not cover all possible information. If you have questions about this medicine, talk to your doctor, pharmacist, or health care provider.  2020 Elsevier/Gold Standard (2019-03-11 14:01:21)

## 2019-10-14 NOTE — Progress Notes (Signed)
Pharmacy Note  Subjective: Patient presents today to [facility] for follow up office visit.   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  Prior therapy includes: Humira (diarrhea/vomiting) Enbrel in the past.D/c MTX due to elevated LFTs. and Plaquenil monotherapy with inadequate response.  Objective: CBC    Component Value Date/Time   WBC 5.2 07/31/2019 1114   RBC 4.64 07/31/2019 1114   HGB 13.4 07/31/2019 1114   HCT 39.9 07/31/2019 1114   PLT 239 07/31/2019 1114   MCV 86.0 07/31/2019 1114   MCH 28.9 07/31/2019 1114   MCHC 33.6 07/31/2019 1114   RDW 12.7 07/31/2019 1114   LYMPHSABS 967 07/31/2019 1114   MONOABS 0.6 03/16/2019 1510   EOSABS 68 07/31/2019 1114   BASOSABS 57 07/31/2019 1114    CMP     Component Value Date/Time   NA 138 07/31/2019 1114   NA 137 07/25/2016 0000   K 4.5 07/31/2019 1114   CL 103 07/31/2019 1114   CO2 28 07/31/2019 1114   GLUCOSE 80 07/31/2019 1114   BUN 12 07/31/2019 1114   CREATININE 0.76 07/31/2019 1114   CALCIUM 9.7 07/31/2019 1114   PROT 6.5 07/28/2019 0847   ALBUMIN 4.5 07/28/2019 0847   AST 34 07/28/2019 0847   ALT 30 07/28/2019 0847   ALKPHOS 64 07/28/2019 0847   BILITOT 0.6 07/28/2019 0847   GFRNONAA 99 06/24/2019 0953   GFRAA 114 06/24/2019 0953    Baseline Immunosuppressant Therapy Labs TB GOLD Quantiferon TB Gold Latest Ref Rng & Units 10/23/2018  Quantiferon TB Gold Plus NEGATIVE NEGATIVE   Hepatitis Panel Hepatitis Latest Ref Rng & Units 10/23/2018  Hep B Surface Ag NON-REACTI NON-REACTIVE  Hep B IgM NON-REACTI NON-REACTIVE  Hep C Ab NON-REACTI NON-REACTIVE  Hep C Ab NON-REACTI NON-REACTIVE  Hep A IgM NON-REACTI NON-REACTIVE   HIV Lab Results  Component Value Date   HIV NON-REACTIVE 10/23/2018   Immunoglobulins Immunoglobulin Electrophoresis Latest Ref Rng & Units 10/23/2018  IgA  47 - 310 mg/dL 195  IgG 600 - 1,640 mg/dL 890  IgM 50 - 300 mg/dL 115    SPEP Serum Protein Electrophoresis Latest Ref Rng & Units 07/28/2019  Total Protein 6.0 - 8.5 g/dL 6.5  Albumin 3.8 - 4.8 g/dL -  Alpha-1 0.2 - 0.3 g/dL -  Alpha-2 0.5 - 0.9 g/dL -  Beta Globulin 0.4 - 0.6 g/dL -  Beta 2 0.2 - 0.5 g/dL -  Gamma Globulin 0.8 - 1.7 g/dL -   G6PD Lab Results  Component Value Date   G6PDH 16.2 09/27/2018   TPMT No results found for: TPMT   Chest x-ray: No edema or consolidation 10/23/2018   Does patient have a diagnosis of COPD? No  Does patient have history of diverticulitis?  No  Assessment/Plan:  Counseled patient that [name] is a selective T-cell costimulation blocker.  Counseled patient on purpose, proper use, and adverse effects of Orencia. The most common adverse effects are increased risk of infections, headache, and injection site reactions.  There is the possibility of an increased risk of malignancy but it is not well understood if this increased risk is due to the medication or the disease state. Reviewed risk of GI perforation which is higher in patients with diverticulitis and diabetes.  Counseled patient that [name] should be held prior to scheduled surgery.  Counseled patient to avoid live vaccines while on Orencia.  Recommend annual influenza, Pneumovax 23, Prevnar 13, and Shingrix as indicated.  Reviewed the importance of regular labs while on Orencia therapy. Patient will be due for labs 1 month after starting therapy. Standing orders placed. Provided patient with medication education material and answered all questions.  Patient consented to [facility].  Will upload consent into patient's chart.  Will apply for Orencia through patient's insurance.  Reviewed storage information for Orencia.  Advised initial injection must be administered in office.    Patient dose will be Orencia 125 mg every 7 days.  Prescription pending lab results and/or insurance approval.  All question encouraged and answered.  Instructed patient to call with any  other questions or concerns.   Mariella Saa, PharmD, [place], [place] [facility] Pharmacist [phone]  10/14/2019 2:30 PM

## 2019-10-14 NOTE — Telephone Encounter (Signed)
Opened in error

## 2019-10-15 NOTE — Telephone Encounter (Signed)
Patient scheduled for a new start appointment on 10/23/19 at 10 am. Sample placed aside for patient's appointment. Patient currently on Humira and states her next injection would have been due 10/23/19.

## 2019-10-15 NOTE — Telephone Encounter (Signed)
Received notification from [facility] regarding a prior authorization for [facility]. Authorization has been APPROVED from 10/15/19 to 10/14/20.   Will send document to scan center.  Authorization # [id number]   Per plan, patient must fill through [facility]. Patient has Pharmacist, community, so she can use a copay card.  2:15 PM Beatriz Chancellor, CPhT

## 2019-10-15 NOTE — Telephone Encounter (Signed)
Submitted a Prior Authorization request to [facility] for [facility] via Cover My Meds. Will update once we receive a response.  (Key: [id number]  Beatriz Chancellor, CPhT 8:00 AM

## 2019-10-16 ENCOUNTER — Ambulatory Visit: Payer: 59 | Admitting: Obstetrics and Gynecology

## 2019-10-16 ENCOUNTER — Other Ambulatory Visit (HOSPITAL_COMMUNITY)
Admission: RE | Admit: 2019-10-16 | Discharge: 2019-10-16 | Disposition: A | Discharge status: Home / Self Care | Payer: 59 | Source: Ambulatory Visit | Attending: Obstetrics and Gynecology | Admitting: Obstetrics and Gynecology

## 2019-10-16 ENCOUNTER — Other Ambulatory Visit: Payer: Self-pay | Admitting: Obstetrics and Gynecology

## 2019-10-16 ENCOUNTER — Other Ambulatory Visit: Payer: Self-pay

## 2019-10-16 ENCOUNTER — Encounter: Payer: Self-pay | Admitting: Obstetrics and Gynecology

## 2019-10-16 VITALS — BP 126/80 | HR 80 | Temp 97.3°F | Resp 16 | Ht 65.25 in | Wt 179.0 lb

## 2019-10-16 DIAGNOSIS — Z01419 Encounter for gynecological examination (general) (routine) without abnormal findings: Secondary | ICD-10-CM

## 2019-10-16 DIAGNOSIS — N8110 Cystocele, unspecified: Secondary | ICD-10-CM | POA: Diagnosis not present

## 2019-10-16 DIAGNOSIS — N393 Stress incontinence (female) (male): Secondary | ICD-10-CM

## 2019-10-16 DIAGNOSIS — Z803 Family history of malignant neoplasm of breast: Secondary | ICD-10-CM | POA: Diagnosis not present

## 2019-10-16 DIAGNOSIS — Z1231 Encounter for screening mammogram for malignant neoplasm of breast: Secondary | ICD-10-CM

## 2019-10-16 DIAGNOSIS — N816 Rectocele: Secondary | ICD-10-CM

## 2019-10-16 NOTE — Progress Notes (Signed)
54 y.o. [id number] Married Caucasian female here for annual exam.    Patient complaining of vaginal prolapse. This has gotten worse since she had COVID in 07/2019 and had a lot of coughing. Patient has had urodynamics testing in years past. She has urinary incontinence if she is jumping around or exercising.  She is voiding well.  She is having some constipation symptoms.  She has done splinting.  Denies fecal incontinence. She does feel a vaginal bulge.  Is sexually active and denies pain with intercourse.  Patient is asking for surgical assistance for her prolapse and incontinence.   She had a hysterectomy for fibroids and endometriosis "everywhere" per patient.  She reports she had endometriosis on her bladder as well.  Denies blood in her urine.   She states she developed a hernia following her hysterectomy and she had a mesh placed.  She report scars of her lateral abdominal wall used for anchoring the mesh?  She is taking gabapentin 600 mg at hs for menopausal symptoms with hot flashes.  Thinks it works sometimes yes and no.  She feels like her hormones really changes this last summer.  She reports long standing vaginal dryness is more of a concern to her.  Replens caused a lot of discharge.   3 children.  She studied sociology and psychology.  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    PCP: Orma Flaming, MD   No LMP recorded (lmp unknown). Patient has had a hysterectomy.           Sexually active: Yes.    The current method of family planning is status post hysterectomy--ovaries remain.    Exercising: Yes.    [place], weights, boxing and walking Smoker:  no  Health Maintenance: Pap: 2019 normal per patient History of abnormal Pap:  no MMG:  2019 normal per patient.   Colonoscopy: 07-11-18 Neg cologuard BMD:   n/a  Result  n/a TDaP:  06-28-18 Gardasil:   no HIV:10-23-18 NR Hep C: 10-23-18 Neg Screening Labs:  PCP.  Flu  vaccine:  Completed.  Shingrix:  Completed   reports that she has never smoked. She has never used smokeless tobacco. She reports current alcohol use. She reports that she does not use drugs.  Past Medical History:  Diagnosis Date  . Arthritis   . Asthma   . Fibroid   . Frequent headaches   . GERD (gastroesophageal reflux disease)   . Gluten intolerance   . H/O seasonal allergies   . History of chicken pox   . History of COVID-19 07/2019  . Left bundle branch block 02/2019   Dr.Paula Ross  . Migraines   . Thyroid disease    Multiple benign nodules--thyroid removed    Past Surgical History:  Procedure Laterality Date  . CARDIAC CATHETERIZATION    . CHOLECYSTECTOMY  1995  . [place]  2009  . HERNIA REPAIR  2017  . LAPAROSCOPIC HYSTERECTOMY  2006  . THYROID SURGERY      Current Outpatient Medications  Medication Sig Dispense Refill  . ADVAIR DISKUS 250-50 MCG/DOSE AEPB TAKE 1 PUFF BY MOUTH TWICE A DAY 180 each 3  . albuterol (VENTOLIN HFA) 108 (90 Base) MCG/ACT inhaler TAKE 2 PUFFS BY MOUTH EVERY 6 HOURS AS NEEDED FOR WHEEZE OR SHORTNESS OF BREATH 25.5 g 5  . ALLEGRA-D ALLERGY & CONGESTION 180-240 MG 24 hr tablet Take 1 tablet by mouth daily. 90 tablet 3  . cholecalciferol (  VITAMIN D) 400 units TABS tablet Take 5,000 Units by mouth.     . clobetasol cream (TEMOVATE) 0.05 % as needed.     . Diclofenac Sodium ([name]) 2 % SOLN Place 1 application onto the skin 2 (two) times daily. 112 g 2  . gabapentin (NEURONTIN) 300 MG capsule TAKE 1 CAPSULE BY MOUTH EVERYDAY AT BEDTIME 90 capsule 3  . hydroxychloroquine (PLAQUENIL) 200 MG tablet Take 1 tablet (200 mg total) by mouth 2 (two) times daily. 180 tablet 0  . levothyroxine (SYNTHROID) 175 MCG tablet TAKE 1 TABLET BY MOUTH DAILY BEFORE BREAKFASt 90 tablet 2  . levothyroxine (SYNTHROID, LEVOTHROID) 150 MCG tablet One tablet by mouth every other day 45 tablet 3  . montelukast (SINGULAIR) 10 MG tablet TAKE 1 TABLET BY  MOUTH EVERYDAY AT BEDTIME 90 tablet 3  . rosuvastatin (CRESTOR) 5 MG tablet Take 0.5 tablets (2.5 mg total) by mouth daily. 90 tablet 3   No current facility-administered medications for this visit.    Family History  Problem Relation Age of Onset  . Arthritis Mother   . Cancer Mother 17       breast ca--dec age 63  . Miscarriages / [place] Mother   . Breast cancer Mother   . Diabetes Father   . Heart attack Father   . Heart disease Father   . Hyperlipidemia Father   . Hypertension Father   . Stroke Father        x3  . COPD Sister   . Peripheral Artery Disease Sister   . Asthma Brother   . Birth defects Brother   . Cancer Brother        skin  . Hyperlipidemia Brother   . Heart attack Maternal Grandmother   . Heart disease Maternal Grandmother   . Hyperlipidemia Maternal Grandmother   . Stroke Maternal Grandmother   . Alcohol abuse Paternal Grandmother   . Cancer Paternal Grandmother   . Cancer Paternal Grandfather        breast  . Heart attack Paternal Grandfather   . Breast cancer Paternal Grandfather   . Alcohol abuse Brother   . Early death Brother        suicide  . Mental illness Brother   . Asthma Daughter   . Asthma Son   . Asthma Son   . Breast cancer Maternal Aunt     Review of Systems  Genitourinary:       Incontinence  All other systems reviewed and are negative.   Exam:   BP 126/80   Pulse 80   Temp (!) 97.3 F (36.3 C) (Temporal)   Resp 16   Ht 5' 5.25" (1.657 m)   Wt 179 lb (81.2 kg)   LMP  (LMP Unknown)   BMI 29.56 kg/m     General appearance: alert, cooperative and appears stated age Head: normocephalic, without obvious abnormality, atraumatic Neck: no adenopathy, supple, symmetrical, trachea midline and thyroid normal to inspection and palpation Lungs: clear to auscultation bilaterally Breasts: normal appearance, no masses or tenderness, No nipple retraction or dimpling, No nipple discharge or bleeding, No axillary  adenopathy Heart: regular rate and rhythm Abdomen: soft, non-tender; no masses, no organomegaly Extremities: extremities normal, atraumatic, no cyanosis or edema Skin: skin color, texture, turgor normal. No rashes or lesions Lymph nodes: cervical, supraclavicular, and axillary nodes normal. Neurologic: grossly normal  Pelvic: External genitalia:  no lesions              No abnormal inguinal  nodes palpated.              Urethra:  normal appearing urethra with no masses, tenderness or lesions              Bartholins and Skenes: normal                 Vagina: normal appearing vagina with normal color and discharge, no lesions.  First to second degree cystocele and first to second degree rectocele.                Cervix: no lesions.  Good support.               Pap taken: Yes.   Bimanual Exam:  Uterus:  normal size, contour, position, consistency, mobility, non-tender              Adnexa: no mass, fullness, tenderness              Rectal exam: Yes.  .  Confirms.              Anus:  normal sphincter tone, no lesions  Chaperone was present for exam.  Assessment:   Well woman visit with normal exam. Status post laparoscopic supracervical hysterectomy.  She has an abdominal wall mesh. Menopausal symptoms and atrophy.  On Gabapentin. Mother deceased from breast cancer.  Maternal aunt with breast cancer. Sister tested negative for BRCA.  Paternal grandfather had breast cancer.   Plan: Mammogram screening discussed.  Information of centers for mammography in [place]. She will schedule. Self breast awareness reviewed. Pap and HR HPV as above. Guidelines for Calcium, Vitamin D, regular exercise program including cardiovascular and weight bearing exercise. We discussed her supracervical hysterectomy and need for ongoing cervical cancer screening.  Will get her operative reports from her hysterectomy and her herniorrhaphy.  We discussed prolapse and incontinence and options for a cystocele  and rectocele repair along with an anti-incontinence procedure.  She will return for urodynamic testing.  Procedure explained.  ACOG HOs on prolapse and incontinence and surgery for theses conditions.  We discussed her family history of breast cancer and opportunity for genetic counseling and testing.  Removal of tubes and ovaries at the time of surgery would be indicated if she has a genetic mutation increasing the risk of ovarian cancer.  We reviewed water based lubricants, cooking oils, vit E vaginal suppositories, and vaginal estrogen for atrophy symptoms.  Follow up annually and prn.   After visit summary provided.

## 2019-10-16 NOTE — Progress Notes (Signed)
CBC is normal.  ALT is mildly elevated most likely due to Crestor use.  Please forward labs to her PCP.

## 2019-10-16 NOTE — Patient Instructions (Signed)

## 2019-10-17 ENCOUNTER — Telehealth: Payer: Self-pay | Admitting: Obstetrics and Gynecology

## 2019-10-17 NOTE — Telephone Encounter (Signed)
Patient left a message on the answering machine stating she had information for Dr.Silva regarding her surgical history. She said "I had incisional hernia repair 10/16/2013 in [place], [place] and a Hysterectomy July or August of 2007 in [place], [place]". She said no need to return her call unless you have questions.

## 2019-10-17 NOTE — Telephone Encounter (Signed)
Surgical Hx updated in pts chart.  Encounter closed.

## 2019-10-18 LAB — CBC WITH DIFFERENTIAL/PLATELET
Absolute Monocytes: 429 cells/uL (ref 200–950)
Basophils Absolute: 47 cells/uL (ref 0–200)
Basophils Relative: 0.6 %
Eosinophils Absolute: 359 cells/uL (ref 15–500)
Eosinophils Relative: 4.6 %
HCT: 39.2 % (ref 35.0–45.0)
Hemoglobin: 13.5 g/dL (ref 11.7–15.5)
Lymphs Abs: 2145 cells/uL (ref 850–3900)
MCH: 29.2 pg (ref 27.0–33.0)
MCHC: 34.4 g/dL (ref 32.0–36.0)
MCV: 84.7 fL (ref 80.0–100.0)
MPV: 11.6 fL (ref 7.5–12.5)
Monocytes Relative: 5.5 %
Neutro Abs: 4820 cells/uL (ref 1500–7800)
Neutrophils Relative %: 61.8 %
Platelets: 248 10*3/uL (ref 140–400)
RBC: 4.63 10*6/uL (ref 3.80–5.10)
RDW: 12.6 % (ref 11.0–15.0)
Total Lymphocyte: 27.5 %
WBC: 7.8 10*3/uL (ref 3.8–10.8)

## 2019-10-18 LAB — QUANTIFERON-TB GOLD PLUS
Mitogen-NIL: >10 IU/mL
NIL: 0.06 IU/mL
QuantiFERON-TB Gold Plus: NEGATIVE
TB1-NIL: 0.04 IU/mL
TB2-NIL: 0.03 IU/mL

## 2019-10-18 LAB — COMPLETE METABOLIC PANEL WITH GFR
AG Ratio: 2.1 (calc) (ref 1.0–2.5)
ALT: 47 U/L — ABNORMAL HIGH (ref 6–29)
AST: 35 U/L (ref 10–35)
Albumin: 4.4 g/dL (ref 3.6–5.1)
Alkaline phosphatase (APISO): 54 U/L (ref 37–153)
BUN: 15 mg/dL (ref 7–25)
CO2: 27 mmol/L (ref 20–32)
Calcium: 9.2 mg/dL (ref 8.6–10.4)
Chloride: 104 mmol/L (ref 98–110)
Creat: 0.7 mg/dL (ref 0.50–1.05)
GFR, Est African American: 116 mL/min/{1.73_m2} (ref >=60)
GFR, Est Non African American: 100 mL/min/{1.73_m2} (ref >=60)
Globulin: 2.1 g/dL (calc) (ref 1.9–3.7)
Glucose, Bld: 82 mg/dL (ref 65–99)
Potassium: 3.9 mmol/L (ref 3.5–5.3)
Sodium: 140 mmol/L (ref 135–146)
Total Bilirubin: 0.8 mg/dL (ref 0.2–1.2)
Total Protein: 6.5 g/dL (ref 6.1–8.1)

## 2019-10-20 NOTE — Progress Notes (Signed)
Pharmacy Note  Subjective:   Patient is being initiated on Orencia.  Patient was previously counseled extensively on and consented to initiation of Orencia at that time.  Patient presents to clinic today to receive the first dose of Orencia.     Objective: CMP     Component Value Date/Time   NA 140 10/14/2019 1525   NA 137 07/25/2016 0000   K 3.9 10/14/2019 1525   CL 104 10/14/2019 1525   CO2 27 10/14/2019 1525   GLUCOSE 82 10/14/2019 1525   BUN 15 10/14/2019 1525   CREATININE 0.70 10/14/2019 1525   CALCIUM 9.2 10/14/2019 1525   PROT 6.5 10/14/2019 1525   PROT 6.5 07/28/2019 0847   ALBUMIN 4.5 07/28/2019 0847   AST 35 10/14/2019 1525   ALT 47 (H) 10/14/2019 1525   ALKPHOS 64 07/28/2019 0847   BILITOT 0.8 10/14/2019 1525   BILITOT 0.6 07/28/2019 0847   GFRNONAA 100 10/14/2019 1525   GFRAA 116 10/14/2019 1525    CBC    Component Value Date/Time   WBC 7.8 10/14/2019 1525   RBC 4.63 10/14/2019 1525   HGB 13.5 10/14/2019 1525   HCT 39.2 10/14/2019 1525   PLT 248 10/14/2019 1525   MCV 84.7 10/14/2019 1525   MCH 29.2 10/14/2019 1525   MCHC 34.4 10/14/2019 1525   RDW 12.6 10/14/2019 1525   LYMPHSABS 2,145 10/14/2019 1525   MONOABS 0.6 03/16/2019 1510   EOSABS 359 10/14/2019 1525   BASOSABS 47 10/14/2019 1525    Baseline Immunosuppressant Therapy Labs TB GOLD Quantiferon TB Gold Latest Ref Rng & Units 10/14/2019  Quantiferon TB Gold Plus NEGATIVE NEGATIVE   Hepatitis Panel Hepatitis Latest Ref Rng & Units 10/23/2018  Hep B Surface Ag NON-REACTI NON-REACTIVE  Hep B IgM NON-REACTI NON-REACTIVE  Hep C Ab NON-REACTI NON-REACTIVE  Hep C Ab NON-REACTI NON-REACTIVE  Hep A IgM NON-REACTI NON-REACTIVE   HIV Lab Results  Component Value Date   HIV NON-REACTIVE 10/23/2018   Immunoglobulins Immunoglobulin Electrophoresis Latest Ref Rng & Units 10/23/2018  IgA  47 - 310 mg/dL 195  IgG 600 - 1,640 mg/dL 890  IgM 50 - 300 mg/dL 115   SPEP Serum Protein Electrophoresis  Latest Ref Rng & Units 10/14/2019  Total Protein 6.1 - 8.1 g/dL 6.5  Albumin 3.8 - 4.8 g/dL -  Alpha-1 0.2 - 0.3 g/dL -  Alpha-2 0.5 - 0.9 g/dL -  Beta Globulin 0.4 - 0.6 g/dL -  Beta 2 0.2 - 0.5 g/dL -  Gamma Globulin 0.8 - 1.7 g/dL -   G6PD Lab Results  Component Value Date   G6PDH 16.2 09/27/2018   TPMT No results found for: TPMT   Chest x-ray 10/23/2018 no active cardiopulmonary disease  Patient running a fever or have signs/symptoms of infection? No  Assessment/Plan:  Demonstrated proper injection technique with Orencia demo pen.  Patient able to demonstrate proper injection technique using the teach back method.  Patient self injected in the left anterior thigh with:  Sample Medication: Orencia Clickject Lot: [id number] Expiration: 10/2020  Patient tolerated well.  Observed for 30 mins in office for adverse reaction and none noted  Patient is to return in 1 month for labs.  Standing orders placed. Prescription sent to [facility] per patient's request.  Instructed patient that she is eligible for co-pay card which she can enroll online.  All questions encouraged and answered.  Instructed patient to call with any further questions or concerns.  Mariella Saa, PharmD, [facility]  Clinical Pharmacist  10/20/2019 1:01 PM

## 2019-10-20 NOTE — Progress Notes (Signed)
TB gold negative

## 2019-10-21 ENCOUNTER — Other Ambulatory Visit: Payer: Self-pay

## 2019-10-21 ENCOUNTER — Telehealth: Payer: Self-pay

## 2019-10-21 ENCOUNTER — Telehealth: Payer: Self-pay | Admitting: Family Medicine

## 2019-10-21 MED ORDER — LEVOTHYROXINE SODIUM 150 MCG PO TABS: ORAL_TABLET | ORAL | 3 refills | Status: DC

## 2019-10-21 MED ORDER — LEVOTHYROXINE SODIUM 175 MCG PO TABS: ORAL_TABLET | ORAL | 2 refills | Status: DC

## 2019-10-21 NOTE — Telephone Encounter (Signed)
Script resent with corrections.

## 2019-10-21 NOTE — Telephone Encounter (Signed)
Refill sent in

## 2019-10-21 NOTE — Telephone Encounter (Signed)
[name] from [facility] calling regarding prescription instruction. Rep states the instruction are incorrect.levothyroxine (SYNTHROID) 175 MCG tablet rep states that its suppose to say 1 tablet every other day. Best Contact Number  [phone]

## 2019-10-21 NOTE — Telephone Encounter (Signed)
  LAST APPOINTMENT DATE: 10/13/2019   NEXT APPOINTMENT DATE:@Visit  date not found  MEDICATION:levothyroxine (SYNTHROID) 175 MCG tablet  [facility]  [id number] [address], [place] Phone:  [phone]  Fax:  [phone]       **Let patient know to contact pharmacy at the end of the day to make sure medication is ready. **  ** Please notify patient to allow 48-72 hours to process**  **Encourage patient to contact the pharmacy for refills or they can request refills through [facility]**  CLINICAL FILLS OUT ALL BELOW:   LAST REFILL:  QTY:  REFILL DATE:    OTHER COMMENTS:    Okay for refill?  Please advise

## 2019-10-22 LAB — CYTOLOGY - PAP
Comment: NEGATIVE
Diagnosis: NEGATIVE
High risk HPV: NEGATIVE

## 2019-10-23 ENCOUNTER — Other Ambulatory Visit: Payer: Self-pay

## 2019-10-23 ENCOUNTER — Ambulatory Visit (INDEPENDENT_AMBULATORY_CARE_PROVIDER_SITE_OTHER): Payer: 59 | Admitting: Pharmacist

## 2019-10-23 VITALS — BP 115/71 | HR 76

## 2019-10-23 DIAGNOSIS — M0609 Rheumatoid arthritis without rheumatoid factor, multiple sites: Secondary | ICD-10-CM | POA: Diagnosis not present

## 2019-10-23 MED ORDER — ORENCIA CLICKJECT 125 MG/ML ~~LOC~~ SOAJ: 125.0000 mg | SUBCUTANEOUS | 0 refills | Status: DC

## 2019-10-23 NOTE — Patient Instructions (Signed)
Standing Labs We placed an order today for your standing lab work.    Please come back and get your standing labs in 1 month and then every 3 months.  We have open lab daily Monday through Thursday from 8:30-12:30 PM and 1:30-4:30 PM and Friday from 8:30-12:30 PM and 1:30-4:00 PM at the office of Dr. Bo Merino.   You may experience shorter wait times on Monday and Friday afternoons. The office is located at [address], [place], [place], [place] [address] No appointment is necessary.   Labs are drawn by [facility].  You may receive a bill from [facility] for your lab work.  If you wish to have your labs drawn at another location, please call the office 24 hours in advance to send orders.  If you have any questions regarding directions or hours of operation,  please call [phone].   Just as a reminder please drink plenty of water prior to coming for your lab work. Thanks!  Abatacept solution for injection (subcutaneous or intravenous use) What is this medicine? ABATACEPT (a ba TA sept) is used to treat moderate to severe active rheumatoid arthritis or psoriatic arthritis in adults. This medicine is also used to treat juvenile idiopathic arthritis. This medicine may be used for other purposes; ask your health care provider or pharmacist if you have questions. COMMON BRAND NAME(S): Orencia What should I tell my health care provider before I take this medicine? They need to know if you have any of these conditions:  cancer  diabetes  hepatitis B or history of hepatitis B infection  immune system problems  infection or history of infection (especially a virus infection such as chickenpox, cold sores, or herpes)  lung or breathing problems, like chronic obstructive pulmonary disease (COPD)  recently received or scheduled to receive a vaccination  scheduled to have surgery  tuberculosis, a positive skin test for tuberculosis, or have recently been in close contact with  someone who has tuberculosis  an unusual or allergic reaction to abatacept, other medicines, foods, dyes, or preservatives  pregnant or trying to get pregnant  breast-feeding How should I use this medicine? This medicine is for infusion into a vein or for injection under the skin. Infusions are given by a health care professional in a hospital or clinic setting. If you are to give your own medicine at home, you will be taught how to prepare and give this medicine under the skin. Use exactly as directed. Take your medicine at regular intervals. Do not take your medicine more often than directed. It is important that you put your used needles and syringes in a special sharps container. Do not put them in a trash can. If you do not have a sharps container, call your pharmacist or health care provider to get one. Talk to your pediatrician regarding the use of this medicine in children. While infusions in a clinic may be prescribed for children as young as 2 years for selected conditions, precautions do apply. Overdosage: If you think you have taken too much of this medicine contact a poison control center or emergency room at once. NOTE: This medicine is only for you. Do not share this medicine with others. What if I miss a dose? This medicine is used once a week if given by injection under the skin. If you miss a dose, take it as soon as you can. If it is almost time for your next dose, take only that dose. Do not take double or extra  doses. If you are to be given an infusion of this medicine, it is important not to miss your dose. Doses are usually every 4 weeks. Call your doctor or health care professional if you are unable to keep an appointment. What may interact with this medicine? Do not take this medicine with any of the following medications:  live vaccines This medicine may also interact with the following medications:  anakinra  baricitinib  canakinumab  medicines that lower your  chance of fighting an infection  rituximab  TNF blockers such as adalimumab, certolizumab, etanercept, golimumab, infliximab  tocilizumab  tofacitinib  upadacitinib  ustekinumab This list may not describe all possible interactions. Give your health care provider a list of all the medicines, herbs, non-prescription drugs, or dietary supplements you use. Also tell them if you smoke, drink alcohol, or use illegal drugs. Some items may interact with your medicine. What should I watch for while using this medicine? Visit your doctor for regular checks on your progress. Tell your doctor or health care professional if your symptoms do not start to get better or if they get worse. You will be tested for tuberculosis (TB) before you start this medicine. If your doctor prescribed any medicine for TB, you should start taking the TB medicine before starting this medicine. Make sure to finish the full course of TB medicine. This medicine may increase your risk of getting an infection. Call your doctor or health care professional if you get fever, chills, or sore throat, or other symptoms of a cold or flu. Do not treat yourself. Try to avoid being around people who are sick. If you have diabetes and are getting this medicine in a vein, the infusion can give false high blood sugar readings on the day of your dose. This may happen if you use certain types of blood glucose tests. Your health care provider may tell you to use a different way to monitor your blood sugar levels. What side effects may I notice from receiving this medicine? Side effects that you should report to your doctor or health care professional as soon as possible:  allergic reactions like skin rash, itching or hives, swelling of the face, lips, or tongue  breathing problems  chest pain  dizziness  signs and symptoms of infection like fever; chills; cough; sore throat; pain or trouble passing urine  unusually weak or tired Side  effects that usually do not require medical attention (report to your doctor or health care professional if they continue or are bothersome):  diarrhea  headache  nausea  pain, redness, or irritation at site where injected  stomach pain or upset This list may not describe all possible side effects. Call your doctor for medical advice about side effects. You may report side effects to FDA at 1-800-FDA-1088. Where should I keep my medicine? Infusions will be given in a hospital or clinic and will not be stored at home. Storage for syringes and autoinjectors stored at home: Keep out of the reach of children. Store in a refrigerator between 2 and 8 degrees C (36 and 46 degrees F). Keep this medicine in the original container. Protect from light. Do not freeze. Do not shake. Throw away any unused medicine after the expiration date. NOTE: This sheet is a summary. It may not cover all possible information. If you have questions about this medicine, talk to your doctor, pharmacist, or health care provider.  2020 Elsevier/Gold Standard (2019-03-11 14:01:21)

## 2019-10-30 ENCOUNTER — Encounter: Payer: Self-pay | Admitting: Rheumatology

## 2019-10-30 ENCOUNTER — Telehealth: Payer: Self-pay | Admitting: Pharmacist

## 2019-10-30 DIAGNOSIS — Z79899 Other long term (current) drug therapy: Secondary | ICD-10-CM | POA: Insufficient documentation

## 2019-10-30 NOTE — Telephone Encounter (Signed)
Please start BIV for Rinvoq.  She failed monotherapy Plaquenil, MTX caused elevated LFT's, inadequate response to Enbrel, Humira caused severe N/V, and Orencia caused hives.   Mariella Saa, PharmD, [place], [place] Clinical Specialty Pharmacist [phone]  10/30/2019 2:27 PM

## 2019-10-30 NOTE — Telephone Encounter (Signed)
Please schedule an office visit to discuss starting on Rinvoq once her rash resolves.   Please ask if the patients rash is improving.  If she develops any other side effects like SOB she should be evaluated in the ED.

## 2019-10-30 NOTE — Telephone Encounter (Signed)
I called the patient to discuss the symptoms she is experiencing.  Her first Orencia injection was on 10/23/2019.  She was due for her 2nd Orencia injection today.  She did not administer the injection this morning due to noticing hives started on her torso last night.  The rash has spread to her extremities and neck.  She denies any other new medications.  She denies any new foods or products.  She is unable to identify any other trigger.  She continues to take Plaquenil as prescribed.   She took her daily dose of Allegra this morning and has been taking Benadryl today.  She states that the hives have improved significantly while taking Benadryl.  She declined a prescription for Medrol Dosepak.  She was advised to notify us if she develops any new or worsening symptoms or changes her mind about a Medrol Dosepak.  If she develops shortness of breath or other systemic side effects she was advised to report to the emergency department.  We will schedule an appointment to discuss other treatment options.  She will not start any new medications until the rash has completely resolved.

## 2019-10-30 NOTE — Telephone Encounter (Signed)
Received notification from [facility] regarding a prior authorization for [facility]. Authorization has been APPROVED. Fax with approval dates will be sent to the office.   Authorization # [id number]  Per plan, patient must fill through [facility]. Patient has commercial plan and can use a copay card.  2:57 PM Beatriz Chancellor, CPhT

## 2019-11-03 ENCOUNTER — Ambulatory Visit: Payer: 59 | Admitting: Physician Assistant

## 2019-11-03 NOTE — Telephone Encounter (Signed)
Received notification from [facility] regarding a prior authorization for [facility]. Authorization has been APPROVED from 10/30/19 to 10/29/20.   Will send document to scan center.  Authorization # [id number]  10:25 AM Beatriz Chancellor, CPhT

## 2019-11-10 ENCOUNTER — Ambulatory Visit: Payer: 59 | Admitting: Rheumatology

## 2019-11-10 NOTE — Progress Notes (Signed)
Office Visit Note  Patient: [name]             Date of Birth: 03-Mar-1966           MRN: [id number]             PCP: Orma Flaming, MD Referring: Orma Flaming, MD Visit Date: 11/13/2019 Occupation: @GUAROCC @  Subjective:  Discuss medications   History of Present Illness: [name] is a 54 y.o. female with history of seronegative rheumatoid arthritis and osteoarthritis.  Patient was started on Orencia subcutaneous injections on 10/23/2019.  After her second intervention injection she developed a rash on her torso which spread to her extremities.  She took Allegra-D and Benadryl and the rash resolved within 4 days.  She continues to take Plaquenil 200 mg 1 tablet twice daily.  She has persistent pain and inflammation in the right ankle joint.  She is also been having increased discomfort in both hands, both wrist joints, both knee joints but denies any joint swelling.  She has ongoing left trochanter bursitis and perform stretching exercises on a daily basis.     Activities of Daily Living:  Patient reports joint stiffness all day  Patient Reports nocturnal pain.  Difficulty dressing/grooming: Denies Difficulty climbing stairs: Reports Difficulty getting out of chair: Denies Difficulty using hands for taps, buttons, cutlery, and/or writing: Reports  Review of Systems  Constitutional: Positive for fatigue.  HENT: Positive for mouth dryness. Negative for mouth sores and nose dryness.   Eyes: Positive for dryness. Negative for pain and visual disturbance.  Respiratory: Negative for cough, hemoptysis, shortness of breath and difficulty breathing.   Cardiovascular: Negative for chest pain, palpitations and hypertension.  Gastrointestinal: Negative for blood in stool, constipation and diarrhea.  Endocrine: Negative for increased urination.  Genitourinary: Negative for difficulty urinating and painful urination.  Musculoskeletal: Positive for arthralgias, joint pain, joint  swelling and morning stiffness. Negative for myalgias, muscle weakness, muscle tenderness and myalgias.  Skin: Positive for rash. Negative for color change, pallor, hair loss, nodules/bumps, skin tightness, ulcers and sensitivity to sunlight.  Allergic/Immunologic: Negative for susceptible to infections.  Neurological: Negative for dizziness, headaches and weakness.  Hematological: Negative for swollen glands.  Psychiatric/Behavioral: Positive for sleep disturbance. Negative for depressed mood. The patient is not nervous/anxious.     PMFS History:  Patient Active Problem List   Diagnosis Date Noted  . High risk medication use 10/30/2019  . Left bundle branch block 04/28/2019  . Right wrist pain 03/13/2019  . Hamstring strain, left, initial encounter 02/20/2019  . 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 Primary osteoarthritis of both feet 10/09/2018  . Hx of migraines 09/27/2018  . History of gastroesophageal reflux (GERD) 09/27/2018  . 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 Moderate persistent asthma 07/05/2018  . Hypothyroid 06/28/2018    Past Medical History:  Diagnosis Date  . Arthritis   . Asthma   . Fibroid   . Frequent headaches   . GERD (gastroesophageal reflux disease)   . Gluten intolerance   . H/O seasonal allergies   . History of chicken pox   . History of COVID-19 07/2019  . Left bundle branch block 02/2019   Dr.Paula Ross  . Migraines   . Thyroid disease    Multiple benign nodules--thyroid removed    Family History  Problem Relation Age of Onset  . Arthritis Mother   . Cancer Mother 86       breast ca--dec age 11  . Miscarriages / [place]  Mother   . Breast cancer Mother   . Diabetes Father   . Heart attack Father   . Heart disease Father   . Hyperlipidemia Father   . Hypertension Father   . Stroke Father        x3  . COPD Sister   . Peripheral Artery Disease Sister   . Asthma Brother   . Birth defects Brother   . Cancer  Brother        skin  . Hyperlipidemia Brother   . Heart attack Maternal Grandmother   . Heart disease Maternal Grandmother   . Hyperlipidemia Maternal Grandmother   . Stroke Maternal Grandmother   . Alcohol abuse Paternal Grandmother   . Cancer Paternal Grandmother   . Cancer Paternal Grandfather        breast  . Heart attack Paternal Grandfather   . Breast cancer Paternal Grandfather   . Alcohol abuse Brother   . Early death Brother        suicide  . Mental illness Brother   . Asthma Daughter   . Asthma Son   . Asthma Son   . Breast cancer Maternal Aunt    Past Surgical History:  Procedure Laterality Date  . CARDIAC CATHETERIZATION    . CHOLECYSTECTOMY  1995  . [place]  2009  . HERNIA REPAIR  2015   Has abdominal wall mesh - [place], [place]  . LAPAROSCOPIC HYSTERECTOMY  2007   Supracervical hysterectomy - [place], [place]  . THYROID SURGERY     Social History   Social History Narrative  . Not on file   Immunization History  Administered Date(s) Administered  . Influenza,inj,Quad PF,6+ Mos 06/28/2018, 06/18/2019  . Influenza-Unspecified 01/25/2018  . Pneumococcal Polysaccharide-23 10/22/2018  . Tdap 06/28/2018  . Zoster Recombinat (Shingrix) 04/15/2019     Objective: Vital Signs: BP 110/70 (BP Location: Right Arm, Patient Position: Sitting, Cuff Size: Normal)   Pulse 76   Resp 14   Ht 5\' 7"  (1.702 m)   Wt 179 lb 6.4 oz (81.4 kg)   LMP  (LMP Unknown)   BMI 28.10 kg/m    Physical Exam Vitals and nursing note reviewed.  Constitutional:      Appearance: She is well-developed.  HENT:     Head: Normocephalic and atraumatic.  Eyes:     Conjunctiva/sclera: Conjunctivae normal.  Pulmonary:     Effort: Pulmonary effort is normal.  Abdominal:     General: Bowel sounds are normal.     Palpations: Abdomen is soft.  Musculoskeletal:     Cervical back: Normal range of motion.  Lymphadenopathy:     Cervical: No cervical adenopathy.  Skin:    General:  Skin is warm and dry.     Capillary Refill: Capillary refill takes less than 2 seconds.  Neurological:     Mental Status: She is alert and oriented to person, place, and time.  Psychiatric:        Behavior: Behavior normal.      Musculoskeletal Exam: C-spine, thoracic spine, lumbar spine good range of motion.  No midline spinal tenderness.  Shoulder joints, elbow joints, wrist joints, MCPs, PIPs and DIPs good range of motion with no synovitis.  She has tenderness of bilateral second and third MCP joints.  Tenderness of the right wrist joint on exam.  Hip joints have good range of motion with no discomfort.  Tenderness over the left trochanteric bursa.  Knee joints have good range of motion no warmth or  effusion.  She has tenderness in synovitis of the right ankle joint on exam.  Left ankle has good range of motion no tenderness or synovitis.  CDAI Exam: CDAI Score: 5.8  Patient Global: 4 mm; Provider Global: 4 mm Swollen: 1 ; Tender: 6  Joint Exam 11/13/2019      Right  Left  Wrist   Tender     MCP 2   Tender   Tender  MCP 3   Tender   Tender  Ankle  Swollen Tender        Investigation: No additional findings.  Imaging: No results found.  Recent Labs: Lab Results  Component Value Date   WBC 7.8 10/14/2019   HGB 13.5 10/14/2019   PLT 248 10/14/2019   NA 140 10/14/2019   K 3.9 10/14/2019   CL 104 10/14/2019   CO2 27 10/14/2019   GLUCOSE 82 10/14/2019   BUN 15 10/14/2019   CREATININE 0.70 10/14/2019   BILITOT 0.8 10/14/2019   ALKPHOS 64 07/28/2019   AST 35 10/14/2019   ALT 47 (H) 10/14/2019   PROT 6.5 10/14/2019   ALBUMIN 4.5 07/28/2019   CALCIUM 9.2 10/14/2019   GFRAA 116 10/14/2019   QFTBGOLDPLUS NEGATIVE 10/14/2019    Speciality Comments: PLQ Eye Exam: 04/09/19 WNL [facility] Follow up in 1 year.   Procedures:  No procedures performed Allergies: Orencia [abatacept], Shellfish allergy, Gluten meal, Lortab [hydrocodone-acetaminophen], Morphine and related,  and Strawberry (diagnostic)   Assessment / Plan:     Visit Diagnoses: Rheumatoid arthritis of multiple sites with negative rheumatoid factor ([place]) - Diagnosed at [place] [facility]. The ultrasound examination performed showed synovitis in bilateral hands: She has ongoing tenderness and synovitis of the right ankle joint on exam.  She declined a right ankle joint cortisone injection today she is also experiencing tenderness in the right wrist joint and bilateral second and third MCP joints but no synovitis was noted.  She continues to take Plaquenil 200 mg 1 tablet twice daily.  She was started on Orencia subcutaneous injections on 10/23/2019 and after her second Orencia injection she developed a rash on her torso that spread to her extremities.  She took Allegra-D and Benadryl and her rash resolved within 4 days.  She presents today to discuss starting on Rinvoq 15 mg 1 tablet by mouth daily.  Indications, contraindications, potential side effects were reviewed with the patient today.  All questions were addressed and consent was obtained today.  She was given a sample of Rinvoq to start taking.  She is aware that she will return for lab work in 1 month and every 3 months.  She is advised to notify us if she cannot tolerate taking Rinvoq.  She will follow-up in the office in 6 weeks.- Plan: Upadacitinib ER (RINVOQ) 15 MG TB24  High risk medication use -  Plaquenil 200 mg 1 tablet twice daily.  She will be starting on Rinvoq 15 mg 1 tablet by mouth daily. she had an inadequate response to Enbrel in the past.  D/c MTX due to elevated LFTs.  Discontinued Orencia due to developing a rash.  CBC and CMP were drawn on 10/14/2019.  TB gold was negative on 10/14/2019.  She will return for lab work in 1 month and every 3 months.  Standing orders are in place.  Primary osteoarthritis of both feet: She has ongoing tenderness and synovitis of the right ankle joint.  She declined a cortisone injection in  the right ankle  joint today.  No tenderness of MTP or PIP joints in her feet noted.  She was proper running shoes.  Trochanteric bursitis of both hips: She has tenderness over the left trochanter bursa today.  No tenderness over the right trochanter bursa noted.  She perform stretching exercises on a daily basis.  Elevated LFTs: ALT was 47 AST was 35 on 10/14/2019.  We will continue to monitor.  Other medical conditions are listed as follows:  History of asthma  Hx of migraines  History of gastroesophageal reflux (GERD)  History of hypothyroidism  Decreased cardiac ejection fraction  Orders: No orders of the defined types were placed in this encounter.  Meds ordered this encounter  Medications  . Upadacitinib ER (RINVOQ) 15 MG TB24    Sig: Take 15 mg by mouth daily.    Dispense:  30 tablet    Refill:  2    Face-to-face time spent with patient was 30 minutes. Greater than 50% of time was spent in counseling and coordination of care.  Follow-Up Instructions: Return in about 6 weeks (around 12/25/2019) for Rheumatoid arthritis.   Ofilia Neas, PA-C   I examined and evaluated the patient with Hazel Sams PA.  Patient continues to have synovitis in multiple joints on my examination as described above.  She could not tolerate Orencia.  She was a started on Rinvoq today.  We will see response to it.  The plan of care was discussed as noted above.  Bo Merino, MD Note - This record has been created using Editor, commissioning.  Chart creation errors have been sought, but may not always  have been located. Such creation errors do not reflect on  the standard of medical care.

## 2019-11-13 ENCOUNTER — Ambulatory Visit: Payer: 59 | Admitting: Rheumatology

## 2019-11-13 ENCOUNTER — Other Ambulatory Visit: Payer: Self-pay

## 2019-11-13 ENCOUNTER — Encounter: Payer: Self-pay | Admitting: Rheumatology

## 2019-11-13 VITALS — BP 110/70 | HR 76 | Resp 14 | Ht 67.0 in | Wt 179.4 lb

## 2019-11-13 DIAGNOSIS — M7061 Trochanteric bursitis, right hip: Secondary | ICD-10-CM

## 2019-11-13 DIAGNOSIS — Z79899 Other long term (current) drug therapy: Secondary | ICD-10-CM | POA: Diagnosis not present

## 2019-11-13 DIAGNOSIS — M0609 Rheumatoid arthritis without rheumatoid factor, multiple sites: Secondary | ICD-10-CM

## 2019-11-13 DIAGNOSIS — M19071 Primary osteoarthritis, right ankle and foot: Secondary | ICD-10-CM | POA: Diagnosis not present

## 2019-11-13 DIAGNOSIS — Z8709 Personal history of other diseases of the respiratory system: Secondary | ICD-10-CM

## 2019-11-13 DIAGNOSIS — M19072 Primary osteoarthritis, left ankle and foot: Secondary | ICD-10-CM

## 2019-11-13 DIAGNOSIS — Z8639 Personal history of other endocrine, nutritional and metabolic disease: Secondary | ICD-10-CM

## 2019-11-13 DIAGNOSIS — Z8669 Personal history of other diseases of the nervous system and sense organs: Secondary | ICD-10-CM

## 2019-11-13 DIAGNOSIS — Z8719 Personal history of other diseases of the digestive system: Secondary | ICD-10-CM

## 2019-11-13 DIAGNOSIS — R7989 Other specified abnormal findings of blood chemistry: Secondary | ICD-10-CM

## 2019-11-13 DIAGNOSIS — M7062 Trochanteric bursitis, left hip: Secondary | ICD-10-CM

## 2019-11-13 DIAGNOSIS — R931 Abnormal findings on diagnostic imaging of heart and coronary circulation: Secondary | ICD-10-CM

## 2019-11-13 MED ORDER — RINVOQ 15 MG PO TB24: 15.0000 mg | ORAL_TABLET | Freq: Every day | ORAL | 2 refills | Status: DC

## 2019-11-13 NOTE — Progress Notes (Signed)
Pharmacy Note  Subjective: Patient presents today to [facility] for follow up office visit. Patient seen by the pharmacist for counseling on Rinvoq for rheumatoid arthritis.  She failed monotherapy Plaquenil, MTX caused elevated LFT's, inadequate response to Enbrel, Humira caused severe N/V, and developed rash on trunk after second dose of Orencia.  Objective:  CMP     Component Value Date/Time   NA 140 10/14/2019 1525   NA 137 07/25/2016 0000   K 3.9 10/14/2019 1525   CL 104 10/14/2019 1525   CO2 27 10/14/2019 1525   GLUCOSE 82 10/14/2019 1525   BUN 15 10/14/2019 1525   CREATININE 0.70 10/14/2019 1525   CALCIUM 9.2 10/14/2019 1525   PROT 6.5 10/14/2019 1525   PROT 6.5 07/28/2019 0847   ALBUMIN 4.5 07/28/2019 0847   AST 35 10/14/2019 1525   ALT 47 (H) 10/14/2019 1525   ALKPHOS 64 07/28/2019 0847    CBC    Component Value Date/Time   WBC 7.8 10/14/2019 1525   RBC 4.63 10/14/2019 1525   HGB 13.5 10/14/2019 1525   HCT 39.2 10/14/2019 1525   PLT 248 10/14/2019 1525   MCV 84.7 10/14/2019 1525   MCH 29.2 10/14/2019 1525   MCHC 34.4 10/14/2019 1525   RDW 12.6 10/14/2019 1525    Baseline Immunosuppressant Therapy Labs TB GOLD Quantiferon TB Gold Latest Ref Rng & Units 10/14/2019  Quantiferon TB Gold Plus NEGATIVE NEGATIVE   Hepatitis Panel Hepatitis Latest Ref Rng & Units 10/23/2018  Hep B Surface Ag NON-REACTI NON-REACTIVE  Hep B IgM NON-REACTI NON-REACTIVE  Hep C Ab NON-REACTI NON-REACTIVE  Hep C Ab NON-REACTI NON-REACTIVE  Hep A IgM NON-REACTI NON-REACTIVE   HIV Lab Results  Component Value Date   HIV NON-REACTIVE 10/23/2018   Immunoglobulins Immunoglobulin Electrophoresis Latest Ref Rng & Units 10/23/2018  IgA  47 - 310 mg/dL 195  IgG 600 - 1,640 mg/dL 890  IgM 50 - 300 mg/dL 115   SPEP Serum Protein Electrophoresis Latest Ref Rng & Units 10/14/2019  Total Protein 6.1 - 8.1 g/dL 6.5  Albumin 3.8 - 4.8 g/dL -  Alpha-1 0.2 - 0.3 g/dL -  Alpha-2  0.5 - 0.9 g/dL -  Beta Globulin 0.4 - 0.6 g/dL -  Beta 2 0.2 - 0.5 g/dL -  Gamma Globulin 0.8 - 1.7 g/dL -   G6PD Lab Results  Component Value Date   G6PDH 16.2 09/27/2018   TPMT No results found for: TPMT   Lipid Panel Lab Results  Component Value Date   CHOL 147 07/28/2019   HDL 71 07/28/2019   LDLCALC 63 07/28/2019   TRIG 67 07/28/2019   CHOLHDL 2.1 07/28/2019     Does patient have history of diverticulitis?  No  Assessment/Plan:  Counseled patient that Rinvoq is a JAK inhibitor indicated for Rheumatoid Arthritis.  Counseled patient on purpose, proper use, and adverse effects of Rinvoq.    Reviewed the most common adverse effects including infection, diarrhea, headaches.  Also reviewed rare adverse effects such as bowel injury and the need to contact us if they develop stomach pain during treatment. Counseled on the increase risk of venous thrombosis. Reviewed with patient that there is the possibility of an increased risk of malignancy but it is not well understood if this increased risk is due to the medication or the disease state.  Instructed patient that medication should be held for infection and prior to surgery.  Advised patient to avoid live vaccines. Recommend annual influenza, Pneumovax 23,  Prevnar 13, and Shingrix as indicated.    Reviewed the importance of routine lab monitoring including lipid panel.  Standing orders placed. Provided patient with medication education material and answered all questions.  Patient consented to Rinvoq.  Will upload into patient's chart.    Patient dose will be 15 mg daily.  Rinvoq has already been approved through insurance.  She must fill through [facility] per insurance.  She has Pharmacist, community and is eligible to use a co-pay card.  Patient given information to sign up for co-pay card to help with medication cost.   Medication Samples have been provided to the patient.  Drug name: Rinvoq        Strength: 15mg           Qty: 14   LOT: [id number]   Exp.Date: 02/22/2021 Dosing instructions: Take 1 tablet by mouth daily.  She is to return in 1 month for labs and follow-up visit.  All questions encouraged and answered.  Instructed patient to reach out with any other questions or concerns.   Mariella Saa, PharmD, [place], [place] [facility] Pharmacist [phone]  11/13/2019 10:43 AM

## 2019-11-13 NOTE — Patient Instructions (Signed)
Standing Labs We placed an order today for your standing lab work.    Please come back and get your standing labs in 1 month then every 3 months   We have open lab daily Monday through Thursday from 8:30-12:30 PM and 1:30-4:30 PM and Friday from 8:30-12:30 PM and 1:30-4:00 PM at the office of Dr. Bo Merino.   You may experience shorter wait times on Monday and Friday afternoons. The office is located at [address] [place], [place], [place] [address] No appointment is necessary.   Labs are drawn by [facility].  You may receive a bill from [facility] for your lab work.  If you wish to have your labs drawn at another location, please call the office 24 hours in advance to send orders.  If you have any questions regarding directions or hours of operation,  please call [phone].   Just as a reminder please drink plenty of water prior to coming for your lab work. Thanks!

## 2019-11-17 ENCOUNTER — Encounter: Payer: Self-pay | Admitting: Obstetrics and Gynecology

## 2019-11-19 ENCOUNTER — Other Ambulatory Visit: Payer: Self-pay | Admitting: Family Medicine

## 2019-11-20 ENCOUNTER — Other Ambulatory Visit: Payer: Self-pay | Admitting: Family Medicine

## 2019-11-20 ENCOUNTER — Ambulatory Visit: Payer: 59 | Admitting: Physician Assistant

## 2019-11-21 ENCOUNTER — Ambulatory Visit
Admission: RE | Admit: 2019-11-21 | Discharge: 2019-11-21 | Disposition: A | Discharge status: Home / Self Care | Payer: 59 | Source: Ambulatory Visit | Attending: Obstetrics and Gynecology | Admitting: Obstetrics and Gynecology

## 2019-11-21 ENCOUNTER — Other Ambulatory Visit: Payer: Self-pay

## 2019-11-21 DIAGNOSIS — Z1231 Encounter for screening mammogram for malignant neoplasm of breast: Secondary | ICD-10-CM

## 2019-11-28 ENCOUNTER — Encounter: Payer: Self-pay | Admitting: Family Medicine

## 2019-11-28 ENCOUNTER — Ambulatory Visit (INDEPENDENT_AMBULATORY_CARE_PROVIDER_SITE_OTHER): Payer: 59 | Admitting: Family Medicine

## 2019-11-28 ENCOUNTER — Other Ambulatory Visit: Payer: Self-pay

## 2019-11-28 VITALS — BP 120/72 | HR 84 | Temp 98.3°F | Ht 67.0 in | Wt 171.6 lb

## 2019-11-28 DIAGNOSIS — R238 Other skin changes: Secondary | ICD-10-CM | POA: Diagnosis not present

## 2019-11-28 DIAGNOSIS — N951 Menopausal and female climacteric states: Secondary | ICD-10-CM

## 2019-11-28 DIAGNOSIS — R300 Dysuria: Secondary | ICD-10-CM | POA: Diagnosis not present

## 2019-11-28 DIAGNOSIS — M791 Myalgia, unspecified site: Secondary | ICD-10-CM

## 2019-11-28 DIAGNOSIS — R631 Polydipsia: Secondary | ICD-10-CM | POA: Diagnosis not present

## 2019-11-28 DIAGNOSIS — R233 Spontaneous ecchymoses: Secondary | ICD-10-CM

## 2019-11-28 LAB — POCT URINALYSIS DIPSTICK
Bilirubin, UA: NEGATIVE
Blood, UA: NEGATIVE
Glucose, UA: NEGATIVE
Ketones, UA: NEGATIVE
Leukocytes, UA: NEGATIVE
Nitrite, UA: NEGATIVE
Protein, UA: NEGATIVE
Spec Grav, UA: 1.01 (ref 1.010–1.025)
Urobilinogen, UA: 0.2 E.U./dL
pH, UA: 6 (ref 5.0–8.0)

## 2019-11-28 LAB — COMPREHENSIVE METABOLIC PANEL
ALT: 37 U/L — ABNORMAL HIGH (ref 0–35)
AST: 39 U/L — ABNORMAL HIGH (ref 0–37)
Albumin: 4.6 g/dL (ref 3.5–5.2)
Alkaline Phosphatase: 49 U/L (ref 39–117)
BUN: 17 mg/dL (ref 6–23)
CO2: 28 mEq/L (ref 19–32)
Calcium: 9.8 mg/dL (ref 8.4–10.5)
Chloride: 100 mEq/L (ref 96–112)
Creatinine, Ser: 0.91 mg/dL (ref 0.40–1.20)
GFR: 65.02 mL/min (ref >60.00)
Glucose, Bld: 97 mg/dL (ref 70–99)
Potassium: 4.8 mEq/L (ref 3.5–5.1)
Sodium: 135 mEq/L (ref 135–145)
Total Bilirubin: 1 mg/dL (ref 0.2–1.2)
Total Protein: 7.1 g/dL (ref 6.0–8.3)

## 2019-11-28 LAB — CBC WITH DIFFERENTIAL/PLATELET
Basophils Absolute: 0 10*3/uL (ref 0.0–0.1)
Basophils Relative: 0.5 % (ref 0.0–3.0)
Eosinophils Absolute: 0.1 10*3/uL (ref 0.0–0.7)
Eosinophils Relative: 0.8 % (ref 0.0–5.0)
HCT: 41.3 % (ref 36.0–46.0)
Hemoglobin: 13.7 g/dL (ref 12.0–15.0)
Lymphocytes Relative: 43.3 % (ref 12.0–46.0)
Lymphs Abs: 2.8 10*3/uL (ref 0.7–4.0)
MCHC: 33.2 g/dL (ref 30.0–36.0)
MCV: 87.2 fl (ref 78.0–100.0)
Monocytes Absolute: 0.4 10*3/uL (ref 0.1–1.0)
Monocytes Relative: 6.9 % (ref 3.0–12.0)
Neutro Abs: 3.2 10*3/uL (ref 1.4–7.7)
Neutrophils Relative %: 48.5 % (ref 43.0–77.0)
Platelets: 302 10*3/uL (ref 150.0–400.0)
RBC: 4.74 Mil/uL (ref 3.87–5.11)
RDW: 13.8 % (ref 11.5–15.5)
WBC: 6.5 10*3/uL (ref 4.0–10.5)

## 2019-11-28 LAB — TSH: TSH: 1.11 u[IU]/mL (ref 0.35–4.50)

## 2019-11-28 LAB — URINALYSIS, MICROSCOPIC ONLY

## 2019-11-28 LAB — HEMOGLOBIN A1C: Hgb A1c MFr Bld: 5 % (ref 4.6–6.5)

## 2019-11-28 LAB — PROTIME-INR
INR: 1 ratio (ref 0.8–1.0)
Prothrombin Time: 11.1 s (ref 9.6–13.1)

## 2019-11-28 MED ORDER — CYCLOBENZAPRINE HCL 10 MG PO TABS: 10.0000 mg | ORAL_TABLET | Freq: Three times a day (TID) | ORAL | 0 refills | Status: DC | PRN

## 2019-11-28 MED ORDER — GABAPENTIN 600 MG PO TABS: 600.0000 mg | ORAL_TABLET | Freq: Every day | ORAL | 3 refills | Status: DC

## 2019-11-28 NOTE — Progress Notes (Signed)
Patient: [name] MRN: [id number] DOB: Jan 20, 1966 PCP: Orma Flaming, MD     Subjective:  Chief Complaint  Patient presents with  . Dysuria    x 2 days ago  . Back Pain  . easy bruising  . Polydipsia  . Hot Flashes    HPI: The patient is a 54 y.o. female who presents today for complaints and concerns regarding:   Dysuria: Patient complains of abnormal smelling urine, burning with urination, dysuria, frequency and urgency She has had symptoms for 2 days. Patient also complains of back pain. Patient denies fever. Patient does not have a history of recurrent UTI. No true CVA tenderness, blood in stool. She does have increased frequency, but also states she has been drinking a lot of water recently and just feels really thirsty.   She also has some right sided throacic pain, above CVA. All started at same time. No fever/chills. +nausea, but no vomiting. Denies doing any heavy lifting. Does work out on regular basis, but didn't do anything out of ordinary.   Hot flashes: I started her on gabapentin. She has titrated up to 600mg  and is very happy with this dose. Would like a refill.   Easy bruising: noticed a lot of bruising on her legs and can not recall any trauma. Wakes up with big bruises. No petechiae, no hx of any blood disorders. No excessive fatigue, sob. Denies any vaginal bleeding, nose bleeds or other bleeding.   Started on new medication 2 weeks ago: rinvoq.   Review of Systems  Constitutional: Negative for activity change and appetite change.  HENT: Negative for congestion, ear discharge, ear pain and sinus pressure.   Eyes: Negative for pain, discharge and itching.  Respiratory: Positive for shortness of breath. Negative for cough and wheezing.        Had pos covid in November still having issues with breathing   Cardiovascular: Negative for chest pain, palpitations and leg swelling.  Gastrointestinal: Negative for constipation, diarrhea, nausea, rectal pain and  vomiting.  Endocrine: Positive for polydipsia.  Genitourinary: Positive for dysuria, frequency and urgency. Negative for decreased urine volume, flank pain, hematuria, vaginal bleeding, vaginal discharge and vaginal pain.  Musculoskeletal: Positive for arthralgias and back pain.  Neurological: Negative for dizziness, seizures, syncope and headaches.  Hematological: Bruises/bleeds easily.  Psychiatric/Behavioral: Negative for agitation, behavioral problems, hallucinations and suicidal ideas.    Allergies Patient is allergic to orencia [abatacept]; shellfish allergy; gluten meal; lortab [hydrocodone-acetaminophen]; morphine and related; and strawberry (diagnostic).  Past Medical History Patient  has a past medical history of Arthritis, Asthma, Fibroid, Frequent headaches, GERD (gastroesophageal reflux disease), Gluten intolerance, H/O seasonal allergies, History of chicken pox, History of COVID-19 (07/2019), Left bundle branch block (02/2019), Migraines, and Thyroid disease.  Surgical History Patient  has a past surgical history that includes Cholecystectomy (1995); Laparoscopic hysterectomy (2007); Thyroid surgery; Cardiac catheterization; Hammer toe surgery (2009); and Hernia repair (2015).  Family History Pateint's family history includes Alcohol abuse in her brother and paternal grandmother; Arthritis in her mother; Asthma in her brother, daughter, son, and son; Birth defects in her brother; Breast cancer in her maternal aunt and paternal grandfather; Breast cancer (age of onset: 80) in her mother; COPD in her sister; Cancer in her brother, paternal grandfather, and paternal grandmother; Cancer (age of onset: 24) in her mother; Diabetes in her father; Early death in her brother; Heart attack in her father, maternal grandmother, and paternal grandfather; Heart disease in her father and maternal grandmother; Hyperlipidemia in her  brother, father, and maternal grandmother; Hypertension in her  father; Mental illness in her brother; Miscarriages / [place] in her mother; Peripheral Artery Disease in her sister; Stroke in her father and maternal grandmother.  Social History Patient  reports that she has never smoked. She has never used smokeless tobacco. She reports current alcohol use. She reports that she does not use drugs.    Objective: Vitals:   11/28/19 0943  BP: 120/72  Pulse: 84  Temp: 98.3 F (36.8 C)  TempSrc: Temporal  SpO2: 98%  Weight: 171 lb 9.6 oz (77.8 kg)  Height: 5\' 7"  (1.702 m)    Body mass index is 26.88 kg/m.  Physical Exam Vitals reviewed.  Constitutional:      Appearance: Normal appearance. She is normal weight.  HENT:     Head: Normocephalic and atraumatic.     Nose: Nose normal.     Mouth/Throat:     Mouth: Mucous membranes are moist.  Eyes:     Extraocular Movements: Extraocular movements intact.     Conjunctiva/sclera: Conjunctivae normal.     Pupils: Pupils are equal, round, and reactive to light.  Cardiovascular:     Rate and Rhythm: Normal rate and regular rhythm.     Heart sounds: Normal heart sounds.  Pulmonary:     Effort: Pulmonary effort is normal.     Breath sounds: Normal breath sounds.  Abdominal:     General: Abdomen is flat. Bowel sounds are normal.     Palpations: Abdomen is soft.     Tenderness: There is no abdominal tenderness. There is no right CVA tenderness or left CVA tenderness.  Musculoskeletal:        General: Tenderness (over right thoracic paraspinal muscle. ) present.     Cervical back: Normal range of motion and neck supple.  Lymphadenopathy:     Cervical: No cervical adenopathy.  Skin:    General: Skin is warm.     Findings: Bruising (on lower legs, large bruises. no petehcie. ) present.  Neurological:     General: No focal deficit present.     Mental Status: She is alert and oriented to person, place, and time.    UA: normal     Assessment/plan: 1. Dysuria UA completely normal. Denies any  rashes/itching. Will wait on culture. No red flags and normal exam. Any fever/worsening pain, go to Er.  - Urine Microscopic; Future - Urine Culture - POCT urinalysis dipstick - Urine Microscopic  2. Muscle pain Appears to be strain of her right paraspinal muscle. Can do heat, ibuprofen prn and will try muscle relaxer prn. Drowsy precautions given. Stretching discussed. I also advised she ask her rheumatologist to see if any of these symptoms could be associated with new drug they started called rinvoq. Advised she email and ask.   3. Easy bruising Appears to be normal to me. Will check inr/cbc today.  - CBC with Differential/Platelet - Protime-INR - TSH  4. Polydipsia Checking labs. Ask rheumatology about rinvoq SE. Could also be drinking more due to gabapentin causing xerostomia.  - Hemoglobin A1c - Comprehensive metabolic panel  5. Hot flashes due to menopause Doing great on 600mg  of gabapentin. Refills given.     This visit occurred during the SARS-CoV-2 public health emergency.  Safety protocols were in place, including screening questions prior to the visit, additional usage of staff PPE, and extensive cleaning of exam room while observing appropriate contact time as indicated for disinfecting solutions.     Return if  symptoms worsen or fail to improve.   Orma Flaming, MD [place]   11/28/2019

## 2019-11-28 NOTE — Patient Instructions (Signed)
Pain in back: I feel like it's a muscle spasm. Sent in muscle relaxer for you to take as needed. Continue heated pad/stretching. Massage may help as well.   Urine is clean, will wait on culture. Checking lots of labs on you. I would email dr. Estanislado Pandy and ask her if could be new medicine?

## 2019-11-29 LAB — URINE CULTURE
MICRO NUMBER:: 10246000
SPECIMEN QUALITY:: ADEQUATE

## 2019-12-08 ENCOUNTER — Telehealth: Payer: Self-pay | Admitting: Rheumatology

## 2019-12-08 NOTE — Telephone Encounter (Signed)
Patient called stating she just received her first COVID vaccine and received paperwork that recommended holding off on taking Rinvoq for a week after receiving the vaccine.  Patient states she feels like the medication is just starting to work and hopes that she continue taking it.  Patient requested a return call.

## 2019-12-08 NOTE — Telephone Encounter (Signed)
Patient advised it is recommended to hold Rinvoq for 1 week after receiving Covid vaccine.

## 2019-12-25 NOTE — Progress Notes (Signed)
Office Visit Note  Patient: [name]             Date of Birth: [birth year]           MRN: [id number]             PCP: Orma Flaming, MD Referring: Orma Flaming, MD Visit Date: 12/30/2019 Occupation: @GUAROCC @  Subjective:  Joint pain.   History of Present Illness: [name] is a 54 y.o. female with history of seronegative rheumatoid arthritis and osteoarthritis. She states she has been doing really well as regards to her rheumatoid arthritis. Although after her 1st vaccine of COVID-19 she held Rinvoq for 1 week and had a mild flare with increased pain and discomfort in her wrist joints and right ankle joint. She states gradually improving but has not recovered completely. She denies any joint swelling. Her 2nd vaccine is coming up soon. She been taking Plaquenil on a regular basis.  Activities of Daily Living:  Patient reports morning stiffness for 45-60 minutes.   Patient Denies nocturnal pain.  Difficulty dressing/grooming: Denies Difficulty climbing stairs: Denies Difficulty getting out of chair: Denies Difficulty using hands for taps, buttons, cutlery, and/or writing: Reports  Review of Systems  Constitutional: Negative for fatigue, night sweats, weight gain and weight loss.  HENT: Positive for mouth dryness. Negative for mouth sores, trouble swallowing, trouble swallowing and nose dryness.   Eyes: Positive for dryness. Negative for pain, redness, itching and visual disturbance.  Respiratory: Negative for cough, shortness of breath and difficulty breathing.   Cardiovascular: Negative for chest pain, palpitations, hypertension, irregular heartbeat and swelling in legs/feet.  Gastrointestinal: Negative for blood in stool, constipation and diarrhea.  Endocrine: Negative for increased urination.  Genitourinary: Negative for difficulty urinating, painful urination and vaginal dryness.  Musculoskeletal: Positive for arthralgias, joint pain and morning stiffness. Negative  for joint swelling, myalgias, muscle weakness, muscle tenderness and myalgias.  Skin: Negative for color change, rash, hair loss, redness, skin tightness, ulcers and sensitivity to sunlight.  Allergic/Immunologic: Negative for susceptible to infections.  Neurological: Negative for dizziness, numbness, headaches, memory loss, night sweats and weakness.  Hematological: Negative for bruising/bleeding tendency and swollen glands.  Psychiatric/Behavioral: Positive for sleep disturbance. Negative for depressed mood and confusion. The patient is not nervous/anxious.     PMFS History:  Patient Active Problem List   Diagnosis Date Noted  . High risk medication use 10/30/2019  . Left bundle branch block 04/28/2019  . Right wrist pain 03/13/2019  . Hamstring strain, left, initial encounter 02/20/2019  . 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 Primary osteoarthritis of both feet 10/09/2018  . Hx of migraines 09/27/2018  . History of gastroesophageal reflux (GERD) 09/27/2018  . 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 Moderate persistent asthma 07/05/2018  . Hypothyroid 06/28/2018    Past Medical History:  Diagnosis Date  . Arthritis   . Asthma   . Fibroid   . Frequent headaches   . GERD (gastroesophageal reflux disease)   . Gluten intolerance   . H/O seasonal allergies   . History of chicken pox   . History of COVID-19 07/2019  . Left bundle branch block 02/2019   Dr.Paula Ross  . Migraines   . Thyroid disease    Multiple benign nodules--thyroid removed    Family History  Problem Relation Age of Onset  . Arthritis Mother   . Cancer Mother 84       breast ca--dec age 54  . Miscarriages / [place] Mother   .  Breast cancer Mother 63  . Diabetes Father   . Heart attack Father   . Heart disease Father   . Hyperlipidemia Father   . Hypertension Father   . Stroke Father        x4  . COPD Sister   . Peripheral Artery Disease Sister   . Asthma Brother   . Birth  defects Brother   . Cancer Brother        skin  . Hyperlipidemia Brother   . Heart attack Maternal Grandmother   . Heart disease Maternal Grandmother   . Hyperlipidemia Maternal Grandmother   . Stroke Maternal Grandmother   . Alcohol abuse Paternal Grandmother   . Cancer Paternal Grandmother   . Cancer Paternal Grandfather        breast  . Heart attack Paternal Grandfather   . Breast cancer Paternal Grandfather   . Alcohol abuse Brother   . Early death Brother        suicide  . Mental illness Brother   . Asthma Daughter   . Asthma Son   . Asthma Son   . Breast cancer Maternal Aunt    Past Surgical History:  Procedure Laterality Date  . CARDIAC CATHETERIZATION    . CHOLECYSTECTOMY  1995  . [place]  2009  . HERNIA REPAIR  2015   Has abdominal wall mesh - [place], [place].  Laparoscopic incisional hernia repari with 7 x 9 inch Ventralight ST with Echo mesh  . LAPAROSCOPIC HYSTERECTOMY  2007   Supracervical hysterectomy with cystosctopy [place]  . THYROID SURGERY     Social History   Social History Narrative  . Not on file   Immunization History  Administered Date(s) Administered  . Influenza,inj,Quad PF,6+ Mos 06/28/2018, 06/18/2019  . Influenza-Unspecified 01/25/2018  . PFIZER SARS-COV-2 Vaccination 12/08/2019  . Pneumococcal Polysaccharide-23 10/22/2018  . Tdap 06/28/2018  . Zoster Recombinat (Shingrix) 04/15/2019     Objective: Vital Signs: BP 128/71 (BP Location: Left Arm, Patient Position: Sitting, Cuff Size: Normal)   Pulse 85   Resp 14   Ht 5\' 7"  (1.702 m)   Wt 178 lb (80.7 kg)   LMP  (LMP Unknown)   BMI 27.88 kg/m    Physical Exam Vitals and nursing note reviewed.  Constitutional:      Appearance: She is well-developed.  HENT:     Head: Normocephalic and atraumatic.  Eyes:     Conjunctiva/sclera: Conjunctivae normal.  Cardiovascular:     Rate and Rhythm: Normal rate and regular rhythm.     Heart sounds: Normal heart sounds.    Pulmonary:     Effort: Pulmonary effort is normal.     Breath sounds: Normal breath sounds.  Abdominal:     General: Bowel sounds are normal.     Palpations: Abdomen is soft.  Musculoskeletal:     Cervical back: Normal range of motion.  Lymphadenopathy:     Cervical: No cervical adenopathy.  Skin:    General: Skin is warm and dry.     Capillary Refill: Capillary refill takes less than 2 seconds.  Neurological:     Mental Status: She is alert and oriented to person, place, and time.  Psychiatric:        Behavior: Behavior normal.      Musculoskeletal Exam: C-spine, thoracic and lumbar spine with good range of motion. Shoulder joints, elbow joints, wrist joints, MCPs and PIPs with good range of motion with no synovitis. She had mild tenderness  over bilateral wrist joints. Hip joints, ankle joints, MTPs and PIPs with good range of motion. She had mild tenderness over right ankle joint with no synovitis.  CDAI Exam: CDAI Score: 2.6  Patient Global: 3 mm; Provider Global: 3 mm Swollen: 0 ; Tender: 3  Joint Exam 12/30/2019      Right  Left  Wrist   Tender   Tender  Ankle   Tender        Investigation: No additional findings.  Imaging: No results found.  Recent Labs: Lab Results  Component Value Date   WBC 6.5 11/28/2019   HGB 13.7 11/28/2019   PLT 302.0 11/28/2019   NA 135 11/28/2019   K 4.8 11/28/2019   CL 100 11/28/2019   CO2 28 11/28/2019   GLUCOSE 97 11/28/2019   BUN 17 11/28/2019   CREATININE 0.91 11/28/2019   BILITOT 1.0 11/28/2019   ALKPHOS 49 11/28/2019   AST 39 (H) 11/28/2019   ALT 37 (H) 11/28/2019   PROT 7.1 11/28/2019   ALBUMIN 4.6 11/28/2019   CALCIUM 9.8 11/28/2019   GFRAA 116 10/14/2019   QFTBGOLDPLUS NEGATIVE 10/14/2019    Speciality Comments: PLQ Eye Exam: 04/09/19 [facility] Follow up in 1 year.   Procedures:  No procedures performed Allergies: Orencia [abatacept], Shellfish allergy, Gluten meal, Lortab  [hydrocodone-acetaminophen], Morphine and related, and Strawberry (diagnostic)   Assessment / Plan:     Visit Diagnoses: Rheumatoid arthritis of multiple sites with negative rheumatoid factor ([place]) - Diagnosed at [place] [facility]. The ultrasound examination performed showed synovitis in bilateral hands: Patient is doing much better on Rinvoq and Plaquenil combination. She had mild flare when she stopped Rinvoq for 1 week after the Covid 19 vaccine. She has been having some tenderness in her wrist joints and her right ankle but no synovitis was noted on my examination today. She has next dose coming up. She was advised to stop Rinvoq again after the next vaccine for 1 week.  High risk medication use - Plaquenil 200 mg 1 tab BID.  Starting on Rinvoq 15 mg daily. Inadequate response to Enbrel. D/c MTX-elevated LFTs, d/c Orencia-rash. Her LFTs are still elevated which could be because of statin use. Weight loss diet and exercise was discussed. She had some intentional weight loss.  Primary osteoarthritis of both feet-currently not having much discomfort.  Trochanteric bursitis of both hips -she continues to have some trochanteric bursitis. Stretching exercises were emphasized.  Elevated LFTs-could be due to use of statins and high BMI. She had intentional weight loss.  Other medical problems are listed as follows:  History of asthma  Hx of migraines  History of gastroesophageal reflux (GERD)  History of hypothyroidism  Decreased cardiac ejection fraction  Orders: No orders of the defined types were placed in this encounter.  No orders of the defined types were placed in this encounter.   Follow-Up Instructions: Return in about 5 months (around 05/31/2020) for Rheumatoid arthritis.   Bo Merino, MD  Note - This record has been created using Editor, commissioning.  Chart creation errors have been sought, but may not always  have been located. Such creation errors  do not reflect on  the standard of medical care.

## 2019-12-30 ENCOUNTER — Other Ambulatory Visit: Payer: Self-pay

## 2019-12-30 ENCOUNTER — Ambulatory Visit: Payer: 59 | Admitting: Rheumatology

## 2019-12-30 ENCOUNTER — Encounter: Payer: Self-pay | Admitting: Rheumatology

## 2019-12-30 VITALS — BP 128/71 | HR 85 | Resp 14 | Ht 67.0 in | Wt 178.0 lb

## 2019-12-30 DIAGNOSIS — M7061 Trochanteric bursitis, right hip: Secondary | ICD-10-CM

## 2019-12-30 DIAGNOSIS — M0609 Rheumatoid arthritis without rheumatoid factor, multiple sites: Secondary | ICD-10-CM

## 2019-12-30 DIAGNOSIS — Z8719 Personal history of other diseases of the digestive system: Secondary | ICD-10-CM

## 2019-12-30 DIAGNOSIS — Z79899 Other long term (current) drug therapy: Secondary | ICD-10-CM

## 2019-12-30 DIAGNOSIS — R7989 Other specified abnormal findings of blood chemistry: Secondary | ICD-10-CM

## 2019-12-30 DIAGNOSIS — Z8709 Personal history of other diseases of the respiratory system: Secondary | ICD-10-CM

## 2019-12-30 DIAGNOSIS — R931 Abnormal findings on diagnostic imaging of heart and coronary circulation: Secondary | ICD-10-CM

## 2019-12-30 DIAGNOSIS — M19071 Primary osteoarthritis, right ankle and foot: Secondary | ICD-10-CM | POA: Diagnosis not present

## 2019-12-30 DIAGNOSIS — M19072 Primary osteoarthritis, left ankle and foot: Secondary | ICD-10-CM

## 2019-12-30 DIAGNOSIS — Z8639 Personal history of other endocrine, nutritional and metabolic disease: Secondary | ICD-10-CM

## 2019-12-30 DIAGNOSIS — Z8669 Personal history of other diseases of the nervous system and sense organs: Secondary | ICD-10-CM

## 2019-12-30 DIAGNOSIS — M7062 Trochanteric bursitis, left hip: Secondary | ICD-10-CM

## 2019-12-30 NOTE — Patient Instructions (Signed)
Standing Labs We placed an order today for your standing lab work.    Please come back and get your standing labs in June and every 3 months.   We have open lab daily Monday through Thursday from 8:30-12:30 PM and 1:30-4:30 PM and Friday from 8:30-12:30 PM and 1:30-4:00 PM at the office of Dr. Mikaela Hilgeman.   You may experience shorter wait times on Monday and Friday afternoons. The office is located at [address], [address], [place], [place] [address] No appointment is necessary.   Labs are drawn by [facility].  You may receive a bill from [facility] for your lab work.  If you wish to have your labs drawn at another location, please call the office 24 hours in advance to send orders.  If you have any questions regarding directions or hours of operation,  please call [phone].   Just as a reminder please drink plenty of water prior to coming for your lab work. Thanks!  

## 2020-01-11 ENCOUNTER — Other Ambulatory Visit: Payer: Self-pay | Admitting: Rheumatology

## 2020-01-11 DIAGNOSIS — M1230 Palindromic rheumatism, unspecified site: Secondary | ICD-10-CM

## 2020-01-12 MED ORDER — HYDROXYCHLOROQUINE SULFATE 200 MG PO TABS: 200.0000 mg | ORAL_TABLET | Freq: Two times a day (BID) | ORAL | 0 refills | Status: DC

## 2020-01-12 NOTE — Telephone Encounter (Signed)
Last Visit: 12/30/2019 Next Visit: 06/01/2020 Labs: 11/28/2019 AST 39, ALT 37 Eye exam: 04/09/2019  Okay to refill per Dr. Estanislado Pandy.

## 2020-01-16 ENCOUNTER — Telehealth: Payer: Self-pay | Admitting: Obstetrics and Gynecology

## 2020-01-16 NOTE — Telephone Encounter (Signed)
Spoke with patient regarding urodynamic profile study. Patient would like to proceed with scheduling urodynamic profile study for 01/28/2020. Patient aware that she will be receiving a return call regarding benefits and procedure instructions. Patient is agreeable.  Routing to [place], Therapist, art.

## 2020-01-16 NOTE — Telephone Encounter (Signed)
Call to patient. Per DPR, OK to leave message on voicemail.   Left voicemail requesting a return call to [facility] to review benefits and schedule recommended Urodynamic Profile Study with Brook A. Quincy Simmonds, MD, Cherlynn June

## 2020-01-19 NOTE — Telephone Encounter (Signed)
Spoke with patient. Urinalysis prior to Urodynamics scheduled for 01/23/2020. Urodynamics scheduled for 01/28/2020 at 1:30 pm. OV to discuss results scheduled for 02/02/2020 at 10:30 am with Dr.Silva. Patient is agreeable to date and time. Reviewed Urodynamics with patient. Patient verbalizes understanding.  Routing to provider and will close encounter.

## 2020-01-19 NOTE — Telephone Encounter (Signed)
Spoke with patient regarding benefits for recommended Urodynamic Profile Study. Patient acknowledges understanding of information presented. Patient is aware of cancellation policy.   Call transferred to [facility], [place].

## 2020-01-19 NOTE — Telephone Encounter (Signed)
Patient is returning call to [facility]. Patient stated call dropped while on the phone with [facility].

## 2020-01-23 ENCOUNTER — Ambulatory Visit: Payer: 59

## 2020-01-23 ENCOUNTER — Other Ambulatory Visit: Payer: Self-pay

## 2020-01-26 ENCOUNTER — Ambulatory Visit (INDEPENDENT_AMBULATORY_CARE_PROVIDER_SITE_OTHER): Payer: 59 | Admitting: *Deleted

## 2020-01-26 ENCOUNTER — Other Ambulatory Visit: Payer: Self-pay

## 2020-01-26 VITALS — BP 132/84 | HR 80 | Temp 97.9°F | Resp 14 | Ht 67.5 in | Wt 176.5 lb

## 2020-01-26 DIAGNOSIS — Z01812 Encounter for preprocedural laboratory examination: Secondary | ICD-10-CM | POA: Diagnosis not present

## 2020-01-26 LAB — POCT URINALYSIS DIPSTICK
Bilirubin, UA: NEGATIVE
Blood, UA: NEGATIVE
Glucose, UA: NEGATIVE
Ketones, UA: NEGATIVE
Leukocytes, UA: NEGATIVE
Nitrite, UA: NEGATIVE
Protein, UA: NEGATIVE
Urobilinogen, UA: 0.2 E.U./dL
pH, UA: 7 (ref 5.0–8.0)

## 2020-01-26 NOTE — Progress Notes (Signed)
Patient here for urinalysis prior to urodynamics on 01-28-20. Clean catch urine provided and urine dip was negative. Patient updated. No questions in regards to urodynamics procedure.   Routing to provider and will close encounter.

## 2020-01-28 ENCOUNTER — Ambulatory Visit: Payer: 59 | Admitting: *Deleted

## 2020-01-28 ENCOUNTER — Other Ambulatory Visit: Payer: Self-pay

## 2020-01-28 DIAGNOSIS — N393 Stress incontinence (female) (male): Secondary | ICD-10-CM | POA: Diagnosis not present

## 2020-01-28 NOTE — Progress Notes (Addendum)
GYNECOLOGY  VISIT   HPI: 54 y.o.   Married  Caucasian  female   [phone] with No LMP recorded (lmp unknown). Patient has had a hysterectomy.   here to discuss results of urodynamics testing.   Patient has urinary incontinence with exercise.  She has a first to second degree cystocele and a first to second degree rectocele.  Some constipation symptoms and splinting.  No fecal incontinence.   She has not received improvement from physical therapy in the past.   Urodynamic testing on 01/28/20 Uroflow - void 392 cc, PVR 10 cc.  Continuous flow. CMG - S1 364 cc, S2 755 cc, S3 904 cc.  VLPP 82 cm H2O.  Stable CMG UPP - 34 cm H2O.  Pressure flow - P Det Max 80 cm H2O. Voided 966 cc.    She is interested in genetic testing.   Considering surgery at the end of the summer.   GYNECOLOGIC HISTORY: No LMP recorded (lmp unknown). Patient has had a hysterectomy. Contraception: Hyst--ovares remain Menopausal hormone therapy: None Last mammogram: 11-21-19 3D/Neg/density B/BiRads1 Last pap smear: 10-16-19 Neg:Neg HR HPV,  2019 normal per patient        OB History    Gravida  5   Para  3   Term      Preterm      AB  2   Living  3     SAB  2   TAB      Ectopic      Multiple      Live Births                 Patient Active Problem List   Diagnosis Date Noted  . High risk medication use 10/30/2019  . Left bundle branch block 04/28/2019  . Right wrist pain 03/13/2019  . Hamstring strain, left, initial encounter 02/20/2019  . 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 Primary osteoarthritis of both feet 10/09/2018  . Hx of migraines 09/27/2018  . History of gastroesophageal reflux (GERD) 09/27/2018  . 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 Moderate persistent asthma 07/05/2018  . Hypothyroid 06/28/2018    Past Medical History:  Diagnosis Date  . Arthritis   . Asthma   . Fibroid   . Frequent headaches   . GERD (gastroesophageal reflux disease)   .  Gluten intolerance   . H/O seasonal allergies   . History of chicken pox   . History of COVID-19 07/2019  . Left bundle branch block 02/2019   Dr.Paula Ross  . Migraines   . Thyroid disease    Multiple benign nodules--thyroid removed    Past Surgical History:  Procedure Laterality Date  . CARDIAC CATHETERIZATION    . CHOLECYSTECTOMY  1995  . [place]  2009  . HERNIA REPAIR  2015   Has abdominal wall mesh - [place], [place].  Laparoscopic incisional hernia repari with 7 x 9 inch Ventralight ST with Echo mesh  . LAPAROSCOPIC HYSTERECTOMY  2007   Supracervical hysterectomy with cystosctopy - [place], [place]  . THYROID SURGERY      Current Outpatient Medications  Medication Sig Dispense Refill  . ADVAIR DISKUS 250-50 MCG/DOSE AEPB TAKE 1 PUFF BY MOUTH TWICE A DAY 180 each 3  . albuterol (VENTOLIN HFA) 108 (90 Base) MCG/ACT inhaler TAKE 2 PUFFS BY MOUTH EVERY 6 HOURS AS NEEDED FOR WHEEZE OR SHORTNESS OF BREATH 25.5 g 5  . cholecalciferol (VITAMIN D) 400 units TABS tablet Take 5,000 Units by  mouth.     . clobetasol cream (TEMOVATE) 0.05 % as needed.     . cyclobenzaprine (FLEXERIL) 10 MG tablet Take 1 tablet (10 mg total) by mouth 3 (three) times daily as needed for muscle spasms. 30 tablet 0  . Diclofenac Sodium ([name]) 2 % SOLN Place 1 application onto the skin 2 (two) times daily. 112 g 2  . fexofenadine-pseudoephedrine (ALLEGRA-D 24) 180-240 MG 24 hr tablet One tablet as needed daily for allergies  30 tablet 5  . gabapentin (NEURONTIN) 600 MG tablet Take 1 tablet (600 mg total) by mouth at bedtime. 90 tablet 3  . hydroxychloroquine (PLAQUENIL) 200 MG tablet Take 1 tablet (200 mg total) by mouth 2 (two) times daily. 180 tablet 0  . levothyroxine (SYNTHROID) 150 MCG tablet One tablet by mouth every other day 45 tablet 3  . montelukast (SINGULAIR) 10 MG tablet TAKE 1 TABLET BY MOUTH EVERYDAY AT BEDTIME 90 tablet 3  . SYNTHROID 175 MCG tablet Take 175 mcg by mouth every  morning.    Marland Kitchen Upadacitinib ER (RINVOQ) 15 MG TB24 Take 15 mg by mouth daily. 30 tablet 2  . rosuvastatin (CRESTOR) 5 MG tablet Take 0.5 tablets (2.5 mg total) by mouth daily. 90 tablet 3   No current facility-administered medications for this visit.     ALLERGIES: Orencia [abatacept], Shellfish allergy, Gluten meal, Lortab [hydrocodone-acetaminophen], Morphine and related, and Strawberry (diagnostic)  Family History  Problem Relation Age of Onset  . Arthritis Mother   . Cancer Mother 16       breast ca--dec age 62  . Miscarriages / [place] Mother   . Breast cancer Mother 32  . Diabetes Father   . Heart attack Father   . Heart disease Father   . Hyperlipidemia Father   . Hypertension Father   . Stroke Father        x4  . COPD Sister   . Peripheral Artery Disease Sister   . Asthma Brother   . Birth defects Brother   . Cancer Brother        skin  . Hyperlipidemia Brother   . Heart attack Maternal Grandmother   . Heart disease Maternal Grandmother   . Hyperlipidemia Maternal Grandmother   . Stroke Maternal Grandmother   . Alcohol abuse Paternal Grandmother   . Cancer Paternal Grandmother   . Cancer Paternal Grandfather        breast  . Heart attack Paternal Grandfather   . Breast cancer Paternal Grandfather   . Alcohol abuse Brother   . Early death Brother        suicide  . Mental illness Brother   . Asthma Daughter   . Asthma Son   . Asthma Son   . Breast cancer Maternal Aunt     Social History   Socioeconomic History  . Marital status: Married    Spouse name: Not on file  . Number of children: Not on file  . Years of education: Not on file  . Highest education level: Not on file  Occupational History  . Not on file  Tobacco Use  . Smoking status: Never Smoker  . Smokeless tobacco: Never Used  Substance and Sexual Activity  . Alcohol use: Yes    Comment: occ  . Drug use: Never  . Sexual activity: Yes    Partners: Male    Birth control/protection:  Surgical    Comment: Hyst-ovaries remain  Other Topics Concern  . Not on file  Social History  Narrative  . Not on file   Social Determinants of Health   Financial Resource Strain:   . Difficulty of Paying Living Expenses:   Food Insecurity:   . Worried About Charity fundraiser in the Last Year:   . Arboriculturist in the Last Year:   Transportation Needs:   . Film/video editor (Medical):   Marland Kitchen Lack of Transportation (Non-Medical):   Physical Activity:   . Days of Exercise per Week:   . Minutes of Exercise per Session:   Stress:   . Feeling of Stress :   Social Connections:   . Frequency of Communication with Friends and Family:   . Frequency of Social Gatherings with Friends and Family:   . Attends Religious Services:   . Active Member of Clubs or Organizations:   . Attends Archivist Meetings:   Marland Kitchen Marital Status:   Intimate Partner Violence:   . Fear of Current or Ex-Partner:   . Emotionally Abused:   Marland Kitchen Physically Abused:   . Sexually Abused:     Review of Systems  All other systems reviewed and are negative.   PHYSICAL EXAMINATION:    BP 138/84 (Cuff Size: Large)   Pulse 76   Temp (!) 97.3 F (36.3 C) (Temporal)   Ht 5' 5.25" (1.657 m)   Wt 178 lb 9.6 oz (81 kg)   LMP  (LMP Unknown)   BMI 29.49 kg/m     General appearance: alert, cooperative and appears stated age  ASSESSMENT  Stress incontinence.  Cystocele and rectocele.  Symptomatic.  Status post laparoscopic supracervical hysterectomy.  She has an abdominal wall mesh. Menopausal symptoms and atrophy.  On Gabapentin. Mother deceased from breast cancer.  Maternal aunt with breast cancer. Sister tested negative for BRCA.  Paternal grandfather had breast cancer.  Arthritis.  On Plaquinil and Rinvoq.  Dr. Bennie Dallas managing.   PLAN  Urodynamic testing reviewed.  We discussed TVT midurethral sling, cystoscopy, and anterior and posterior colporrhaphy. Proceed with genetic counseling and  testing.  Will need to coordinate with rheumatology regarding when to stop arthritis amedications prior to and following surgery.  An After Visit Summary was printed and given to the patient.  __30____ minutes face to face time of which over 50% was spent in counseling.

## 2020-01-28 NOTE — Progress Notes (Signed)
[name] is a 54 y.o. female Who presents today for urodynamics testing, ordered by Dr. Quincy Simmonds.    Allergies and medications reviewed.  Denies complaints today. No urinary complaints.   Urine Micro exam: negative for WBC's or RBC's, okay to proceed per Dr. Quincy Simmonds.  Patient reports urinary leakage with jumping around and exercising (running).   Urodynamics testing initiated. Lumax Bladder Catheter #10 [place] and lumax Abdominal Catheter #10 [place].   Post void residual 10 ml.   Urethral catheter placed without issue. Rectal catheter placed without issue.   Urodynamics testing completed. Please see scanned Patient summary report in Epic. Procedure completed and patient tolerated well without complaints. Patient scheduled for follow up office visit with Dr. Quincy Simmonds to discuss results. Patient agreeable.   Patient given post procedure instructions:  You may have a mild bladder and rectal discomfort for a few hours after the test. You may experience some frequent urination and slight burning the first few times you urinate after the test. Rarely, the urine may be blood tinged. These are both due to catheter placements and resolve quickly. You should call our office immediately if you have signs of infection, which may include bladder pain, urinary urgency, fever, or burning during urination. We do encourage you to drink plenty of water after the test.

## 2020-01-30 ENCOUNTER — Other Ambulatory Visit: Payer: Self-pay

## 2020-02-02 ENCOUNTER — Encounter: Payer: Self-pay | Admitting: Obstetrics and Gynecology

## 2020-02-02 ENCOUNTER — Other Ambulatory Visit: Payer: Self-pay

## 2020-02-02 ENCOUNTER — Ambulatory Visit: Payer: 59 | Admitting: Obstetrics and Gynecology

## 2020-02-02 VITALS — BP 138/84 | HR 76 | Temp 97.3°F | Ht 65.25 in | Wt 178.6 lb

## 2020-02-02 DIAGNOSIS — Z803 Family history of malignant neoplasm of breast: Secondary | ICD-10-CM | POA: Diagnosis not present

## 2020-02-02 DIAGNOSIS — N8110 Cystocele, unspecified: Secondary | ICD-10-CM

## 2020-02-02 DIAGNOSIS — N816 Rectocele: Secondary | ICD-10-CM

## 2020-02-02 DIAGNOSIS — N393 Stress incontinence (female) (male): Secondary | ICD-10-CM | POA: Diagnosis not present

## 2020-02-03 ENCOUNTER — Telehealth: Payer: Self-pay | Admitting: Licensed Clinical Social Worker

## 2020-02-03 NOTE — Telephone Encounter (Signed)
Received a genetic counseling referral from Dr. Quincy Simmonds for fhx of breast cancer. [name] has been cld and scheduled for a mychart video visit on 5/24 at 3pm. I verified the pt has an active mychart acct. She's aware to arrive 15 minutes early.

## 2020-02-09 ENCOUNTER — Other Ambulatory Visit: Payer: Self-pay | Admitting: Rheumatology

## 2020-02-09 ENCOUNTER — Ambulatory Visit (HOSPITAL_BASED_OUTPATIENT_CLINIC_OR_DEPARTMENT_OTHER): Payer: 59 | Admitting: Licensed Clinical Social Worker

## 2020-02-09 ENCOUNTER — Encounter: Payer: Self-pay | Admitting: Licensed Clinical Social Worker

## 2020-02-09 DIAGNOSIS — Z808 Family history of malignant neoplasm of other organs or systems: Secondary | ICD-10-CM

## 2020-02-09 DIAGNOSIS — Z8051 Family history of malignant neoplasm of kidney: Secondary | ICD-10-CM

## 2020-02-09 DIAGNOSIS — Z803 Family history of malignant neoplasm of breast: Secondary | ICD-10-CM | POA: Diagnosis not present

## 2020-02-09 DIAGNOSIS — Z8042 Family history of malignant neoplasm of prostate: Secondary | ICD-10-CM

## 2020-02-09 DIAGNOSIS — Z800 Family history of malignant neoplasm of digestive organs: Secondary | ICD-10-CM

## 2020-02-09 DIAGNOSIS — M0609 Rheumatoid arthritis without rheumatoid factor, multiple sites: Secondary | ICD-10-CM

## 2020-02-09 NOTE — Progress Notes (Signed)
REFERRING PROVIDER: Nunzio Cobbs, MD [address] [place],  [place] [address]  PRIMARY PROVIDER:  Orma Flaming, MD  PRIMARY REASON FOR VISIT:  1. Family history of melanoma   2. Family history of breast cancer   3. Family history of prostate cancer   4. Family history of kidney cancer   5. Family history of colon cancer   6. Family history of thyroid cancer     I connected with Ms. Dyk on 02/09/2020 at 2:50 PM EDT by MyChart video and verified that I am speaking with the correct person using two identifiers.    Patient location: home Provider location: [place]:   Ms. Saulnier, a 54 y.o. female, was seen for a [facility] cancer genetics consultation at the request of Dr. Yisroel Ramming due to a family history of cancer.  Ms. Kelliher presents to clinic today to discuss the possibility of a hereditary predisposition to cancer, genetic testing, and to further clarify her future cancer risks, as well as potential cancer risks for family members.   Ms. Reznik is a 54 y.o. female with no personal history of cancer.    CANCER HISTORY:  Oncology History   No history exists.     RISK FACTORS:  Menarche was at age 46.  First live birth at age 77.  OCP use for approximately 5 years.  Ovaries intact: yes.  Hysterectomy: yes.  Menopausal status: postmenopausal.  HRT use: 0 years. Colonoscopy: Cologuard; normal. Mammogram within the last year: yes. Number of breast biopsies: 0. Up to date with pelvic exams: yes. Any excessive radiation exposure in the past: no  Past Medical History:  Diagnosis Date  . Arthritis   . Asthma   . Family history of breast cancer   . Fibroid   . Frequent headaches   . GERD (gastroesophageal reflux disease)   . Gluten intolerance   . H/O seasonal allergies   . History of chicken pox   . History of COVID-19 07/2019  . Left bundle branch block 02/2019   Dr.Paula Ross  . Migraines    . Thyroid disease    Multiple benign nodules--thyroid removed    Past Surgical History:  Procedure Laterality Date  . CARDIAC CATHETERIZATION    . CHOLECYSTECTOMY  1995  . [place]  2009  . HERNIA REPAIR  2015   Has abdominal wall mesh - [place], [place].  Laparoscopic incisional hernia repari with 7 x 9 inch Ventralight ST with Echo mesh  . LAPAROSCOPIC HYSTERECTOMY  2007   Supracervical hysterectomy with [place]osctopy [place]  . THYROID SURGERY      Social History   Socioeconomic History  . Marital status: Married    Spouse name: Not on file  . Number of children: Not on file  . Years of education: Not on file  . Highest education level: Not on file  Occupational History  . Not on file  Tobacco Use  . Smoking status: Never Smoker  . Smokeless tobacco: Never Used  Substance and Sexual Activity  . Alcohol use: Yes    Comment: occ  . Drug use: Never  . Sexual activity: Yes    Partners: Male    Birth control/protection: Surgical    Comment: Hyst-ovaries remain  Other Topics Concern  . Not on file  Social History Narrative  . Not on file   Social Determinants of Health   Financial Resource Strain:   .  Difficulty of Paying Living Expenses:   Food Insecurity:   . Worried About Charity fundraiser in the Last Year:   . Arboriculturist in the Last Year:   Transportation Needs:   . Film/video editor (Medical):   Marland Kitchen Lack of Transportation (Non-Medical):   Physical Activity:   . Days of Exercise per Week:   . Minutes of Exercise per Session:   Stress:   . Feeling of Stress :   Social Connections:   . Frequency of Communication with Friends and Family:   . Frequency of Social Gatherings with Friends and Family:   . Attends Religious Services:   . Active Member of Clubs or Organizations:   . Attends Archivist Meetings:   Marland Kitchen Marital Status:      FAMILY HISTORY:  We obtained a detailed, 4-generation family history.  Significant  diagnoses are listed below: Family History  Problem Relation Age of Onset  . Arthritis Mother   . Cancer Mother 72       breast ca--dec age 43  . Miscarriages / [place] Mother   . Breast cancer Mother 10  . Diabetes Father   . Heart attack Father   . Heart disease Father   . Hyperlipidemia Father   . Hypertension Father   . Stroke Father        x4  . COPD Sister   . Peripheral Artery Disease Sister   . Asthma Brother   . Birth defects Brother   . Cancer Brother        skin  . Hyperlipidemia Brother   . Heart attack Maternal Grandmother   . Heart disease Maternal Grandmother   . Hyperlipidemia Maternal Grandmother   . Stroke Maternal Grandmother   . Alcohol abuse Paternal Grandmother   . Cancer Paternal Grandmother        colon  . Cancer Paternal Grandfather        breast  . Heart attack Paternal Grandfather   . Breast cancer Paternal Grandfather 36  . Alcohol abuse Brother   . Early death Brother        suicide  . Mental illness Brother   . Asthma Daughter   . Asthma Son   . Asthma Son   . Breast cancer Maternal Aunt 60  . Kidney cancer Paternal Aunt 85  . Breast cancer Paternal Aunt 24  . Throat cancer Paternal Uncle   . Thyroid cancer Paternal Uncle   . Breast cancer Cousin 14  . Prostate cancer Cousin    Ms. Keeler has 2 sons and 1 daughter. She had 2 brothers and 1 sister. One of her brothers has had melanoma on his arm at 27, he is a Air cabin crew and has sun exposure. Her sister had genetic testing 15 years ago that was negative.  Ms. Bebeau mother was diagnosed with breast cancer at 44. This became metastatic, although patient notes there was debate whether it was metastatic breast or separate colon primary, and passed at 56. Patient has 11 maternal aunts/uncles. One aunt had breast cancer and died at 34. A maternal cousin had breast cancer at 89. Another maternal cousin has prostate cancer. Maternal grandparents passed in their 50s.  Ms. Ode father  is living at 57. Patient has 2 paternal uncles, 1 paternal aunt. Her uncle had throat and thyroid cancer, he works in [place] in [place]. Her aunt had breast cancer at 69 and kidney cancer at 39. Patient's grandfather had breast cancer at  32. Grandmother had colon cancer and died at 50.   Ms. Samad is aware of previous family history of genetic testing for hereditary cancer risks. Patient's maternal ancestors are of [place] descent, and paternal ancestors are of [place] descent. There is no reported Ashkenazi Jewish ancestry. There is no known consanguinity.  GENETIC COUNSELING ASSESSMENT: Ms. Wrenn is a 54 y.o. female with a family history of cancer which is somewhat suggestive of a hereditary cancer syndrome and predisposition to cancer. We, therefore, discussed and recommended the following at today's visit.   DISCUSSION: We discussed that 5-10% of breast cancer is hereditary, with most cases associated with BRCA1/BRCA2 mutations.  There are other genes that can be associated with hereditary breast cancer syndromes.  These include PALB2, ATM, CHEK2.   We discussed that testing is beneficial for several reasons including knowing how to follow individuals for cancer screenings and understand if other family members could be at risk for cancer and allow them to undergo genetic testing.   We reviewed the characteristics, features and inheritance patterns of hereditary cancer syndromes. We also discussed genetic testing, including the appropriate family members to test, the process of testing, insurance coverage and turn-around-time for results. We discussed the implications of a negative, positive and/or variant of uncertain significant result. We recommended Ms. Raul Del pursue genetic testing for the [facility] Multi-Cancer Panel gene panel.   The Multi-Cancer Panel offered by [facility] includes sequencing and/or deletion duplication testing of the following 85 genes: AIP, ALK, APC, ATM, AXIN2,BAP1,   BARD1, BLM, BMPR1A, BRCA1, BRCA2, BRIP1, CASR, CDC73, CDH1, CDK4, CDKN1B, CDKN1C, CDKN2A (p14ARF), CDKN2A (p16INK4a), CEBPA, CHEK2, CTNNA1, DICER1, DIS3L2, EGFR (c.2369C>T, p.Thr790Met variant only), EPCAM (Deletion/duplication testing only), FH, FLCN, GATA2, GPC3, GREM1 (Promoter region deletion/duplication testing only), HOXB13 (c.251G>A, p.Gly84Glu), HRAS, KIT, MAX, MEN1, MET, MITF (c.952G>A, p.Glu318Lys variant only), MLH1, MSH2, MSH3, MSH6, MUTYH, NBN, NF1, NF2, NTHL1, PALB2, PDGFRA, PHOX2B, PMS2, POLD1, POLE, POT1, PRKAR1A, PTCH1, PTEN, RAD50, RAD51C, RAD51D, RB1, RECQL4, RET, RNF43, RUNX1, SDHAF2, SDHA (sequence changes only), SDHB, SDHC, SDHD, SMAD4, SMARCA4, SMARCB1, SMARCE1, STK11, SUFU, TERC, TERT, TMEM127, TP53, TSC1, TSC2, VHL, WRN and WT1.   Based on Ms. Hedberg's family history of cancer, she meets medical criteria for genetic testing. Despite that she meets criteria, she may still have an out of pocket cost. We discussed that if her out of pocket cost for testing is over $100, the laboratory will call and confirm whether she wants to proceed with testing.  If the out of pocket cost of testing is less than $100 she will be billed by the genetic testing laboratory.   Based on the patient's family history, a statistical model Air cabin crew) was used to estimate her risk of developing breast cancer. This estimates her lifetime risk of developing breast cancer to be approximately 14.9%. This estimation is in the setting of negative genetic test results and may change once these come back. The patient's lifetime breast cancer risk is a preliminary estimate based on available information using one of several models endorsed by the [place] (ACS). The ACS recommends consideration of breast MRI screening as an adjunct to mammography for patients at high risk (defined as 20% or greater lifetime risk).      PLAN: After considering the risks, benefits, and limitations, Ms. Hughley  provided informed consent to pursue genetic testing and the blood sample was sent to [facility] for analysis of the Multi-Cancer Panel. Results should be available within approximately 2-3 weeks' time, at which point they  will be disclosed by telephone to Ms. Feng, as will any additional recommendations warranted by these results. Ms. Barletta will receive a summary of her genetic counseling visit and a copy of her results once available. This information will also be available in Epic.   Based on Ms. Borgmeyer's family history, we recommended her sister (and any maternal/paternal relatives who have had cancer or are interested) have genetic counseling and testing. Ms. Grunwald will let us know if we can be of any assistance in coordinating genetic counseling and/or testing for this family member.   Lastly, we encouraged Ms. Judy to remain in contact with cancer genetics annually so that we can continuously update the family history and inform her of any changes in cancer genetics and testing that may be of benefit for this family.   Ms. Gwinner questions were answered to her satisfaction today. Our contact information was provided should additional questions or concerns arise. Thank you for the referral and allowing [place] to share in the care of your patient.   Faith Rogue, MS, [facility] Genetic Counselor [email or web address] Phone: [phone]  The patient was seen for a total of 25 minutes in face-to-face genetic counseling.  Drs. Magrinat, Lindi Adie and/or Burr Medico were available for discussion regarding this case.   _______________________________________________________________________ For Office Staff:  Number of people involved in session: 1 Was an Intern/ student involved with case: no

## 2020-02-09 NOTE — Telephone Encounter (Signed)
Attempted to contact patient and left message on machine to advise patient she is due to update labs in June 2021.

## 2020-02-09 NOTE — Telephone Encounter (Signed)
Last Visit: 12/30/2019 Next Visit: 06/01/2020 Labs:11/28/2019 AST 39, ALT 37  Okay to refill rinvoq?

## 2020-02-09 NOTE — Telephone Encounter (Signed)
[place].  Patient should get her labs in June.

## 2020-02-10 ENCOUNTER — Telehealth: Payer: Self-pay | Admitting: Obstetrics and Gynecology

## 2020-02-10 ENCOUNTER — Inpatient Hospital Stay: Payer: 59 | Attending: Genetic Counselor

## 2020-02-10 ENCOUNTER — Other Ambulatory Visit: Payer: Self-pay

## 2020-02-10 NOTE — Telephone Encounter (Signed)
Spoke with patient regarding surgery benefits. Patient acknowledges understanding of information presented. Patient is aware that benefits presented are for professional benefits only. Patient is aware that once surgery is scheduled, the hospital will call with separate benefits. Patient is aware of surgery cancellation policy.  Patient is ready to proceed with surgery scheduling and is requesting surgery date of 06/07/2020.

## 2020-02-11 NOTE — Telephone Encounter (Signed)
Attempted to reach patient x 2 at number provided. There was no answer and no voicemail available to leave a message.

## 2020-02-18 ENCOUNTER — Ambulatory Visit: Payer: Self-pay | Admitting: Licensed Clinical Social Worker

## 2020-02-18 ENCOUNTER — Encounter: Payer: Self-pay | Admitting: Licensed Clinical Social Worker

## 2020-02-18 ENCOUNTER — Telehealth: Payer: Self-pay | Admitting: Licensed Clinical Social Worker

## 2020-02-18 DIAGNOSIS — Z1379 Encounter for other screening for genetic and chromosomal anomalies: Secondary | ICD-10-CM | POA: Insufficient documentation

## 2020-02-18 DIAGNOSIS — Z803 Family history of malignant neoplasm of breast: Secondary | ICD-10-CM

## 2020-02-18 NOTE — Telephone Encounter (Signed)
Attempted to reach patient x 2 at number provided. There was no answer and no voicemail available to leave a message.

## 2020-02-18 NOTE — Telephone Encounter (Signed)
Revealed negative genetic testing.  Revealed that a VUS in RECQL4 was identified. This normal result is reassuring.  It is unlikely that there is an increased risk of  cancer due to a mutation in one of these genes.  However, genetic testing is not perfect, and cannot definitively rule out a hereditary cause.  It will be important for her to keep in contact with genetics to learn if any additional testing may be needed in the future.     

## 2020-02-18 NOTE — Progress Notes (Signed)
HPI:  [name] was previously seen in the [facility] [place] clinic due to a family history of cancer and concerns regarding a hereditary predisposition to cancer. Please refer to our prior cancer genetics clinic note for more information regarding our discussion, assessment and recommendations, at the time. [name] recent genetic test results were disclosed to her, as were recommendations warranted by these results. These results and recommendations are discussed in more detail below.  CANCER HISTORY:  Oncology History   No history exists.    FAMILY HISTORY:  We obtained a detailed, 4-generation family history.  Significant diagnoses are listed below: Family History  Problem Relation Age of Onset  . Arthritis Mother   . Cancer Mother 12       breast ca--dec age 24  . Miscarriages / [place] Mother   . Breast cancer Mother 13  . Diabetes Father   . Heart attack Father   . Heart disease Father   . Hyperlipidemia Father   . Hypertension Father   . Stroke Father        x4  . COPD Sister   . Peripheral Artery Disease Sister   . Asthma Brother   . Birth defects Brother   . Cancer Brother        skin  . Hyperlipidemia Brother   . Heart attack Maternal Grandmother   . Heart disease Maternal Grandmother   . Hyperlipidemia Maternal Grandmother   . Stroke Maternal Grandmother   . Alcohol abuse Paternal Grandmother   . Cancer Paternal Grandmother        colon  . Cancer Paternal Grandfather        breast  . Heart attack Paternal Grandfather   . Breast cancer Paternal Grandfather 77  . Alcohol abuse Brother   . Early death Brother        suicide  . Mental illness Brother   . Asthma Daughter   . Asthma Son   . Asthma Son   . Breast cancer Maternal Aunt 60  . Kidney cancer Paternal Aunt 85  . Breast cancer Paternal Aunt 47  . Throat cancer Paternal Uncle   . Thyroid cancer Paternal Uncle   . Breast cancer Cousin 10  . Prostate cancer Cousin     Ms.  Hudson has 2 sons and 1 daughter. She had 2 brothers and 1 sister. One of her brothers has had melanoma on his arm at 34, he is a Air cabin crew and has sun exposure. Her sister had genetic testing 15 years ago that was negative.  [name] mother was diagnosed with breast cancer at 94. This became metastatic, although patient notes there was debate whether it was metastatic breast or separate colon primary, and passed at 72. Patient has 11 maternal aunts/uncles. One aunt had breast cancer and died at 79. A maternal cousin had breast cancer at 30. Another maternal cousin has prostate cancer. Maternal grandparents passed in their 64s.  [name] father is living at 102. Patient has 2 paternal uncles, 1 paternal aunt. Her uncle had throat and thyroid cancer, he works in [place] in [place]. Her aunt had breast cancer at 24 and kidney cancer at 33. Patient's grandfather had breast cancer at 75. Grandmother had colon cancer and died at 81.   [name] is aware of previous family history of genetic testing for hereditary cancer risks. Patient's maternal ancestors are of [place] descent, and paternal ancestors are of [place] descent. There is no reported Ashkenazi Jewish ancestry. There  is no known consanguinity.  GENETIC TEST RESULTS: Genetic testing reported out on 02/18/2020 through the Multi- cancer panel found no pathogenic mutations.   The Multi-Cancer Panel offered by [facility] includes sequencing and/or deletion duplication testing of the following 85 genes: AIP, ALK, APC, ATM, AXIN2,BAP1,  BARD1, BLM, BMPR1A, BRCA1, BRCA2, BRIP1, CASR, CDC73, CDH1, CDK4, CDKN1B, CDKN1C, CDKN2A (p14ARF), CDKN2A (p16INK4a), CEBPA, CHEK2, CTNNA1, DICER1, DIS3L2, EGFR (c.2369C>T, p.Thr790Met variant only), EPCAM (Deletion/duplication testing only), FH, FLCN, GATA2, GPC3, GREM1 (Promoter region deletion/duplication testing only), HOXB13 (c.251G>A, p.Gly84Glu), HRAS, KIT, MAX, MEN1, MET, MITF (c.952G>A, p.Glu318Lys  variant only), MLH1, MSH2, MSH3, MSH6, MUTYH, NBN, NF1, NF2, NTHL1, PALB2, PDGFRA, PHOX2B, PMS2, POLD1, POLE, POT1, PRKAR1A, PTCH1, PTEN, RAD50, RAD51C, RAD51D, RB1, RECQL4, RET, RNF43, RUNX1, SDHAF2, SDHA (sequence changes only), SDHB, SDHC, SDHD, SMAD4, SMARCA4, SMARCB1, SMARCE1, STK11, SUFU, TERC, TERT, TMEM127, TP53, TSC1, TSC2, VHL, WRN and WT1.   The test report has been scanned into EPIC and is located under the Molecular Pathology section of the Results Review tab.  A portion of the result report is included below for reference.     We discussed with [name] that because current genetic testing is not perfect, it is possible there may be a gene mutation in one of these genes that current testing cannot detect, but that chance is small.  We also discussed, that there could be another gene that has not yet been discovered, or that we have not yet tested, that is responsible for the cancer diagnoses in the family. It is also possible there is a hereditary cause for the cancer in the family that [name] did not inherit and therefore was not identified in her testing.  Therefore, it is important to remain in touch with cancer genetics in the future so that we can continue to offer [name] the most up to date genetic testing.   Genetic testing did identify a variant of uncertain significance (VUS) was identified in the [facility] gene called c.3418C>T. At this time, it is unknown if this variant is associated with increased cancer risk or if this is a normal finding, but most variants such as this get reclassified to being inconsequential. It should not be used to make medical management decisions. With time, we suspect the lab will determine the significance of this variant, if any. If we do learn more about it, we will try to contact [name] to discuss it further. However, it is important to stay in touch with [place] periodically and keep the address and phone number up to date.  ADDITIONAL  GENETIC TESTING: We discussed with [name] that her genetic testing was fairly extensive.  If there are genes identified to increase cancer risk that can be analyzed in the future, we would be happy to discuss and coordinate this testing at that time.    CANCER SCREENING RECOMMENDATIONS: Ms. Hillenburg test result is considered negative (normal).  This means that we have not identified a hereditary cause for her  family history of cancer at this time.   While reassuring, this does not definitively rule out a hereditary predisposition to cancer. It is still possible that there could be genetic mutations that are undetectable by current technology. There could be genetic mutations in genes that have not been tested or identified to increase cancer risk.  Therefore, it is recommended she continue to follow the cancer management and screening guidelines provided by her  primary healthcare provider.   An individual's cancer risk and medical  management are not determined by genetic test results alone. Overall cancer risk assessment incorporates additional factors, including personal medical history, family history, and any available genetic information that may result in a personalized plan for cancer prevention and surveillance.  Based on the patient's family history, a statistical model Air cabin crew) was used to estimate her risk of developing breast cancer. This estimates her lifetime risk of developing breast cancer to be approximately 14.9%. This estimation is in the setting of her negative genetic test results. The patient's lifetime breast cancer risk is a preliminary estimate based on available information using one of several models endorsed by the [place] (ACS). The ACS recommends consideration of breast MRI screening as an adjunct to mammography for patients at high risk (defined as 20% or greater lifetime risk).     RECOMMENDATIONS FOR FAMILY MEMBERS:  Relatives in this family  might be at some increased risk of developing cancer, over the general population risk, simply due to the family history of cancer.  We recommended female relatives in this family have a yearly mammogram beginning at age 69, or 54 years younger than the earliest onset of cancer, an annual clinical breast exam, and perform monthly breast self-exams. Female relatives in this family should also have a gynecological exam as recommended by their primary provider. All family members should have a colonoscopy by age 37, or as directed by their physicians.  It is also possible there is a hereditary cause for the cancer in Ms. Champoux's family that she did not inherit and therefore was not identified in her.  Based on Ms. Manfre's family history, we recommended her maternal relatives, and paternal relatives could consider having genetic counseling and testing. Ms. Jolly will let us know if we can be of any assistance in coordinating genetic counseling and/or testing for these family members.  FOLLOW-UP: Lastly, we discussed with Ms. Baldwin that cancer genetics is a rapidly advancing field and it is possible that new genetic tests will be appropriate for her and/or her family members in the future. We encouraged her to remain in contact with cancer genetics on an annual basis so we can update her personal and family histories and let her know of advances in cancer genetics that may benefit this family.   Our contact number was provided. Ms. Bristow questions were answered to her satisfaction, and she knows she is welcome to call us at anytime with additional questions or concerns.   Faith Rogue, MS, [facility] Genetic Counselor [email or web address] Phone: [phone]

## 2020-02-25 ENCOUNTER — Telehealth: Payer: Self-pay | Admitting: Rheumatology

## 2020-02-25 NOTE — Telephone Encounter (Signed)
Caitlin from Dr. Elza Rafter office called stating patient is scheduled for surgery on 06/07/20 and requesting a return call to let them know when patient should discontinue taking Rinvoq and Plaquenil.  Please call back her direct line [phone]

## 2020-02-25 NOTE — Telephone Encounter (Signed)
I spoke with Dr. Estanislado Pandy, and she advised the patient to hold Rinvoq for 1 week prior to surgery.  She will require clearance by her surgeon prior to resuming rinvoq.  She can continue taking plaquenil as prescribed.

## 2020-02-25 NOTE — Telephone Encounter (Addendum)
Spoke with patient. Patient would like to proceed with surgery on 06/07/2020. Surgery scheduled for 06/07/2020 at 0730 at [facility]. Pre op scheduled for 05/19/2020 at 11:30 am with Dr.Silva. COVID test scheduled for 06/03/2020 at 3 pm at [facility] location. Patient is aware of the need to quarantine after test until surgery. 1 week post op scheduled for 06/14/2020 at 2:30 pm with Dr.Silva. 6 week post op scheduled for 07/20/2020 at 2:30 pm with Dr.Silva. Patient is agreeable to all dates and times. Surgery instructions reviewed and mailed to verified home address on file.  Call to [place] office to check on patient taking Plaquenil and Rinvoq.

## 2020-02-26 NOTE — Telephone Encounter (Signed)
Unable to reach patient at number provided. No voicemail available.  Will need to move surgery to 06/14/2020 due to scheduled OR days with [facility] and [facility].  Spoke with Seth Bake at [facility] office who states that the patient will need to stop Rinvoq 1 week before surgery and cannot restart until she is seen for her post op and there are no signs of infection. If no signs of infection okay to restart. Patient does not have to discontinue Plaquenil before surgery.

## 2020-02-26 NOTE — Telephone Encounter (Signed)
Returned call to Brent. Advised Caitlin per Dr. Estanislado Pandy the patient should hold Rinvoq for 1 week prior to surgery.  She will require clearance by her surgeon prior to resuming rinvoq.  She can continue taking plaquenil as prescribed.

## 2020-03-02 NOTE — Telephone Encounter (Signed)
Spoke with patient. Surgery moved to 06/14/2020 at 0730 at [facility]. Pre op 05/19/2020 at 11:30 am with Dr.Silva. COVID test scheduled for 06/10/2020 at 3:15 pm at [facility] location. Patient is aware of the need to quarantine after test until surgery. 1 week post op scheduled for 06/21/2020 at 9 am with Dr.Silva. 6 week post op scheduled for 07/26/2020 at 2 pm. Patient is agreeable to all dates and times of appointments. Surgery instructions reviewed and patient would like to pick up at her pre op. Advised of message from Seth Bake at [facility] office as seen below regarding medications. Patient verbalizes understanding.  Routing to provider and will close encounter.

## 2020-03-07 ENCOUNTER — Other Ambulatory Visit: Payer: Self-pay | Admitting: Family Medicine

## 2020-03-29 ENCOUNTER — Other Ambulatory Visit: Payer: Self-pay

## 2020-03-29 DIAGNOSIS — Z79899 Other long term (current) drug therapy: Secondary | ICD-10-CM

## 2020-03-30 LAB — COMPLETE METABOLIC PANEL WITH GFR
AG Ratio: 1.9 (calc) (ref 1.0–2.5)
ALT: 35 U/L — ABNORMAL HIGH (ref 6–29)
AST: 41 U/L — ABNORMAL HIGH (ref 10–35)
Albumin: 4.3 g/dL (ref 3.6–5.1)
Alkaline phosphatase (APISO): 52 U/L (ref 37–153)
BUN: 16 mg/dL (ref 7–25)
CO2: 28 mmol/L (ref 20–32)
Calcium: 9.7 mg/dL (ref 8.6–10.4)
Chloride: 103 mmol/L (ref 98–110)
Creat: 0.81 mg/dL (ref 0.50–1.05)
GFR, Est African American: 97 mL/min/{1.73_m2} (ref >=60)
GFR, Est Non African American: 84 mL/min/{1.73_m2} (ref >=60)
Globulin: 2.3 g/dL (calc) (ref 1.9–3.7)
Glucose, Bld: 83 mg/dL (ref 65–99)
Potassium: 3.9 mmol/L (ref 3.5–5.3)
Sodium: 138 mmol/L (ref 135–146)
Total Bilirubin: 0.9 mg/dL (ref 0.2–1.2)
Total Protein: 6.6 g/dL (ref 6.1–8.1)

## 2020-03-30 LAB — CBC WITH DIFFERENTIAL/PLATELET
Absolute Monocytes: 292 cells/uL (ref 200–950)
Basophils Absolute: 32 cells/uL (ref 0–200)
Basophils Relative: 0.6 %
Eosinophils Absolute: 70 cells/uL (ref 15–500)
Eosinophils Relative: 1.3 %
HCT: 39.9 % (ref 35.0–45.0)
Hemoglobin: 13.4 g/dL (ref 11.7–15.5)
Lymphs Abs: 2533 cells/uL (ref 850–3900)
MCH: 30.4 pg (ref 27.0–33.0)
MCHC: 33.6 g/dL (ref 32.0–36.0)
MCV: 90.5 fL (ref 80.0–100.0)
MPV: 10.8 fL (ref 7.5–12.5)
Monocytes Relative: 5.4 %
Neutro Abs: 2473 cells/uL (ref 1500–7800)
Neutrophils Relative %: 45.8 %
Platelets: 281 10*3/uL (ref 140–400)
RBC: 4.41 10*6/uL (ref 3.80–5.10)
RDW: 12.6 % (ref 11.0–15.0)
Total Lymphocyte: 46.9 %
WBC: 5.4 10*3/uL (ref 3.8–10.8)

## 2020-03-30 NOTE — Progress Notes (Signed)
Labs are stable.  LFTs are mildly elevated, most likely due to statin use.

## 2020-05-10 ENCOUNTER — Encounter: Payer: Self-pay | Admitting: Rheumatology

## 2020-05-12 ENCOUNTER — Telehealth: Payer: Self-pay | Admitting: Rheumatology

## 2020-05-12 NOTE — Telephone Encounter (Signed)
Patient advised Dr. Estanislado Pandy and Lovena Le recommend that the patient schedule an appointment with her PCP for further evaluation since it is is unlikely that the symptoms she is experiencing are due to rinvoq. Patient verbalized understanding.

## 2020-05-12 NOTE — Telephone Encounter (Signed)
Rinvoq is not associated with adverse effects of sweating or bruising.  Rinvoq is associated with anemia but patient states PCP checked lab work and within normal limits.  Based on patient age and reported sweating possible patient is in menopause.

## 2020-05-12 NOTE — Telephone Encounter (Signed)
Patient called stating she has been experiencing night sweats for the past 2 weeks.  Patient states she sleeps with an ice pack and still has approximately 8-10 times a night where she has to change her clothes.  Patient states she is also experiencing a lot of bruising.  Patient states her PCP checked her labwork which was normal.  Patient states she has approximately 20 bruises on her legs alone.  Patient is requesting a return call to let her know if any of these symptoms are due to the Rinvoq.

## 2020-05-12 NOTE — Telephone Encounter (Signed)
Dr. Estanislado Pandy and I recommend that the patient schedule an appointment with her PCP for further evaluation since it is is unlikely that the symptoms she is experiencing are due to rinvoq.  Lab work from 03/29/20 were reviewed today.

## 2020-05-14 ENCOUNTER — Other Ambulatory Visit: Payer: Self-pay

## 2020-05-14 ENCOUNTER — Encounter: Payer: Self-pay | Admitting: Family Medicine

## 2020-05-14 ENCOUNTER — Ambulatory Visit: Payer: No Typology Code available for payment source | Admitting: Family Medicine

## 2020-05-14 VITALS — BP 120/86 | HR 80 | Temp 98.3°F | Wt 184.0 lb

## 2020-05-14 DIAGNOSIS — R238 Other skin changes: Secondary | ICD-10-CM

## 2020-05-14 DIAGNOSIS — R61 Generalized hyperhidrosis: Secondary | ICD-10-CM

## 2020-05-14 DIAGNOSIS — R233 Spontaneous ecchymoses: Secondary | ICD-10-CM

## 2020-05-14 MED ORDER — GABAPENTIN 800 MG PO TABS: 800.0000 mg | ORAL_TABLET | Freq: Every day | ORAL | 1 refills | Status: DC

## 2020-05-14 NOTE — Progress Notes (Signed)
Patient: [name] MRN: [id number] DOB: 01-25-1966 PCP: Orma Flaming, MD     Subjective:  Chief Complaint  Patient presents with  . Night Sweats    experienced several months ago and went away, recently started again - rarely sleeping through the night 8-10 episodes nightly  . bruises    all over body, some present for over 6 months - not going away     HPI: The patient is a 54 y.o. female who presents today for excessive sweating and easy bruising.   Easy bruising First noticed beginning of the year 2020. She started rinvoq in 01/2020. She can not recall what she does to have the bruises and some stay on her skin for months at a time. Some are painful. She thinks the bruising are disproportionate to what she does. Majority of her bruises are on her legs/arms. She does have a few on her trunk that are more yellow in color. multiple dark areas on her legs. She denies any bleeding from anywhere else. No family history of blood disorders that she is aware of.   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 She states it is really bad. It is worse at night and she is not sleeping at all. She has a cooling blanket, ice packs, very cold setting, no clothes and is still hot. Denies any fevers. She has had a hysterectomy. She goes next Wednesday to see her gyn.    Review of Systems  Constitutional: Positive for diaphoresis. Negative for chills, fatigue, fever and unexpected weight change.  Respiratory: Negative for cough, shortness of breath and wheezing.   Cardiovascular: Negative for chest pain, palpitations and leg swelling.  Gastrointestinal: Negative for abdominal pain, constipation, diarrhea, nausea and vomiting.  Musculoskeletal: Positive for arthralgias.  Hematological: Negative for adenopathy. Bruises/bleeds easily.    Allergies Patient is allergic to orencia [abatacept], shellfish  allergy, gluten meal, lortab [hydrocodone-acetaminophen], morphine and related, and strawberry (diagnostic).  Past Medical History Patient  has a past medical history of Arthritis, Asthma, Family history of breast cancer, Fibroid, Frequent headaches, GERD (gastroesophageal reflux disease), Gluten intolerance, H/O seasonal allergies, History of chicken pox, History of COVID-19 (07/2019), Left bundle branch block (02/2019), Migraines, and Thyroid disease.  Surgical History Patient  has a past surgical history that includes Cholecystectomy (1995); Laparoscopic hysterectomy (2007); Thyroid surgery; Cardiac catheterization; Hammer toe surgery (2009); and Hernia repair (2015).  Family History Pateint's family history includes Alcohol abuse in her brother and paternal grandmother; Arthritis in her mother; Asthma in her brother, daughter, son, and son; Birth defects in her brother; Breast cancer (age of onset: 25) in her mother; Breast cancer (age of onset: 24) in her cousin; Breast cancer (age of onset: 2) in her maternal aunt and paternal grandfather; Breast cancer (age of onset: 44) in her paternal aunt; COPD in her sister; Cancer in her brother, paternal grandfather, and paternal grandmother; Cancer (age of onset: 96) in her mother; Diabetes in her father; Early death in her brother; Heart attack in her father, maternal grandmother, and paternal grandfather; Heart disease in her father and maternal grandmother; Hyperlipidemia in her brother, father, and maternal grandmother; Hypertension in her father; Kidney cancer (age of onset: 36) in her paternal aunt; Mental illness in her brother; Miscarriages / Stillbirths in her mother; Peripheral Artery Disease in her sister; Prostate cancer in her cousin; Stroke in her father and maternal grandmother; Throat  cancer in her paternal uncle; Thyroid cancer in her paternal uncle.  Social History Patient  reports that she has never smoked. She has never used smokeless  tobacco. She reports current alcohol use. She reports that she does not use drugs.    Objective: Vitals:   05/14/20 1321  BP: 120/86  Pulse: 80  Temp: 98.3 F (36.8 C)  TempSrc: Temporal  SpO2: 98%  Weight: 184 lb (83.5 kg)    Body mass index is 30.39 kg/m.  Physical Exam Vitals reviewed.  Constitutional:      Appearance: Normal appearance.  HENT:     Head: Normocephalic and atraumatic.     Right Ear: Tympanic membrane, ear canal and external ear normal.     Left Ear: Tympanic membrane, ear canal and external ear normal.  Cardiovascular:     Rate and Rhythm: Normal rate and regular rhythm.     Heart sounds: Normal heart sounds.  Pulmonary:     Effort: Pulmonary effort is normal.     Breath sounds: Normal breath sounds.  Abdominal:     General: Abdomen is flat. Bowel sounds are normal.     Palpations: Abdomen is soft.  Musculoskeletal:     Cervical back: Normal range of motion and neck supple.  Lymphadenopathy:     Cervical: No cervical adenopathy.     Upper Body:     Right upper body: No supraclavicular or axillary adenopathy.     Left upper body: No supraclavicular or axillary adenopathy.     Lower Body: No right inguinal adenopathy. No left inguinal adenopathy.  Skin:    Capillary Refill: Capillary refill takes less than 2 seconds.     Findings: Bruising present.     Comments: Multiple bruises everywhere, mainly on arms/legs. Has a few yellow bruises on anterior chest/back. No petechiae  Neurological:     General: No focal deficit present.     Mental Status: She is alert and oriented to person, place, and time.  Psychiatric:        Mood and Affect: Mood normal.        Behavior: Behavior normal.        Assessment/plan: 1. Easy bruising She does have quite significant bruising and her cbc's have been normal. Only other systemic symptom is night sweats and could just be hormonal or something more significant. Will check labs today and refer to heme since  nothing abnormal has been found and she does have significant findings.  - CBC with Differential/Platelet; Future - TSH; Future - COMPLETE METABOLIC PANEL WITH GFR; Future - Protime-INR; Future - Ambulatory referral to Hematology - Protime-INR - COMPLETE METABOLIC PANEL WITH GFR - TSH - CBC with Differential/Platelet  2. Night sweats Seeing gyn next week. Work up above and increasing her gabapentin to 800mg  qhs.    This visit occurred during the SARS-CoV-2 public health emergency.  Safety protocols were in place, including screening questions prior to the visit, additional usage of staff PPE, and extensive cleaning of exam room while observing appropriate contact time as indicated for disinfecting solutions.     Return if symptoms worsen or fail to improve.     Orma Flaming, MD [place]  05/14/2020

## 2020-05-14 NOTE — Patient Instructions (Signed)
-  recheck labs -referral to heme ( they will call you with this)  -ask gyn about possible HRT if you want this for hot flashes... increasing gabapentin to 800mg . Sent in new px.    Keep me posted!  aw

## 2020-05-14 NOTE — Progress Notes (Deleted)
Acute Office Visit  Subjective:    Patient ID: [name], female    DOB: 05-01-1966, 54 y.o.   MRN: [id number]  Chief Complaint  Patient presents with  . Night Sweats    experienced several months ago and went away, recently started again - rarely sleeping through the night 8-10 episodes nightly    HPI Patient is in today for ***  Past Medical History:  Diagnosis Date  . Arthritis   . Asthma   . Family history of breast cancer   . Fibroid   . Frequent headaches   . GERD (gastroesophageal reflux disease)   . Gluten intolerance   . H/O seasonal allergies   . History of chicken pox   . History of COVID-19 07/2019  . Left bundle branch block 02/2019   Dr.Paula Ross  . Migraines   . Thyroid disease    Multiple benign nodules--thyroid removed    Past Surgical History:  Procedure Laterality Date  . CARDIAC CATHETERIZATION    . CHOLECYSTECTOMY  1995  . Onset  2009  . HERNIA REPAIR  2015   Has abdominal wall mesh - [place], [place].  Laparoscopic incisional hernia repari with 7 x 9 inch Ventralight ST with Echo mesh  . LAPAROSCOPIC HYSTERECTOMY  2007   Supracervical hysterectomy with cystosctopy - [place], [place]  . THYROID SURGERY      Family History  Problem Relation Age of Onset  . Arthritis Mother   . Cancer Mother 43       breast ca--dec age 87  . Miscarriages / [place] Mother   . Breast cancer Mother 40  . Diabetes Father   . Heart attack Father   . Heart disease Father   . Hyperlipidemia Father   . Hypertension Father   . Stroke Father        x4  . COPD Sister   . Peripheral Artery Disease Sister   . Asthma Brother   . Birth defects Brother   . Cancer Brother        skin  . Hyperlipidemia Brother   . Heart attack Maternal Grandmother   . Heart disease Maternal Grandmother   . Hyperlipidemia Maternal Grandmother   . Stroke Maternal Grandmother   . Alcohol abuse Paternal Grandmother   . Cancer Paternal Grandmother        colon  .  Cancer Paternal Grandfather        breast  . Heart attack Paternal Grandfather   . Breast cancer Paternal Grandfather 91  . Alcohol abuse Brother   . Early death Brother        suicide  . Mental illness Brother   . Asthma Daughter   . Asthma Son   . Asthma Son   . Breast cancer Maternal Aunt 60  . Kidney cancer Paternal Aunt 85  . Breast cancer Paternal Aunt 85  . Throat cancer Paternal Uncle   . Thyroid cancer Paternal Uncle   . Breast cancer Cousin 7  . Prostate cancer Cousin     Social History   Socioeconomic History  . Marital status: Married    Spouse name: Not on file  . Number of children: Not on file  . Years of education: Not on file  . Highest education level: Not on file  Occupational History  . Not on file  Tobacco Use  . Smoking status: Never Smoker  . Smokeless tobacco: Never Used  Vaping Use  . Vaping Use: Never used  Substance and Sexual Activity  . Alcohol use: Yes    Comment: occ  . Drug use: Never  . Sexual activity: Yes    Partners: Male    Birth control/protection: Surgical    Comment: Hyst-ovaries remain  Other Topics Concern  . Not on file  Social History Narrative  . Not on file   Social Determinants of Health   Financial Resource Strain:   . Difficulty of Paying Living Expenses: Not on file  Food Insecurity:   . Worried About Charity fundraiser in the Last Year: Not on file  . Ran Out of Food in the Last Year: Not on file  Transportation Needs:   . Lack of Transportation (Medical): Not on file  . Lack of Transportation (Non-Medical): Not on file  Physical Activity:   . Days of Exercise per Week: Not on file  . Minutes of Exercise per Session: Not on file  Stress:   . Feeling of Stress : Not on file  Social Connections:   . Frequency of Communication with Friends and Family: Not on file  . Frequency of Social Gatherings with Friends and Family: Not on file  . Attends Religious Services: Not on file  . Active Member of Clubs  or Organizations: Not on file  . Attends Archivist Meetings: Not on file  . Marital Status: Not on file  Intimate Partner Violence:   . Fear of Current or Ex-Partner: Not on file  . Emotionally Abused: Not on file  . Physically Abused: Not on file  . Sexually Abused: Not on file    Outpatient Medications Prior to Visit  Medication Sig Dispense Refill  . ADVAIR DISKUS 250-50 MCG/DOSE AEPB TAKE 1 PUFF BY MOUTH TWICE A DAY 180 each 3  . albuterol (VENTOLIN HFA) 108 (90 Base) MCG/ACT inhaler TAKE 2 PUFFS BY MOUTH EVERY 6 HOURS AS NEEDED FOR WHEEZE OR SHORTNESS OF BREATH 25.5 g 5  . cholecalciferol (VITAMIN D) 400 units TABS tablet Take 5,000 Units by mouth.     . clobetasol cream (TEMOVATE) 0.05 % as needed.     . cyclobenzaprine (FLEXERIL) 10 MG tablet Take 1 tablet (10 mg total) by mouth 3 (three) times daily as needed for muscle spasms. 30 tablet 0  . Diclofenac Sodium ([name]) 2 % SOLN Place 1 application onto the skin 2 (two) times daily. 112 g 2  . fexofenadine-pseudoephedrine (ALLEGRA-D 24) 180-240 MG 24 hr tablet One tablet as needed daily for allergies  30 tablet 5  . gabapentin (NEURONTIN) 600 MG tablet Take 1 tablet (600 mg total) by mouth at bedtime. 90 tablet 3  . hydroxychloroquine (PLAQUENIL) 200 MG tablet Take 1 tablet (200 mg total) by mouth 2 (two) times daily. 180 tablet 0  . levothyroxine (SYNTHROID) 150 MCG tablet TAKE 1 TABLET BY MOUTH EVERY OTHER DAY 45 tablet 3  . montelukast (SINGULAIR) 10 MG tablet TAKE 1 TABLET BY MOUTH EVERYDAY AT BEDTIME 90 tablet 3  . RINVOQ 15 MG TB24 TAKE 1 TABLET BY MOUTH 1 TIME A DAY. 30 tablet 2  . rosuvastatin (CRESTOR) 5 MG tablet Take 0.5 tablets (2.5 mg total) by mouth daily. 90 tablet 3  . SYNTHROID 175 MCG tablet Take 175 mcg by mouth every morning.     No facility-administered medications prior to visit.    Allergies  Allergen Reactions  . Orencia [Abatacept] Hives  . Shellfish Allergy Anaphylaxis and Hives  .  Gluten Meal     Bloody diarrhea and  hives  . Lortab [Hydrocodone-Acetaminophen]     Swelling of tongue  . Morphine And Related Itching    irritable  . Strawberry (Diagnostic)     Hazelnuts, okra    Review of Systems     Objective:    Physical Exam  LMP  (LMP Unknown)  Wt Readings from Last 3 Encounters:  02/02/20 178 lb 9.6 oz (81 kg)  01/28/20 176 lb 8 oz (80.1 kg)  01/26/20 176 lb 8 oz (80.1 kg)    Health Maintenance Due  Topic Date Due  . INFLUENZA VACCINE  04/18/2020    There are no preventive care reminders to display for this patient.   Lab Results  Component Value Date   TSH 1.11 11/28/2019   Lab Results  Component Value Date   WBC 5.4 03/29/2020   HGB 13.4 03/29/2020   HCT 39.9 03/29/2020   MCV 90.5 03/29/2020   PLT 281 03/29/2020   Lab Results  Component Value Date   NA 138 03/29/2020   K 3.9 03/29/2020   CO2 28 03/29/2020   GLUCOSE 83 03/29/2020   BUN 16 03/29/2020   CREATININE 0.81 03/29/2020   BILITOT 0.9 03/29/2020   ALKPHOS 49 11/28/2019   AST 41 (H) 03/29/2020   ALT 35 (H) 03/29/2020   PROT 6.6 03/29/2020   ALBUMIN 4.6 11/28/2019   CALCIUM 9.7 03/29/2020   ANIONGAP 13 03/16/2019   GFR 65.02 11/28/2019   Lab Results  Component Value Date   CHOL 147 07/28/2019   Lab Results  Component Value Date   HDL 71 07/28/2019   Lab Results  Component Value Date   LDLCALC 63 07/28/2019   Lab Results  Component Value Date   TRIG 67 07/28/2019   Lab Results  Component Value Date   CHOLHDL 2.1 07/28/2019   Lab Results  Component Value Date   HGBA1C 5.0 11/28/2019       Assessment & Plan:   Problem List Items Addressed This Visit    None       No orders of the defined types were placed in this encounter.    Jodell Cipro, CMA

## 2020-05-15 LAB — CBC WITH DIFFERENTIAL/PLATELET
Absolute Monocytes: 449 cells/uL (ref 200–950)
Basophils Absolute: 40 cells/uL (ref 0–200)
Basophils Relative: 0.6 %
Eosinophils Absolute: 27 cells/uL (ref 15–500)
Eosinophils Relative: 0.4 %
HCT: 36.4 % (ref 35.0–45.0)
Hemoglobin: 12.6 g/dL (ref 11.7–15.5)
Lymphs Abs: 2935 cells/uL (ref 850–3900)
MCH: 30.7 pg (ref 27.0–33.0)
MCHC: 34.6 g/dL (ref 32.0–36.0)
MCV: 88.6 fL (ref 80.0–100.0)
MPV: 10.9 fL (ref 7.5–12.5)
Monocytes Relative: 6.7 %
Neutro Abs: 3250 cells/uL (ref 1500–7800)
Neutrophils Relative %: 48.5 %
Platelets: 267 10*3/uL (ref 140–400)
RBC: 4.11 10*6/uL (ref 3.80–5.10)
RDW: 12.4 % (ref 11.0–15.0)
Total Lymphocyte: 43.8 %
WBC: 6.7 10*3/uL (ref 3.8–10.8)

## 2020-05-15 LAB — COMPLETE METABOLIC PANEL WITH GFR
AG Ratio: 2.1 (calc) (ref 1.0–2.5)
ALT: 46 U/L — ABNORMAL HIGH (ref 6–29)
AST: 76 U/L — ABNORMAL HIGH (ref 10–35)
Albumin: 4.6 g/dL (ref 3.6–5.1)
Alkaline phosphatase (APISO): 46 U/L (ref 37–153)
BUN: 12 mg/dL (ref 7–25)
CO2: 29 mmol/L (ref 20–32)
Calcium: 9.4 mg/dL (ref 8.6–10.4)
Chloride: 105 mmol/L (ref 98–110)
Creat: 0.87 mg/dL (ref 0.50–1.05)
GFR, Est African American: 89 mL/min/{1.73_m2} (ref >=60)
GFR, Est Non African American: 77 mL/min/{1.73_m2} (ref >=60)
Globulin: 2.2 g/dL (calc) (ref 1.9–3.7)
Glucose, Bld: 89 mg/dL (ref 65–99)
Potassium: 4 mmol/L (ref 3.5–5.3)
Sodium: 140 mmol/L (ref 135–146)
Total Bilirubin: 0.8 mg/dL (ref 0.2–1.2)
Total Protein: 6.8 g/dL (ref 6.1–8.1)

## 2020-05-15 LAB — PROTIME-INR
INR: 0.9
Prothrombin Time: 9.8 s (ref 9.0–11.5)

## 2020-05-15 LAB — TSH: TSH: 0.77 mIU/L

## 2020-05-17 ENCOUNTER — Telehealth: Payer: Self-pay | Admitting: Oncology

## 2020-05-17 NOTE — Telephone Encounter (Signed)
Received a new hem referral from Dr. Rogers Blocker for easy bruising. Pt has been cld and scheduled to see Dr. Alen Blew on 9/16 at 11am. Pt aware to arrive 15 minutes early.

## 2020-05-18 NOTE — Progress Notes (Signed)
Office Visit Note  Patient: [name]             Date of Birth: 05/07/1966           MRN: [id number]             PCP: Orma Flaming, MD Referring: Orma Flaming, MD Visit Date: 06/01/2020 Occupation: @GUAROCC @  Subjective:  Other (numbness and tingling in hands/lower extremities)   History of Present Illness: [name] is a 54 y.o. female with history of seronegative rheumatoid arthritis.  She states she has been doing well on the combination of Plaquenil and Rinvoq.  Denies any joint swelling.  She states recently she has been noticing increased bruising all over her body.  She was seen by her PCP and had labs which were all unremarkable.  She was referred to hematology.  She has been also experiencing some tingling in her upper and lower extremities which comes and goes.  Activities of Daily Living:  Patient reports morning stiffness for  45 minutes.   Patient Reports nocturnal pain.  Difficulty dressing/grooming: Denies Difficulty climbing stairs: Denies Difficulty getting out of chair: Denies Difficulty using hands for taps, buttons, cutlery, and/or writing: Denies  Review of Systems  Constitutional: Positive for fatigue and night sweats.  HENT: Positive for mouth dryness. Negative for mouth sores and nose dryness.   Eyes: Positive for dryness.  Respiratory: Negative for shortness of breath and difficulty breathing.   Cardiovascular: Negative for chest pain and palpitations.  Gastrointestinal: Negative for blood in stool, constipation and diarrhea.  Endocrine: Negative for increased urination.  Genitourinary: Negative for difficulty urinating.  Musculoskeletal: Positive for arthralgias, joint pain and morning stiffness. Negative for joint swelling, myalgias, muscle tenderness and myalgias.  Skin: Negative for color change, rash and redness.  Allergic/Immunologic: Negative for susceptible to infections.  Neurological: Positive for dizziness, numbness, headaches,  parasthesias and night sweats. Negative for memory loss and weakness.  Hematological: Positive for bruising/bleeding tendency.  Psychiatric/Behavioral: Positive for sleep disturbance. Negative for confusion.    PMFS History:  Patient Active Problem List   Diagnosis Date Noted  . Genetic testing 02/18/2020  . Family history of breast cancer   . High risk medication use 10/30/2019  . Left bundle branch block 04/28/2019  . Right wrist pain 03/13/2019  . Hamstring strain, left, initial encounter 02/20/2019  . 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 Primary osteoarthritis of both feet 10/09/2018  . Hx of migraines 09/27/2018  . History of gastroesophageal reflux (GERD) 09/27/2018  . 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 Moderate persistent asthma 07/05/2018  . Hypothyroid 06/28/2018    Past Medical History:  Diagnosis Date  . Arthritis   . Asthma   . Family history of breast cancer   . Fibroid   . Frequent headaches   . GERD (gastroesophageal reflux disease)   . Gluten intolerance   . H/O seasonal allergies   . History of chicken pox   . History of COVID-19 07/2019  . Left bundle branch block 02/2019   Dr.Paula Ross  . Migraines   . Thyroid disease    Multiple benign nodules--thyroid removed    Family History  Problem Relation Age of Onset  . Arthritis Mother   . Cancer Mother 57       breast ca--dec age 81  . Miscarriages / [place] Mother   . Breast cancer Mother 63  . Diabetes Father   . Heart attack Father   . Heart disease Father  pacemaker   . Hyperlipidemia Father   . Hypertension Father   . Stroke Father        x4  . COPD Sister   . Peripheral Artery Disease Sister   . Asthma Brother   . Birth defects Brother   . Cancer Brother        skin  . Hyperlipidemia Brother   . Heart attack Maternal Grandmother   . Heart disease Maternal Grandmother   . Hyperlipidemia Maternal Grandmother   . Stroke Maternal Grandmother   . Alcohol  abuse Paternal Grandmother   . Cancer Paternal Grandmother        colon  . Cancer Paternal Grandfather        breast  . Heart attack Paternal Grandfather   . Breast cancer Paternal Grandfather 81  . Alcohol abuse Brother   . Early death Brother        suicide  . Mental illness Brother   . Asthma Daughter   . Asthma Son   . Asthma Son   . Breast cancer Maternal Aunt 60  . Kidney cancer Paternal Aunt 85  . Breast cancer Paternal Aunt 73  . Throat cancer Paternal Uncle   . Thyroid cancer Paternal Uncle   . Breast cancer Cousin 52  . Prostate cancer Cousin    Past Surgical History:  Procedure Laterality Date  . CARDIAC CATHETERIZATION    . CHOLECYSTECTOMY  1995  . [place]  2009  . HERNIA REPAIR  2015   Has abdominal wall mesh - [place], [place].  Laparoscopic incisional hernia repari with 7 x 9 inch Ventralight ST with Echo mesh  . LAPAROSCOPIC HYSTERECTOMY  2007   Supracervical hysterectomy with cystosctopy [place], [place]  . THYROID SURGERY     Social History   Social History Narrative  . Not on file   Immunization History  Administered Date(s) Administered  . Influenza,inj,Quad PF,6+ Mos 06/28/2018, 06/18/2019  . Influenza-Unspecified 01/25/2018  . PFIZER SARS-COV-2 Vaccination 12/08/2019, 01/05/2020  . Pneumococcal Polysaccharide-23 10/22/2018  . Tdap 06/28/2018  . Zoster Recombinat (Shingrix) 04/15/2019, 07/16/2019     Objective: Vital Signs: BP 126/81 (BP Location: Left Arm, Patient Position: Sitting, Cuff Size: Normal)   Pulse 81   Resp 14   Ht 5\' 7"  (1.702 m)   Wt 182 lb (82.6 kg)   LMP  (LMP Unknown)   BMI 28.51 kg/m    Physical Exam Vitals and nursing note reviewed.  Constitutional:      Appearance: She is well-developed.  HENT:     Head: Normocephalic and atraumatic.  Eyes:     Conjunctiva/sclera: Conjunctivae normal.  Cardiovascular:     Rate and Rhythm: Normal rate and regular rhythm.     Heart sounds: Normal heart sounds.    Pulmonary:     Effort: Pulmonary effort is normal.     Breath sounds: Normal breath sounds.  Abdominal:     General: Bowel sounds are normal.     Palpations: Abdomen is soft.  Musculoskeletal:     Cervical back: Normal range of motion.  Lymphadenopathy:     Cervical: No cervical adenopathy.  Skin:    General: Skin is warm and dry.     Capillary Refill: Capillary refill takes less than 2 seconds.     Comments: Bruising noted on both upper and lower extremities.  Neurological:     Mental Status: She is alert and oriented to person, place, and time.  Psychiatric:  Behavior: Behavior normal.      Musculoskeletal Exam: C-spine thoracic and lumbar spine with good range of motion.  Shoulder joints, elbow joints, wrist joints, MCPs PIPs and DIPs been good range of motion with no synovitis.  Hip joints, knee joints, ankles, MTPs and PIPs with good range of motion with no synovitis.  CDAI Exam: CDAI Score: 0.2  Patient Global: 1 mm; Provider Global: 1 mm Swollen: 0 ; Tender: 0  Joint Exam 06/01/2020   No joint exam has been documented for this visit   There is currently no information documented on the homunculus. Go to the Rheumatology activity and complete the homunculus joint exam.  Investigation: No additional findings.  Imaging: No results found.  Recent Labs: Lab Results  Component Value Date   WBC 6.7 05/14/2020   HGB 12.6 05/14/2020   PLT 267 05/14/2020   NA 140 05/14/2020   K 4.0 05/14/2020   CL 105 05/14/2020   CO2 29 05/14/2020   GLUCOSE 89 05/14/2020   BUN 12 05/14/2020   CREATININE 0.87 05/14/2020   BILITOT 0.8 05/14/2020   ALKPHOS 49 11/28/2019   AST 76 (H) 05/14/2020   ALT 46 (H) 05/14/2020   PROT 6.8 05/14/2020   ALBUMIN 4.6 11/28/2019   CALCIUM 9.4 05/14/2020   GFRAA 89 05/14/2020   QFTBGOLDPLUS NEGATIVE 10/14/2019    Speciality Comments: PLQ Eye Exam: 05/10/2020 [facility] Follow up in 1 year.   Inadequate response to  methotrexate, Enbrel, Humira, Orencia-hives everywhere  Procedures:  No procedures performed Allergies: Orencia [abatacept], Shellfish allergy, Gluten meal, Lortab [hydrocodone-acetaminophen], Morphine and related, and Strawberry (diagnostic)   Assessment / Plan:     Visit Diagnoses: Rheumatoid arthritis of multiple sites with negative rheumatoid factor ([place]) - Diagnosed at [place] [facility]. The ultrasound examination performed showed synovitis in bilateral hands: Patient is clinically doing very well on Plaquenil and Arava combination.  She had no synovitis on examination.  We detailed discussion regarding may be coming off Plaquenil as she has been experiencing increased bruising.  I also discussed black box warning on Rinvoq with increased risk of clotting.  Use of increased fluid intake was discussed.  She is a scheduled to have surgery next month.  Have advised her to stop Rinvoq a week prior to the surgery and then restart 1 or 2 weeks later if she had good recovery from the bladder valve surgery.  High risk medication use - Plaquenil 200 mg 1 tab BID.  Eye examination on May 10, 2020 was normal.  Starting on Rinvoq 15 mg daily. Inadequate response to Enbrel and Humira.  She developed hives on Orencia.. D/c MTX-elevated LFTs,    Primary osteoarthritis of both feet-currently not having much discomfort.  Trochanteric bursitis of both hips-she has off-and-on pain.  Stretching exercises were discussed.  Elevated LFTs-her LFTs continue to be elevated.  I will refer her to GI for evaluation.  Paresthesias-she has paresthesias in both upper and lower extremities which come and go.  We will refer her to neurology.  Bruising-increased bruising was noted recently.  Which could be related to Plaquenil although she has been on Plaquenil for many years.  Her CBC and CMP done by her PCP was normal.  She has an appointment coming up with hematologist tomorrow.  History of  asthma  History of gastroesophageal reflux (GERD)  Hx of migraines  History of hypothyroidism  Decreased cardiac ejection fraction  Educated about COVID-19 virus infection-she is fully vaccinated against COVID-19.  Use of booster was discussed.  Use of mask, social distancing and hand hygiene was emphasized.  I also discussed use of monoclonal antibodies in case she develops COVID-19 infection.  Orders: No orders of the defined types were placed in this encounter.  No orders of the defined types were placed in this encounter.    Follow-Up Instructions: Return in about 3 months (around 08/31/2020) for Rheumatoid arthritis.   Bo Merino, MD  Note - This record has been created using Editor, commissioning.  Chart creation errors have been sought, but may not always  have been located. Such creation errors do not reflect on  the standard of medical care.

## 2020-05-19 ENCOUNTER — Encounter: Payer: Self-pay | Admitting: Obstetrics and Gynecology

## 2020-05-19 ENCOUNTER — Other Ambulatory Visit: Payer: Self-pay

## 2020-05-19 ENCOUNTER — Ambulatory Visit (INDEPENDENT_AMBULATORY_CARE_PROVIDER_SITE_OTHER): Payer: No Typology Code available for payment source | Admitting: Obstetrics and Gynecology

## 2020-05-19 VITALS — BP 134/80 | HR 66 | Ht 65.25 in | Wt 185.0 lb

## 2020-05-19 DIAGNOSIS — N951 Menopausal and female climacteric states: Secondary | ICD-10-CM | POA: Diagnosis not present

## 2020-05-19 DIAGNOSIS — N393 Stress incontinence (female) (male): Secondary | ICD-10-CM

## 2020-05-19 DIAGNOSIS — N816 Rectocele: Secondary | ICD-10-CM

## 2020-05-19 DIAGNOSIS — N8110 Cystocele, unspecified: Secondary | ICD-10-CM

## 2020-05-19 MED ORDER — VENLAFAXINE HCL ER 37.5 MG PO CP24: 37.5000 mg | ORAL_CAPSULE | Freq: Every day | ORAL | 2 refills | Status: DC

## 2020-05-19 NOTE — Progress Notes (Signed)
GYNECOLOGY  VISIT   HPI: 54 y.o.   Married  Caucasian  female   [phone] with No LMP recorded (lmp unknown). Patient has had a hysterectomy.   here for surgery consult for pelvic organ prolapse and urinary stress incontinence.   Patient has first to second degree cystocele and a first to second degree rectocele. She has urinary incontinence with exercise.  Some constipation and splinting with BMs. Is sexually active.  Urodynamic testing documented stress incontinence. She has large bladder capacity of 966 cc on pressure flow study.   Patient has done physical therapy and has not improved.  She now desires surgery.  Patient reporting hot flashes and night sweats.  Having increased bruising.  Normal CBC 03/29/20, and normal PT/INR 05/14/20 ordered by her PCP.  Will see hematology on Sept 16 for bruising.  Will see cardiology on Sept. 10 for BBB.   She had genetic counseling and testing due to paternal and maternal family cancers, and her results were negative.   Had Covid vaccination.  Completed in April.  GYNECOLOGIC HISTORY: No LMP recorded (lmp unknown). Patient has had a hysterectomy. Contraception:  Hyst--ovaries remain Menopausal hormone therapy:  None Last mammogram: 11-21-19 3D/Neg/density B/BiRads1 Last pap smear: 10-16-19 Neg:Neg HR HPV, 2019 normal per patient        OB History    Gravida  5   Para  3   Term      Preterm      AB  2   Living  3     SAB  2   TAB      Ectopic      Multiple      Live Births                 Patient Active Problem List   Diagnosis Date Noted  . Genetic testing 02/18/2020  . Family history of breast cancer   . High risk medication use 10/30/2019  . Left bundle branch block 04/28/2019  . Right wrist pain 03/13/2019  . Hamstring strain, left, initial encounter 02/20/2019  . 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 Primary osteoarthritis of both feet 10/09/2018  . Hx of migraines 09/27/2018  . History of  gastroesophageal reflux (GERD) 09/27/2018  . 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 Moderate persistent asthma 07/05/2018  . Hypothyroid 06/28/2018    Past Medical History:  Diagnosis Date  . Arthritis   . Asthma   . Family history of breast cancer   . Fibroid   . Frequent headaches   . GERD (gastroesophageal reflux disease)   . Gluten intolerance   . H/O seasonal allergies   . History of chicken pox   . History of COVID-19 07/2019  . Left bundle branch block 02/2019   Dr.Paula Ross  . Migraines   . Thyroid disease    Multiple benign nodules--thyroid removed    Past Surgical History:  Procedure Laterality Date  . CARDIAC CATHETERIZATION    . CHOLECYSTECTOMY  1995  . [place]  2009  . HERNIA REPAIR  2015   Has abdominal wall mesh - [place], [place].  Laparoscopic incisional hernia repari with 7 x 9 inch Ventralight ST with Echo mesh  . LAPAROSCOPIC HYSTERECTOMY  2007   Supracervical hysterectomy with cystosctopy - [place], [place]  . THYROID SURGERY      Current Outpatient Medications  Medication Sig Dispense Refill  . ADVAIR DISKUS 250-50 MCG/DOSE AEPB TAKE 1 PUFF BY MOUTH TWICE A DAY 180 each  3  . albuterol (VENTOLIN HFA) 108 (90 Base) MCG/ACT inhaler TAKE 2 PUFFS BY MOUTH EVERY 6 HOURS AS NEEDED FOR WHEEZE OR SHORTNESS OF BREATH 25.5 g 5  . cholecalciferol (VITAMIN D) 400 units TABS tablet Take 5,000 Units by mouth.     . cyclobenzaprine (FLEXERIL) 10 MG tablet Take 1 tablet (10 mg total) by mouth 3 (three) times daily as needed for muscle spasms. 30 tablet 0  . Diclofenac Sodium ([name]) 2 % SOLN Place 1 application onto the skin 2 (two) times daily. 112 g 2  . fexofenadine-pseudoephedrine (ALLEGRA-D 24) 180-240 MG 24 hr tablet One tablet as needed daily for allergies  30 tablet 5  . gabapentin (NEURONTIN) 800 MG tablet Take 1 tablet (800 mg total) by mouth at bedtime. 90 tablet 1  . hydroxychloroquine (PLAQUENIL) 200 MG tablet Take 1 tablet (200  mg total) by mouth 2 (two) times daily. 180 tablet 0  . levothyroxine (SYNTHROID) 150 MCG tablet TAKE 1 TABLET BY MOUTH EVERY OTHER DAY 45 tablet 3  . montelukast (SINGULAIR) 10 MG tablet TAKE 1 TABLET BY MOUTH EVERYDAY AT BEDTIME 90 tablet 3  . RINVOQ 15 MG TB24 TAKE 1 TABLET BY MOUTH 1 TIME A DAY. 30 tablet 2  . SYNTHROID 175 MCG tablet Take 175 mcg by mouth every morning.    . rosuvastatin (CRESTOR) 5 MG tablet Take 0.5 tablets (2.5 mg total) by mouth daily. 90 tablet 3   No current facility-administered medications for this visit.     ALLERGIES: Orencia [abatacept], Shellfish allergy, Gluten meal, Lortab [hydrocodone-acetaminophen], Morphine and related, and Strawberry (diagnostic)  Family History  Problem Relation Age of Onset  . Arthritis Mother   . Cancer Mother 26       breast ca--dec age 84  . Miscarriages / [place] Mother   . Breast cancer Mother 80  . Diabetes Father   . Heart attack Father   . Heart disease Father   . Hyperlipidemia Father   . Hypertension Father   . Stroke Father        x4  . COPD Sister   . Peripheral Artery Disease Sister   . Asthma Brother   . Birth defects Brother   . Cancer Brother        skin  . Hyperlipidemia Brother   . Heart attack Maternal Grandmother   . Heart disease Maternal Grandmother   . Hyperlipidemia Maternal Grandmother   . Stroke Maternal Grandmother   . Alcohol abuse Paternal Grandmother   . Cancer Paternal Grandmother        colon  . Cancer Paternal Grandfather        breast  . Heart attack Paternal Grandfather   . Breast cancer Paternal Grandfather 47  . Alcohol abuse Brother   . Early death Brother        suicide  . Mental illness Brother   . Asthma Daughter   . Asthma Son   . Asthma Son   . Breast cancer Maternal Aunt 60  . Kidney cancer Paternal Aunt 85  . Breast cancer Paternal Aunt 3  . Throat cancer Paternal Uncle   . Thyroid cancer Paternal Uncle   . Breast cancer Cousin 25  . Prostate cancer  Cousin     Social History   Socioeconomic History  . Marital status: Married    Spouse name: Not on file  . Number of children: Not on file  . Years of education: Not on file  . Highest education level:  Not on file  Occupational History  . Not on file  Tobacco Use  . Smoking status: Never Smoker  . Smokeless tobacco: Never Used  Vaping Use  . Vaping Use: Never used  Substance and Sexual Activity  . Alcohol use: Yes    Comment: occ  . Drug use: Never  . Sexual activity: Yes    Partners: Male    Birth control/protection: Surgical    Comment: Hyst-ovaries remain  Other Topics Concern  . Not on file  Social History Narrative  . Not on file   Social Determinants of Health   Financial Resource Strain:   . Difficulty of Paying Living Expenses: Not on file  Food Insecurity:   . Worried About Charity fundraiser in the Last Year: Not on file  . Ran Out of Food in the Last Year: Not on file  Transportation Needs:   . Lack of Transportation (Medical): Not on file  . Lack of Transportation (Non-Medical): Not on file  Physical Activity:   . Days of Exercise per Week: Not on file  . Minutes of Exercise per Session: Not on file  Stress:   . Feeling of Stress : Not on file  Social Connections:   . Frequency of Communication with Friends and Family: Not on file  . Frequency of Social Gatherings with Friends and Family: Not on file  . Attends Religious Services: Not on file  . Active Member of Clubs or Organizations: Not on file  . Attends Archivist Meetings: Not on file  . Marital Status: Not on file  Intimate Partner Violence:   . Fear of Current or Ex-Partner: Not on file  . Emotionally Abused: Not on file  . Physically Abused: Not on file  . Sexually Abused: Not on file    Review of Systems  Skin:       Bruising easily--saw PCP--referring to Hematology  All other systems reviewed and are negative.   PHYSICAL EXAMINATION:    BP 134/80   Pulse 66   Ht  5' 5.25" (1.657 m)   Wt 185 lb (83.9 kg)   LMP  (LMP Unknown)   BMI 30.55 kg/m     General appearance: alert, cooperative and appears stated age Head: Normocephalic, without obvious abnormality, atraumatic Neck: no adenopathy, supple, symmetrical, trachea midline and thyroid normal to inspection and palpation Lungs: clear to auscultation bilaterally Heart: regular rate and rhythm Abdomen: soft, non-tender, no masses,  no organomegaly Extremities: extremities normal, atraumatic, no cyanosis or edema Skin: Skin color, texture, turgor normal. No rashes or lesions Lymph nodes: Cervical, supraclavicular, and inguinal nodes are normal. Neurologic: Grossly normal  Pelvic: External genitalia:  no lesions              Urethra:  normal appearing urethra with no masses, tenderness or lesions              Bartholins and Skenes: normal                 Vagina: normal appearing vagina with normal color and discharge, no lesions.  First to second degree cystocele and first to second degree rectocele.                   Cervix: no lesions, good support.                Bimanual Exam:  Uterus:  Absent              Adnexa: no  mass, fullness, tenderness              Rectal exam: Yes.  .  Confirms.              Anus:  normal sphincter tone, Hemorrhoid noted.   Chaperone was present for exam.  ASSESSMENT  Stress incontinence.  Cystocele and rectocele.  Symptomatic.  Status post laparoscopic supracervical hysterectomy for fibroids and endometriosis.  Ovaries remain. She has an abdominal wall mesh. Menopausal symptoms and atrophy. On Gabapentin. Mother deceased from breast cancer. Maternal aunt with breast cancer. Paternal grandfather had breast cancer. Patient has had negative genetic testing.  Arthritis.  On Plaquinil and Rinvoq.  Dr. Bennie Dallas managing.  Hemorrhoid.    PLAN  We discussed anterior and posterior colporrhaphy with native tissue repair, TVT Exact midurethral sling and cystoscopy.    Benefits and risks of surgery discussed.   Risks of surgery include but are not limited to bleeding, infection, damage to surrounding organs, vaginal pain with sexual activity, permanent mesh use which may cause erosion and exposure in the vagina, urethra, bladder or ureters, dyspareunia, slower voiding and urinary retention, possible need for prolonged catheterization, de novo overactive bladder symptoms, reoperation, recurrence of prolapse and incontinence,  DVT, PE, death, and reaction to anesthesia.    I have discussed surgical expectations regarding the procedures and recovery.    Patient wishes to proceed.  We discussed options for menopausal care - increasing Gabapentin to day time use, starting SSRI or SNRI, or estrogen therapy.   She will start Effexor XR 37.5 mg.  Potential side effects reviewed.  Stop Rinoviq 1 week prior to surgery.

## 2020-05-19 NOTE — Patient Instructions (Signed)
Venlafaxine extended-release capsules What is this medicine? VENLAFAXINE(VEN la fax een) is used to treat depression, anxiety and panic disorder. This medicine may be used for other purposes; ask your health care provider or pharmacist if you have questions. COMMON BRAND NAME(S): Effexor XR What should I tell my health care provider before I take this medicine? They need to know if you have any of these conditions:  bleeding disorders  glaucoma  heart disease  high blood pressure  high cholesterol  kidney disease  liver disease  low levels of sodium in the blood  mania or bipolar disorder  seizures  suicidal thoughts, plans, or attempt; a previous suicide attempt by you or a family  take medicines that treat or prevent blood clots  thyroid disease  an unusual or allergic reaction to venlafaxine, desvenlafaxine, other medicines, foods, dyes, or preservatives  pregnant or trying to get pregnant  breast-feeding How should I use this medicine? Take this medicine by mouth with a full glass of water. Follow the directions on the prescription label. Do not cut, crush, or chew this medicine. Take it with food. If needed, the capsule may be carefully opened and the entire contents sprinkled on a spoonful of cool applesauce. Swallow the applesauce/pellet mixture right away without chewing and follow with a glass of water to ensure complete swallowing of the pellets. Try to take your medicine at about the same time each day. Do not take your medicine more often than directed. Do not stop taking this medicine suddenly except upon the advice of your doctor. Stopping this medicine too quickly may cause serious side effects or your condition may worsen. A special MedGuide will be given to you by the pharmacist with each prescription and refill. Be sure to read this information carefully each time. Talk to your pediatrician regarding the use of this medicine in children. Special care may be  needed. Overdosage: If you think you have taken too much of this medicine contact a poison control center or emergency room at once. NOTE: This medicine is only for you. Do not share this medicine with others. What if I miss a dose? If you miss a dose, take it as soon as you can. If it is almost time for your next dose, take only that dose. Do not take double or extra doses. What may interact with this medicine? Do not take this medicine with any of the following medications:  certain medicines for fungal infections like fluconazole, itraconazole, ketoconazole, posaconazole, voriconazole  cisapride  desvenlafaxine  dronedarone  duloxetine  levomilnacipran  linezolid  MAOIs like Carbex, Eldepryl, Marplan, Nardil, and Parnate  methylene blue (injected into a vein)  milnacipran  pimozide  thioridazine This medicine may also interact with the following medications:  amphetamines  aspirin and aspirin-like medicines  certain medicines for depression, anxiety, or psychotic disturbances  certain medicines for migraine headaches like almotriptan, eletriptan, frovatriptan, naratriptan, rizatriptan, sumatriptan, zolmitriptan  certain medicines for sleep  certain medicines that treat or prevent blood clots like dalteparin, enoxaparin, warfarin  cimetidine  clozapine  diuretics  fentanyl  furazolidone  indinavir  isoniazid  lithium  metoprolol  NSAIDS, medicines for pain and inflammation, like ibuprofen or naproxen  other medicines that prolong the QT interval (cause an abnormal heart rhythm) like dofetilide, ziprasidone  procarbazine  rasagiline  supplements like St. John's wort, kava kava, valerian  tramadol  tryptophan This list may not describe all possible interactions. Give your health care provider a list of all the medicines,   herbs, non-prescription drugs, or dietary supplements you use. Also tell them if you smoke, drink alcohol, or use illegal  drugs. Some items may interact with your medicine. What should I watch for while using this medicine? Tell your doctor if your symptoms do not get better or if they get worse. Visit your doctor or health care professional for regular checks on your progress. Because it may take several weeks to see the full effects of this medicine, it is important to continue your treatment as prescribed by your doctor. Patients and their families should watch out for new or worsening thoughts of suicide or depression. Also watch out for sudden changes in feelings such as feeling anxious, agitated, panicky, irritable, hostile, aggressive, impulsive, severely restless, overly excited and hyperactive, or not being able to sleep. If this happens, especially at the beginning of treatment or after a change in dose, call your health care professional. This medicine can cause an increase in blood pressure. Check with your doctor for instructions on monitoring your blood pressure while taking this medicine. You may get drowsy or dizzy. Do not drive, use machinery, or do anything that needs mental alertness until you know how this medicine affects you. Do not stand or sit up quickly, especially if you are an older patient. This reduces the risk of dizzy or fainting spells. Alcohol may interfere with the effect of this medicine. Avoid alcoholic drinks. Your mouth may get dry. Chewing sugarless gum, sucking hard candy and drinking plenty of water will help. Contact your doctor if the problem does not go away or is severe. What side effects may I notice from receiving this medicine? Side effects that you should report to your doctor or health care professional as soon as possible:  allergic reactions like skin rash, itching or hives, swelling of the face, lips, or tongue  anxious  breathing problems  confusion  changes in vision  chest pain  confusion  elevated mood, decreased need for sleep, racing thoughts, impulsive  behavior  eye pain  fast, irregular heartbeat  feeling faint or lightheaded, falls  feeling agitated, angry, or irritable  hallucination, loss of contact with reality  high blood pressure  loss of balance or coordination  palpitations  redness, blistering, peeling or loosening of the skin, including inside the mouth  restlessness, pacing, inability to keep still  seizures  stiff muscles  suicidal thoughts or other mood changes  trouble passing urine or change in the amount of urine  trouble sleeping  unusual bleeding or bruising  unusually weak or tired  vomiting Side effects that usually do not require medical attention (report to your doctor or health care professional if they continue or are bothersome):  change in sex drive or performance  change in appetite or weight  constipation  dizziness  dry mouth  headache  increased sweating  nausea  tired This list may not describe all possible side effects. Call your doctor for medical advice about side effects. You may report side effects to FDA at 1-800-FDA-1088. Where should I keep my medicine? Keep out of the reach of children. Store at a controlled temperature between 20 and 25 degrees C (68 degrees and 77 degrees F), in a dry place. Throw away any unused medicine after the expiration date. NOTE: This sheet is a summary. It may not cover all possible information. If you have questions about this medicine, talk to your doctor, pharmacist, or health care provider.  2020 Elsevier/Gold Standard (2018-08-27 12:06:43)  

## 2020-05-27 NOTE — Progress Notes (Signed)
Cardiology Office Note   Date:  05/28/2020   ID:  [name], DOB 12-31-1965, MRN [id number]  PCP:  Orma Flaming, MD  Cardiologist:   Dorris Carnes, MD   Pt presednts for follow up of bradycardia, dizzienss     History of Present Illness: [name] is a 54 y.o. female with a history of chest pain (pleuritic, positional), bradycardia , LBBB and dizziness  Seh was seen on [facility] on 03/16/19   HThe pt was set up for 48 hour holter.   This showed SR to sT 60 to 178 bpm  Average HR was 84 BPM   I saw the pt in Aug 2020    The pt remains active  She denies signif CP   Does boxiing     She is being referred to Hematology clinic for bruising   Also, the pt has a GYN surgery planned iat end of October.      Current Meds  Medication Sig  . ADVAIR DISKUS 250-50 MCG/DOSE AEPB TAKE 1 PUFF BY MOUTH TWICE A DAY  . albuterol (VENTOLIN HFA) 108 (90 Base) MCG/ACT inhaler TAKE 2 PUFFS BY MOUTH EVERY 6 HOURS AS NEEDED FOR WHEEZE OR SHORTNESS OF BREATH  . cholecalciferol (VITAMIN D) 400 units TABS tablet Take 5,000 Units by mouth.   . cyclobenzaprine (FLEXERIL) 10 MG tablet Take 1 tablet (10 mg total) by mouth 3 (three) times daily as needed for muscle spasms.  . Diclofenac Sodium ([name]) 2 % SOLN Place 1 application onto the skin 2 (two) times daily.  . fexofenadine-pseudoephedrine (ALLEGRA-D 24) 180-240 MG 24 hr tablet One tablet as needed daily for allergies   . gabapentin (NEURONTIN) 800 MG tablet Take 1 tablet (800 mg total) by mouth at bedtime.  . hydroxychloroquine (PLAQUENIL) 200 MG tablet Take 1 tablet (200 mg total) by mouth 2 (two) times daily.  Marland Kitchen levothyroxine (SYNTHROID) 150 MCG tablet TAKE 1 TABLET BY MOUTH EVERY OTHER DAY  . montelukast (SINGULAIR) 10 MG tablet TAKE 1 TABLET BY MOUTH EVERYDAY AT BEDTIME  . RINVOQ 15 MG TB24 TAKE 1 TABLET BY MOUTH 1 TIME A DAY.  . SYNTHROID 175 MCG tablet Take 175 mcg by mouth every morning.  . venlafaxine XR (EFFEXOR XR) 37.5 MG 24 hr  capsule Take 1 capsule (37.5 mg total) by mouth daily.     Allergies:   Orencia [abatacept], Shellfish allergy, Gluten meal, Lortab [hydrocodone-acetaminophen], Morphine and related, and Strawberry (diagnostic)   Past Medical History:  Diagnosis Date  . Arthritis   . Asthma   . Family history of breast cancer   . Fibroid   . Frequent headaches   . GERD (gastroesophageal reflux disease)   . Gluten intolerance   . H/O seasonal allergies   . History of chicken pox   . History of COVID-19 07/2019  . Left bundle branch block 02/2019   Dr.Kayelynn Abdou  . Migraines   . Thyroid disease    Multiple benign nodules--thyroid removed    Past Surgical History:  Procedure Laterality Date  . CARDIAC CATHETERIZATION    . CHOLECYSTECTOMY  1995  . [place]  2009  . HERNIA REPAIR  2015   Has abdominal wall mesh - [place], [place].  Laparoscopic incisional hernia repari with 7 x 9 inch Ventralight ST with Echo mesh  . LAPAROSCOPIC HYSTERECTOMY  2007   Supracervical hysterectomy with cystosctopy [place], [place]  . THYROID SURGERY       Social History:  The  patient  reports that she has never smoked. She has never used smokeless tobacco. She reports current alcohol use. She reports that she does not use drugs.   Family History:  The patient's family history includes Alcohol abuse in her brother and paternal grandmother; Arthritis in her mother; Asthma in her brother, daughter, son, and son; Birth defects in her brother; Breast cancer (age of onset: 50) in her mother; Breast cancer (age of onset: 21) in her cousin; Breast cancer (age of onset: 48) in her maternal aunt and paternal grandfather; Breast cancer (age of onset: 65) in her paternal aunt; COPD in her sister; Cancer in her brother, paternal grandfather, and paternal grandmother; Cancer (age of onset: 67) in her mother; Diabetes in her father; Early death in her brother; Heart attack in her father, maternal grandmother, and paternal  grandfather; Heart disease in her father and maternal grandmother; Hyperlipidemia in her brother, father, and maternal grandmother; Hypertension in her father; Kidney cancer (age of onset: 14) in her paternal aunt; Mental illness in her brother; Miscarriages / Stillbirths in her mother; Peripheral Artery Disease in her sister; Prostate cancer in her cousin; Stroke in her father and maternal grandmother; Throat cancer in her paternal uncle; Thyroid cancer in her paternal uncle.    ROS:  Please see the history of present illness. All other systems are reviewed and  Negative to the above problem except as noted.    PHYSICAL EXAM: VS:  BP 138/88   Pulse 85   Ht 5\' 7"  (1.702 m)   Wt 185 lb (83.9 kg)   LMP  (LMP Unknown)   BMI 28.98 kg/m   GEN: Well nourished, well developed, in no acute distress  HEENT: normal  Neck: no JVD, carotid bruits  Cardiac: RRR; no murmurs ,no LE edema  Respiratory:  clear to auscultation bilaterally,  GI: soft, nontender, nondistended, + BS  No hepatomegaly  MS: no deformity Moving all extremities   Skin: warm and dry, no rash  Bruising (mild) in legs  Neuro:  Strength and sensation are intact Psych: euthymic mood, full affect   EKG:  EKG is SR  LBBB  85 bpm   Lipid Panel    Component Value Date/Time   CHOL 147 07/28/2019 0847   TRIG 67 07/28/2019 0847   HDL 71 07/28/2019 0847   CHOLHDL 2.1 07/28/2019 0847   CHOLHDL 3 06/28/2018 0938   VLDL 17.0 06/28/2018 0938   LDLCALC 63 07/28/2019 0847      Wt Readings from Last 3 Encounters:  05/28/20 185 lb (83.9 kg)  05/19/20 185 lb (83.9 kg)  05/14/20 184 lb (83.5 kg)      ASSESSMENT AND PLAN:  1  Hx bradycardia   Pt hemodynamically appear stable  Holter done in past was OK  2  BP   Blood pressure is a little elevated   Effexor added for hot flashes   I have asked pt to follow this   Email in about 6 wks with response / readings  3  Dizzienss  Pt denies toda  4    LBBB   Old  Myovue normal  perfusion   Echo with normal LVEF    5  HL   On Crestor  6  LFT   Elevated   Minimal   AST greater than ALT  Will recheck in several weeks   Plan for f/u next summer       Current medicines are reviewed at length with the patient today.  The patient does not have concerns regarding medicines.  Signed, Dorris Carnes, MD  05/28/2020 3:04 PM    [facility] [address], [place], [place]  [address] Phone: [phone]; Fax: [phone]

## 2020-05-28 ENCOUNTER — Ambulatory Visit: Payer: No Typology Code available for payment source | Admitting: Internal Medicine

## 2020-05-28 ENCOUNTER — Encounter: Payer: Self-pay | Admitting: Internal Medicine

## 2020-05-28 ENCOUNTER — Other Ambulatory Visit: Payer: Self-pay

## 2020-05-28 VITALS — BP 138/88 | HR 85 | Ht 67.0 in | Wt 185.0 lb

## 2020-05-28 DIAGNOSIS — E785 Hyperlipidemia, unspecified: Secondary | ICD-10-CM | POA: Diagnosis not present

## 2020-05-28 NOTE — Patient Instructions (Signed)
Medication Instructions:  No changes *If you need a refill on your cardiac medications before your next appointment, please call your pharmacy*   Lab Work: Liver function panel -with next bloodwork. If you have labs (blood work) drawn today and your tests are completely normal, you will receive your results only by: Marland Kitchen MyChart Message (if you have MyChart) OR . A paper copy in the mail If you have any lab test that is abnormal or we need to change your treatment, we will call you to review the results.   Testing/Procedures: none   Follow-Up: At [facility], you and your health needs are our priority.  As part of our continuing mission to provide you with exceptional heart care, we have created designated Provider Care Teams.  These Care Teams include your primary Cardiologist (physician) and Advanced Practice Providers (APPs -  Physician Assistants and Nurse Practitioners) who all work together to provide you with the care you need, when you need it.   Your next appointment:   11 month(s) -August 2022  The format for your next appointment:   In Person  Provider:   You may see Dorris Carnes, MD or one of the following Advanced Practice Providers on your designated Care Team:    Richardson Dopp, PA-C  [place], [place]    Other Instructions Keep track of blood pressures at home and in a few weeks send readings to Dr. Harrington Challenger (MyChart message).

## 2020-06-01 ENCOUNTER — Encounter: Payer: Self-pay | Admitting: Rheumatology

## 2020-06-01 ENCOUNTER — Ambulatory Visit: Payer: No Typology Code available for payment source | Admitting: Rheumatology

## 2020-06-01 ENCOUNTER — Other Ambulatory Visit: Payer: Self-pay

## 2020-06-01 VITALS — BP 126/81 | HR 81 | Resp 14 | Ht 67.0 in | Wt 182.0 lb

## 2020-06-01 DIAGNOSIS — R202 Paresthesia of skin: Secondary | ICD-10-CM

## 2020-06-01 DIAGNOSIS — R7989 Other specified abnormal findings of blood chemistry: Secondary | ICD-10-CM

## 2020-06-01 DIAGNOSIS — M19071 Primary osteoarthritis, right ankle and foot: Secondary | ICD-10-CM

## 2020-06-01 DIAGNOSIS — Z79899 Other long term (current) drug therapy: Secondary | ICD-10-CM | POA: Diagnosis not present

## 2020-06-01 DIAGNOSIS — M19072 Primary osteoarthritis, left ankle and foot: Secondary | ICD-10-CM

## 2020-06-01 DIAGNOSIS — M0609 Rheumatoid arthritis without rheumatoid factor, multiple sites: Secondary | ICD-10-CM | POA: Diagnosis not present

## 2020-06-01 DIAGNOSIS — M7061 Trochanteric bursitis, right hip: Secondary | ICD-10-CM

## 2020-06-01 DIAGNOSIS — Z8719 Personal history of other diseases of the digestive system: Secondary | ICD-10-CM

## 2020-06-01 DIAGNOSIS — Z8709 Personal history of other diseases of the respiratory system: Secondary | ICD-10-CM

## 2020-06-01 DIAGNOSIS — T148XXA Other injury of unspecified body region, initial encounter: Secondary | ICD-10-CM

## 2020-06-01 DIAGNOSIS — Z7189 Other specified counseling: Secondary | ICD-10-CM

## 2020-06-01 DIAGNOSIS — M7062 Trochanteric bursitis, left hip: Secondary | ICD-10-CM

## 2020-06-01 DIAGNOSIS — Z8669 Personal history of other diseases of the nervous system and sense organs: Secondary | ICD-10-CM

## 2020-06-01 DIAGNOSIS — Z8639 Personal history of other endocrine, nutritional and metabolic disease: Secondary | ICD-10-CM

## 2020-06-01 DIAGNOSIS — R931 Abnormal findings on diagnostic imaging of heart and coronary circulation: Secondary | ICD-10-CM

## 2020-06-01 NOTE — Patient Instructions (Addendum)
COVID-19 vaccine recommendations:   COVID-19 vaccine is recommended for everyone (unless you are allergic to a vaccine component), even if you are on a medication that suppresses your immune system.   If you are on Methotrexate, Cellcept (mycophenolate), Rinvoq, Morrie Sheldon, and Olumiant- hold the medication for 1 week after each vaccine. Hold Methotrexate for 2 weeks after the single dose COVID-19 vaccine.   If you are on Orencia subcutaneous injection - hold medication one week prior to and one week after the first COVID-19 vaccine dose (only).   If you are on Orencia IV infusions- time vaccination administration so that the first COVID-19 vaccination will occur four weeks after the infusion and postpone the subsequent infusion by one week.   If you are on Cyclophosphamide or Rituxan infusions please contact your doctor prior to receiving the COVID-19 vaccine.   Do not take Tylenol or any anti-inflammatory medications (NSAIDs) 24 hours prior to the COVID-19 vaccination.   There is no direct evidence about the efficacy of the COVID-19 vaccine in individuals who are on medications that suppress the immune system.   Even if you are fully vaccinated, and you are on any medications that suppress your immune system, please continue to wear a mask, maintain at least six feet social distance and practice hand hygiene.   If you develop a COVID-19 infection, please contact your PCP or our office to determine if you need antibody infusion.  The booster vaccine is now available for immunocompromised patients. It is advised that if you had Pfizer vaccine you should get Coca[employer]a booster.  If you had a Moderna vaccine then you should get a Moderna booster. Johnson and Wynetta Emery does not have a booster vaccine at this time.  Please see the following web sites for updated information.    [email or web address]  [email or web address] Standing Labs We placed an order today for your standing lab work.   Please have your standing labs drawn in November  If possible, please have your labs drawn 2 weeks prior to your appointment so that the provider can discuss your results at your appointment.  We have open lab daily Monday through Thursday from 8:30-12:30 PM and 1:30-4:30 PM and Friday from 8:30-12:30 PM and 1:30-4:00 PM at the office of Dr. Bo Merino, [facility].   Please be advised, patients with office appointments requiring lab work will take precedents over walk-in lab work.  If possible, please come for your lab work on Monday and Friday afternoons, as you may experience shorter wait times. The office is located at [address], [place], [place], [place] [address] No appointment is necessary.   Labs are drawn by [facility]. Please bring your co-pay at the time of your lab draw.  You may receive a bill from [facility] for your lab work.  If you wish to have your labs drawn at another location, please call the office 24 hours in advance to send orders.  If you have any questions regarding directions or hours of operation,  please call [phone].   As a reminder, please drink plenty of water prior to coming for your lab work. Thanks!

## 2020-06-01 NOTE — Addendum Note (Signed)
Addended by: Francis Gaines C on: 06/01/2020 11:55 AM   Modules accepted: Orders

## 2020-06-03 ENCOUNTER — Other Ambulatory Visit: Payer: Self-pay

## 2020-06-03 ENCOUNTER — Inpatient Hospital Stay: Payer: No Typology Code available for payment source | Attending: Oncology | Admitting: Oncology

## 2020-06-03 ENCOUNTER — Other Ambulatory Visit (HOSPITAL_COMMUNITY): Payer: Self-pay

## 2020-06-03 VITALS — BP 147/90 | HR 78 | Temp 98.0°F | Resp 18 | Ht 67.0 in | Wt 185.7 lb

## 2020-06-03 DIAGNOSIS — R233 Spontaneous ecchymoses: Secondary | ICD-10-CM | POA: Insufficient documentation

## 2020-06-03 DIAGNOSIS — R58 Hemorrhage, not elsewhere classified: Secondary | ICD-10-CM

## 2020-06-03 DIAGNOSIS — E039 Hypothyroidism, unspecified: Secondary | ICD-10-CM | POA: Diagnosis not present

## 2020-06-03 DIAGNOSIS — Z803 Family history of malignant neoplasm of breast: Secondary | ICD-10-CM | POA: Diagnosis not present

## 2020-06-03 DIAGNOSIS — M069 Rheumatoid arthritis, unspecified: Secondary | ICD-10-CM | POA: Insufficient documentation

## 2020-06-03 NOTE — Progress Notes (Signed)
Reason for the request:    Easy bruising  HPI: I was asked by Dr. Rogers Blocker to evaluate [name] for easy bruising.  She is a 54 year old woman with history of rheumatoid arthritis, hypothyroidism, left bundle branch block.  She is native of [place] and has moved to on multiple occasions.  She has been living in this area for the last 2 years.  She has been on Plaquenil which she has been on for close to 5 years and Rinvoq that was started in the last 6 months.  She has been on other agents including Humira and methotrexate which were discontinued because of given poor tolerance or ineffectiveness.  She has reported issues with bruising predominantly the majority of her life but has worsened in the last 6 months.  She denied any bleeding complications including hematochezia, melena, hemoptysis or hematemesis.  She does not have any postoperative bleeding issues and has had no bleeding after dental procedures.  She had multiple surgeries in the past without any issues.  Laboratory data in the last 2 years showed a normal CBC including white cell count, hemoglobin and platelet count.  On August 27 her platelet count was 267.  Coagulation parameters including INR of 0.9 was also documented.  Imaging studies including CT coronary morphology completed in September 2020 did not show any other pathology.  She is up-to-date on her mammography.  Chemistries including kidney function and electrolytes all within normal range.  She does have mild elevation in her AST and ALT which is chronic in nature.  She does not report any headaches, blurry vision, syncope or seizures. Does not report any fevers, chills or sweats.  Does not report any cough, wheezing or hemoptysis.  Does not report any chest pain, palpitation, orthopnea or leg edema.  Does not report any nausea, vomiting or abdominal pain.  Does not report any constipation or diarrhea.  Does not report any skeletal complaints.    Does not report frequency, urgency or  hematuria.  Does not report any skin rashes or lesions. Does not report any heat or cold intolerance.  Does not report any lymphadenopathy or petechiae.  Does not report any anxiety or depression.  Remaining review of systems is negative.    Past Medical History:  Diagnosis Date  . Arthritis   . Asthma   . Family history of breast cancer   . Fibroid   . Frequent headaches   . GERD (gastroesophageal reflux disease)   . Gluten intolerance   . H/O seasonal allergies   . History of chicken pox   . History of COVID-19 07/2019  . Left bundle branch block 02/2019   Dr.Paula Ross  . Migraines   . Thyroid disease    Multiple benign nodules--thyroid removed  :  Past Surgical History:  Procedure Laterality Date  . CARDIAC CATHETERIZATION    . CHOLECYSTECTOMY  1995  . [place]  2009  . HERNIA REPAIR  2015   Has abdominal wall mesh - [place], [place].  Laparoscopic incisional hernia repari with 7 x 9 inch Ventralight ST with Echo mesh  . LAPAROSCOPIC HYSTERECTOMY  2007   Supracervical hysterectomy with cystosctopy - [place], [place]  . THYROID SURGERY    :   Current Outpatient Medications:  .  ADVAIR DISKUS 250-50 MCG/DOSE AEPB, TAKE 1 PUFF BY MOUTH TWICE A DAY, Disp: 180 each, Rfl: 3 .  albuterol (VENTOLIN HFA) 108 (90 Base) MCG/ACT inhaler, TAKE 2 PUFFS BY MOUTH EVERY 6 HOURS AS NEEDED  FOR WHEEZE OR SHORTNESS OF BREATH, Disp: 25.5 g, Rfl: 5 .  cholecalciferol (VITAMIN D) 400 units TABS tablet, Take 5,000 Units by mouth. , Disp: , Rfl:  .  cyclobenzaprine (FLEXERIL) 10 MG tablet, Take 1 tablet (10 mg total) by mouth 3 (three) times daily as needed for muscle spasms., Disp: 30 tablet, Rfl: 0 .  Diclofenac Sodium ([name]) 2 % SOLN, Place 1 application onto the skin 2 (two) times daily., Disp: 112 g, Rfl: 2 .  fexofenadine-pseudoephedrine (ALLEGRA-D 24) 180-240 MG 24 hr tablet, One tablet as needed daily for allergies , Disp: 30 tablet, Rfl: 5 .  gabapentin (NEURONTIN) 800 MG  tablet, Take 1 tablet (800 mg total) by mouth at bedtime., Disp: 90 tablet, Rfl: 1 .  hydroxychloroquine (PLAQUENIL) 200 MG tablet, Take 1 tablet (200 mg total) by mouth 2 (two) times daily., Disp: 180 tablet, Rfl: 0 .  levothyroxine (SYNTHROID) 150 MCG tablet, TAKE 1 TABLET BY MOUTH EVERY OTHER DAY, Disp: 45 tablet, Rfl: 3 .  montelukast (SINGULAIR) 10 MG tablet, TAKE 1 TABLET BY MOUTH EVERYDAY AT BEDTIME, Disp: 90 tablet, Rfl: 3 .  RINVOQ 15 MG TB24, TAKE 1 TABLET BY MOUTH 1 TIME A DAY., Disp: 30 tablet, Rfl: 2 .  rosuvastatin (CRESTOR) 5 MG tablet, Take 0.5 tablets (2.5 mg total) by mouth daily., Disp: 90 tablet, Rfl: 3 .  SYNTHROID 175 MCG tablet, Take 175 mcg by mouth every morning., Disp: , Rfl:  .  venlafaxine XR (EFFEXOR XR) 37.5 MG 24 hr capsule, Take 1 capsule (37.5 mg total) by mouth daily., Disp: 30 capsule, Rfl: 2:  Allergies  Allergen Reactions  . Orencia [Abatacept] Hives  . Shellfish Allergy Anaphylaxis and Hives  . Gluten Meal     Bloody diarrhea and hives  . Lortab [Hydrocodone-Acetaminophen]     Swelling of tongue  . Morphine And Related Itching    irritable  . Strawberry (Diagnostic)     Hazelnuts, okra  :  Family History  Problem Relation Age of Onset  . Arthritis Mother   . Cancer Mother 81       breast ca--dec age 79  . Miscarriages / [place] Mother   . Breast cancer Mother 68  . Diabetes Father   . Heart attack Father   . Heart disease Father        pacemaker   . Hyperlipidemia Father   . Hypertension Father   . Stroke Father        x4  . COPD Sister   . Peripheral Artery Disease Sister   . Asthma Brother   . Birth defects Brother   . Cancer Brother        skin  . Hyperlipidemia Brother   . Heart attack Maternal Grandmother   . Heart disease Maternal Grandmother   . Hyperlipidemia Maternal Grandmother   . Stroke Maternal Grandmother   . Alcohol abuse Paternal Grandmother   . Cancer Paternal Grandmother        colon  . Cancer Paternal  Grandfather        breast  . Heart attack Paternal Grandfather   . Breast cancer Paternal Grandfather 60  . Alcohol abuse Brother   . Early death Brother        suicide  . Mental illness Brother   . Asthma Daughter   . Asthma Son   . Asthma Son   . Breast cancer Maternal Aunt 60  . Kidney cancer Paternal Aunt 85  . Breast cancer Paternal Aunt  74  . Throat cancer Paternal Uncle   . Thyroid cancer Paternal Uncle   . Breast cancer Cousin 48  . Prostate cancer Cousin   :  Social History   Socioeconomic History  . Marital status: Married    Spouse name: Not on file  . Number of children: Not on file  . Years of education: Not on file  . Highest education level: Not on file  Occupational History  . Not on file  Tobacco Use  . Smoking status: Never Smoker  . Smokeless tobacco: Never Used  Vaping Use  . Vaping Use: Never used  Substance and Sexual Activity  . Alcohol use: Yes    Comment: occ  . Drug use: Never  . Sexual activity: Yes    Partners: Male    Birth control/protection: Surgical    Comment: Hyst-ovaries remain  Other Topics Concern  . Not on file  Social History Narrative  . Not on file   Social Determinants of Health   Financial Resource Strain:   . Difficulty of Paying Living Expenses: Not on file  Food Insecurity:   . Worried About Charity fundraiser in the Last Year: Not on file  . Ran Out of Food in the Last Year: Not on file  Transportation Needs:   . Lack of Transportation (Medical): Not on file  . Lack of Transportation (Non-Medical): Not on file  Physical Activity:   . Days of Exercise per Week: Not on file  . Minutes of Exercise per Session: Not on file  Stress:   . Feeling of Stress : Not on file  Social Connections:   . Frequency of Communication with Friends and Family: Not on file  . Frequency of Social Gatherings with Friends and Family: Not on file  . Attends Religious Services: Not on file  . Active Member of Clubs or  Organizations: Not on file  . Attends Archivist Meetings: Not on file  . Marital Status: Not on file  Intimate Partner Violence:   . Fear of Current or Ex-Partner: Not on file  . Emotionally Abused: Not on file  . Physically Abused: Not on file  . Sexually Abused: Not on file  :  Pertinent items are noted in HPI.  Exam: Blood pressure (!) 147/90, pulse 78, temperature 98 F (36.7 C), temperature source Tympanic, resp. rate 18, height 5\' 7"  (1.702 m), weight 185 lb 11.2 oz (84.2 kg), SpO2 100 %.   ECOG 0 General appearance: alert and cooperative appeared without distress. Head: atraumatic without any abnormalities. Eyes: conjunctivae/corneas clear. PERRL.  Sclera anicteric. Throat: lips, mucosa, and tongue normal; without oral thrush or ulcers. Resp: clear to auscultation bilaterally without rhonchi, wheezes or dullness to percussion. Cardio: regular rate and rhythm, S1, S2 normal, no murmur, click, rub or gallop GI: soft, non-tender; bowel sounds normal; no masses,  no organomegaly Skin: Ecchymosis noted on her lower and upper extremities. Lymph nodes: Cervical, supraclavicular, and axillary nodes normal. Neurologic: Grossly normal without any motor, sensory or deep tendon reflexes. Musculoskeletal: No joint deformity or effusion.  CBC    Component Value Date/Time   WBC 6.7 05/14/2020 1358   RBC 4.11 05/14/2020 1358   HGB 12.6 05/14/2020 1358   HCT 36.4 05/14/2020 1358   PLT 267 05/14/2020 1358   MCV 88.6 05/14/2020 1358   MCH 30.7 05/14/2020 1358   MCHC 34.6 05/14/2020 1358   RDW 12.4 05/14/2020 1358   LYMPHSABS 2,935 05/14/2020 1358   MONOABS 0.4  11/28/2019 1019   EOSABS 27 05/14/2020 1358   BASOSABS 40 05/14/2020 1358   CMP Latest Ref Rng & Units 05/14/2020 03/29/2020 11/28/2019  Glucose 65 - 99 mg/dL 89 83 97  BUN 7 - 25 mg/dL 12 16 17   Creatinine 0.50 - 1.05 mg/dL 0.87 0.81 0.91  Sodium 135 - 146 mmol/L 140 138 135  Potassium 3.5 - 5.3 mmol/L 4.0 3.9  4.8  Chloride 98 - 110 mmol/L 105 103 100  CO2 20 - 32 mmol/L 29 28 28   Calcium 8.6 - 10.4 mg/dL 9.4 9.7 9.8  Total Protein 6.1 - 8.1 g/dL 6.8 6.6 7.1  Total Bilirubin 0.2 - 1.2 mg/dL 0.8 0.9 1.0  Alkaline Phos 39 - 117 U/L - - 49  AST 10 - 35 U/L 76(H) 41(H) 39(H)  ALT 6 - 29 U/L 46(H) 35(H) 37(H)     Assessment and Plan:   54 year old woman with:  1.  Ecchymosis noted on her upper and lower extremities has been chronic in nature may be worsened in the last 6 months.  She has no evidence of bleeding spontaneously or after surgical procedure.   The differential diagnosis was reviewed at this time.  Primary hematological disorder is considered unlikely at this point given her overall presentation.  She has normal platelet count, coagulation parameters and no bleeding history.  Her ecchymosis is likely related to either medication or autoimmune phenomenon rather than a hematological condition.  Condition such as leukemia, lymphoma or bone marrow disease in general are considered extremely unlikely given her normal CBC.  Bleeding disorders are also considered less likely given her history and normal coagulation parameters.  I do not recommend any further intervention at this time but rather a period of observation and surveillance.  I recommended reevaluation in 6 months and reassess her bruising status at this point.  2.  Follow-up: Will be in 6 months for repeat evaluation.  45  minutes were dedicated to this visit. The time was spent on reviewing laboratory data, imaging studies, discussing discussing differential diagnosis and answering questions regarding future plan.      A copy of this consult has been forwarded to the requesting physician.

## 2020-06-04 ENCOUNTER — Encounter: Payer: Self-pay | Admitting: Neurology

## 2020-06-08 ENCOUNTER — Encounter (HOSPITAL_BASED_OUTPATIENT_CLINIC_OR_DEPARTMENT_OTHER): Payer: Self-pay | Admitting: Obstetrics and Gynecology

## 2020-06-08 NOTE — Progress Notes (Signed)
Spoke w/ via phone for pre-op interview---pt Lab needs dos----   I stat 8            COVID test ------06-10-20 1515 Arrive at -------530 am 06-14-20 NPO after MN NO Solid Food.  Clear liquids from MN until---430 am then npo Medications to take morning of surgery -----advair and albuterol inhaler prn/bring inhalers, effexor, rosuvastatin, levothroxine, allegra d Diabetic medication -----n/a Patient Special Instructions -----pt says she is extended stay, pt given overnight stay instructions  Pre-Op special Istructions -----none Patient verbalized understanding of instructions that were given at this phone interview. Patient denies shortness of breath, chest pain, fever, cough at this phone interview.  Anesthesia Review:no  PCP: dr Kris Mouton Cardiologist :dr Rudean Haskell 05-28-2020 epic Chest x-ray :none EKG : 05-28-2020 Echo :05-12-2019 epic Ct coronary 05-22-2019 epic Stress test:none Cardiac Cath : 2009 done in [place] results normal Activity level: can climb staits without problems, does own housework Sleep Study/ CPAP :none Fasting Blood Sugar :      / Checks Blood Sugar -- times a day:  n/a Blood Thinner/ Instructions /Last Dose:n/a ASA / Instructions/ Last Dose : n/a

## 2020-06-10 ENCOUNTER — Other Ambulatory Visit (HOSPITAL_COMMUNITY)
Admission: RE | Admit: 2020-06-10 | Discharge: 2020-06-10 | Disposition: A | Discharge status: Home / Self Care | Payer: No Typology Code available for payment source | Source: Ambulatory Visit | Attending: Obstetrics and Gynecology | Admitting: Obstetrics and Gynecology

## 2020-06-10 ENCOUNTER — Telehealth: Payer: Self-pay | Admitting: Obstetrics and Gynecology

## 2020-06-10 ENCOUNTER — Other Ambulatory Visit: Payer: Self-pay | Admitting: Obstetrics and Gynecology

## 2020-06-10 DIAGNOSIS — Z01812 Encounter for preprocedural laboratory examination: Secondary | ICD-10-CM | POA: Insufficient documentation

## 2020-06-10 DIAGNOSIS — Z20822 Contact with and (suspected) exposure to covid-19: Secondary | ICD-10-CM | POA: Diagnosis not present

## 2020-06-10 LAB — SARS CORONAVIRUS 2 (TAT 6-24 HRS): SARS Coronavirus 2: NEGATIVE

## 2020-06-10 NOTE — Telephone Encounter (Signed)
Spoke with pt. Pt advised no pre-op blood work needed prior to surgery date on 06/14/20 per Verline Lema, RN-surgery planning.  Pt verbalized understanding.  Encounter closed.

## 2020-06-10 NOTE — Telephone Encounter (Signed)
Patient is having surgery on 06/14/20 with Dr. Quincy Simmonds. Patient is having covid testing today. She wants to know if she needs to have bloodwork also.

## 2020-06-11 NOTE — Progress Notes (Signed)
Spoke with Urban Gibson in Dr. Elza Rafter office about abbreviations on consent order. She will message Dr.Silva to request it be spelled out.

## 2020-06-13 ENCOUNTER — Other Ambulatory Visit: Payer: Self-pay | Admitting: Rheumatology

## 2020-06-13 DIAGNOSIS — M1230 Palindromic rheumatism, unspecified site: Secondary | ICD-10-CM

## 2020-06-13 NOTE — H&P (Signed)
Office Visit  05/19/2020 [place] Silva, Amy All, MD Obstetrics and Gynecology  Female bladder prolapse +3 more Dx  Surgical consult ; Referred by Orma Flaming, MD Reason for Visit  Additional Documentation  Vitals:  BP 134/80  Pulse 66  Ht 5' 5.25" (1.657 m)  Wt 83.9 kg  LMP  (LMP Unknown)  BMI 30.55 kg/m  BSA 1.97 m    More Vitals  Flowsheets:  Anthropometrics,  NEWS,  MEWS Score,  Method of Visit    Encounter Info:  Billing Info,  History,  Allergies,  Detailed Report    [facility] Notes   Progress Notes by Nunzio Cobbs, MD at 05/19/2020 11:30 AM Author: Nunzio Cobbs, MD Author Type: Physician Filed: 05/22/2020  4:47 PM  Note Status: Signed Cosign: Cosign Not Required Encounter Date: 05/19/2020  Editor: Nunzio Cobbs, MD (Physician)      Prior Versions: 1. [name], CMA (Certified Psychologist, sport and exercise) at 05/19/2020 11:31 AM - Sign when Signing Visit      GYNECOLOGY  VISIT   HPI: 54 y.o.   Married  Caucasian  female   [phone] with No LMP recorded (lmp unknown). Patient has had a hysterectomy.   here for surgery consult for pelvic organ prolapse and urinary stress incontinence.    Patient has first to second degree cystocele and a first to second degree rectocele. She has urinary incontinence with exercise.  Some constipation and splinting with BMs. Is sexually active.   Urodynamic testing documented stress incontinence. She has large bladder capacity of 966 cc on pressure flow study.    Patient has done physical therapy and has not improved.  She now desires surgery.   Patient reporting hot flashes and night sweats.   Having increased bruising.  Normal CBC 03/29/20, and normal PT/INR 05/14/20 ordered by her PCP.  Will see hematology on Sept 16 for bruising.   Will see cardiology on Sept. 10 for BBB.    She had genetic counseling and testing due to paternal and  maternal family cancers, and her results were negative.    Had Covid vaccination.  Completed in April.   GYNECOLOGIC HISTORY: No LMP recorded (lmp unknown). Patient has had a hysterectomy. Contraception:  Hyst--ovaries remain Menopausal hormone therapy:  None Last mammogram: 11-21-19 3D/Neg/density B/BiRads1 Last pap smear: 10-16-19 Neg:Neg HR HPV,  2019 normal per patient                OB History     Gravida  5   Para  3   Term      Preterm      AB  2   Living  3      SAB  2   TAB      Ectopic      Multiple      Live Births                        Patient Active Problem List    Diagnosis Date Noted  . Genetic testing 02/18/2020  . Family history of breast cancer    . High risk medication use 10/30/2019  . Left bundle branch block 04/28/2019  . Right wrist pain 03/13/2019  . Hamstring strain, left, initial encounter 02/20/2019  . 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 Primary osteoarthritis of both feet 10/09/2018  . Hx of migraines 09/27/2018  . History of gastroesophageal reflux (  GERD) 09/27/2018  . 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 Moderate persistent asthma 07/05/2018  . Hypothyroid 06/28/2018          Past Medical History:  Diagnosis Date  . Arthritis    . Asthma    . Family history of breast cancer    . Fibroid    . Frequent headaches    . GERD (gastroesophageal reflux disease)    . Gluten intolerance    . H/O seasonal allergies    . History of chicken pox    . History of COVID-19 07/2019  . Left bundle branch block 02/2019    Dr.Paula Ross  . Migraines    . Thyroid disease      Multiple benign nodules--thyroid removed           Past Surgical History:  Procedure Laterality Date  . CARDIAC CATHETERIZATION      . CHOLECYSTECTOMY   1995  . [place]   2009  . HERNIA REPAIR   2015    Has abdominal wall mesh - [place], [place].  Laparoscopic incisional hernia repari with 7 x 9 inch Ventralight ST with  Echo mesh  . LAPAROSCOPIC HYSTERECTOMY   2007    Supracervical hysterectomy with cystosctopy - [place], [place]  . THYROID SURGERY                Current Outpatient Medications  Medication Sig Dispense Refill  . ADVAIR DISKUS 250-50 MCG/DOSE AEPB TAKE 1 PUFF BY MOUTH TWICE A DAY 180 each 3  . albuterol (VENTOLIN HFA) 108 (90 Base) MCG/ACT inhaler TAKE 2 PUFFS BY MOUTH EVERY 6 HOURS AS NEEDED FOR WHEEZE OR SHORTNESS OF BREATH 25.5 g 5  . cholecalciferol (VITAMIN D) 400 units TABS tablet Take 5,000 Units by mouth.       . cyclobenzaprine (FLEXERIL) 10 MG tablet Take 1 tablet (10 mg total) by mouth 3 (three) times daily as needed for muscle spasms. 30 tablet 0  . Diclofenac Sodium ([name]) 2 % SOLN Place 1 application onto the skin 2 (two) times daily. 112 g 2  . fexofenadine-pseudoephedrine (ALLEGRA-D 24) 180-240 MG 24 hr tablet One tablet as needed daily for allergies  30 tablet 5  . gabapentin (NEURONTIN) 800 MG tablet Take 1 tablet (800 mg total) by mouth at bedtime. 90 tablet 1  . hydroxychloroquine (PLAQUENIL) 200 MG tablet Take 1 tablet (200 mg total) by mouth 2 (two) times daily. 180 tablet 0  . levothyroxine (SYNTHROID) 150 MCG tablet TAKE 1 TABLET BY MOUTH EVERY OTHER DAY 45 tablet 3  . montelukast (SINGULAIR) 10 MG tablet TAKE 1 TABLET BY MOUTH EVERYDAY AT BEDTIME 90 tablet 3  . RINVOQ 15 MG TB24 TAKE 1 TABLET BY MOUTH 1 TIME A DAY. 30 tablet 2  . SYNTHROID 175 MCG tablet Take 175 mcg by mouth every morning.      . rosuvastatin (CRESTOR) 5 MG tablet Take 0.5 tablets (2.5 mg total) by mouth daily. 90 tablet 3    No current facility-administered medications for this visit.      ALLERGIES: Orencia [abatacept], Shellfish allergy, Gluten meal, Lortab [hydrocodone-acetaminophen], Morphine and related, and Strawberry (diagnostic)        Family History  Problem Relation Age of Onset  . Arthritis Mother    . Cancer Mother 72        breast ca--dec age 4  . Miscarriages / [place]  Mother    . Breast cancer Mother 89  . Diabetes Father    .  Heart attack Father    . Heart disease Father    . Hyperlipidemia Father    . Hypertension Father    . Stroke Father          x4  . COPD Sister    . Peripheral Artery Disease Sister    . Asthma Brother    . Birth defects Brother    . Cancer Brother          skin  . Hyperlipidemia Brother    . Heart attack Maternal Grandmother    . Heart disease Maternal Grandmother    . Hyperlipidemia Maternal Grandmother    . Stroke Maternal Grandmother    . Alcohol abuse Paternal Grandmother    . Cancer Paternal Grandmother          colon  . Cancer Paternal Grandfather          breast  . Heart attack Paternal Grandfather    . Breast cancer Paternal Grandfather 48  . Alcohol abuse Brother    . Early death Brother          suicide  . Mental illness Brother    . Asthma Daughter    . Asthma Son    . Asthma Son    . Breast cancer Maternal Aunt 60  . Kidney cancer Paternal Aunt 85  . Breast cancer Paternal Aunt 81  . Throat cancer Paternal Uncle    . Thyroid cancer Paternal Uncle    . Breast cancer Cousin 47  . Prostate cancer Cousin        Social History         Socioeconomic History  . Marital status: Married      Spouse name: Not on file  . Number of children: Not on file  . Years of education: Not on file  . Highest education level: Not on file  Occupational History  . Not on file  Tobacco Use  . Smoking status: Never Smoker  . Smokeless tobacco: Never Used  Vaping Use  . Vaping Use: Never used  Substance and Sexual Activity  . Alcohol use: Yes      Comment: occ  . Drug use: Never  . Sexual activity: Yes      Partners: Male      Birth control/protection: Surgical      Comment: Hyst-ovaries remain  Other Topics Concern  . Not on file  Social History Narrative  . Not on file    Social Determinants of Health       Financial Resource Strain:   . Difficulty of Paying Living Expenses: Not on file  Food  Insecurity:   . Worried About Charity fundraiser in the Last Year: Not on file  . Ran Out of Food in the Last Year: Not on file  Transportation Needs:   . Lack of Transportation (Medical): Not on file  . Lack of Transportation (Non-Medical): Not on file  Physical Activity:   . Days of Exercise per Week: Not on file  . Minutes of Exercise per Session: Not on file  Stress:   . Feeling of Stress : Not on file  Social Connections:   . Frequency of Communication with Friends and Family: Not on file  . Frequency of Social Gatherings with Friends and Family: Not on file  . Attends Religious Services: Not on file  . Active Member of Clubs or Organizations: Not on file  . Attends Archivist Meetings: Not on file  . Marital  Status: Not on file  Intimate Partner Violence:   . Fear of Current or Ex-Partner: Not on file  . Emotionally Abused: Not on file  . Physically Abused: Not on file  . Sexually Abused: Not on file      Review of Systems  Skin:       Bruising easily--saw PCP--referring to Hematology  [facility] other systems reviewed and are negative.     PHYSICAL EXAMINATION:     BP 134/80   Pulse 66   Ht 5' 5.25" (1.657 m)   Wt 185 lb (83.9 kg)   LMP  (LMP Unknown)   BMI 30.55 kg/m     General appearance: alert, cooperative and appears stated age Head: Normocephalic, without obvious abnormality, atraumatic Neck: no adenopathy, supple, symmetrical, trachea midline and thyroid normal to inspection and palpation Lungs: clear to auscultation bilaterally Heart: regular rate and rhythm Abdomen: soft, non-tender, no masses,  no organomegaly Extremities: extremities normal, atraumatic, no cyanosis or edema Skin: Skin color, texture, turgor normal. No rashes or lesions Lymph nodes: Cervical, supraclavicular, and inguinal nodes are normal. Neurologic: Grossly normal   Pelvic: External genitalia:  no lesions              Urethra:  normal appearing urethra with no masses,  tenderness or lesions              Bartholins and Skenes: normal                 Vagina: normal appearing vagina with normal color and discharge, no lesions.  First to second degree cystocele and first to second degree rectocele.                   Cervix: no lesions, good support.                Bimanual Exam:  Uterus:  Absent              Adnexa: no mass, fullness, tenderness              Rectal exam: Yes.  .  Confirms.              Anus:  normal sphincter tone, Hemorrhoid noted.    Chaperone was present for exam.   ASSESSMENT   Stress incontinence.  Cystocele and rectocele.  Symptomatic.  Status post laparoscopic supracervical hysterectomy for fibroids and endometriosis.  Ovaries remain. She has an abdominal wall mesh. Menopausal symptoms and atrophy.  On Gabapentin. Mother deceased from breast cancer.  Maternal aunt with breast cancer. Paternal grandfather had breast cancer.  Patient has had negative genetic testing.  Arthritis.  On Plaquinil and Rinvoq.  Dr. Bennie Dallas managing.  Hemorrhoid.      PLAN   We discussed anterior and posterior colporrhaphy with native tissue repair, TVT Exact midurethral sling and cystoscopy.   Benefits and risks of surgery discussed.   Risks of surgery include but are not limited to bleeding, infection, damage to surrounding organs, vaginal pain with sexual activity, permanent mesh use which may cause erosion and exposure in the vagina, urethra, bladder or ureters, dyspareunia, slower voiding and urinary retention, possible need for prolonged catheterization, de novo overactive bladder symptoms, reoperation, recurrence of prolapse and incontinence,  DVT, PE, death, and reaction to anesthesia.     I have discussed surgical expectations regarding the procedures and recovery.    Patient wishes to proceed.   We discussed options for menopausal care - increasing Gabapentin to day time use,  starting SSRI or SNRI, or estrogen therapy.   She will start  Effexor XR 37.5 mg.  Potential side effects reviewed.   Stop Rinoviq 1 week prior to surgery.

## 2020-06-13 NOTE — Anesthesia Preprocedure Evaluation (Addendum)
Anesthesia Evaluation  Patient identified by MRN, date of birth, ID band Patient awake    Reviewed: Allergy & Precautions, H&P , NPO status , Patient's Chart, lab work & pertinent test results  History of Anesthesia Complications (+) PONV, PROLONGED EMERGENCE and history of anesthetic complications  Airway Mallampati: I  TM Distance: >3 FB Neck ROM: Full    Dental no notable dental hx. (+) Teeth Intact, Dental Advisory Given, Caps   Pulmonary asthma ,    Pulmonary exam normal breath sounds clear to auscultation       Cardiovascular Exercise Tolerance: Good negative cardio ROS Normal cardiovascular exam Rhythm:Regular Rate:Normal     Neuro/Psych  Headaches, negative psych ROS   GI/Hepatic Neg liver ROS, GERD  Medicated and Controlled,  Endo/Other  Hypothyroidism   Renal/GU negative Renal ROS  negative genitourinary   Musculoskeletal  (+) Arthritis , Osteoarthritis and Rheumatoid disorders,    Abdominal   Peds negative pediatric ROS (+)  Hematology negative hematology ROS (+)   Anesthesia Other Findings   Reproductive/Obstetrics negative OB ROS                            Anesthesia Physical Anesthesia Plan  ASA: II  Anesthesia Plan: General   Post-op Pain Management:    Induction: Intravenous  PONV Risk Score and Plan: 3 and Ondansetron, Dexamethasone, Treatment may vary due to age or medical condition, Scopolamine patch - Pre-op and Propofol infusion  Airway Management Planned: Oral ETT and LMA  Additional Equipment:   Intra-op Plan:   Post-operative Plan: Extubation in OR  Informed Consent: I have reviewed the patients History and Physical, chart, labs and discussed the procedure including the risks, benefits and alternatives for the proposed anesthesia with the patient or authorized representative who has indicated his/her understanding and acceptance.       Plan  Discussed with: Anesthesiologist and CRNA  Anesthesia Plan Comments: ( )       Anesthesia Quick Evaluation

## 2020-06-14 ENCOUNTER — Ambulatory Visit: Payer: 59 | Admitting: Obstetrics and Gynecology

## 2020-06-14 ENCOUNTER — Encounter (HOSPITAL_BASED_OUTPATIENT_CLINIC_OR_DEPARTMENT_OTHER): Payer: Self-pay | Admitting: Obstetrics and Gynecology

## 2020-06-14 ENCOUNTER — Ambulatory Visit (HOSPITAL_BASED_OUTPATIENT_CLINIC_OR_DEPARTMENT_OTHER): Payer: No Typology Code available for payment source | Admitting: Anesthesiology

## 2020-06-14 ENCOUNTER — Other Ambulatory Visit: Payer: Self-pay | Admitting: Rheumatology

## 2020-06-14 ENCOUNTER — Encounter (HOSPITAL_BASED_OUTPATIENT_CLINIC_OR_DEPARTMENT_OTHER)
Admission: RE | Disposition: A | Discharge status: Home / Self Care | Payer: Self-pay | Source: Home / Self Care | Attending: Obstetrics and Gynecology

## 2020-06-14 ENCOUNTER — Ambulatory Visit (HOSPITAL_BASED_OUTPATIENT_CLINIC_OR_DEPARTMENT_OTHER)
Admission: RE | Admit: 2020-06-14 | Discharge: 2020-06-15 | Disposition: A | Discharge status: Home / Self Care | Payer: No Typology Code available for payment source | Attending: Obstetrics and Gynecology | Admitting: Obstetrics and Gynecology

## 2020-06-14 DIAGNOSIS — Z9889 Other specified postprocedural states: Secondary | ICD-10-CM

## 2020-06-14 DIAGNOSIS — Z79890 Hormone replacement therapy (postmenopausal): Secondary | ICD-10-CM | POA: Diagnosis not present

## 2020-06-14 DIAGNOSIS — N816 Rectocele: Secondary | ICD-10-CM | POA: Insufficient documentation

## 2020-06-14 DIAGNOSIS — M1239 Palindromic rheumatism, multiple sites: Secondary | ICD-10-CM | POA: Insufficient documentation

## 2020-06-14 DIAGNOSIS — Z79899 Other long term (current) drug therapy: Secondary | ICD-10-CM | POA: Diagnosis not present

## 2020-06-14 DIAGNOSIS — N8110 Cystocele, unspecified: Secondary | ICD-10-CM | POA: Insufficient documentation

## 2020-06-14 DIAGNOSIS — J4540 Moderate persistent asthma, uncomplicated: Secondary | ICD-10-CM | POA: Diagnosis not present

## 2020-06-14 DIAGNOSIS — M0609 Rheumatoid arthritis without rheumatoid factor, multiple sites: Secondary | ICD-10-CM

## 2020-06-14 DIAGNOSIS — E039 Hypothyroidism, unspecified: Secondary | ICD-10-CM | POA: Diagnosis not present

## 2020-06-14 DIAGNOSIS — N810 Urethrocele: Secondary | ICD-10-CM | POA: Diagnosis not present

## 2020-06-14 DIAGNOSIS — N393 Stress incontinence (female) (male): Secondary | ICD-10-CM

## 2020-06-14 DIAGNOSIS — Z7951 Long term (current) use of inhaled steroids: Secondary | ICD-10-CM | POA: Diagnosis not present

## 2020-06-14 DIAGNOSIS — N99820 Postprocedural hemorrhage and hematoma of a genitourinary system organ or structure following a genitourinary system procedure: Secondary | ICD-10-CM | POA: Diagnosis not present

## 2020-06-14 DIAGNOSIS — Z8616 Personal history of COVID-19: Secondary | ICD-10-CM | POA: Insufficient documentation

## 2020-06-14 DIAGNOSIS — Z90710 Acquired absence of both cervix and uterus: Secondary | ICD-10-CM | POA: Insufficient documentation

## 2020-06-14 DIAGNOSIS — I447 Left bundle-branch block, unspecified: Secondary | ICD-10-CM | POA: Insufficient documentation

## 2020-06-14 HISTORY — PX: ANTERIOR AND POSTERIOR REPAIR: SHX5121

## 2020-06-14 HISTORY — PX: CYSTOSCOPY: SHX5120

## 2020-06-14 HISTORY — DX: Other specified postprocedural states: R11.2

## 2020-06-14 HISTORY — DX: Other specified postprocedural states: Z98.890

## 2020-06-14 HISTORY — DX: Other specified abnormal findings of blood chemistry: R79.89

## 2020-06-14 HISTORY — DX: Other complications of anesthesia, initial encounter: T88.59XA

## 2020-06-14 HISTORY — PX: BLADDER SUSPENSION: SHX72

## 2020-06-14 LAB — CBC
HCT: 39.2 % (ref 36.0–46.0)
HCT: 36.5 % (ref 36.0–46.0)
Hemoglobin: 13.6 g/dL (ref 12.0–15.0)
Hemoglobin: 12.4 g/dL (ref 12.0–15.0)
MCH: 30.4 pg (ref 26.0–34.0)
MCH: 30 pg (ref 26.0–34.0)
MCHC: 34.7 g/dL (ref 30.0–36.0)
MCHC: 34 g/dL (ref 30.0–36.0)
MCV: 87.7 fL (ref 80.0–100.0)
MCV: 88.2 fL (ref 80.0–100.0)
Platelets: 270 10*3/uL (ref 150–400)
Platelets: 244 10*3/uL (ref 150–400)
RBC: 4.47 MIL/uL (ref 3.87–5.11)
RBC: 4.14 MIL/uL (ref 3.87–5.11)
RDW: 12.4 % (ref 11.5–15.5)
RDW: 12.3 % (ref 11.5–15.5)
WBC: 6.9 10*3/uL (ref 4.0–10.5)
WBC: 12.2 10*3/uL — ABNORMAL HIGH (ref 4.0–10.5)
nRBC: 0 % (ref 0.0–0.2)
nRBC: 0 % (ref 0.0–0.2)

## 2020-06-14 LAB — BASIC METABOLIC PANEL
Anion gap: 11 (ref 5–15)
BUN: 8 mg/dL (ref 6–20)
CO2: 28 mmol/L (ref 22–32)
Calcium: 10 mg/dL (ref 8.9–10.3)
Chloride: 102 mmol/L (ref 98–111)
Creatinine, Ser: 0.81 mg/dL (ref 0.44–1.00)
GFR calc Af Amer: >60 mL/min (ref >60)
GFR calc non Af Amer: >60 mL/min (ref >60)
Glucose, Bld: 95 mg/dL (ref 70–99)
Potassium: 4 mmol/L (ref 3.5–5.1)
Sodium: 141 mmol/L (ref 135–145)

## 2020-06-14 LAB — POCT I-STAT, CHEM 8
BUN: 6 mg/dL (ref 6–20)
Calcium, Ion: 1.28 mmol/L (ref 1.15–1.40)
Chloride: 101 mmol/L (ref 98–111)
Creatinine, Ser: 0.8 mg/dL (ref 0.44–1.00)
Glucose, Bld: 90 mg/dL (ref 70–99)
HCT: 40 % (ref 36.0–46.0)
Hemoglobin: 13.6 g/dL (ref 12.0–15.0)
Potassium: 3.9 mmol/L (ref 3.5–5.1)
Sodium: 140 mmol/L (ref 135–145)
TCO2: 26 mmol/L (ref 22–32)

## 2020-06-14 LAB — HEPATIC FUNCTION PANEL
ALT: 29 U/L (ref 0–44)
AST: 36 U/L (ref 15–41)
Albumin: 4.3 g/dL (ref 3.5–5.0)
Alkaline Phosphatase: 46 U/L (ref 38–126)
Bilirubin, Direct: 0.1 mg/dL (ref 0.0–0.2)
Indirect Bilirubin: 0.3 mg/dL (ref 0.3–0.9)
Total Bilirubin: 0.4 mg/dL (ref 0.3–1.2)
Total Protein: 7.4 g/dL (ref 6.5–8.1)

## 2020-06-14 LAB — ABO/RH: ABO/RH(D): O POS

## 2020-06-14 LAB — TYPE AND SCREEN
ABO/RH(D): O POS
Antibody Screen: NEGATIVE

## 2020-06-14 SURGERY — ANTERIOR (CYSTOCELE) AND POSTERIOR REPAIR (RECTOCELE)
Anesthesia: General | Site: Vagina

## 2020-06-14 MED ORDER — ONDANSETRON HCL 4 MG/2ML IJ SOLN: 4.0000 mg | Freq: Once | INTRAMUSCULAR | Status: DC | PRN

## 2020-06-14 MED ORDER — MIDAZOLAM HCL 2 MG/2ML IJ SOLN
INTRAMUSCULAR | Status: AC
  Filled 2020-06-14: qty 2

## 2020-06-14 MED ORDER — OXYCODONE-ACETAMINOPHEN 5-325 MG PO TABS
ORAL_TABLET | ORAL | Status: AC
  Filled 2020-06-14: qty 2

## 2020-06-14 MED ORDER — OXYCODONE-ACETAMINOPHEN 5-325 MG PO TABS
1.0000 | ORAL_TABLET | ORAL | Status: DC | PRN
  Administered 2020-06-14: 2 via ORAL
  Administered 2020-06-14: 1 via ORAL
  Administered 2020-06-14: 2 via ORAL
  Administered 2020-06-15 (×2): 1 via ORAL

## 2020-06-14 MED ORDER — ONDANSETRON HCL 4 MG/2ML IJ SOLN
INTRAMUSCULAR | Status: AC
  Filled 2020-06-14: qty 2

## 2020-06-14 MED ORDER — DICLOFENAC SODIUM 2 % TD SOLN: 1.0000 "application " | Freq: Every day | TRANSDERMAL | Status: DC | PRN

## 2020-06-14 MED ORDER — MEPERIDINE HCL 25 MG/ML IJ SOLN: 6.2500 mg | INTRAMUSCULAR | Status: DC | PRN

## 2020-06-14 MED ORDER — LIDOCAINE-EPINEPHRINE 1 %-1:100000 IJ SOLN
INTRAMUSCULAR | Status: DC | PRN
  Administered 2020-06-14: 12 mL

## 2020-06-14 MED ORDER — IBUPROFEN 800 MG PO TABS
800.0000 mg | ORAL_TABLET | Freq: Three times a day (TID) | ORAL | Status: DC | PRN
  Administered 2020-06-15: 800 mg via ORAL

## 2020-06-14 MED ORDER — IBUPROFEN 800 MG PO TABS: 800.0000 mg | ORAL_TABLET | Freq: Three times a day (TID) | ORAL | 0 refills | Status: DC | PRN

## 2020-06-14 MED ORDER — GABAPENTIN 400 MG PO CAPS
800.0000 mg | ORAL_CAPSULE | Freq: Every day | ORAL | Status: DC
  Administered 2020-06-14: 800 mg via ORAL
  Filled 2020-06-14: qty 2

## 2020-06-14 MED ORDER — ALBUTEROL SULFATE HFA 108 (90 BASE) MCG/ACT IN AERS: 2.0000 | INHALATION_SPRAY | RESPIRATORY_TRACT | Status: DC | PRN

## 2020-06-14 MED ORDER — LACTATED RINGERS IV SOLN: INTRAVENOUS | Status: DC

## 2020-06-14 MED ORDER — ACETAMINOPHEN 160 MG/5ML PO SOLN: 325.0000 mg | ORAL | Status: DC | PRN

## 2020-06-14 MED ORDER — SODIUM CHLORIDE 0.9 % IV SOLN
2.0000 g | INTRAVENOUS | Status: AC
  Administered 2020-06-14: 2 g via INTRAVENOUS

## 2020-06-14 MED ORDER — PHENYLEPHRINE 40 MCG/ML (10ML) SYRINGE FOR IV PUSH (FOR BLOOD PRESSURE SUPPORT)
PREFILLED_SYRINGE | INTRAVENOUS | Status: DC | PRN
  Administered 2020-06-14 (×2): 80 ug via INTRAVENOUS
  Administered 2020-06-14 (×3): 40 ug via INTRAVENOUS
  Administered 2020-06-14: 80 ug via INTRAVENOUS
  Administered 2020-06-14: 40 ug via INTRAVENOUS

## 2020-06-14 MED ORDER — MOMETASONE FURO-FORMOTEROL FUM 200-5 MCG/ACT IN AERO
2.0000 | INHALATION_SPRAY | Freq: Two times a day (BID) | RESPIRATORY_TRACT | Status: DC
  Filled 2020-06-14: qty 8.8

## 2020-06-14 MED ORDER — OXYCODONE-ACETAMINOPHEN 5-325 MG PO TABS: 1.0000 | ORAL_TABLET | ORAL | 0 refills | Status: DC | PRN

## 2020-06-14 MED ORDER — FENTANYL CITRATE (PF) 100 MCG/2ML IJ SOLN
INTRAMUSCULAR | Status: DC | PRN
  Administered 2020-06-14: 100 ug via INTRAVENOUS
  Administered 2020-06-14: 50 ug via INTRAVENOUS

## 2020-06-14 MED ORDER — PROPOFOL 500 MG/50ML IV EMUL
INTRAVENOUS | Status: DC | PRN
  Administered 2020-06-14: 50 ug/kg/min via INTRAVENOUS

## 2020-06-14 MED ORDER — PROPOFOL 10 MG/ML IV BOLUS
INTRAVENOUS | Status: AC
  Filled 2020-06-14: qty 20

## 2020-06-14 MED ORDER — PROPOFOL 10 MG/ML IV BOLUS
INTRAVENOUS | Status: DC | PRN
  Administered 2020-06-14: 150 mg via INTRAVENOUS

## 2020-06-14 MED ORDER — THROMBIN 5000 UNITS EX KIT
PACK | CUTANEOUS | Status: DC | PRN
  Administered 2020-06-14: 5000 [IU] via TOPICAL

## 2020-06-14 MED ORDER — SUGAMMADEX SODIUM 200 MG/2ML IV SOLN
INTRAVENOUS | Status: DC | PRN
  Administered 2020-06-14: 200 mg via INTRAVENOUS

## 2020-06-14 MED ORDER — FENTANYL CITRATE (PF) 250 MCG/5ML IJ SOLN
INTRAMUSCULAR | Status: AC
  Filled 2020-06-14: qty 5

## 2020-06-14 MED ORDER — OXYCODONE-ACETAMINOPHEN 5-325 MG PO TABS: 1.0000 | ORAL_TABLET | Freq: Once | ORAL | Status: DC

## 2020-06-14 MED ORDER — LEVOTHYROXINE SODIUM 150 MCG PO TABS
150.0000 ug | ORAL_TABLET | ORAL | Status: DC
  Administered 2020-06-15: 150 ug via ORAL
  Filled 2020-06-14: qty 1

## 2020-06-14 MED ORDER — DEXAMETHASONE SODIUM PHOSPHATE 10 MG/ML IJ SOLN
INTRAMUSCULAR | Status: AC
  Filled 2020-06-14: qty 1

## 2020-06-14 MED ORDER — ACETAMINOPHEN 325 MG PO TABS: 325.0000 mg | ORAL_TABLET | ORAL | Status: DC | PRN

## 2020-06-14 MED ORDER — OXYCODONE-ACETAMINOPHEN 5-325 MG PO TABS
ORAL_TABLET | ORAL | Status: AC
Start: 2020-06-14 — End: ?
  Filled 2020-06-14: qty 1

## 2020-06-14 MED ORDER — CYCLOBENZAPRINE HCL 10 MG PO TABS
10.0000 mg | ORAL_TABLET | Freq: Three times a day (TID) | ORAL | Status: DC | PRN
  Filled 2020-06-14: qty 1

## 2020-06-14 MED ORDER — FENTANYL CITRATE (PF) 100 MCG/2ML IJ SOLN: 25.0000 ug | INTRAMUSCULAR | Status: DC | PRN

## 2020-06-14 MED ORDER — MONTELUKAST SODIUM 10 MG PO TABS
10.0000 mg | ORAL_TABLET | Freq: Every day | ORAL | Status: DC
  Filled 2020-06-14: qty 1

## 2020-06-14 MED ORDER — OXYCODONE HCL 5 MG/5ML PO SOLN: 5.0000 mg | Freq: Once | ORAL | Status: DC | PRN

## 2020-06-14 MED ORDER — KETOROLAC TROMETHAMINE 30 MG/ML IJ SOLN
INTRAMUSCULAR | Status: AC
  Filled 2020-06-14: qty 1

## 2020-06-14 MED ORDER — ACETAMINOPHEN 500 MG PO TABS
ORAL_TABLET | ORAL | Status: AC
  Filled 2020-06-14: qty 2

## 2020-06-14 MED ORDER — OXYCODONE HCL 5 MG PO TABS: 5.0000 mg | ORAL_TABLET | Freq: Once | ORAL | Status: DC | PRN

## 2020-06-14 MED ORDER — LORATADINE 10 MG PO TABS
ORAL_TABLET | ORAL | Status: AC
  Filled 2020-06-14: qty 1

## 2020-06-14 MED ORDER — DEXAMETHASONE SODIUM PHOSPHATE 10 MG/ML IJ SOLN
INTRAMUSCULAR | Status: DC | PRN
  Administered 2020-06-14: 10 mg via INTRAVENOUS

## 2020-06-14 MED ORDER — ONDANSETRON HCL 4 MG PO TABS: 4.0000 mg | ORAL_TABLET | Freq: Four times a day (QID) | ORAL | Status: DC | PRN

## 2020-06-14 MED ORDER — KETOROLAC TROMETHAMINE 30 MG/ML IJ SOLN
INTRAMUSCULAR | Status: DC | PRN
  Administered 2020-06-14: 30 mg via INTRAVENOUS

## 2020-06-14 MED ORDER — SCOPOLAMINE 1 MG/3DAYS TD PT72
1.0000 | MEDICATED_PATCH | TRANSDERMAL | Status: DC
  Administered 2020-06-14: 1.5 mg via TRANSDERMAL

## 2020-06-14 MED ORDER — SODIUM CHLORIDE 0.9 % IR SOLN
Status: DC | PRN
  Administered 2020-06-14: 400 mL via INTRAVESICAL

## 2020-06-14 MED ORDER — ROSUVASTATIN CALCIUM 5 MG PO TABS
2.5000 mg | ORAL_TABLET | Freq: Every day | ORAL | Status: DC
  Filled 2020-06-14: qty 0.5

## 2020-06-14 MED ORDER — MENTHOL 3 MG MT LOZG
LOZENGE | OROMUCOSAL | Status: AC
  Filled 2020-06-14: qty 9

## 2020-06-14 MED ORDER — PROPOFOL 10 MG/ML IV BOLUS
INTRAVENOUS | Status: AC
  Filled 2020-06-14: qty 40

## 2020-06-14 MED ORDER — SCOPOLAMINE 1 MG/3DAYS TD PT72
MEDICATED_PATCH | TRANSDERMAL | Status: AC
  Filled 2020-06-14: qty 1

## 2020-06-14 MED ORDER — SODIUM CHLORIDE 0.9 % IV SOLN
INTRAVENOUS | Status: AC
  Filled 2020-06-14: qty 2

## 2020-06-14 MED ORDER — ROCURONIUM BROMIDE 10 MG/ML (PF) SYRINGE
PREFILLED_SYRINGE | INTRAVENOUS | Status: DC | PRN
  Administered 2020-06-14 (×2): 10 mg via INTRAVENOUS
  Administered 2020-06-14: 20 mg via INTRAVENOUS
  Administered 2020-06-14: 50 mg via INTRAVENOUS

## 2020-06-14 MED ORDER — LIDOCAINE 2% (20 MG/ML) 5 ML SYRINGE
INTRAMUSCULAR | Status: AC
  Filled 2020-06-14: qty 5

## 2020-06-14 MED ORDER — OXYCODONE-ACETAMINOPHEN 5-325 MG PO TABS
ORAL_TABLET | ORAL | Status: AC
  Filled 2020-06-14: qty 1

## 2020-06-14 MED ORDER — INDIGOTINDISULFONATE SODIUM 8 MG/ML IJ SOLN
INTRAMUSCULAR | Status: DC | PRN
  Administered 2020-06-14: 40 mg via INTRAVENOUS

## 2020-06-14 MED ORDER — ONDANSETRON HCL 4 MG/2ML IJ SOLN
INTRAMUSCULAR | Status: DC | PRN
  Administered 2020-06-14: 4 mg via INTRAVENOUS

## 2020-06-14 MED ORDER — LEVOTHYROXINE SODIUM 175 MCG PO TABS: 175.0000 ug | ORAL_TABLET | ORAL | Status: DC

## 2020-06-14 MED ORDER — VENLAFAXINE HCL ER 37.5 MG PO CP24
37.5000 mg | ORAL_CAPSULE | Freq: Every day | ORAL | Status: DC
  Filled 2020-06-14: qty 1

## 2020-06-14 MED ORDER — PHENYLEPHRINE 40 MCG/ML (10ML) SYRINGE FOR IV PUSH (FOR BLOOD PRESSURE SUPPORT)
PREFILLED_SYRINGE | INTRAVENOUS | Status: AC
  Filled 2020-06-14: qty 10

## 2020-06-14 MED ORDER — ACETAMINOPHEN 325 MG PO TABS
ORAL_TABLET | ORAL | Status: DC | PRN
  Administered 2020-06-14: 1000 mg via ORAL

## 2020-06-14 MED ORDER — MONTELUKAST SODIUM 10 MG PO TABS
10.0000 mg | ORAL_TABLET | Freq: Every day | ORAL | Status: DC
  Administered 2020-06-15: 10 mg via ORAL
  Filled 2020-06-14: qty 1

## 2020-06-14 MED ORDER — ROCURONIUM BROMIDE 10 MG/ML (PF) SYRINGE
PREFILLED_SYRINGE | INTRAVENOUS | Status: AC
  Filled 2020-06-14: qty 10

## 2020-06-14 MED ORDER — LORATADINE 10 MG PO TABS
10.0000 mg | ORAL_TABLET | Freq: Every day | ORAL | Status: DC
  Administered 2020-06-14: 10 mg via ORAL

## 2020-06-14 MED ORDER — MENTHOL 3 MG MT LOZG: 1.0000 | LOZENGE | OROMUCOSAL | Status: DC | PRN

## 2020-06-14 MED ORDER — VITAMIN D3 25 MCG PO TABS
5000.0000 [IU] | ORAL_TABLET | Freq: Every day | ORAL | Status: DC
  Filled 2020-06-14: qty 5

## 2020-06-14 MED ORDER — LEVOTHYROXINE SODIUM 175 MCG PO TABS
175.0000 ug | ORAL_TABLET | ORAL | Status: DC
  Filled 2020-06-14: qty 1

## 2020-06-14 MED ORDER — ONDANSETRON HCL 4 MG/2ML IJ SOLN: 4.0000 mg | Freq: Four times a day (QID) | INTRAMUSCULAR | Status: DC | PRN

## 2020-06-14 MED ORDER — LIDOCAINE 2% (20 MG/ML) 5 ML SYRINGE
INTRAMUSCULAR | Status: DC | PRN
  Administered 2020-06-14: 80 mg via INTRAVENOUS

## 2020-06-14 MED ORDER — HYDROXYCHLOROQUINE SULFATE 200 MG PO TABS
200.0000 mg | ORAL_TABLET | Freq: Two times a day (BID) | ORAL | Status: DC
  Administered 2020-06-14: 200 mg via ORAL
  Filled 2020-06-14 (×2): qty 1

## 2020-06-14 MED FILL — Thrombin For Soln 5000 Unit: CUTANEOUS | Qty: 5000 | Status: AC

## 2020-06-14 SURGICAL SUPPLY — 82 items
ADH SKN CLS APL DERMABOND .7 (GAUZE/BANDAGES/DRESSINGS) ×3
AGENT HMST KT MTR STRL THRMB (HEMOSTASIS) ×3
BAG DRN RND TRDRP ANRFLXCHMBR (UROLOGICAL SUPPLIES)
BAG RETRIEVAL 10 (BASKET)
BAG RETRIEVAL 10MM (BASKET)
BAG URINE DRAIN 2000ML AR STRL (UROLOGICAL SUPPLIES) IMPLANT
BLADE SURG 11 STRL SS (BLADE) ×5 IMPLANT
BLADE SURG 15 STRL LF DISP TIS (BLADE) ×3 IMPLANT
BLADE SURG 15 STRL SS (BLADE) ×5
CABLE HIGH FREQUENCY MONO STRZ (ELECTRODE) IMPLANT
CANISTER SUCT 3000ML PPV (MISCELLANEOUS) ×5 IMPLANT
CATH FOLEY 2WAY SLVR  5CC 18FR (CATHETERS) ×2
CATH FOLEY 2WAY SLVR 5CC 18FR (CATHETERS) ×3 IMPLANT
COVER WAND RF STERILE (DRAPES) ×5 IMPLANT
DECANTER SPIKE VIAL GLASS SM (MISCELLANEOUS) ×5 IMPLANT
DERMABOND ADVANCED (GAUZE/BANDAGES/DRESSINGS) ×2
DERMABOND ADVANCED .7 DNX12 (GAUZE/BANDAGES/DRESSINGS) ×3 IMPLANT
DEVICE CAPIO SLIM SINGLE (INSTRUMENTS) IMPLANT
DRSG COVADERM PLUS 2X2 (GAUZE/BANDAGES/DRESSINGS) IMPLANT
DRSG OPSITE POSTOP 3X4 (GAUZE/BANDAGES/DRESSINGS) IMPLANT
DURAPREP 26ML APPLICATOR (WOUND CARE) IMPLANT
GAUZE 4X4 16PLY RFD (DISPOSABLE) ×10 IMPLANT
GLOVE BIO SURGEON STRL SZ 6.5 (GLOVE) ×8 IMPLANT
GLOVE BIO SURGEONS STRL SZ 6.5 (GLOVE) ×2
GLOVE BIOGEL PI IND STRL 7.0 (GLOVE) ×9 IMPLANT
GLOVE BIOGEL PI INDICATOR 7.0 (GLOVE) ×6
GLOVE ECLIPSE 6.5 STRL STRAW (GLOVE) ×5 IMPLANT
GOWN STRL REUS W/TWL LRG LVL3 (GOWN DISPOSABLE) ×10 IMPLANT
GOWN STRL REUS W/TWL XL LVL3 (GOWN DISPOSABLE) ×5 IMPLANT
HOLDER FOLEY CATH W/STRAP (MISCELLANEOUS) ×5 IMPLANT
LIGASURE VESSEL 5MM BLUNT TIP (ELECTROSURGICAL) IMPLANT
NEEDLE HYPO 22GX1.5 SAFETY (NEEDLE) ×5 IMPLANT
NEEDLE INSUFFLATION 120MM (ENDOMECHANICALS) IMPLANT
NEEDLE MAYO 6 CRC TAPER PT (NEEDLE) IMPLANT
NS IRRIG 1000ML POUR BTL (IV SOLUTION) ×5 IMPLANT
PACK LAPAROSCOPY BASIN (CUSTOM PROCEDURE TRAY) IMPLANT
PACK TRENDGUARD 450 HYBRID PRO (MISCELLANEOUS) ×3 IMPLANT
PACK VAGINAL WOMENS (CUSTOM PROCEDURE TRAY) ×10 IMPLANT
PACKING VAGINAL (PACKING) IMPLANT
PLUG CATH AND CAP STER (CATHETERS) ×5 IMPLANT
POUCH LAPAROSCOPIC INSTRUMENT (MISCELLANEOUS) IMPLANT
PROTECTOR NERVE ULNAR (MISCELLANEOUS) IMPLANT
SCISSORS LAP 5X35 DISP (ENDOMECHANICALS) IMPLANT
SET IRRIG Y TYPE TUR BLADDER L (SET/KITS/TRAYS/PACK) ×5 IMPLANT
SET SUCTION IRRIG HYDROSURG (IRRIGATION / IRRIGATOR) IMPLANT
SET TUBE SMOKE EVAC HIGH FLOW (TUBING) IMPLANT
SHEARS HARMONIC ACE PLUS 36CM (ENDOMECHANICALS) IMPLANT
SHEET LAVH (DRAPES) IMPLANT
SLEEVE ADV FIXATION 5X100MM (TROCAR) IMPLANT
SLING TVT EXACT (Sling) ×5 IMPLANT
SPONGE SURGIFOAM ABS GEL 12-7 (HEMOSTASIS) IMPLANT
SURGIFLO W/THROMBIN 8M KIT (HEMOSTASIS) ×5 IMPLANT
SUT CAPIO ETHIBPND (SUTURE) IMPLANT
SUT PLAIN 3 0 FS 2 27 (SUTURE) IMPLANT
SUT VIC AB 0 CT1 18XCR BRD8 (SUTURE) ×3 IMPLANT
SUT VIC AB 0 CT1 27 (SUTURE) ×10
SUT VIC AB 0 CT1 27XBRD ANBCTR (SUTURE) ×6 IMPLANT
SUT VIC AB 0 CT1 8-18 (SUTURE) ×5
SUT VIC AB 2-0 CT1 27 (SUTURE)
SUT VIC AB 2-0 CT1 TAPERPNT 27 (SUTURE) IMPLANT
SUT VIC AB 2-0 CT2 27 (SUTURE) IMPLANT
SUT VIC AB 2-0 SH 27 (SUTURE) ×20
SUT VIC AB 2-0 SH 27XBRD (SUTURE) ×12 IMPLANT
SUT VIC AB 2-0 UR6 27 (SUTURE) IMPLANT
SUT VIC AB 4-0 SH 27 (SUTURE)
SUT VIC AB 4-0 SH 27XANBCTRL (SUTURE) IMPLANT
SUT VICRYL 0 UR6 27IN ABS (SUTURE) IMPLANT
SYR BULB EAR ULCER 3OZ GRN STR (SYRINGE) IMPLANT
SYR TOOMEY IRRIG 70ML (MISCELLANEOUS) ×5
SYRINGE TOOMEY IRRIG 70ML (MISCELLANEOUS) ×3 IMPLANT
SYS BAG RETRIEVAL 10MM (BASKET)
SYSTEM BAG RETRIEVAL 10MM (BASKET) IMPLANT
SYSTEM CARTER THOMASON II (TROCAR) IMPLANT
TOWEL OR 17X26 10 PK STRL BLUE (TOWEL DISPOSABLE) ×5 IMPLANT
TRAY FOLEY W/BAG SLVR 14FR LF (SET/KITS/TRAYS/PACK) ×5 IMPLANT
TRENDGUARD 450 HYBRID PRO PACK (MISCELLANEOUS) ×5
TROCAR ADV FIXATION 5X100MM (TROCAR) IMPLANT
TROCAR BLADELESS OPT 5 100 (ENDOMECHANICALS) IMPLANT
TROCAR XCEL NON-BLD 11X100MML (ENDOMECHANICALS) IMPLANT
TUBE CONNECTING 12'X1/4 (SUCTIONS) ×1
TUBE CONNECTING 12X1/4 (SUCTIONS) ×4 IMPLANT
WARMER LAPAROSCOPE (MISCELLANEOUS) IMPLANT

## 2020-06-14 NOTE — Anesthesia Procedure Notes (Signed)
Procedure Name: Intubation Date/Time: 06/14/2020 7:30 AM Performed by: Rogers Blocker, CRNA Pre-anesthesia Checklist: Patient identified, Emergency Drugs available, Suction available and Patient being monitored Patient Re-evaluated:Patient Re-evaluated prior to induction Oxygen Delivery Method: Circle System Utilized Preoxygenation: Pre-oxygenation with 100% oxygen Induction Type: IV induction Ventilation: Mask ventilation without difficulty Laryngoscope Size: Mac and 3 Grade View: Grade I Tube type: Oral Tube size: 7.0 mm Number of attempts: 1 Airway Equipment and Method: Stylet and Oral airway Placement Confirmation: ETT inserted through vocal cords under direct vision,  positive ETCO2 and breath sounds checked- equal and bilateral Secured at: 21 cm Tube secured with: Tape Dental Injury: Teeth and Oropharynx as per pre-operative assessment

## 2020-06-14 NOTE — Anesthesia Postprocedure Evaluation (Signed)
Anesthesia Post Note  Patient: [name]  Procedure(s) Performed: ANTERIOR (CYSTOCELE) AND POSTERIOR REPAIR (RECTOCELE) (N/A Vagina ) TRANSVAGINAL TAPE (TVT) PROCEDURE/EXACT MIDURETHRAL SLING (N/A Vagina ) CYSTOSCOPY (N/A Bladder)     Patient location during evaluation: PACU Anesthesia Type: General Level of consciousness: awake and alert Pain management: pain level controlled Vital Signs Assessment: post-procedure vital signs reviewed and stable Respiratory status: spontaneous breathing, nonlabored ventilation, respiratory function stable and patient connected to nasal cannula oxygen Cardiovascular status: blood pressure returned to baseline and stable Postop Assessment: no apparent nausea or vomiting Anesthetic complications: no   No complications documented.  Last Vitals:  Vitals:   06/14/20 0605 06/14/20 1020  BP: (!) 143/96 (!) 146/80  Pulse: 74 84  Resp: 16 10  Temp: 36.8 C (!) 36.1 C  SpO2: 100% 100%    Last Pain:  Vitals:   06/14/20 1020  TempSrc:   PainSc: 0-No pain                 Antony Sian

## 2020-06-14 NOTE — Progress Notes (Signed)
Day of Surgery Procedure(s) (LRB): ANTERIOR (CYSTOCELE) AND POSTERIOR REPAIR (RECTOCELE) (N/A) TRANSVAGINAL TAPE (TVT) PROCEDURE/EXACT MIDURETHRAL SLING (N/A) CYSTOSCOPY (N/A)  Subjective: Called by PACU nurse to see patient for increased post op vaginal bleeding.  Patient has saturated 1 and 1/2 ice pack pads since surgery.  She was able to void 250 cc.   Ready to take a Percocet now.   Patient saw PCP and hematology preop due to increased bruising.  She had a normal CBC and PT. No further work up was deemed necessary.   Objective: I have reviewed patient's vital signs and intake and output. Vitals:   06/14/20 1137 06/14/20 1250  BP: 137/87 (!) 143/79  Pulse: 76 80  Resp: 16 15  Temp: (!) 97.5 F (36.4 C) 98.6 F (37 C)  SpO2: 100% 100%   I/O  - 1650 cc/500 cc  General: alert and cooperative  Lower abdomen:  SP incisions intact and slightly tender.  No ecchymoses.  Vaginal pad - 1/5 stained.  Vaginal palpation - no mass or hematoma noted.  Small amount of clot seen at introitus and then small amount of dark blood with digital palpation.  Sterile small gauze roll with Estrace cream placed in vagina.  Patient tolerated well.   Assessment: s/p Procedure(s) with comments: ANTERIOR (CYSTOCELE) AND POSTERIOR REPAIR (RECTOCELE) (N/A) TRANSVAGINAL TAPE (TVT) PROCEDURE/EXACT MIDURETHRAL SLING (N/A) - EXACT MIDURETHRAL SLING CYSTOSCOPY (N/A):  Post op vaginal bleeding.   Plan: Will observe in short term stay unit and reassess this evening.   Continue vaginal packing.  OK for advance to regular diet and use of oral pain medications.  Will check CBC at 16:00.    LOS: 0 days    Arloa Koh 06/14/2020, 1:01 PM

## 2020-06-14 NOTE — Progress Notes (Signed)
Update to History and Physical  No marked change in status since office preop visit.  Patient examined.  OK to proceed with surgery.   Patient will have an anterior and posterior colporrhaphy, tension free tape midurethral sling, and cystoscopy.

## 2020-06-14 NOTE — Progress Notes (Signed)
Patient walked to [place] and report given to Obie Dredge, RN and Danny Lawless, RN in [place].

## 2020-06-14 NOTE — Telephone Encounter (Signed)
Last Visit: 06/01/2020 Next Visit: 10/18/2020 Labs: 06/14/2020 WNL  Current Dose per office note 06/01/2020:  Rinvoq 15 mg daily DX: Rheumatoid arthritis of multiple sites with negative rheumatoid factor  Okay to refill per Dr. Estanislado Pandy

## 2020-06-14 NOTE — Progress Notes (Signed)
Day of Surgery Procedure(s) (LRB): ANTERIOR (CYSTOCELE) AND POSTERIOR REPAIR (RECTOCELE) (N/A) TRANSVAGINAL TAPE (TVT) PROCEDURE/EXACT MIDURETHRAL SLING (N/A) CYSTOSCOPY (N/A)  Subjective: Patient reports no problems voiding.   Tolerating food.  Took a Percocet to treat discomfort related to the packing/tampon.   Changes two pads for bleeding - one 3/4 full and the other 1/3 full.   Objective: I have reviewed patient's vital signs, intake and output and labs. Vitals:   06/14/20 1250 06/14/20 1347  BP: (!) 143/79 (!) 154/85  Pulse: 80 83  Resp: 15 16  Temp: 98.6 F (37 C)   SpO2: 100% 99%   I/O - 1990 cc/2250 cc.   CBC    Component Value Date/Time   WBC 12.2 (H) 06/14/2020 1609   RBC 4.14 06/14/2020 1609   HGB 12.4 06/14/2020 1609   HCT 36.5 06/14/2020 1609   PLT 244 06/14/2020 1609   MCV 88.2 06/14/2020 1609   MCH 30.0 06/14/2020 1609   MCHC 34.0 06/14/2020 1609   RDW 12.3 06/14/2020 1609   LYMPHSABS 2,935 05/14/2020 1358   MONOABS 0.4 11/28/2019 1019   EOSABS 27 05/14/2020 1358   BASOSABS 40 05/14/2020 1358     General: alert and cooperative Resp: clear to auscultation bilaterally Cardio: regular rate and rhythm, S1, S2 normal, no murmur, click, rub or gallop GI: soft, non-tender; bowel sounds normal; no masses,  no organomegaly and incision: SP incisions intact and ecchymoses  present. Vaginal Bleeding: minimal  Assessment: s/p Procedure(s) with comments: ANTERIOR (CYSTOCELE) AND POSTERIOR REPAIR (RECTOCELE) (N/A) TRANSVAGINAL TAPE (TVT) PROCEDURE/EXACT MIDURETHRAL SLING (N/A) - EXACT MIDURETHRAL SLING CYSTOSCOPY (N/A):  ecchymoses and mild vaginal bleeding.  Patient with tendency for bruising.  Undetermined etiology.   Plan: Continue inpatient care.  Packing overnight.  Will check CBC in the am.    LOS: 0 days    Arloa Koh 06/14/2020, 5:34 PM

## 2020-06-14 NOTE — Transfer of Care (Signed)
Immediate Anesthesia Transfer of Care Note  Patient: [name]  Procedure(s) Performed: ANTERIOR (CYSTOCELE) AND POSTERIOR REPAIR (RECTOCELE) (N/A Vagina ) TRANSVAGINAL TAPE (TVT) PROCEDURE/EXACT MIDURETHRAL SLING (N/A Vagina ) CYSTOSCOPY (N/A Bladder)  Patient Location: PACU  Anesthesia Type:General  Level of Consciousness: awake, alert , oriented, drowsy and patient cooperative  Airway & Oxygen Therapy: Patient Spontanous Breathing and Patient connected to nasal cannula oxygen  Post-op Assessment: Report given to RN and Post -op Vital signs reviewed and stable  Post vital signs: Reviewed and stable  Last Vitals:  Vitals Value Taken Time  BP 146/80 06/14/20 1019  Temp    Pulse 93 06/14/20 1022  Resp 14 06/14/20 1022  SpO2 100 % 06/14/20 1022  Vitals shown include unvalidated device data.  Last Pain:  Vitals:   06/14/20 0605  TempSrc: Oral  PainSc: 0-No pain      Patients Stated Pain Goal: 7 (24/58/09 9833)  Complications: No complications documented.

## 2020-06-14 NOTE — Op Note (Addendum)
OPERATIVE REPORT  PREOPERATIVE DIAGNOSES:  Cystocele, rectocele, genuine stress incontinence.  POSTOPERATIVE DIAGNOSES:  Cystocele, rectocele, genuine stress Incontinence, possible bladder mucosal polyp  PROCEDURES:  Anterior and posterior colporrhaphy, TVT Exact midurethral sling and cystoscopy.  SURGEON:  Lenard Galloway, M.D.  ASSISTANT:   Edwinna Areola, M.D.  ANESTHESIA:  General endotracheal, local with 1% lidocaine with epinephrine, 1:100,000.  EBL:  100 cc  URINE OUTPUT:   150 cc  IV FLUIDS:   540 cc  COMPLICATIONS:  None.  INDICATIONS FOR THE PROCEDURE:  The patient is a 54 year old para  Caucasian female, status post supracervical hysterectomy  who presents  with pelvic organ prolapse.  On physical exam, the patient was noted to have  a second degree cystocele and a second degree rectocele.  The patient is  experiencing urinary incontinence.  The patient underwent multichannel  urodynamic testing and she was diagnosed with genuine stress incontinence.   The patient desires surgical repair and a plan is made to proceed now with an  anterior and posterior colporrhaphy along with the TVT Exact midurethral sling  and cystoscopy.  Risks, benefits, and alternatives are reviewed with the patient,  who wishes to proceed.  FINDINGS:  Examination under anesthesia revealed   The bladder was visualized throughout 360 degrees.  There was a small possible  polyp of the right trigone near the ureteral opening.  This measured about 4 mm  in size.  A picture is taken and will be scanned in to Epic.  There  was no foreign  body in the bladder or the urethra.  The ureters were noted to be patent bilaterally.  SPECIMENS:  None.  DESCRIPTION OF PROCEDURE:  The patient was reidentified in the preoperative hold area.  She received Cefotetan IV for antibiotic prophylaxis. She received TED hose and PAS stockings for DVT prophylaxis.  The patient was transferred to the operating room  where she was placed in the dorsal lithotomy position with Allen stirrups.  General endotracheal anesthesia was induced.  The patient's lower abdomen, vagina and perineum were then sterilely prepped and she was draped.  A Foley catheter was sterilely placed inside the bladder and left to gravity drainage throughout the procedure.  It was removed at the end of the surgery.  An examination under anesthesia was performed.  Allis clamps were used to mark the anterior vaginal wall from 1 cm below the urethra to the cervix.  The anterior vaginal wall mucosa was injected locally with 1% lidocaine with epinephrine, 1:100,000.  The vaginal mucosa was then incised vertically in the midline with the scalpel.  With a combination of sharp and blunt dissection, the subvaginal tissue was dissected off the bladder bilaterally.  The dissection was carried back to the pubic rami anteriorly. The anterior  colporrhaphy was performed with vertical mattress sutures of 0 Vicryl for reduction of the cystocele.  The TVT Exact midurethral sling was performed.  The 1 cm suprapubic incisions were created with a scalpel to the right and left of the midline.  The TVT Exact was performed in a bottom-up fashion.  The Foley catheter was removed and the Foley tip with the obturator guide was placed inside the urethra and deflected properly.  The guide was placed through the right retropubic space and then up through the right suprapubic incision.  This was performed without difficulty.  The urethra was deflected in opposite direction and the same was then performed on the patient's left-hand side.  The obturator guide was  removed and cystoscopy was performed and the findings were as noted above.  All cystoscopic fluid was drained and the Foley catheter was replaced. The sling was brought up through the suprapubic incisions bilaterally. A Kelly clamp was placed between the sling and the urethra, and the plastic  sheaths were removed.  The sling was trimmed suprapubically. The sling was noted to be in good position.   A small bleeding vein over the right side of the colporrhaphy was coagulated with monopolar cautery.  Surgiflo was then placed over the anterior colporrhaphy  repair and up near the sites where the midurethral sling went into the retropubic  space.  Hemostasis was good.  The anterior vaginal wall mucosa was trimmed and then the anterior vaginal wall was closed with a running locked suture of 2-0 Vicryl.  The posterior colporrhaphy was performed next.  Allis clamps were used to mark the perineal body and then the posterior vaginal mucosa up to cervix.  The perineal body and mucosa were injected locally with 1% lidocaine with epinephrine, 1:100,000.  A triangular wedge of epithelium was excised from the perineal body and the posterior mucosa was incised vertically in the midline with a Metzenbaum scissors.  Sharp and blunt dissection were used to dissect the perirectal fascia off the vaginal mucosa bilaterally.  The rectocele was reduced.  A 2-0 Vicryl pursestring suture was  placed at the very top of the rectocele repair. Vertical mattress sutures of 0  Vicryl were then used.  The rectocele reduced nicely.  Excess vaginal mucosa  was trimmed and the posterior vaginal wall was closed with a running lock suture  of 2-0 Vicryl down to the hymen.  This suture was then brought behind the hymen.  A crown stitch of 0 Vicryl was placed.  The 2-0 Vicryl was used to then place running sutures along the transverse superficial perineal muscles.  This suture was then brought up the perineum in a subcuticular fashion and the knot was tied at the hymen as for an episiotomy-type repair.  Rectal exam was performed and there was no evidence of any suture or foreign body in the rectum.  There was good support and elevation to the anterior apical and posterior vaginal walls.  A gauze packing  with Estrace cream was placed inside the vagina.  The suprapubic incisions were closed with Dermabond.  The bladder was retrograde filled with 200 cc normal saline, and the Foley catheter was removed.   The patient was awakened and extubated, and escorted to the recovery room in stable condition.  There were no complications.  All needle, instrument, and sponge counts were correct.  An MD assistant was necessary for tissue manipulation, retraction and positioning due to the complexity of the case.  Lenard Galloway, M.D.

## 2020-06-14 NOTE — Telephone Encounter (Signed)
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 Estanislado Pandy

## 2020-06-14 NOTE — Progress Notes (Signed)
Dr. Quincy Simmonds by to assess patient. Vaginal packing with estrace cream inserted by Dr. Quincy Simmonds. Plan for monitoring and observation until she returns to see patient this afternoon and will determine when to discharge patient at that time.

## 2020-06-15 ENCOUNTER — Encounter (HOSPITAL_BASED_OUTPATIENT_CLINIC_OR_DEPARTMENT_OTHER): Payer: Self-pay | Admitting: Obstetrics and Gynecology

## 2020-06-15 ENCOUNTER — Encounter: Payer: Self-pay | Admitting: Gastroenterology

## 2020-06-15 DIAGNOSIS — N8110 Cystocele, unspecified: Secondary | ICD-10-CM | POA: Diagnosis not present

## 2020-06-15 LAB — CBC
HCT: 33.3 % — ABNORMAL LOW (ref 36.0–46.0)
Hemoglobin: 11.3 g/dL — ABNORMAL LOW (ref 12.0–15.0)
MCH: 30.1 pg (ref 26.0–34.0)
MCHC: 33.9 g/dL (ref 30.0–36.0)
MCV: 88.8 fL (ref 80.0–100.0)
Platelets: 238 10*3/uL (ref 150–400)
RBC: 3.75 MIL/uL — ABNORMAL LOW (ref 3.87–5.11)
RDW: 12.5 % (ref 11.5–15.5)
WBC: 10.9 10*3/uL — ABNORMAL HIGH (ref 4.0–10.5)
nRBC: 0 % (ref 0.0–0.2)

## 2020-06-15 MED ORDER — IBUPROFEN 800 MG PO TABS
ORAL_TABLET | ORAL | Status: AC
  Filled 2020-06-15: qty 1

## 2020-06-15 MED ORDER — OXYCODONE-ACETAMINOPHEN 5-325 MG PO TABS
ORAL_TABLET | ORAL | Status: AC
Start: 2020-06-15 — End: ?
  Filled 2020-06-15: qty 1

## 2020-06-15 MED ORDER — OXYCODONE-ACETAMINOPHEN 5-325 MG PO TABS
ORAL_TABLET | ORAL | Status: AC
  Filled 2020-06-15: qty 1

## 2020-06-15 NOTE — Discharge Instructions (Signed)
Hi [name],   Your surgery went well today! I performed the anterior and posterior colporrhaphy and the TVT Exact midurethral sling with cystoscopy.  I found a tiny area in your bladder that looks like a possible polyp. I took pictures.   I will make a referral for you to see a urologist as an outpatient.  Please do not take Ibuprofen for post operative pain.  I want to reduce the possibility you have additional bleeding or bruising.   Josefa Half, MD  Anterior and Posterior Colporrhaphy and Sling Procedure, Care After This sheet gives you information about how to care for yourself after your procedure. Your health care provider may also give you more specific instructions. If you have problems or questions, contact your health care provider. What can I expect after the procedure? After the procedure, it is common to have:  Pain in the surgical area.  Vaginal discharge. You will need to use a sanitary pad during this time.  Fatigue. Follow these instructions at home: Incision care   Follow instructions from your health care provider about how to take care of your incision. Make sure you: ? Wash your hands with soap and water before touching the incision area. If soap and water are not available, use hand sanitizer. ? Clean your incision as told by your health care provider. ? Leave stitches (sutures), skin glue, or adhesive strips in place. These skin closures may need to stay in place for 2 weeks or longer. If adhesive strip edges start to loosen and curl up, you may trim the loose edges. Do not remove adhesive strips completely unless your health care provider tells you to do that.  Check your incision area every day for signs of infection. Check for: ? Redness, swelling, or pain. ? Fluid or blood. ? Warmth. ? Pus or a bad smell.  Check your incision every day to make sure the incision area is not separating or opening.  Do not take baths, swim, or use a hot tub until your  health care provider approves. You may shower.  Keep the area between your vagina and rectum (perineal area) clean and dry. Make sure you clean the area after each bowel movement and each time you urinate.  Ask your health care provider if you can take a sitz bath or sit in a tub of clean, warm water. Activity  Do gentle, daily activity as told by your health care provider. You may be told to take short walks every day and go farther each time. Ask your health care provider what activities are safe for you.  Limit stair climbing to once or twice a day in the first week, then slowly increase this activity.  Do not lift anything that is heavier than 10 lbs. (4.5 kg), or the limit that your health care provider tells you, until he or she says that it is safe. Avoid pushing or pulling motions.  Avoid standing for long periods of time.  Do not douche, use tampons, or have sex until your health care provider says it is okay.  Do not drive or use heavy machinery while taking prescription pain medicine. To prevent constipation  To prevent or treat constipation while you are taking prescription pain medicine, your health care provider may recommend that you: ? Take over-the-counter or prescription medicines. ? Eat foods that are high in fiber, such as fresh fruits and vegetables, whole grains, and beans. ? Drink enough fluid to keep your urine clear or pale yellow. ?  Limit foods that are high in fat and processed sugars, such as fried and sweet foods. General instructions  You may be instructed to do pelvic floor exercises (kegels) as told by your health care provider.  Take over-the-counter and prescription medicines only as told by your health care provider.  Keep all follow-up visits as told by your health care provider. This is important. Contact a health care provider if:  Medicine does not help your pain.  You have frequent or urgent urination, or you are unable to completely empty  your bladder.  You feel a burning sensation when urinating.  You have fluid or blood coming from your incision.  You have pus or a bad smell coming from the incision.  Your incision feels warm to the touch.  You have redness, swelling, or pain around your incision. Get help right away if:  You have a fever or chills.  Your incision separates or opens.  You cannot urinate.  You have trouble breathing. Summary  After the procedure, it is common to have pain, fatigue, and discharge from the vagina.  Keep the area between your vagina and rectum (perineal area) clean and dry. Make sure you clean the area after each bowel movement and each time you urinate.  Follow instructions from your health care provider on any activity restrictions after the procedure. This information is not intended to replace advice given to you by your health care provider. Make sure you discuss any questions you have with your health care provider. Document Revised: 08/17/2017 Document Reviewed: 09/04/2016 Elsevier Patient Education  [place] Instructions  Activity: Get plenty of rest for the remainder of the day. A responsible adult should stay with you for 24 hours following the procedure.  For the next 24 hours, DO NOT: -Drive a car -Paediatric nurse -Drink alcoholic beverages -Take any medication unless instructed by your physician -Make any legal decisions or sign important papers.  Meals: Start with liquid foods such as gelatin or soup. Progress to regular foods as tolerated. Avoid greasy, spicy, heavy foods. If nausea and/or vomiting occur, drink only clear liquids until the nausea and/or vomiting subsides. Call your physician if vomiting continues.  Special Instructions/Symptoms: Your throat may feel dry or sore from the anesthesia or the breathing tube placed in your throat during surgery. If this causes discomfort, gargle with warm salt water. The  discomfort should disappear within 24 hours.  If you had a scopolamine patch placed behind your ear for the management of post- operative nausea and/or vomiting:  1. The medication in the patch is effective for 72 hours, after which it should be removed.  Wrap patch in a tissue and discard in the trash. Wash hands thoroughly with soap and water. 2. You may remove the patch earlier than 72 hours if you experience unpleasant side effects which may include dry mouth, dizziness or visual disturbances. 3. Avoid touching the patch. Wash your hands with soap and water after contact with the patch.

## 2020-06-15 NOTE — Progress Notes (Signed)
1 Day Post-Op Procedure(s) (LRB): ANTERIOR (CYSTOCELE) AND POSTERIOR REPAIR (RECTOCELE) (N/A) TRANSVAGINAL TAPE (TVT) PROCEDURE/EXACT MIDURETHRAL SLING (N/A) CYSTOSCOPY (N/A)  Subjective: Patient reports tolerating PO and no problems voiding.   Diminished bleeding overnight.   Objective: I have reviewed patient's vital signs, intake and output and labs. Vitals:   06/15/20 0224 06/15/20 0622  BP: 120/67 117/67  Pulse: 87 88  Resp: 16 16  Temp: 98.1 F (36.7 C) 98.7 F (37.1 C)  SpO2: 96% 96%   CBC    Component Value Date/Time   WBC 10.9 (H) 06/15/2020 0640   RBC 3.75 (L) 06/15/2020 0640   HGB 11.3 (L) 06/15/2020 0640   HCT 33.3 (L) 06/15/2020 0640   PLT 238 06/15/2020 0640   MCV 88.8 06/15/2020 0640   MCH 30.1 06/15/2020 0640   MCHC 33.9 06/15/2020 0640   RDW 12.5 06/15/2020 0640   LYMPHSABS 2,935 05/14/2020 1358   MONOABS 0.4 11/28/2019 1019   EOSABS 27 05/14/2020 1358   BASOSABS 40 05/14/2020 1358   I/O - 2430/4900 cc UO  General: alert and cooperative Resp: clear to auscultation bilaterally Cardio: regular rate and rhythm, S1, S2 normal, no murmur, click, rub or gallop GI: soft, non-tender; bowel sounds normal; no masses,  no organomegaly and incision: SP incisions with ecchymoses and fullness. Extremities: TED hose on.  Vaginal Bleeding: minimal  Vaginal packing removed and minimal clot noted and moderate staining of packing.  No active blood noted.   Assessment: s/p Procedure(s) with comments: ANTERIOR (CYSTOCELE) AND POSTERIOR REPAIR (RECTOCELE) (N/A) TRANSVAGINAL TAPE (TVT) PROCEDURE/EXACT MIDURETHRAL SLING (N/A) - EXACT MIDURETHRAL SLING CYSTOSCOPY (N/A): progressing well Suprapubic ecchymoses and hematoma.   Plan: Discharge home  Rx for Percocet.  Will have patient avoid Ibuprofen/oral NSAIDs. Bleeding and bruising precautions given.  Post op instructions reviewed.  Intraoperative findings and procedure reviewed with the patient.  Will make a  referral for urology follow up for the suspected bladder polyp.  FU in 1 week.    LOS: 0 days    Arloa Koh 06/15/2020, 7:48 AM

## 2020-06-21 ENCOUNTER — Other Ambulatory Visit: Payer: Self-pay

## 2020-06-21 ENCOUNTER — Encounter: Payer: Self-pay | Admitting: Obstetrics and Gynecology

## 2020-06-21 ENCOUNTER — Ambulatory Visit (INDEPENDENT_AMBULATORY_CARE_PROVIDER_SITE_OTHER): Payer: No Typology Code available for payment source | Admitting: Obstetrics and Gynecology

## 2020-06-21 VITALS — BP 100/74 | HR 100 | Ht 65.25 in | Wt 183.0 lb

## 2020-06-21 DIAGNOSIS — T148XXA Other injury of unspecified body region, initial encounter: Secondary | ICD-10-CM

## 2020-06-21 DIAGNOSIS — R5383 Other fatigue: Secondary | ICD-10-CM

## 2020-06-21 DIAGNOSIS — D414 Neoplasm of uncertain behavior of bladder: Secondary | ICD-10-CM

## 2020-06-21 DIAGNOSIS — Z9889 Other specified postprocedural states: Secondary | ICD-10-CM

## 2020-06-21 MED ORDER — METRONIDAZOLE 500 MG PO TABS: 500.0000 mg | ORAL_TABLET | Freq: Two times a day (BID) | ORAL | 0 refills | Status: DC

## 2020-06-21 NOTE — Progress Notes (Signed)
GYNECOLOGY  VISIT   HPI: 54 y.o.   Married  Caucasian  female   [phone] with No LMP recorded (lmp unknown). Patient has had a hysterectomy.   here for 1 week follow up ANTERIOR (CYSTOCELE) AND POSTERIOR REPAIR (RECTOCELE) (N/A Vagina ) TRANSVAGINAL TAPE (TVT) PROCEDURE/EXACT MIDURETHRAL SLING (N/A Vagina ) CYSTOSCOPY (N/A Bladder).  Patient feels like she is now seeing an increased amount of bleeding. Started increasing yesterday.  She is walking a little bit more.  She did an outing for about 10 minutes.  Stopped taking Percocet and is using only Tylenol which is managing her pain.  (Not taking Motrin.) She has period like cramping.  She has a right flank pain that is mild- moderate.  Last BM was this am.  Eating ok.  She felt really tired.   She had a gauze tampon packing placed in the PACU post op and she was monitored overnight.   Patient has hx of recent easy bruising and saw her PCP and hematology preop.  No etiology found.  Hgb 11.3 on 06/15/20.    GYNECOLOGIC HISTORY: No LMP recorded (lmp unknown). Patient has had a hysterectomy. Contraception:  Hyst--ovaries remain Menopausal hormone therapy:  none Last mammogram: 11-21-19 3D/Neg/density B/BiRads1 Last pap smear:  10-16-19 Neg:Neg HR HPV,2019 normal per patient        OB History    Gravida  5   Para  3   Term      Preterm      AB  2   Living  3     SAB  2   TAB      Ectopic      Multiple      Live Births                 Patient Active Problem List   Diagnosis Date Noted  . Status post surgery 06/14/2020  . Genetic testing 02/18/2020  . Family history of breast cancer   . High risk medication use 10/30/2019  . Left bundle branch block 04/28/2019  . Right wrist pain 03/13/2019  . Hamstring strain, left, initial encounter 02/20/2019  . 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 Primary osteoarthritis of both feet 10/09/2018  . Hx of migraines 09/27/2018  . History of gastroesophageal reflux  (GERD) 09/27/2018  . 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 Moderate persistent asthma 07/05/2018  . Hypothyroid 06/28/2018    Past Medical History:  Diagnosis Date  . Arthritis    ra zero negative  . Asthma    mild/moderate persistent asthma  . Complication of anesthesia    slow to awaken 25 yrs ago  . Elevated liver function tests dr Neil Crouch pcp manages   for last year  . Family history of breast cancer   . Fibroid   . Frequent headaches   . GERD (gastroesophageal reflux disease)   . Gluten intolerance   . H/O seasonal allergies   . History of chicken pox   . History of COVID-19 07/2019   high fever sob, x 7 days all symptoms resolved  . Left bundle branch block 02/2019   Dr.Paula Ross  . Migraines   . PONV (postoperative nausea and vomiting)    ponv with several surgeries, likes scopolamine patch  . Thyroid disease    Multiple benign nodules--thyroid removed    Past Surgical History:  Procedure Laterality Date  . ANTERIOR AND POSTERIOR REPAIR N/A 06/14/2020   Procedure: ANTERIOR (CYSTOCELE) AND POSTERIOR REPAIR (RECTOCELE);  Surgeon:  Nunzio Cobbs, MD;  Location[facility];  Service: Gynecology;  Laterality: N/A;  . BLADDER SUSPENSION N/A 06/14/2020   Procedure: TRANSVAGINAL TAPE (TVT) PROCEDURE/EXACT MIDURETHRAL SLING;  Surgeon: Nunzio Cobbs, MD;  Location: [facility];  Service: Gynecology;  Laterality: N/A;  EXACT MIDURETHRAL SLING  . CARDIAC CATHETERIZATION  2009   results normal done in [place]  . CHOLECYSTECTOMY  1995   laparoscopic  . CYSTOSCOPY N/A 06/14/2020   Procedure: CYSTOSCOPY;  Surgeon: Nunzio Cobbs, MD;  Location: [facility];  Service: Gynecology;  Laterality: N/A;  . HAMMER TOE SURGERY Bilateral 2009  . HERNIA REPAIR  2015   Has abdominal wall mesh - [place], [place].  Laparoscopic incisional hernia repari with 7 x 9 inch Ventralight ST with Echo  mesh  . LAPAROSCOPIC HYSTERECTOMY  2007   Supracervical hysterectomy with cystosctopy - [place], [place] partial  . THYROID SURGERY  2013   total thyroidectomy due to 11 nodules    Current Outpatient Medications  Medication Sig Dispense Refill  . acetaminophen (TYLENOL) 500 MG tablet Take 500 mg by mouth every 6 (six) hours as needed.    Marland Kitchen ADVAIR DISKUS 250-50 MCG/DOSE AEPB TAKE 1 PUFF BY MOUTH TWICE A DAY 180 each 3  . albuterol (VENTOLIN HFA) 108 (90 Base) MCG/ACT inhaler TAKE 2 PUFFS BY MOUTH EVERY 6 HOURS AS NEEDED FOR WHEEZE OR SHORTNESS OF BREATH 25.5 g 5  . cholecalciferol (VITAMIN D) 400 units TABS tablet Take 5,000 Units by mouth.     . cyclobenzaprine (FLEXERIL) 10 MG tablet Take 1 tablet (10 mg total) by mouth 3 (three) times daily as needed for muscle spasms. 30 tablet 0  . Diclofenac Sodium ([name]) 2 % SOLN Place 1 application onto the skin 2 (two) times daily. (Patient taking differently: Place 1 application onto the skin daily as needed. ) 112 g 2  . fexofenadine-pseudoephedrine (ALLEGRA-D 24) 180-240 MG 24 hr tablet One tablet as needed daily for allergies  (Patient taking differently: daily. ) 30 tablet 5  . gabapentin (NEURONTIN) 800 MG tablet Take 1 tablet (800 mg total) by mouth at bedtime. 90 tablet 1  . hydroxychloroquine (PLAQUENIL) 200 MG tablet TAKE 1 TABLET BY MOUTH TWICE A DAY 180 tablet 0  . ibuprofen (ADVIL) 800 MG tablet Take 1 tablet (800 mg total) by mouth every 8 (eight) hours as needed. 30 tablet 0  . levothyroxine (SYNTHROID) 150 MCG tablet TAKE 1 TABLET BY MOUTH EVERY OTHER DAY 45 tablet 3  . montelukast (SINGULAIR) 10 MG tablet TAKE 1 TABLET BY MOUTH EVERYDAY AT BEDTIME 90 tablet 3  . SYNTHROID 175 MCG tablet Take 175 mcg by mouth every other day.     . venlafaxine XR (EFFEXOR XR) 37.5 MG 24 hr capsule Take 1 capsule (37.5 mg total) by mouth daily. 30 capsule 2  . oxyCODONE-acetaminophen (PERCOCET) 5-325 MG tablet Take 1 tablet by mouth every 4 (four) hours  as needed for severe pain. (Patient not taking: Reported on 06/21/2020) 20 tablet 0  . rosuvastatin (CRESTOR) 5 MG tablet Take 0.5 tablets (2.5 mg total) by mouth daily. 90 tablet 3   No current facility-administered medications for this visit.     ALLERGIES: Orencia [abatacept], Shellfish allergy, Strawberry (diagnostic), Gluten meal, Lortab [hydrocodone-acetaminophen], and Morphine and related  Family History  Problem Relation Age of Onset  . Arthritis Mother   . Cancer Mother 71       breast  ca--dec age 76  . Miscarriages / [place] Mother   . Breast cancer Mother 48  . Diabetes Father   . Heart attack Father   . Heart disease Father        pacemaker   . Hyperlipidemia Father   . Hypertension Father   . Stroke Father        x4  . COPD Sister   . Peripheral Artery Disease Sister   . Asthma Brother   . Birth defects Brother   . Cancer Brother        skin  . Hyperlipidemia Brother   . Heart attack Maternal Grandmother   . Heart disease Maternal Grandmother   . Hyperlipidemia Maternal Grandmother   . Stroke Maternal Grandmother   . Alcohol abuse Paternal Grandmother   . Cancer Paternal Grandmother        colon  . Cancer Paternal Grandfather        breast  . Heart attack Paternal Grandfather   . Breast cancer Paternal Grandfather 66  . Alcohol abuse Brother   . Early death Brother        suicide  . Mental illness Brother   . Asthma Daughter   . Asthma Son   . Asthma Son   . Breast cancer Maternal Aunt 60  . Kidney cancer Paternal Aunt 85  . Breast cancer Paternal Aunt 11  . Throat cancer Paternal Uncle   . Thyroid cancer Paternal Uncle   . Breast cancer Cousin 56  . Prostate cancer Cousin     Social History   Socioeconomic History  . Marital status: Married    Spouse name: Not on file  . Number of children: Not on file  . Years of education: Not on file  . Highest education level: Not on file  Occupational History  . Not on file  Tobacco Use  .  Smoking status: Never Smoker  . Smokeless tobacco: Never Used  Vaping Use  . Vaping Use: Never used  Substance and Sexual Activity  . Alcohol use: Yes    Comment: occ  . Drug use: Never  . Sexual activity: Yes    Partners: Male    Birth control/protection: Surgical    Comment: Hyst-ovaries remain  Other Topics Concern  . Not on file  Social History Narrative  . Not on file   Social Determinants of Health   Financial Resource Strain:   . Difficulty of Paying Living Expenses: Not on file  Food Insecurity:   . Worried About Charity fundraiser in the Last Year: Not on file  . Ran Out of Food in the Last Year: Not on file  Transportation Needs:   . Lack of Transportation (Medical): Not on file  . Lack of Transportation (Non-Medical): Not on file  Physical Activity:   . Days of Exercise per Week: Not on file  . Minutes of Exercise per Session: Not on file  Stress:   . Feeling of Stress : Not on file  Social Connections:   . Frequency of Communication with Friends and Family: Not on file  . Frequency of Social Gatherings with Friends and Family: Not on file  . Attends Religious Services: Not on file  . Active Member of Clubs or Organizations: Not on file  . Attends Archivist Meetings: Not on file  . Marital Status: Not on file  Intimate Partner Violence:   . Fear of Current or Ex-Partner: Not on file  . Emotionally Abused: Not on  file  . Physically Abused: Not on file  . Sexually Abused: Not on file    Review of Systems  All other systems reviewed and are negative.   PHYSICAL EXAMINATION:    BP 100/74   Pulse 100   Ht 5' 5.25" (1.657 m)   Wt 183 lb (83 kg)   LMP  (LMP Unknown)   BMI 30.22 kg/m     General appearance: alert, cooperative and appears stated age  Pelvic: External genitalia:  SP incisions with small 1.5 cm hematomas.              Urethra:  normal appearing urethra with no masses, tenderness or lesions              Bartholins and Skenes:  normal                 Vagina: vaginal sutures intact.  Sling protected. Posterior vaginal wall hematoma.                Cervix: no lesions                Bimanual Exam:  Uterus: absent.              Adnexa: no mass, fullness, tenderness              Rectal exam: Yes.  .  Confirms rectovaginal hematoma.              Anus:  normal sphincter tone, no lesions  Chaperone was present for exam.  ASSESSMENT  Status post surgery.  Post op hematomas.  Incisions intact. Hx bruising.  Fatigue.  Papillary growth of bladder.   PLAN  We discussed the hematomas.   CBC now.  Flagyl 500 mg po bid x 7 days.  Call for heavy bleeding.  Stay off Rinvoq.  FU in 3 days.  See urology in about 1 month.  Return to hematology.

## 2020-06-22 ENCOUNTER — Other Ambulatory Visit: Payer: Self-pay | Admitting: Family Medicine

## 2020-06-22 LAB — CBC
Hematocrit: 37.7 % (ref 34.0–46.6)
Hemoglobin: 12.5 g/dL (ref 11.1–15.9)
MCH: 29.3 pg (ref 26.6–33.0)
MCHC: 33.2 g/dL (ref 31.5–35.7)
MCV: 88 fL (ref 79–97)
Platelets: 268 10*3/uL (ref 150–450)
RBC: 4.27 x10E6/uL (ref 3.77–5.28)
RDW: 12.1 % (ref 11.7–15.4)
WBC: 9.7 10*3/uL (ref 3.4–10.8)

## 2020-06-22 IMAGING — CT CT HEART MORP W/ CTA COR W/ SCORE W/ CA W/CM &/OR W/O CM
4 of 7 series · 8 of 20 positions shown, 9 images · IV contrast (APPLIED)
Comparison: None.
COMPARISON: None.

Addendum:
EXAM:
OVER-READ INTERPRETATION  CT CHEST

The following report is an over-read performed by radiologist Dr.
Jesus Carrasco [[email or web address]] on 05/22/2019. This
over-read does not include interpretation of cardiac or coronary
anatomy or pathology. The /coronary CTA interpretation by the
cardiologist is attached.
TECHNIQUE: The patient was scanned on a Phillips Force scanner.

[Series 6: best diast · axial · 0.39mm/px · z∈[+1132,+1178]mm · 2 of 346 slices shown, 3 images]
[im 116/346  vessel]
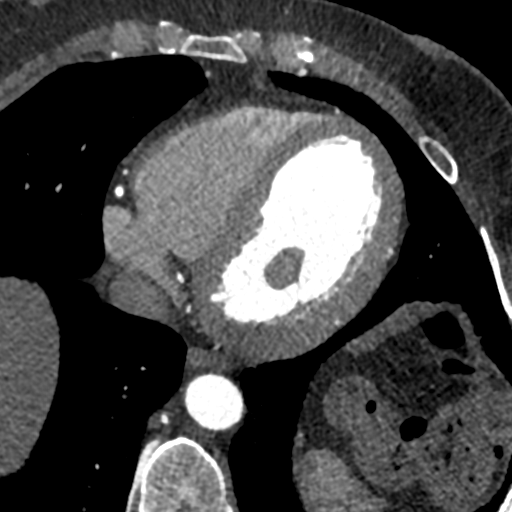
[im 116/346  lung]
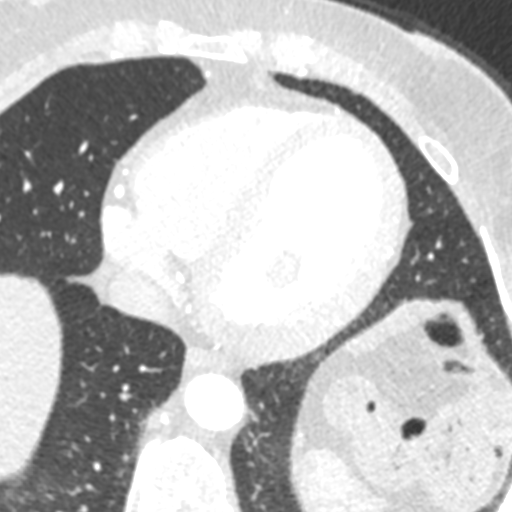
[im 231/346  vessel]
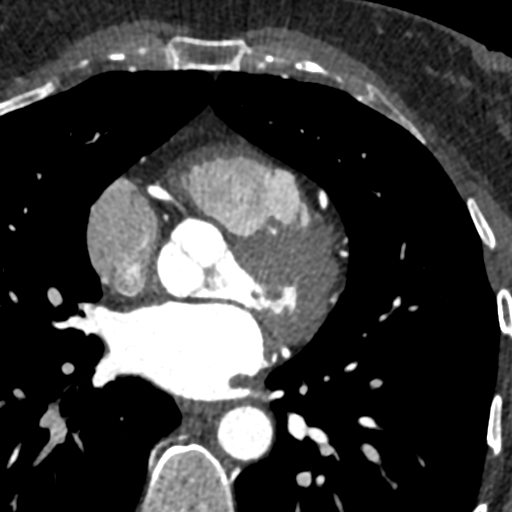

[Series 7: best syst · axial · 0.39mm/px · z∈[+1132,+1178]mm · 2 of 346 slices shown]
[im 116/346  vessel]
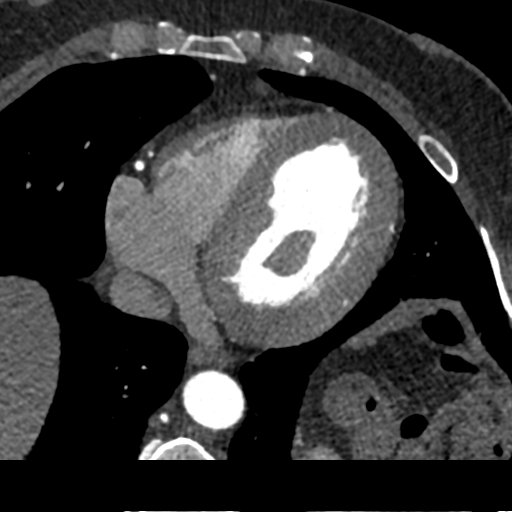
[im 231/346  vessel]
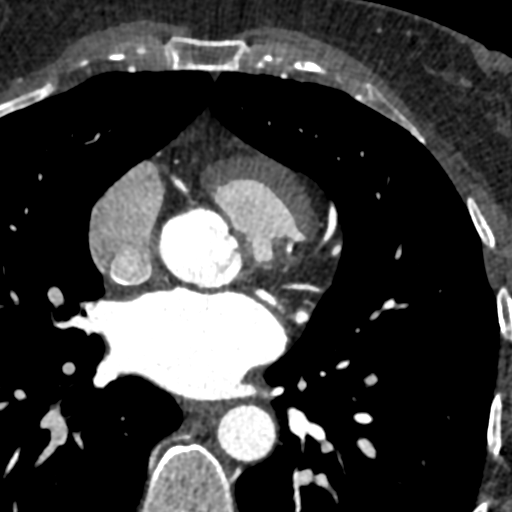

[Series 8: ts diast sharp 71 % · axial · 0.39mm/px · z∈[+1132,+1178]mm · 2 of 346 slices shown]
[im 116/346  lung]
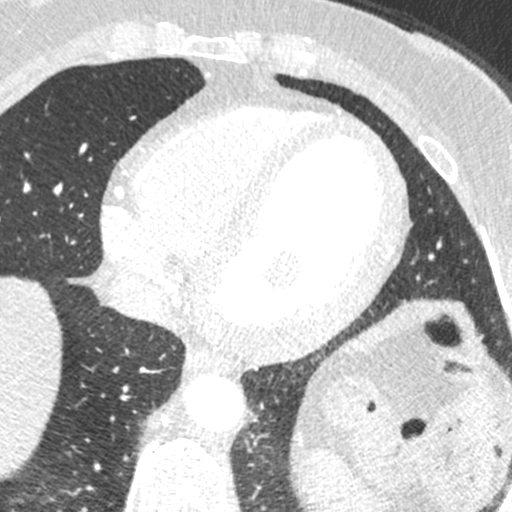
[im 231/346  lung]
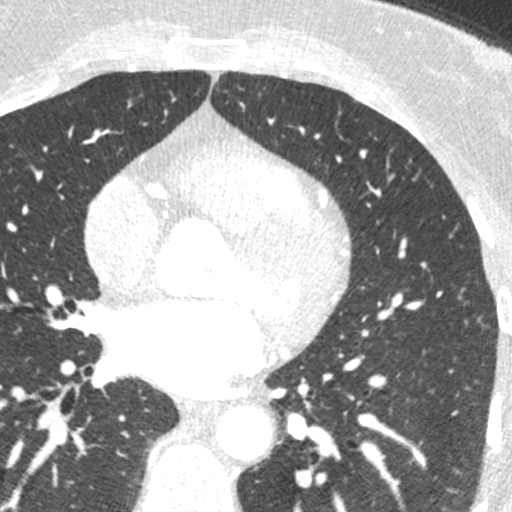

[Series 9: ts syst sharp · axial · 0.39mm/px · z∈[+1132,+1178]mm · 2 of 346 slices shown]
[im 116/346  lung]
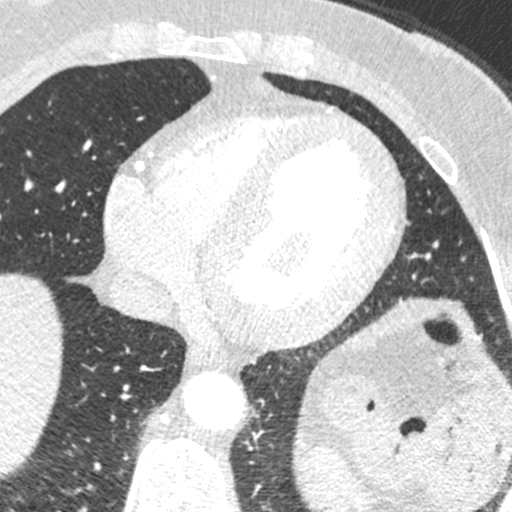
[im 231/346  lung]
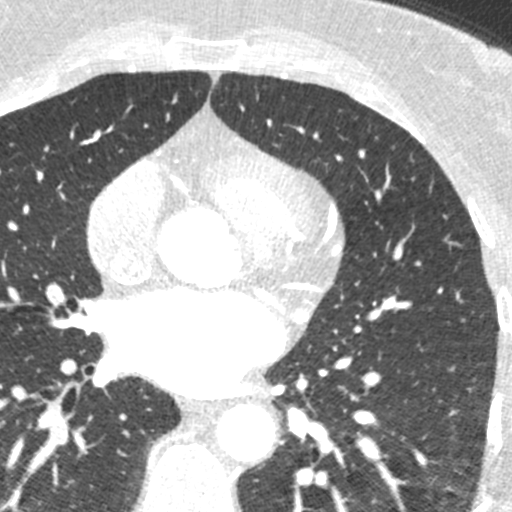

[8 of 20 positions shown; findings below may reference images not displayed]

FINDINGS: Limited view of the lung parenchyma demonstrates no suspicious
nodularity. Airways are normal.

Limited view of the mediastinum demonstrates no adenopathy.
Esophagus normal.

Limited view of the upper abdomen unremarkable.

Limited view of the skeleton and chest wall is unremarkable.
IMPRESSION: No significant extracardiac findings

EXAM:
Cardiac/Coronary  CT
FINDINGS: A 120 kV prospective scan was triggered in the descending thoracic
aorta at 111 HU's. Axial non-contrast 3 mm slices were carried out
through the heart. The data set was analyzed on a dedicated work
station and scored using the Agatson method. Gantry rotation speed
was 250 msecs and collimation was .6 mm. No beta blockade and 0.8 mg
of sl NTG was given. The 3D data set was reconstructed in 5%
intervals of the 67-82 % of the R-R cycle. Diastolic phases were
analyzed on a dedicated work station using MPR, MIP and VRT modes.
The patient received 80 cc of contrast.

Aorta:  Normal size.  No calcifications.  No dissection.

Aortic Valve:  Trileaflet.  No calcifications.

Coronary Arteries:  Normal coronary origin.  Right dominance.

RCA is a large dominant artery that gives rise to PDA and PLVB.
There is no plaque.

Left main is a large artery that gives rise to LAD, Ramus and LCX
arteries. There is no plaque.

LAD is a large vessel that gives rise to a large 1st diagonal with a
large superior sub branch and a very small inferior sub branch. The
ongoing LAD gives rise to a small 2nd diagonal. There is minimal
calcified plaque in the ostial LAD with associated stenosis of 0 to
24% stenosis.

Ramus is a small to moderate sized vessel that has no plaque.

LCX is a non-dominant artery that gives rise to one large OM1
branch. There is no plaque.

Other findings:

Normal pulmonary vein drainage into the left atrium.

Normal let atrial appendage without a thrombus.

Normal size of the pulmonary artery.
IMPRESSION: 1. Coronary calcium score of 3. This was 84th percentile for age and
sex matched control.

2.  Normal coronary origin with right dominance.

3. Minimal atherosclerotic plaque in the ostial LAD with associated
stenosis of 0-24%. CAD-RADS 1.

4.  Recommend aggressive risk factor modification.

Holger Mario Bahua

[place]

*** End of Addendum ***
EXAM:
OVER-READ INTERPRETATION  CT CHEST

The following report is an over-read performed by radiologist Dr.
Jesus Carrasco [REDACTED] on 05/22/2019. This
over-read does not include interpretation of cardiac or coronary
anatomy or pathology. The /coronary CTA interpretation by the
cardiologist is attached.
FINDINGS: Limited view of the lung parenchyma demonstrates no suspicious
nodularity. Airways are normal.

Limited view of the mediastinum demonstrates no adenopathy.
Esophagus normal.

Limited view of the upper abdomen unremarkable.

Limited view of the skeleton and chest wall is unremarkable.
IMPRESSION: No significant extracardiac findings

## 2020-06-24 ENCOUNTER — Other Ambulatory Visit: Payer: Self-pay

## 2020-06-24 ENCOUNTER — Ambulatory Visit: Payer: No Typology Code available for payment source | Admitting: Obstetrics and Gynecology

## 2020-06-24 ENCOUNTER — Encounter: Payer: Self-pay | Admitting: Obstetrics and Gynecology

## 2020-06-24 ENCOUNTER — Other Ambulatory Visit: Payer: Self-pay | Admitting: Family Medicine

## 2020-06-24 VITALS — BP 104/72 | HR 84 | Ht 65.25 in | Wt 183.0 lb

## 2020-06-24 DIAGNOSIS — N898 Other specified noninflammatory disorders of vagina: Secondary | ICD-10-CM

## 2020-06-24 NOTE — Progress Notes (Signed)
GYNECOLOGY  VISIT   HPI: 54 y.o.   Married  Caucasian  female   [phone] with No LMP recorded (lmp unknown). Patient has had a hysterectomy.   here for 10 day follow up ANTERIOR (CYSTOCELE) AND POSTERIOR REPAIR (RECTOCELE) (N/A Vagina ) TRANSVAGINAL TAPE (TVT) PROCEDURE/EXACT MIDURETHRAL SLING (N/A Vagina ) CYSTOSCOPY (N/A Bladder).  States the bleeding is much less.   She is taking Flagyl.  No fevers.   She is having arthritis pain since she is off her Rinvoq.   Bladder is functioning well.  Sneezed and had no urinary leakage.    GYNECOLOGIC HISTORY: No LMP recorded (lmp unknown). Patient has had a hysterectomy. Contraception: Hyst--ovaries remain Menopausal hormone therapy:  none Last mammogram:  11-21-19 3D/Neg/density B/BiRads1 Last pap smear: 10-16-19 Neg:Neg HR HPV,2019 normal per patient        OB History    Gravida  5   Para  3   Term      Preterm      AB  2   Living  3     SAB  2   TAB      Ectopic      Multiple      Live Births                 Patient Active Problem List   Diagnosis Date Noted  . Status post surgery 06/14/2020  . Genetic testing 02/18/2020  . Family history of breast cancer   . High risk medication use 10/30/2019  . Left bundle branch block 04/28/2019  . Right wrist pain 03/13/2019  . Hamstring strain, left, initial encounter 02/20/2019  . 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 Primary osteoarthritis of both feet 10/09/2018  . Hx of migraines 09/27/2018  . History of gastroesophageal reflux (GERD) 09/27/2018  . 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 Moderate persistent asthma 07/05/2018  . Hypothyroid 06/28/2018    Past Medical History:  Diagnosis Date  . Arthritis    ra zero negative  . Asthma    mild/moderate persistent asthma  . Complication of anesthesia    slow to awaken 25 yrs ago  . Elevated liver function tests dr Neil Crouch pcp manages   for last year  . Family history of breast  cancer   . Fibroid   . Frequent headaches   . GERD (gastroesophageal reflux disease)   . Gluten intolerance   . H/O seasonal allergies   . History of chicken pox   . History of COVID-19 07/2019   high fever sob, x 7 days all symptoms resolved  . Left bundle branch block 02/2019   Dr.Paula Ross  . Migraines   . PONV (postoperative nausea and vomiting)    ponv with several surgeries, likes scopolamine patch  . Thyroid disease    Multiple benign nodules--thyroid removed    Past Surgical History:  Procedure Laterality Date  . ANTERIOR AND POSTERIOR REPAIR N/A 06/14/2020   Procedure: ANTERIOR (CYSTOCELE) AND POSTERIOR REPAIR (RECTOCELE);  Surgeon: Nunzio Cobbs, MD;  Location: [facility];  Service: Gynecology;  Laterality: N/A;  . BLADDER SUSPENSION N/A 06/14/2020   Procedure: TRANSVAGINAL TAPE (TVT) PROCEDURE/EXACT MIDURETHRAL SLING;  Surgeon: Nunzio Cobbs, MD;  Location: [facility];  Service: Gynecology;  Laterality: N/A;  EXACT MIDURETHRAL SLING  . CARDIAC CATHETERIZATION  2009   results normal done in [place]  . CHOLECYSTECTOMY  1995   laparoscopic  . CYSTOSCOPY N/A 06/14/2020  Procedure: CYSTOSCOPY;  Surgeon: Nunzio Cobbs, MD;  Location: [facility];  Service: Gynecology;  Laterality: N/A;  . HAMMER TOE SURGERY Bilateral 2009  . HERNIA REPAIR  2015   Has abdominal wall mesh - [place], [place].  Laparoscopic incisional hernia repari with 7 x 9 inch Ventralight ST with Echo mesh  . LAPAROSCOPIC HYSTERECTOMY  2007   Supracervical hysterectomy with cystosctopy - [place], [place] partial  . THYROID SURGERY  2013   total thyroidectomy due to 11 nodules    Current Outpatient Medications  Medication Sig Dispense Refill  . acetaminophen (TYLENOL) 500 MG tablet Take 500 mg by mouth every 6 (six) hours as needed.    Marland Kitchen ADVAIR DISKUS 250-50 MCG/DOSE AEPB INHALE 1 PUFF BY MOUTH TWICE A DAY 180 each 3  .  albuterol (VENTOLIN HFA) 108 (90 Base) MCG/ACT inhaler TAKE 2 PUFFS BY MOUTH EVERY 6 HOURS AS NEEDED FOR WHEEZE OR SHORTNESS OF BREATH 25.5 g 5  . cholecalciferol (VITAMIN D) 400 units TABS tablet Take 5,000 Units by mouth.     . cyclobenzaprine (FLEXERIL) 10 MG tablet Take 1 tablet (10 mg total) by mouth 3 (three) times daily as needed for muscle spasms. 30 tablet 0  . Diclofenac Sodium ([name]) 2 % SOLN Place 1 application onto the skin 2 (two) times daily. (Patient taking differently: Place 1 application onto the skin daily as needed. ) 112 g 2  . fexofenadine-pseudoephedrine (ALLEGRA-D 24) 180-240 MG 24 hr tablet One tablet as needed daily for allergies  (Patient taking differently: daily. ) 30 tablet 5  . gabapentin (NEURONTIN) 800 MG tablet Take 1 tablet (800 mg total) by mouth at bedtime. 90 tablet 1  . hydroxychloroquine (PLAQUENIL) 200 MG tablet TAKE 1 TABLET BY MOUTH TWICE A DAY 180 tablet 0  . ibuprofen (ADVIL) 800 MG tablet Take 1 tablet (800 mg total) by mouth every 8 (eight) hours as needed. 30 tablet 0  . levothyroxine (SYNTHROID) 150 MCG tablet TAKE 1 TABLET BY MOUTH EVERY OTHER DAY 45 tablet 3  . metroNIDAZOLE (FLAGYL) 500 MG tablet Take 1 tablet (500 mg total) by mouth 2 (two) times daily. 14 tablet 0  . montelukast (SINGULAIR) 10 MG tablet TAKE 1 TABLET BY MOUTH EVERYDAY AT BEDTIME 90 tablet 3  . oxyCODONE-acetaminophen (PERCOCET) 5-325 MG tablet Take 1 tablet by mouth every 4 (four) hours as needed for severe pain. 20 tablet 0  . SYNTHROID 175 MCG tablet Take 175 mcg by mouth every other day.     . venlafaxine XR (EFFEXOR XR) 37.5 MG 24 hr capsule Take 1 capsule (37.5 mg total) by mouth daily. 30 capsule 2  . rosuvastatin (CRESTOR) 5 MG tablet Take 0.5 tablets (2.5 mg total) by mouth daily. 90 tablet 3   No current facility-administered medications for this visit.     ALLERGIES: Orencia [abatacept], Shellfish allergy, Strawberry (diagnostic), Gluten meal, Lortab  [hydrocodone-acetaminophen], and Morphine and related  Family History  Problem Relation Age of Onset  . Arthritis Mother   . Cancer Mother 86       breast ca--dec age 10  . Miscarriages / [place] Mother   . Breast cancer Mother 54  . Diabetes Father   . Heart attack Father   . Heart disease Father        pacemaker   . Hyperlipidemia Father   . Hypertension Father   . Stroke Father        x4  . COPD Sister   .  Peripheral Artery Disease Sister   . Asthma Brother   . Birth defects Brother   . Cancer Brother        skin  . Hyperlipidemia Brother   . Heart attack Maternal Grandmother   . Heart disease Maternal Grandmother   . Hyperlipidemia Maternal Grandmother   . Stroke Maternal Grandmother   . Alcohol abuse Paternal Grandmother   . Cancer Paternal Grandmother        colon  . Cancer Paternal Grandfather        breast  . Heart attack Paternal Grandfather   . Breast cancer Paternal Grandfather 35  . Alcohol abuse Brother   . Early death Brother        suicide  . Mental illness Brother   . Asthma Daughter   . Asthma Son   . Asthma Son   . Breast cancer Maternal Aunt 60  . Kidney cancer Paternal Aunt 85  . Breast cancer Paternal Aunt 3  . Throat cancer Paternal Uncle   . Thyroid cancer Paternal Uncle   . Breast cancer Cousin 38  . Prostate cancer Cousin     Social History   Socioeconomic History  . Marital status: Married    Spouse name: Not on file  . Number of children: Not on file  . Years of education: Not on file  . Highest education level: Not on file  Occupational History  . Not on file  Tobacco Use  . Smoking status: Never Smoker  . Smokeless tobacco: Never Used  Vaping Use  . Vaping Use: Never used  Substance and Sexual Activity  . Alcohol use: Yes    Comment: occ  . Drug use: Never  . Sexual activity: Yes    Partners: Male    Birth control/protection: Surgical    Comment: Hyst-ovaries remain  Other Topics Concern  . Not on file   Social History Narrative  . Not on file   Social Determinants of Health   Financial Resource Strain:   . Difficulty of Paying Living Expenses: Not on file  Food Insecurity:   . Worried About Charity fundraiser in the Last Year: Not on file  . Ran Out of Food in the Last Year: Not on file  Transportation Needs:   . Lack of Transportation (Medical): Not on file  . Lack of Transportation (Non-Medical): Not on file  Physical Activity:   . Days of Exercise per Week: Not on file  . Minutes of Exercise per Session: Not on file  Stress:   . Feeling of Stress : Not on file  Social Connections:   . Frequency of Communication with Friends and Family: Not on file  . Frequency of Social Gatherings with Friends and Family: Not on file  . Attends Religious Services: Not on file  . Active Member of Clubs or Organizations: Not on file  . Attends Archivist Meetings: Not on file  . Marital Status: Not on file  Intimate Partner Violence:   . Fear of Current or Ex-Partner: Not on file  . Emotionally Abused: Not on file  . Physically Abused: Not on file  . Sexually Abused: Not on file    Review of Systems  All other systems reviewed and are negative.   PHYSICAL EXAMINATION:    BP 104/72 (Cuff Size: Large)   Pulse 84   Ht 5' 5.25" (1.657 m)   Wt 183 lb (83 kg)   LMP  (LMP Unknown)   BMI 30.22 kg/m  General appearance: alert, cooperative and appears stated age    Pelvic: External genitalia: SP incisions intact.                Urethra:  normal appearing urethra with no masses, tenderness or lesions              Bartholins and Skenes: normal                 Vagina:  Ecchymoses of posterior vaginal mucosa.  Two 3 - 4 mm openings of the posterior mild left vaginal mucosa, which became one opening with probing with a Q-Tip.  Dark clot noted.  Area cleansed with Hibiclens.  Palpable clot of the posterior vaginal wall on left.  Sutures are intact and sling is protected.                Cervix: no lesions                Bimanual Exam:  Uterus:              Adnexa: no mass, fullness, tenderness              Rectal exam: Yes.  .  Confirms.              Anus:  normal sphincter tone, no lesions  Chaperone was present for exam.  ASSESSMENT  Vaginal wall hematoma of the rectovaginal space.  Draining.  On Flagyl.  PLAN  Finish Flagyl Rx.  Continue decreased activity.  Expect continued bleeding.  Call for fever or bright red bleeding.  Fu in 4 days.

## 2020-06-28 ENCOUNTER — Other Ambulatory Visit: Payer: Self-pay

## 2020-06-28 ENCOUNTER — Ambulatory Visit: Payer: No Typology Code available for payment source | Admitting: Obstetrics and Gynecology

## 2020-06-28 ENCOUNTER — Encounter: Payer: Self-pay | Admitting: Obstetrics and Gynecology

## 2020-06-28 VITALS — BP 112/60 | HR 80 | Ht 65.25 in | Wt 186.8 lb

## 2020-06-28 DIAGNOSIS — Z9889 Other specified postprocedural states: Secondary | ICD-10-CM

## 2020-06-28 DIAGNOSIS — R233 Spontaneous ecchymoses: Secondary | ICD-10-CM

## 2020-06-28 DIAGNOSIS — D699 Hemorrhagic condition, unspecified: Secondary | ICD-10-CM

## 2020-06-28 NOTE — Progress Notes (Signed)
GYNECOLOGY  VISIT   HPI: 54 y.o.   Married  Caucasian  female   [phone] with No LMP recorded (lmp unknown). Patient has had a hysterectomy.   here for 2 week follow up ANTERIOR (CYSTOCELE) AND POSTERIOR REPAIR (RECTOCELE) (N/A Vagina ) TRANSVAGINAL TAPE (TVT) PROCEDURE/EXACT MIDURETHRAL SLING (N/A Vagina ) CYSTOSCOPY (N/A Bladder).  Bleeding is dark blood and not very much.  No pain or discomfort.  No fever.   Finished Flagyl course.   Taking Effexor and has decreased desire for sexual activity.  It is helping to treat hot flashes.  Is also on Gabapentin.     GYNECOLOGIC HISTORY: No LMP recorded (lmp unknown). Patient has had a hysterectomy. Contraception: Hyst--ovaries remain Menopausal hormone therapy:  none Last mammogram: 11-21-19 3D/Neg/density B/BiRads1 Last pap smear: 10-16-19 Neg:Neg HR HPV,2019 normal per patient        OB History    Gravida  5   Para  3   Term      Preterm      AB  2   Living  3     SAB  2   TAB      Ectopic      Multiple      Live Births                 Patient Active Problem List   Diagnosis Date Noted  . Status post surgery 06/14/2020  . Genetic testing 02/18/2020  . Family history of breast cancer   . High risk medication use 10/30/2019  . Left bundle branch block 04/28/2019  . Right wrist pain 03/13/2019  . Hamstring strain, left, initial encounter 02/20/2019  . 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 Primary osteoarthritis of both feet 10/09/2018  . Hx of migraines 09/27/2018  . History of gastroesophageal reflux (GERD) 09/27/2018  . 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 Moderate persistent asthma 07/05/2018  . Hypothyroid 06/28/2018    Past Medical History:  Diagnosis Date  . Arthritis    ra zero negative  . Asthma    mild/moderate persistent asthma  . Complication of anesthesia    slow to awaken 25 yrs ago  . Elevated liver function tests dr Neil Crouch pcp manages   for last year   . Family history of breast cancer   . Fibroid   . Frequent headaches   . GERD (gastroesophageal reflux disease)   . Gluten intolerance   . H/O seasonal allergies   . History of chicken pox   . History of COVID-19 07/2019   high fever sob, x 7 days all symptoms resolved  . Left bundle branch block 02/2019   Dr.Paula Ross  . Migraines   . PONV (postoperative nausea and vomiting)    ponv with several surgeries, likes scopolamine patch  . Thyroid disease    Multiple benign nodules--thyroid removed    Past Surgical History:  Procedure Laterality Date  . ANTERIOR AND POSTERIOR REPAIR N/A 06/14/2020   Procedure: ANTERIOR (CYSTOCELE) AND POSTERIOR REPAIR (RECTOCELE);  Surgeon: Nunzio Cobbs, MD;  Location: [facility];  Service: Gynecology;  Laterality: N/A;  . BLADDER SUSPENSION N/A 06/14/2020   Procedure: TRANSVAGINAL TAPE (TVT) PROCEDURE/EXACT MIDURETHRAL SLING;  Surgeon: Nunzio Cobbs, MD;  Location: [facility];  Service: Gynecology;  Laterality: N/A;  EXACT MIDURETHRAL SLING  . CARDIAC CATHETERIZATION  2009   results normal done in [place]  . CHOLECYSTECTOMY  1995   laparoscopic  .  CYSTOSCOPY N/A 06/14/2020   Procedure: CYSTOSCOPY;  Surgeon: Nunzio Cobbs, MD;  Location: [facility];  Service: Gynecology;  Laterality: N/A;  . HAMMER TOE SURGERY Bilateral 2009  . HERNIA REPAIR  2015   Has abdominal wall mesh - [place], [place].  Laparoscopic incisional hernia repari with 7 x 9 inch Ventralight ST with Echo mesh  . LAPAROSCOPIC HYSTERECTOMY  2007   Supracervical hysterectomy with cystosctopy - [place], [place] partial  . THYROID SURGERY  2013   total thyroidectomy due to 11 nodules    Current Outpatient Medications  Medication Sig Dispense Refill  . acetaminophen (TYLENOL) 500 MG tablet Take 500 mg by mouth every 6 (six) hours as needed.    Marland Kitchen ADVAIR DISKUS 250-50 MCG/DOSE AEPB INHALE 1 PUFF BY MOUTH TWICE  A DAY 180 each 3  . albuterol (VENTOLIN HFA) 108 (90 Base) MCG/ACT inhaler TAKE 2 PUFFS BY MOUTH EVERY 6 HOURS AS NEEDED FOR WHEEZE OR SHORTNESS OF BREATH 25.5 g 5  . cholecalciferol (VITAMIN D) 400 units TABS tablet Take 5,000 Units by mouth.     . cyclobenzaprine (FLEXERIL) 10 MG tablet Take 1 tablet (10 mg total) by mouth 3 (three) times daily as needed for muscle spasms. 30 tablet 0  . Diclofenac Sodium ([name]) 2 % SOLN Place 1 application onto the skin 2 (two) times daily. (Patient taking differently: Place 1 application onto the skin daily as needed. ) 112 g 2  . fexofenadine-pseudoephedrine (ALLEGRA-D 24) 180-240 MG 24 hr tablet One tablet as needed daily for allergies  (Patient taking differently: daily. ) 30 tablet 5  . gabapentin (NEURONTIN) 800 MG tablet Take 1 tablet (800 mg total) by mouth at bedtime. 90 tablet 1  . hydroxychloroquine (PLAQUENIL) 200 MG tablet TAKE 1 TABLET BY MOUTH TWICE A DAY 180 tablet 0  . ibuprofen (ADVIL) 800 MG tablet Take 1 tablet (800 mg total) by mouth every 8 (eight) hours as needed. 30 tablet 0  . levothyroxine (SYNTHROID) 150 MCG tablet TAKE 1 TABLET BY MOUTH EVERY OTHER DAY 45 tablet 3  . montelukast (SINGULAIR) 10 MG tablet TAKE 1 TABLET BY MOUTH EVERYDAY AT BEDTIME 90 tablet 3  . SYNTHROID 175 MCG tablet Take 175 mcg by mouth every other day.     . venlafaxine XR (EFFEXOR XR) 37.5 MG 24 hr capsule Take 1 capsule (37.5 mg total) by mouth daily. 30 capsule 2  . rosuvastatin (CRESTOR) 5 MG tablet Take 0.5 tablets (2.5 mg total) by mouth daily. 90 tablet 3   No current facility-administered medications for this visit.     ALLERGIES: Orencia [abatacept], Shellfish allergy, Strawberry (diagnostic), Gluten meal, Lortab [hydrocodone-acetaminophen], and Morphine and related  Family History  Problem Relation Age of Onset  . Arthritis Mother   . Cancer Mother 14       breast ca--dec age 68  . Miscarriages / [place] Mother   . Breast cancer Mother 70   . Diabetes Father   . Heart attack Father   . Heart disease Father        pacemaker   . Hyperlipidemia Father   . Hypertension Father   . Stroke Father        x4  . COPD Sister   . Peripheral Artery Disease Sister   . Asthma Brother   . Birth defects Brother   . Cancer Brother        skin  . Hyperlipidemia Brother   . Heart attack Maternal Grandmother   .  Heart disease Maternal Grandmother   . Hyperlipidemia Maternal Grandmother   . Stroke Maternal Grandmother   . Alcohol abuse Paternal Grandmother   . Cancer Paternal Grandmother        colon  . Cancer Paternal Grandfather        breast  . Heart attack Paternal Grandfather   . Breast cancer Paternal Grandfather 29  . Alcohol abuse Brother   . Early death Brother        suicide  . Mental illness Brother   . Asthma Daughter   . Asthma Son   . Asthma Son   . Breast cancer Maternal Aunt 60  . Kidney cancer Paternal Aunt 85  . Breast cancer Paternal Aunt 21  . Throat cancer Paternal Uncle   . Thyroid cancer Paternal Uncle   . Breast cancer Cousin 58  . Prostate cancer Cousin     Social History   Socioeconomic History  . Marital status: Married    Spouse name: Not on file  . Number of children: Not on file  . Years of education: Not on file  . Highest education level: Not on file  Occupational History  . Not on file  Tobacco Use  . Smoking status: Never Smoker  . Smokeless tobacco: Never Used  Vaping Use  . Vaping Use: Never used  Substance and Sexual Activity  . Alcohol use: Yes    Comment: occ  . Drug use: Never  . Sexual activity: Yes    Partners: Male    Birth control/protection: Surgical    Comment: Hyst-ovaries remain  Other Topics Concern  . Not on file  Social History Narrative  . Not on file   Social Determinants of Health   Financial Resource Strain:   . Difficulty of Paying Living Expenses: Not on file  Food Insecurity:   . Worried About Charity fundraiser in the Last Year: Not on  file  . Ran Out of Food in the Last Year: Not on file  Transportation Needs:   . Lack of Transportation (Medical): Not on file  . Lack of Transportation (Non-Medical): Not on file  Physical Activity:   . Days of Exercise per Week: Not on file  . Minutes of Exercise per Session: Not on file  Stress:   . Feeling of Stress : Not on file  Social Connections:   . Frequency of Communication with Friends and Family: Not on file  . Frequency of Social Gatherings with Friends and Family: Not on file  . Attends Religious Services: Not on file  . Active Member of Clubs or Organizations: Not on file  . Attends Archivist Meetings: Not on file  . Marital Status: Not on file  Intimate Partner Violence:   . Fear of Current or Ex-Partner: Not on file  . Emotionally Abused: Not on file  . Physically Abused: Not on file  . Sexually Abused: Not on file    Review of Systems  All other systems reviewed and are negative.   PHYSICAL EXAMINATION:    BP 112/60 (Cuff Size: Large)   Pulse 80   Ht 5' 5.25" (1.657 m)   Wt 186 lb 12.8 oz (84.7 kg)   LMP  (LMP Unknown)   BMI 30.85 kg/m     General appearance: alert, cooperative and appears stated age   Pelvic: External genitalia:  SP incisions intact with small hematomas under each.              Urethra:  normal appearing urethra with no masses, tenderness or lesions              Bartholins and Skenes: normal                 Vagina: hematoma of the posterior vaginal wall and slightly cloudy drainage from small opening of the left vaginal mucosa.  Sling protected.  Suture lines intact.               Cervix: no lesions                Bimanual Exam:  Uterus:  absent              Adnexa: no mass, fullness, tenderness        Chaperone was present for exam.  ASSESSMENT  Status post surgery.   Vaginal wall hematoma.  Easy bruising.    PLAN  Continue decreased activity.  Call for foul odor or fever. FU in 1 week.  I did discussed  possible drainage of hematoma in the OR.  Will continue conservative management at this tim.  Referred back to hematology.

## 2020-06-30 NOTE — Progress Notes (Signed)
GYNECOLOGY  VISIT   HPI: 54 y.o.   Married  Caucasian  female   [phone] with No LMP recorded (lmp unknown). Patient has had a hysterectomy.   here for 3 week follow up ANTERIOR (CYSTOCELE) AND POSTERIOR REPAIR (RECTOCELE) (N/A Vagina ) TRANSVAGINAL TAPE (TVT) PROCEDURE/EXACT MIDURETHRAL SLING (N/A Vagina ) CYSTOSCOPY (N/A Bladder).  Has steady drainage from the vagina but not a lot.  It looks orange in color. Some odor.  No fever, shakes, chills.  No pain.   Did a course of Flagyl and did have GI upset.   Feels like she is retaining a lot of fluid.  She is not on her Rinvoq.   Voiding well. No leakage of urine.   GYNECOLOGIC HISTORY: No LMP recorded (lmp unknown). Patient has had a hysterectomy. Contraception:  Hyst--ovaries remain Menopausal hormone therapy:  none Last mammogram: 11-21-19 3D/Neg/density B/BiRads1 Last pap smear: 10-16-19 Neg:Neg HR HPV,2019 normal per patient                OB History    Gravida  5   Para  3   Term      Preterm      AB  2   Living  3     SAB  2   TAB      Ectopic      Multiple      Live Births                 Patient Active Problem List   Diagnosis Date Noted  . Status post surgery 06/14/2020  . Genetic testing 02/18/2020  . Family history of breast cancer   . High risk medication use 10/30/2019  . Left bundle branch block 04/28/2019  . Right wrist pain 03/13/2019  . Hamstring strain, left, initial encounter 02/20/2019  . 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 Primary osteoarthritis of both feet 10/09/2018  . Hx of migraines 09/27/2018  . History of gastroesophageal reflux (GERD) 09/27/2018  . 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 Moderate persistent asthma 07/05/2018  . Hypothyroid 06/28/2018    Past Medical History:  Diagnosis Date  . Arthritis    ra zero negative  . Asthma    mild/moderate persistent asthma  . Complication of anesthesia    slow to awaken 25 yrs ago  . Elevated  liver function tests dr Neil Crouch pcp manages   for last year  . Family history of breast cancer   . Fibroid   . Frequent headaches   . GERD (gastroesophageal reflux disease)   . Gluten intolerance   . H/O seasonal allergies   . History of chicken pox   . History of COVID-19 07/2019   high fever sob, x 7 days all symptoms resolved  . Left bundle branch block 02/2019   Dr.Paula Ross  . Migraines   . PONV (postoperative nausea and vomiting)    ponv with several surgeries, likes scopolamine patch  . Thyroid disease    Multiple benign nodules--thyroid removed    Past Surgical History:  Procedure Laterality Date  . ANTERIOR AND POSTERIOR REPAIR N/A 06/14/2020   Procedure: ANTERIOR (CYSTOCELE) AND POSTERIOR REPAIR (RECTOCELE);  Surgeon: Nunzio Cobbs, MD;  Location: [facility];  Service: Gynecology;  Laterality: N/A;  . BLADDER SUSPENSION N/A 06/14/2020   Procedure: TRANSVAGINAL TAPE (TVT) PROCEDURE/EXACT MIDURETHRAL SLING;  Surgeon: Nunzio Cobbs, MD;  Location: [facility];  Service: Gynecology;  Laterality: N/A;  EXACT MIDURETHRAL SLING  . CARDIAC CATHETERIZATION  2009   results normal done in [place]  . CHOLECYSTECTOMY  1995   laparoscopic  . CYSTOSCOPY N/A 06/14/2020   Procedure: CYSTOSCOPY;  Surgeon: Nunzio Cobbs, MD;  Location: [facility];  Service: Gynecology;  Laterality: N/A;  . HAMMER TOE SURGERY Bilateral 2009  . HERNIA REPAIR  2015   Has abdominal wall mesh - [place], [place].  Laparoscopic incisional hernia repari with 7 x 9 inch Ventralight ST with Echo mesh  . LAPAROSCOPIC HYSTERECTOMY  2007   Supracervical hysterectomy with cystosctopy - [place], [place] partial  . THYROID SURGERY  2013   total thyroidectomy due to 11 nodules    Current Outpatient Medications  Medication Sig Dispense Refill  . acetaminophen (TYLENOL) 500 MG tablet Take 500 mg by mouth every 6 (six) hours as needed.     Marland Kitchen ADVAIR DISKUS 250-50 MCG/DOSE AEPB INHALE 1 PUFF BY MOUTH TWICE A DAY 180 each 3  . albuterol (VENTOLIN HFA) 108 (90 Base) MCG/ACT inhaler TAKE 2 PUFFS BY MOUTH EVERY 6 HOURS AS NEEDED FOR WHEEZE OR SHORTNESS OF BREATH 25.5 g 5  . cholecalciferol (VITAMIN D) 400 units TABS tablet Take 5,000 Units by mouth.     . cyclobenzaprine (FLEXERIL) 10 MG tablet Take 1 tablet (10 mg total) by mouth 3 (three) times daily as needed for muscle spasms. 30 tablet 0  . Diclofenac Sodium ([name]) 2 % SOLN Place 1 application onto the skin 2 (two) times daily. (Patient taking differently: Place 1 application onto the skin daily as needed. ) 112 g 2  . fexofenadine-pseudoephedrine (ALLEGRA-D 24) 180-240 MG 24 hr tablet One tablet as needed daily for allergies  (Patient taking differently: daily. ) 30 tablet 5  . gabapentin (NEURONTIN) 800 MG tablet Take 1 tablet (800 mg total) by mouth at bedtime. 90 tablet 1  . hydroxychloroquine (PLAQUENIL) 200 MG tablet TAKE 1 TABLET BY MOUTH TWICE A DAY 180 tablet 0  . ibuprofen (ADVIL) 800 MG tablet Take 1 tablet (800 mg total) by mouth every 8 (eight) hours as needed. 30 tablet 0  . levothyroxine (SYNTHROID) 150 MCG tablet TAKE 1 TABLET BY MOUTH EVERY OTHER DAY 45 tablet 3  . montelukast (SINGULAIR) 10 MG tablet TAKE 1 TABLET BY MOUTH EVERYDAY AT BEDTIME 90 tablet 3  . SYNTHROID 175 MCG tablet Take 175 mcg by mouth every other day.     . venlafaxine XR (EFFEXOR XR) 37.5 MG 24 hr capsule Take 1 capsule (37.5 mg total) by mouth daily. 30 capsule 2  . rosuvastatin (CRESTOR) 5 MG tablet Take 0.5 tablets (2.5 mg total) by mouth daily. 90 tablet 3   No current facility-administered medications for this visit.     ALLERGIES: Orencia [abatacept], Shellfish allergy, Strawberry (diagnostic), Gluten meal, Lortab [hydrocodone-acetaminophen], and Morphine and related  Family History  Problem Relation Age of Onset  . Arthritis Mother   . Cancer Mother 4       breast ca--dec age  70  . Miscarriages / [place] Mother   . Breast cancer Mother 57  . Diabetes Father   . Heart attack Father   . Heart disease Father        pacemaker   . Hyperlipidemia Father   . Hypertension Father   . Stroke Father        x4  . COPD Sister   . Peripheral Artery Disease Sister   . Asthma Brother   . Birth defects  Brother   . Cancer Brother        skin  . Hyperlipidemia Brother   . Heart attack Maternal Grandmother   . Heart disease Maternal Grandmother   . Hyperlipidemia Maternal Grandmother   . Stroke Maternal Grandmother   . Alcohol abuse Paternal Grandmother   . Cancer Paternal Grandmother        colon  . Cancer Paternal Grandfather        breast  . Heart attack Paternal Grandfather   . Breast cancer Paternal Grandfather 64  . Alcohol abuse Brother   . Early death Brother        suicide  . Mental illness Brother   . Asthma Daughter   . Asthma Son   . Asthma Son   . Breast cancer Maternal Aunt 60  . Kidney cancer Paternal Aunt 85  . Breast cancer Paternal Aunt 26  . Throat cancer Paternal Uncle   . Thyroid cancer Paternal Uncle   . Breast cancer Cousin 64  . Prostate cancer Cousin     Social History   Socioeconomic History  . Marital status: Married    Spouse name: Not on file  . Number of children: Not on file  . Years of education: Not on file  . Highest education level: Not on file  Occupational History  . Not on file  Tobacco Use  . Smoking status: Never Smoker  . Smokeless tobacco: Never Used  Vaping Use  . Vaping Use: Never used  Substance and Sexual Activity  . Alcohol use: Yes    Comment: occ  . Drug use: Never  . Sexual activity: Yes    Partners: Male    Birth control/protection: Surgical    Comment: Hyst-ovaries remain  Other Topics Concern  . Not on file  Social History Narrative  . Not on file   Social Determinants of Health   Financial Resource Strain:   . Difficulty of Paying Living Expenses: Not on file  Food  Insecurity:   . Worried About Charity fundraiser in the Last Year: Not on file  . Ran Out of Food in the Last Year: Not on file  Transportation Needs:   . Lack of Transportation (Medical): Not on file  . Lack of Transportation (Non-Medical): Not on file  Physical Activity:   . Days of Exercise per Week: Not on file  . Minutes of Exercise per Session: Not on file  Stress:   . Feeling of Stress : Not on file  Social Connections:   . Frequency of Communication with Friends and Family: Not on file  . Frequency of Social Gatherings with Friends and Family: Not on file  . Attends Religious Services: Not on file  . Active Member of Clubs or Organizations: Not on file  . Attends Archivist Meetings: Not on file  . Marital Status: Not on file  Intimate Partner Violence:   . Fear of Current or Ex-Partner: Not on file  . Emotionally Abused: Not on file  . Physically Abused: Not on file  . Sexually Abused: Not on file    Review of Systems  All other systems reviewed and are negative.   PHYSICAL EXAMINATION:    BP 128/82 (Cuff Size: Large)   Pulse 80   Ht 5' 5.25" (1.657 m)   Wt 190 lb (86.2 kg)   LMP  (LMP Unknown)   BMI 31.38 kg/m     General appearance: alert, cooperative and appears stated age  Pelvic:  External genitalia:  SP incisions intact.  Perineum with rosy erythema.               Urethra:  normal appearing urethra with no masses, tenderness or lesions              Bartholins and Skenes: normal                 Vagina: anterior vaginal suture intact.  Posterior vaginal wall with resolving hematoma.  Opening and cloudy orange fluid and tiny amount of dark clot draining.   Area cleansed with Hibiclens and Q-Tiops.               Cervix: no lesions                Bimanual Exam:  Uterus:  Absent.  Rectovaginal fullness, less than last visit.  Nontender.               Adnexa: no mass, fullness, tenderness              Rectal exam: Yes.  .  Confirms.               Anus:  normal sphincter tone, no lesions  Chaperone was present for exam.  ASSESSMENT  Post op vaginal hematoma and possible abscess draining.  Status post anterior and posterior colporrhaphy with TVT/cystoscopy. Prior supracervical hysterectomy.  Bruising tendency.   PLAN  CBC with diff.  Start Augmentin po bid x 14 days.  Rx for Diflucan.  Follow up in 3 days, sooner if needed. Heme onc follow up referral made.

## 2020-07-05 ENCOUNTER — Other Ambulatory Visit: Payer: Self-pay

## 2020-07-05 ENCOUNTER — Encounter: Payer: Self-pay | Admitting: Obstetrics and Gynecology

## 2020-07-05 ENCOUNTER — Ambulatory Visit (INDEPENDENT_AMBULATORY_CARE_PROVIDER_SITE_OTHER): Payer: No Typology Code available for payment source | Admitting: Obstetrics and Gynecology

## 2020-07-05 ENCOUNTER — Telehealth: Payer: Self-pay

## 2020-07-05 VITALS — BP 128/82 | HR 80 | Ht 65.25 in | Wt 190.0 lb

## 2020-07-05 DIAGNOSIS — N898 Other specified noninflammatory disorders of vagina: Secondary | ICD-10-CM

## 2020-07-05 MED ORDER — FLUCONAZOLE 150 MG PO TABS: 150.0000 mg | ORAL_TABLET | Freq: Once | ORAL | 0 refills | Status: AC

## 2020-07-05 MED ORDER — AMOXICILLIN-POT CLAVULANATE 875-125 MG PO TABS: 1.0000 | ORAL_TABLET | Freq: Two times a day (BID) | ORAL | 0 refills | Status: DC

## 2020-07-05 NOTE — Telephone Encounter (Signed)
Call returned to patient. Left detailed message, name identified on voicemail, ok per dpr.  Left message advising appt scheduled for 10/21 at 10:30am, arrive at 10:15am, on 07/08/20 with Dr. Quincy Simmonds. Return call to office if you need to r/s.   Encounter closed.

## 2020-07-05 NOTE — Telephone Encounter (Signed)
Patient needs to return in about 3 days (around 07/08/2020) for recheck per Dr. Quincy Simmonds. No appointments available, need triage to assist.

## 2020-07-06 LAB — CBC WITH DIFFERENTIAL/PLATELET
Basophils Absolute: 0.1 10*3/uL (ref 0.0–0.2)
Basos: 1 %
EOS (ABSOLUTE): 0.2 10*3/uL (ref 0.0–0.4)
Eos: 3 %
Hematocrit: 35.3 % (ref 34.0–46.6)
Hemoglobin: 12.2 g/dL (ref 11.1–15.9)
Immature Grans (Abs): 0 10*3/uL (ref 0.0–0.1)
Immature Granulocytes: 0 %
Lymphocytes Absolute: 1.8 10*3/uL (ref 0.7–3.1)
Lymphs: 22 %
MCH: 30.3 pg (ref 26.6–33.0)
MCHC: 34.6 g/dL (ref 31.5–35.7)
MCV: 88 fL (ref 79–97)
Monocytes Absolute: 0.6 10*3/uL (ref 0.1–0.9)
Monocytes: 7 %
Neutrophils Absolute: 5.6 10*3/uL (ref 1.4–7.0)
Neutrophils: 67 %
Platelets: 275 10*3/uL (ref 150–450)
RBC: 4.03 x10E6/uL (ref 3.77–5.28)
RDW: 12.7 % (ref 11.7–15.4)
WBC: 8.3 10*3/uL (ref 3.4–10.8)

## 2020-07-07 NOTE — Progress Notes (Signed)
GYNECOLOGY  VISIT   HPI: 54 y.o.   Married  Caucasian  female   [phone] with No LMP recorded (lmp unknown). Patient has had a hysterectomy.   here for follow up ANTERIOR (CYSTOCELE) AND POSTERIOR REPAIR (RECTOCELE) (N/A Vagina ) TRANSVAGINAL TAPE (TVT) PROCEDURE/EXACT MIDURETHRAL SLING (N/A Vagina ) CYSTOSCOPY (N/A Bladder).  Has a draining posterior vaginal hematoma.  Now on Augmentin due to cloudy drainage.  No drainage since yesterday afternoon.  No fever, shakes, chills or nausea. No diarrhea.  Has not needed the Diflucan.   Normal CBC.  Not leaking with a sneeze and really happy about this. GYNECOLOGIC HISTORY: No LMP recorded (lmp unknown). Patient has had a hysterectomy. Contraception: Hyst--ovaries remain Menopausal hormone therapy:  none Last mammogram:  11-21-19 3D/Neg/density B/BiRads1 Last pap smear: 10-16-19 Neg:Neg HR HPV,2019 normal per patient        OB History    Gravida  5   Para  3   Term      Preterm      AB  2   Living  3     SAB  2   TAB      Ectopic      Multiple      Live Births                 Patient Active Problem List   Diagnosis Date Noted  . Status post surgery 06/14/2020  . Genetic testing 02/18/2020  . Family history of breast cancer   . High risk medication use 10/30/2019  . Left bundle branch block 04/28/2019  . Right wrist pain 03/13/2019  . Hamstring strain, left, initial encounter 02/20/2019  . 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 Primary osteoarthritis of both feet 10/09/2018  . Hx of migraines 09/27/2018  . History of gastroesophageal reflux (GERD) 09/27/2018  . 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 Moderate persistent asthma 07/05/2018  . Hypothyroid 06/28/2018    Past Medical History:  Diagnosis Date  . Arthritis    ra zero negative  . Asthma    mild/moderate persistent asthma  . Complication of anesthesia    slow to awaken 25 yrs ago  . Elevated liver function tests dr Neil Crouch pcp manages   for last year  . Family history of breast cancer   . Fibroid   . Frequent headaches   . GERD (gastroesophageal reflux disease)   . Gluten intolerance   . H/O seasonal allergies   . History of chicken pox   . History of COVID-19 07/2019   high fever sob, x 7 days all symptoms resolved  . Left bundle branch block 02/2019   Dr.Paula Ross  . Migraines   . PONV (postoperative nausea and vomiting)    ponv with several surgeries, likes scopolamine patch  . Thyroid disease    Multiple benign nodules--thyroid removed    Past Surgical History:  Procedure Laterality Date  . ANTERIOR AND POSTERIOR REPAIR N/A 06/14/2020   Procedure: ANTERIOR (CYSTOCELE) AND POSTERIOR REPAIR (RECTOCELE);  Surgeon: Nunzio Cobbs, MD;  Location: [facility];  Service: Gynecology;  Laterality: N/A;  . BLADDER SUSPENSION N/A 06/14/2020   Procedure: TRANSVAGINAL TAPE (TVT) PROCEDURE/EXACT MIDURETHRAL SLING;  Surgeon: Nunzio Cobbs, MD;  Location: [facility];  Service: Gynecology;  Laterality: N/A;  EXACT MIDURETHRAL SLING  . CARDIAC CATHETERIZATION  2009   results normal done in [place]  . CHOLECYSTECTOMY  1995   laparoscopic  .  CYSTOSCOPY N/A 06/14/2020   Procedure: CYSTOSCOPY;  Surgeon: Nunzio Cobbs, MD;  Location[facility];  Service: Gynecology;  Laterality: N/A;  . HAMMER TOE SURGERY Bilateral 2009  . HERNIA REPAIR  2015   Has abdominal wall mesh - [place], [place].  Laparoscopic incisional hernia repari with 7 x 9 inch Ventralight ST with Echo mesh  . LAPAROSCOPIC HYSTERECTOMY  2007   Supracervical hysterectomy with cystosctopy - [place], [place] partial  . THYROID SURGERY  2013   total thyroidectomy due to 11 nodules    Current Outpatient Medications  Medication Sig Dispense Refill  . acetaminophen (TYLENOL) 500 MG tablet Take 500 mg by mouth every 6 (six) hours as needed.    Marland Kitchen ADVAIR DISKUS 250-50  MCG/DOSE AEPB INHALE 1 PUFF BY MOUTH TWICE A DAY 180 each 3  . albuterol (VENTOLIN HFA) 108 (90 Base) MCG/ACT inhaler TAKE 2 PUFFS BY MOUTH EVERY 6 HOURS AS NEEDED FOR WHEEZE OR SHORTNESS OF BREATH 25.5 g 5  . amoxicillin-clavulanate (AUGMENTIN) 875-125 MG tablet Take 1 tablet by mouth 2 (two) times daily. Take for 14 days. 28 tablet 0  . cholecalciferol (VITAMIN D) 400 units TABS tablet Take 5,000 Units by mouth.     . cyclobenzaprine (FLEXERIL) 10 MG tablet Take 1 tablet (10 mg total) by mouth 3 (three) times daily as needed for muscle spasms. 30 tablet 0  . Diclofenac Sodium ([name]) 2 % SOLN Place 1 application onto the skin 2 (two) times daily. (Patient taking differently: Place 1 application onto the skin daily as needed. ) 112 g 2  . fexofenadine-pseudoephedrine (ALLEGRA-D 24) 180-240 MG 24 hr tablet One tablet as needed daily for allergies  (Patient taking differently: daily. ) 30 tablet 5  . fluconazole (DIFLUCAN) 150 MG tablet Take 150 mg by mouth once.    . gabapentin (NEURONTIN) 800 MG tablet Take 1 tablet (800 mg total) by mouth at bedtime. 90 tablet 1  . hydroxychloroquine (PLAQUENIL) 200 MG tablet TAKE 1 TABLET BY MOUTH TWICE A DAY 180 tablet 0  . ibuprofen (ADVIL) 800 MG tablet Take 1 tablet (800 mg total) by mouth every 8 (eight) hours as needed. 30 tablet 0  . levothyroxine (SYNTHROID) 150 MCG tablet TAKE 1 TABLET BY MOUTH EVERY OTHER DAY 45 tablet 3  . montelukast (SINGULAIR) 10 MG tablet TAKE 1 TABLET BY MOUTH EVERYDAY AT BEDTIME 90 tablet 3  . SYNTHROID 175 MCG tablet Take 175 mcg by mouth every other day.     . venlafaxine XR (EFFEXOR XR) 37.5 MG 24 hr capsule Take 1 capsule (37.5 mg total) by mouth daily. 30 capsule 2  . rosuvastatin (CRESTOR) 5 MG tablet Take 0.5 tablets (2.5 mg total) by mouth daily. 90 tablet 3   No current facility-administered medications for this visit.     ALLERGIES: Orencia [abatacept], Shellfish allergy, Strawberry (diagnostic), Gluten meal,  Lortab [hydrocodone-acetaminophen], and Morphine and related  Family History  Problem Relation Age of Onset  . Arthritis Mother   . Cancer Mother 46       breast ca--dec age 69  . Miscarriages / [place] Mother   . Breast cancer Mother 76  . Diabetes Father   . Heart attack Father   . Heart disease Father        pacemaker   . Hyperlipidemia Father   . Hypertension Father   . Stroke Father        x4  . COPD Sister   . Peripheral Artery  Disease Sister   . Asthma Brother   . Birth defects Brother   . Cancer Brother        skin  . Hyperlipidemia Brother   . Heart attack Maternal Grandmother   . Heart disease Maternal Grandmother   . Hyperlipidemia Maternal Grandmother   . Stroke Maternal Grandmother   . Alcohol abuse Paternal Grandmother   . Cancer Paternal Grandmother        colon  . Cancer Paternal Grandfather        breast  . Heart attack Paternal Grandfather   . Breast cancer Paternal Grandfather 75  . Alcohol abuse Brother   . Early death Brother        suicide  . Mental illness Brother   . Asthma Daughter   . Asthma Son   . Asthma Son   . Breast cancer Maternal Aunt 60  . Kidney cancer Paternal Aunt 85  . Breast cancer Paternal Aunt 65  . Throat cancer Paternal Uncle   . Thyroid cancer Paternal Uncle   . Breast cancer Cousin 35  . Prostate cancer Cousin     Social History   Socioeconomic History  . Marital status: Married    Spouse name: Not on file  . Number of children: Not on file  . Years of education: Not on file  . Highest education level: Not on file  Occupational History  . Not on file  Tobacco Use  . Smoking status: Never Smoker  . Smokeless tobacco: Never Used  Vaping Use  . Vaping Use: Never used  Substance and Sexual Activity  . Alcohol use: Yes    Comment: occ  . Drug use: Never  . Sexual activity: Yes    Partners: Male    Birth control/protection: Surgical    Comment: Hyst-ovaries remain  Other Topics Concern  . Not on file   Social History Narrative  . Not on file   Social Determinants of Health   Financial Resource Strain:   . Difficulty of Paying Living Expenses: Not on file  Food Insecurity:   . Worried About Charity fundraiser in the Last Year: Not on file  . Ran Out of Food in the Last Year: Not on file  Transportation Needs:   . Lack of Transportation (Medical): Not on file  . Lack of Transportation (Non-Medical): Not on file  Physical Activity:   . Days of Exercise per Week: Not on file  . Minutes of Exercise per Session: Not on file  Stress:   . Feeling of Stress : Not on file  Social Connections:   . Frequency of Communication with Friends and Family: Not on file  . Frequency of Social Gatherings with Friends and Family: Not on file  . Attends Religious Services: Not on file  . Active Member of Clubs or Organizations: Not on file  . Attends Archivist Meetings: Not on file  . Marital Status: Not on file  Intimate Partner Violence:   . Fear of Current or Ex-Partner: Not on file  . Emotionally Abused: Not on file  . Physically Abused: Not on file  . Sexually Abused: Not on file    Review of Systems  All other systems reviewed and are negative.   PHYSICAL EXAMINATION:    BP 138/84 (Cuff Size: Large)   Pulse 88   Ht 5' 5.25" (1.657 m)   Wt 190 lb (86.2 kg)   LMP  (LMP Unknown)   SpO2 98%   BMI  31.38 kg/m     General appearance: alert, cooperative and appears stated age   Pelvic: External genitalia:  Erythema of vulva.               Urethra:  normal appearing urethra with no masses, tenderness or lesions              Bartholins and Skenes: normal                 Vagina: sling protected.  Anterior suture line intact.  Posterior vaginal hematoma present.  Less edema today on exam.  Mucousy yellow drainage - small amount.  Posterior sutures starting to loosen.  No separation of posterior vaginal mucosa.              Cervix: no lesions                Bimanual Exam:   Uterus: absent.              Adnexa: no mass, fullness, tenderness          Chaperone was present for exam.  ASSESSMENT  Post op vaginal hematoma.  Resolving.  Abscess resolving.   PLAN  Finish course of Augmentin.  Continue conservative management.  Fu in one week.

## 2020-07-08 ENCOUNTER — Encounter: Payer: Self-pay | Admitting: Obstetrics and Gynecology

## 2020-07-08 ENCOUNTER — Ambulatory Visit: Payer: No Typology Code available for payment source | Admitting: Obstetrics and Gynecology

## 2020-07-08 ENCOUNTER — Other Ambulatory Visit: Payer: Self-pay

## 2020-07-08 ENCOUNTER — Telehealth: Payer: Self-pay

## 2020-07-08 VITALS — BP 138/84 | HR 88 | Ht 65.25 in | Wt 190.0 lb

## 2020-07-08 DIAGNOSIS — N898 Other specified noninflammatory disorders of vagina: Secondary | ICD-10-CM

## 2020-07-08 NOTE — Telephone Encounter (Signed)
Spoke with patient, OV scheduled for 10/28 at 4:30pm with Dr. Quincy Simmonds. Patient is agreeable to date and time.   Encounter closed.

## 2020-07-08 NOTE — Telephone Encounter (Signed)
New message    The patient was seen today with Dr. Quincy Simmonds.   Return in about 1 week (around 07/15/2020) for recheck - Dr. Quincy Simmonds.   Please advise / patient voiced she is flexible

## 2020-07-13 NOTE — Progress Notes (Deleted)
GYNECOLOGY  VISIT   HPI: 54 y.o.   Married  Caucasian  female   [phone] with No LMP recorded (lmp unknown). Patient has had a hysterectomy.   here for 4 week status post ANTERIOR (CYSTOCELE) AND POSTERIOR REPAIR (RECTOCELE) (N/A Vagina ) TRANSVAGINAL TAPE (TVT) PROCEDURE/EXACT MIDURETHRAL SLING (N/A Vagina ) CYSTOSCOPY (N/A Bladder).   GYNECOLOGIC HISTORY: No LMP recorded (lmp unknown). Patient has had a hysterectomy. Contraception:  Hyst--ovaries remain Menopausal hormone therapy:  None Last mammogram: 11-21-19 3D/Neg/density B/BiRads1 Last pap smear: 10-16-19 Neg:Neg HR HPV,2019 normal per patient        OB History    Gravida  5   Para  3   Term      Preterm      AB  2   Living  3     SAB  2   TAB      Ectopic      Multiple      Live Births                 Patient Active Problem List   Diagnosis Date Noted  . Status post surgery 06/14/2020  . Genetic testing 02/18/2020  . Family history of breast cancer   . High risk medication use 10/30/2019  . Left bundle branch block 04/28/2019  . Right wrist pain 03/13/2019  . Hamstring strain, left, initial encounter 02/20/2019  . 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 Primary osteoarthritis of both feet 10/09/2018  . Hx of migraines 09/27/2018  . History of gastroesophageal reflux (GERD) 09/27/2018  . 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 Moderate persistent asthma 07/05/2018  . Hypothyroid 06/28/2018    Past Medical History:  Diagnosis Date  . Arthritis    ra zero negative  . Asthma    mild/moderate persistent asthma  . Complication of anesthesia    slow to awaken 25 yrs ago  . Elevated liver function tests dr Neil Crouch pcp manages   for last year  . Family history of breast cancer   . Fibroid   . Frequent headaches   . GERD (gastroesophageal reflux disease)   . Gluten intolerance   . H/O seasonal allergies   . History of chicken pox   . History of COVID-19 07/2019   high  fever sob, x 7 days all symptoms resolved  . Left bundle branch block 02/2019   Dr.Paula Ross  . Migraines   . PONV (postoperative nausea and vomiting)    ponv with several surgeries, likes scopolamine patch  . Thyroid disease    Multiple benign nodules--thyroid removed    Past Surgical History:  Procedure Laterality Date  . ANTERIOR AND POSTERIOR REPAIR N/A 06/14/2020   Procedure: ANTERIOR (CYSTOCELE) AND POSTERIOR REPAIR (RECTOCELE);  Surgeon: Nunzio Cobbs, MD;  Location[facility];  Service: Gynecology;  Laterality: N/A;  . BLADDER SUSPENSION N/A 06/14/2020   Procedure: TRANSVAGINAL TAPE (TVT) PROCEDURE/EXACT MIDURETHRAL SLING;  Surgeon: Nunzio Cobbs, MD;  Location: [facility];  Service: Gynecology;  Laterality: N/A;  EXACT MIDURETHRAL SLING  . CARDIAC CATHETERIZATION  2009   results normal done in [place]  . CHOLECYSTECTOMY  1995   laparoscopic  . CYSTOSCOPY N/A 06/14/2020   Procedure: CYSTOSCOPY;  Surgeon: Nunzio Cobbs, MD;  Location: [facility];  Service: Gynecology;  Laterality: N/A;  . HAMMER TOE SURGERY Bilateral 2009  . HERNIA REPAIR  2015   Has abdominal wall mesh -  [place], [place].  Laparoscopic incisional hernia repari with 7 x 9 inch Ventralight ST with Echo mesh  . LAPAROSCOPIC HYSTERECTOMY  2007   Supracervical hysterectomy with cystosctopy - [place], [place] partial  . THYROID SURGERY  2013   total thyroidectomy due to 11 nodules    Current Outpatient Medications  Medication Sig Dispense Refill  . acetaminophen (TYLENOL) 500 MG tablet Take 500 mg by mouth every 6 (six) hours as needed.    Marland Kitchen ADVAIR DISKUS 250-50 MCG/DOSE AEPB INHALE 1 PUFF BY MOUTH TWICE A DAY 180 each 3  . albuterol (VENTOLIN HFA) 108 (90 Base) MCG/ACT inhaler TAKE 2 PUFFS BY MOUTH EVERY 6 HOURS AS NEEDED FOR WHEEZE OR SHORTNESS OF BREATH 25.5 g 5  . amoxicillin-clavulanate (AUGMENTIN) 875-125 MG tablet Take 1 tablet  by mouth 2 (two) times daily. Take for 14 days. 28 tablet 0  . cholecalciferol (VITAMIN D) 400 units TABS tablet Take 5,000 Units by mouth.     . cyclobenzaprine (FLEXERIL) 10 MG tablet Take 1 tablet (10 mg total) by mouth 3 (three) times daily as needed for muscle spasms. 30 tablet 0  . Diclofenac Sodium ([name]) 2 % SOLN Place 1 application onto the skin 2 (two) times daily. (Patient taking differently: Place 1 application onto the skin daily as needed. ) 112 g 2  . fexofenadine-pseudoephedrine (ALLEGRA-D 24) 180-240 MG 24 hr tablet One tablet as needed daily for allergies  (Patient taking differently: daily. ) 30 tablet 5  . fluconazole (DIFLUCAN) 150 MG tablet Take 150 mg by mouth once.    . gabapentin (NEURONTIN) 800 MG tablet Take 1 tablet (800 mg total) by mouth at bedtime. 90 tablet 1  . hydroxychloroquine (PLAQUENIL) 200 MG tablet TAKE 1 TABLET BY MOUTH TWICE A DAY 180 tablet 0  . ibuprofen (ADVIL) 800 MG tablet Take 1 tablet (800 mg total) by mouth every 8 (eight) hours as needed. 30 tablet 0  . levothyroxine (SYNTHROID) 150 MCG tablet TAKE 1 TABLET BY MOUTH EVERY OTHER DAY 45 tablet 3  . montelukast (SINGULAIR) 10 MG tablet TAKE 1 TABLET BY MOUTH EVERYDAY AT BEDTIME 90 tablet 3  . rosuvastatin (CRESTOR) 5 MG tablet Take 0.5 tablets (2.5 mg total) by mouth daily. 90 tablet 3  . SYNTHROID 175 MCG tablet Take 175 mcg by mouth every other day.     . venlafaxine XR (EFFEXOR XR) 37.5 MG 24 hr capsule Take 1 capsule (37.5 mg total) by mouth daily. 30 capsule 2   No current facility-administered medications for this visit.     ALLERGIES: Orencia [abatacept], Shellfish allergy, Strawberry (diagnostic), Gluten meal, Lortab [hydrocodone-acetaminophen], and Morphine and related  Family History  Problem Relation Age of Onset  . Arthritis Mother   . Cancer Mother 57       breast ca--dec age 61  . Miscarriages / [place] Mother   . Breast cancer Mother 14  . Diabetes Father   . Heart  attack Father   . Heart disease Father        pacemaker   . Hyperlipidemia Father   . Hypertension Father   . Stroke Father        x4  . COPD Sister   . Peripheral Artery Disease Sister   . Asthma Brother   . Birth defects Brother   . Cancer Brother        skin  . Hyperlipidemia Brother   . Heart attack Maternal Grandmother   . Heart disease Maternal Grandmother   .  Hyperlipidemia Maternal Grandmother   . Stroke Maternal Grandmother   . Alcohol abuse Paternal Grandmother   . Cancer Paternal Grandmother        colon  . Cancer Paternal Grandfather        breast  . Heart attack Paternal Grandfather   . Breast cancer Paternal Grandfather 75  . Alcohol abuse Brother   . Early death Brother        suicide  . Mental illness Brother   . Asthma Daughter   . Asthma Son   . Asthma Son   . Breast cancer Maternal Aunt 60  . Kidney cancer Paternal Aunt 85  . Breast cancer Paternal Aunt 59  . Throat cancer Paternal Uncle   . Thyroid cancer Paternal Uncle   . Breast cancer Cousin 16  . Prostate cancer Cousin     Social History   Socioeconomic History  . Marital status: Married    Spouse name: Not on file  . Number of children: Not on file  . Years of education: Not on file  . Highest education level: Not on file  Occupational History  . Not on file  Tobacco Use  . Smoking status: Never Smoker  . Smokeless tobacco: Never Used  Vaping Use  . Vaping Use: Never used  Substance and Sexual Activity  . Alcohol use: Yes    Comment: occ  . Drug use: Never  . Sexual activity: Yes    Partners: Male    Birth control/protection: Surgical    Comment: Hyst-ovaries remain  Other Topics Concern  . Not on file  Social History Narrative  . Not on file   Social Determinants of Health   Financial Resource Strain:   . Difficulty of Paying Living Expenses: Not on file  Food Insecurity:   . Worried About Charity fundraiser in the Last Year: Not on file  . Ran Out of Food in the  Last Year: Not on file  Transportation Needs:   . Lack of Transportation (Medical): Not on file  . Lack of Transportation (Non-Medical): Not on file  Physical Activity:   . Days of Exercise per Week: Not on file  . Minutes of Exercise per Session: Not on file  Stress:   . Feeling of Stress : Not on file  Social Connections:   . Frequency of Communication with Friends and Family: Not on file  . Frequency of Social Gatherings with Friends and Family: Not on file  . Attends Religious Services: Not on file  . Active Member of Clubs or Organizations: Not on file  . Attends Archivist Meetings: Not on file  . Marital Status: Not on file  Intimate Partner Violence:   . Fear of Current or Ex-Partner: Not on file  . Emotionally Abused: Not on file  . Physically Abused: Not on file  . Sexually Abused: Not on file    Review of Systems  PHYSICAL EXAMINATION:    LMP  (LMP Unknown)     General appearance: alert, cooperative and appears stated age Head: Normocephalic, without obvious abnormality, atraumatic Neck: no adenopathy, supple, symmetrical, trachea midline and thyroid normal to inspection and palpation Lungs: clear to auscultation bilaterally Breasts: normal appearance, no masses or tenderness, No nipple retraction or dimpling, No nipple discharge or bleeding, No axillary or supraclavicular adenopathy Heart: regular rate and rhythm Abdomen: soft, non-tender, no masses,  no organomegaly Extremities: extremities normal, atraumatic, no cyanosis or edema Skin: Skin color, texture, turgor normal. No rashes  or lesions Lymph nodes: Cervical, supraclavicular, and axillary nodes normal. No abnormal inguinal nodes palpated Neurologic: Grossly normal  Pelvic: External genitalia:  no lesions              Urethra:  normal appearing urethra with no masses, tenderness or lesions              Bartholins and Skenes: normal                 Vagina: normal appearing vagina with normal  color and discharge, no lesions              Cervix: no lesions                Bimanual Exam:  Uterus:  normal size, contour, position, consistency, mobility, non-tender              Adnexa: no mass, fullness, tenderness              Rectal exam: {yes no:[id number]}.  Confirms.              Anus:  normal sphincter tone, no lesions  Chaperone was present for exam.  ASSESSMENT     PLAN     An After Visit Summary was printed and given to the patient.  ______ minutes face to face time of which over 50% was spent in counseling.

## 2020-07-15 ENCOUNTER — Ambulatory Visit: Payer: Self-pay | Admitting: Obstetrics and Gynecology

## 2020-07-17 ENCOUNTER — Encounter: Payer: Self-pay | Admitting: Obstetrics and Gynecology

## 2020-07-17 ENCOUNTER — Other Ambulatory Visit: Payer: Self-pay | Admitting: Obstetrics and Gynecology

## 2020-07-17 MED ORDER — FLUCONAZOLE 150 MG PO TABS: 150.0000 mg | ORAL_TABLET | Freq: Once | ORAL | 0 refills | Status: AC

## 2020-07-17 NOTE — Progress Notes (Signed)
See My Chart message for request for treatment for yeast infection.

## 2020-07-19 NOTE — Progress Notes (Signed)
GYNECOLOGY  VISIT   HPI: 54 y.o.   Married  Caucasian  female   [phone] with No LMP recorded (lmp unknown). Patient has had a hysterectomy.   here for 5 weeks status post ANTERIOR (CYSTOCELE) AND POSTERIOR REPAIR (RECTOCELE) (N/A Vagina ) TRANSVAGINAL TAPE (TVT) PROCEDURE/EXACT MIDURETHRAL SLING (N/A Vagina ) CYSTOSCOPY (N/A Bladder).  Stopped Augmentin due to rash.  No problems with shortness of breath or tongue swelling.  She treated with Benadryl.   She got a vaginal yeast infection, and she took her second dose of Diflucan.   No vaginal drainage or bleeding.   A little bit of internal vaginal soreness, which feels like a bruise.  No fever.   Still off Rinvoq.      GYNECOLOGIC HISTORY: No LMP recorded (lmp unknown). Patient has had a hysterectomy. Contraception:  Hyst--ovaries remain Menopausal hormone therapy: NA Last mammogram: 11-21-19 3D/Neg/density B/BiRads1 Last pap smear: 10-16-19 Neg:Neg HR HPV,2019 normal per patient        OB History    Gravida  5   Para  3   Term      Preterm      AB  2   Living  3     SAB  2   TAB      Ectopic      Multiple      Live Births                 Patient Active Problem List   Diagnosis Date Noted  . Status post surgery 06/14/2020  . Genetic testing 02/18/2020  . Family history of breast cancer   . High risk medication use 10/30/2019  . Left bundle branch block 04/28/2019  . Right wrist pain 03/13/2019  . Hamstring strain, left, initial encounter 02/20/2019  . 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 Primary osteoarthritis of both feet 10/09/2018  . Hx of migraines 09/27/2018  . History of gastroesophageal reflux (GERD) 09/27/2018  . 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 Moderate persistent asthma 07/05/2018  . Hypothyroid 06/28/2018    Past Medical History:  Diagnosis Date  . Arthritis    ra zero negative  . Asthma    mild/moderate persistent asthma  . Complication of anesthesia     slow to awaken 25 yrs ago  . Elevated liver function tests dr Neil Crouch pcp manages   for last year  . Family history of breast cancer   . Fibroid   . Frequent headaches   . GERD (gastroesophageal reflux disease)   . Gluten intolerance   . H/O seasonal allergies   . History of chicken pox   . History of COVID-19 07/2019   high fever sob, x 7 days all symptoms resolved  . Left bundle branch block 02/2019   Dr.Paula Ross  . Migraines   . PONV (postoperative nausea and vomiting)    ponv with several surgeries, likes scopolamine patch  . Thyroid disease    Multiple benign nodules--thyroid removed    Past Surgical History:  Procedure Laterality Date  . ANTERIOR AND POSTERIOR REPAIR N/A 06/14/2020   Procedure: ANTERIOR (CYSTOCELE) AND POSTERIOR REPAIR (RECTOCELE);  Surgeon: Nunzio Cobbs, MD;  Location: [facility];  Service: Gynecology;  Laterality: N/A;  . BLADDER SUSPENSION N/A 06/14/2020   Procedure: TRANSVAGINAL TAPE (TVT) PROCEDURE/EXACT MIDURETHRAL SLING;  Surgeon: Nunzio Cobbs, MD;  Location: [facility];  Service: Gynecology;  Laterality: N/A;  EXACT MIDURETHRAL SLING  .  CARDIAC CATHETERIZATION  2009   results normal done in [place]  . CHOLECYSTECTOMY  1995   laparoscopic  . CYSTOSCOPY N/A 06/14/2020   Procedure: CYSTOSCOPY;  Surgeon: Nunzio Cobbs, MD;  Location: [facility];  Service: Gynecology;  Laterality: N/A;  . HAMMER TOE SURGERY Bilateral 2009  . HERNIA REPAIR  2015   Has abdominal wall mesh - [place], [place].  Laparoscopic incisional hernia repari with 7 x 9 inch Ventralight ST with Echo mesh  . LAPAROSCOPIC HYSTERECTOMY  2007   Supracervical hysterectomy with cystosctopy - [place], [place] partial  . THYROID SURGERY  2013   total thyroidectomy due to 11 nodules    Current Outpatient Medications  Medication Sig Dispense Refill  . acetaminophen (TYLENOL) 500 MG tablet Take 500 mg  by mouth every 6 (six) hours as needed.    Marland Kitchen ADVAIR DISKUS 250-50 MCG/DOSE AEPB INHALE 1 PUFF BY MOUTH TWICE A DAY 180 each 3  . albuterol (VENTOLIN HFA) 108 (90 Base) MCG/ACT inhaler TAKE 2 PUFFS BY MOUTH EVERY 6 HOURS AS NEEDED FOR WHEEZE OR SHORTNESS OF BREATH 25.5 g 5  . cholecalciferol (VITAMIN D) 400 units TABS tablet Take 5,000 Units by mouth.     . cyclobenzaprine (FLEXERIL) 10 MG tablet Take 1 tablet (10 mg total) by mouth 3 (three) times daily as needed for muscle spasms. 30 tablet 0  . Diclofenac Sodium ([name]) 2 % SOLN Place 1 application onto the skin 2 (two) times daily. (Patient taking differently: Place 1 application onto the skin daily as needed. ) 112 g 2  . fexofenadine-pseudoephedrine (ALLEGRA-D 24) 180-240 MG 24 hr tablet One tablet as needed daily for allergies  (Patient taking differently: daily. ) 30 tablet 5  . gabapentin (NEURONTIN) 800 MG tablet Take 1 tablet (800 mg total) by mouth at bedtime. 90 tablet 1  . hydroxychloroquine (PLAQUENIL) 200 MG tablet TAKE 1 TABLET BY MOUTH TWICE A DAY 180 tablet 0  . ibuprofen (ADVIL) 800 MG tablet Take 1 tablet (800 mg total) by mouth every 8 (eight) hours as needed. 30 tablet 0  . levothyroxine (SYNTHROID) 150 MCG tablet TAKE 1 TABLET BY MOUTH EVERY OTHER DAY 45 tablet 3  . montelukast (SINGULAIR) 10 MG tablet TAKE 1 TABLET BY MOUTH EVERYDAY AT BEDTIME 90 tablet 3  . SYNTHROID 175 MCG tablet Take 175 mcg by mouth every other day.     . venlafaxine XR (EFFEXOR XR) 37.5 MG 24 hr capsule Take 1 capsule (37.5 mg total) by mouth daily. 30 capsule 2  . rosuvastatin (CRESTOR) 5 MG tablet Take 0.5 tablets (2.5 mg total) by mouth daily. 90 tablet 3   No current facility-administered medications for this visit.     ALLERGIES: Orencia [abatacept], Shellfish allergy, Strawberry (diagnostic), Gluten meal, Lortab [hydrocodone-acetaminophen], Morphine and related, and Augmentin [amoxicillin-pot clavulanate]  Family History  Problem Relation  Age of Onset  . Arthritis Mother   . Cancer Mother 81       breast ca--dec age 55  . Miscarriages / [place] Mother   . Breast cancer Mother 89  . Diabetes Father   . Heart attack Father   . Heart disease Father        pacemaker   . Hyperlipidemia Father   . Hypertension Father   . Stroke Father        x4  . COPD Sister   . Peripheral Artery Disease Sister   . Asthma Brother   . Birth defects Brother   .  Cancer Brother        skin  . Hyperlipidemia Brother   . Heart attack Maternal Grandmother   . Heart disease Maternal Grandmother   . Hyperlipidemia Maternal Grandmother   . Stroke Maternal Grandmother   . Alcohol abuse Paternal Grandmother   . Cancer Paternal Grandmother        colon  . Cancer Paternal Grandfather        breast  . Heart attack Paternal Grandfather   . Breast cancer Paternal Grandfather 78  . Alcohol abuse Brother   . Early death Brother        suicide  . Mental illness Brother   . Asthma Daughter   . Asthma Son   . Asthma Son   . Breast cancer Maternal Aunt 60  . Kidney cancer Paternal Aunt 85  . Breast cancer Paternal Aunt 70  . Throat cancer Paternal Uncle   . Thyroid cancer Paternal Uncle   . Breast cancer Cousin 58  . Prostate cancer Cousin     Social History   Socioeconomic History  . Marital status: Married    Spouse name: Not on file  . Number of children: Not on file  . Years of education: Not on file  . Highest education level: Not on file  Occupational History  . Not on file  Tobacco Use  . Smoking status: Never Smoker  . Smokeless tobacco: Never Used  Vaping Use  . Vaping Use: Never used  Substance and Sexual Activity  . Alcohol use: Yes    Comment: occ  . Drug use: Never  . Sexual activity: Yes    Partners: Male    Birth control/protection: Surgical    Comment: Hyst-ovaries remain  Other Topics Concern  . Not on file  Social History Narrative  . Not on file   Social Determinants of Health   Financial  Resource Strain:   . Difficulty of Paying Living Expenses: Not on file  Food Insecurity:   . Worried About Charity fundraiser in the Last Year: Not on file  . Ran Out of Food in the Last Year: Not on file  Transportation Needs:   . Lack of Transportation (Medical): Not on file  . Lack of Transportation (Non-Medical): Not on file  Physical Activity:   . Days of Exercise per Week: Not on file  . Minutes of Exercise per Session: Not on file  Stress:   . Feeling of Stress : Not on file  Social Connections:   . Frequency of Communication with Friends and Family: Not on file  . Frequency of Social Gatherings with Friends and Family: Not on file  . Attends Religious Services: Not on file  . Active Member of Clubs or Organizations: Not on file  . Attends Archivist Meetings: Not on file  . Marital Status: Not on file  Intimate Partner Violence:   . Fear of Current or Ex-Partner: Not on file  . Emotionally Abused: Not on file  . Physically Abused: Not on file  . Sexually Abused: Not on file    Review of Systems  All other systems reviewed and are negative.   PHYSICAL EXAMINATION:    BP 122/80 (Cuff Size: Large)   Pulse 94   Ht 5' 5.25" (1.657 m)   Wt 190 lb (86.2 kg)   LMP  (LMP Unknown)   SpO2 98%   BMI 31.38 kg/m     General appearance: alert, cooperative and appears stated age  Skin:  Petechiae of skin of upper abdomen in midline.  Pelvic: External genitalia:  no lesions.  SP incisions intact.              Urethra:  normal appearing urethra with no masses, tenderness or lesions              Bartholins and Skenes: normal                 Vagina:  Suture loosened posteriorly and trimmed.  Small hematoma noted.  Vagina is more soft.  Sling is protected.               Cervix: no lesions                Bimanual Exam:  Uterus:  Absent.              Adnexa: no mass, fullness, tenderness        Chaperone was present for exam.  ASSESSMENT  Vaginal hematoma.   Resolving.  Easy bruising.  Petechiae. Allergy to Augmentin.  Current tx for yeast vaginitis.  Bladder polyp noted at time of recent surgery.  PLAN  Continue decreased activity.  No further abx needed at this time. Will likely start Rinvoq after her next office visit in 2 weeks.  Will facilitate a follow up with hematology/oncology and referral to urology.

## 2020-07-20 ENCOUNTER — Telehealth: Payer: Self-pay | Admitting: Obstetrics and Gynecology

## 2020-07-20 ENCOUNTER — Ambulatory Visit (INDEPENDENT_AMBULATORY_CARE_PROVIDER_SITE_OTHER): Payer: No Typology Code available for payment source | Admitting: Obstetrics and Gynecology

## 2020-07-20 ENCOUNTER — Encounter: Payer: Self-pay | Admitting: Obstetrics and Gynecology

## 2020-07-20 ENCOUNTER — Other Ambulatory Visit: Payer: Self-pay

## 2020-07-20 ENCOUNTER — Ambulatory Visit: Payer: 59 | Admitting: Obstetrics and Gynecology

## 2020-07-20 VITALS — BP 122/80 | HR 94 | Ht 65.25 in | Wt 190.0 lb

## 2020-07-20 DIAGNOSIS — N898 Other specified noninflammatory disorders of vagina: Secondary | ICD-10-CM

## 2020-07-20 NOTE — Telephone Encounter (Signed)
Please assist is referring patient back to hematology/oncology due to ongoing bruising of undetermined etiology.  She had a consultation with them prior to her surgery, and she continues to have bruising.   She developed a hematoma post op.

## 2020-07-20 NOTE — Telephone Encounter (Signed)
Please also check on referral to [facility] for bladder polyp noted intraoperatively at the time of her prolapse/TVT surgery.   [place] for patient to be seen in 3 - 4 weeks.

## 2020-07-20 NOTE — Telephone Encounter (Signed)
I did already place 2 referrals post op for the patient to be seen sooner for a follow up with hematology.  She continues to have bruising and had a post op hematoma.   Please assist in setting up an appointment for the patient to follow up with the hematologist, sooner than 6 months.

## 2020-07-20 NOTE — Telephone Encounter (Signed)
Spoke with pt. Pt states does not have follow up scheduled for post surgery hematoma with Dr Alen Blew. Referral placed for follow up. Previous referrals placed to Dr Alen Blew office cancelled. Per reviewed OV notes on 06/03/20, pt to have 6 month follow up.   Does pt need new referral placed again?   Pt also states not having a appt for bladder polyp at [facility]. Referral sent to [facility] on 06/22/20. Pt ok to be seen for consult in 3-4 weeks per Dr Quincy Simmonds.    Routing to Dr Quincy Simmonds for recommendations, follow up for Dr Alen Blew  Cc: Angie Fava for referral update

## 2020-07-21 ENCOUNTER — Telehealth: Payer: Self-pay | Admitting: Oncology

## 2020-07-21 ENCOUNTER — Other Ambulatory Visit: Payer: Self-pay | Admitting: Oncology

## 2020-07-21 ENCOUNTER — Telehealth: Payer: Self-pay

## 2020-07-21 ENCOUNTER — Ambulatory Visit: Payer: No Typology Code available for payment source | Admitting: Gastroenterology

## 2020-07-21 DIAGNOSIS — R58 Hemorrhage, not elsewhere classified: Secondary | ICD-10-CM

## 2020-07-21 NOTE — Telephone Encounter (Addendum)
Call placed to Dr Hazeline Junker office. Spoke with Maudie Mercury, LPN. Maudie Mercury states will send message to Dr Alen Blew for follow up appt for hematoma. Will send staff message back to me when has update.

## 2020-07-21 NOTE — Telephone Encounter (Signed)
Ok to close encounter. 

## 2020-07-21 NOTE — Telephone Encounter (Signed)
Pt has appt with Dr Alen Blew on 08/09/20.  Routing to Dr Quincy Simmonds for update   [facility] to close encounter?

## 2020-07-21 NOTE — Telephone Encounter (Signed)
-----   Message from Wyatt Portela, MD sent at 07/21/2020 11:44 AM EDT ----- I'm happy to revaluate her soon. I will arrange for repeat labs and follow up in the near future. Thanks ----- Message ----- From: Kennedy Bucker, LPN Sent: 07/25/3566  10:00 AM EDT To: Wyatt Portela, MD  Dr Elza Rafter nurse called stated that Dr Quincy Simmonds would like patient seen by you or another hematologist sooner than six months. States that patient had a post op hematoma and is still having bruising.  States they sent over referrals but they were denied. Please advise.    Kim LPN

## 2020-07-21 NOTE — Telephone Encounter (Signed)
Scheduled appt per 11/3 sch msg - pt is aware of appt date and time

## 2020-07-21 NOTE — Telephone Encounter (Signed)
Thank you for the update!

## 2020-07-21 NOTE — Telephone Encounter (Signed)
Update from Dr Hazeline Junker office  Barbarann Ehlers, Mickel Crow, LPN  Tacy Learn Jaci Standard, RN No, not yet. He is making the appointment his self.   Kim LPN        ----- Message -----  From: Georgia Lopes, RN  Sent: 07/21/2020 11:52 AM EDT  To: Kennedy Bucker, LPN  Subject: Appt with Dr Elayne Snare thank you so much Maudie Mercury for the update. Do you know when her appt will be so I can update Dr Quincy Simmonds?  Thanks again,  Colletta Maryland, RN-triage  ----- Message -----  From: Kennedy Bucker, LPN  Sent: 80/04/8109 11:47 AM EDT  To: Georgia Lopes, RN   Per Dr Alen Blew he will arrange for the patient to have labs drawn and a follow up appointment sooner. Thank you   Kim LPN     Routing to Dr Quincy Simmonds for update. Please advise if any further recommendations.

## 2020-07-26 ENCOUNTER — Ambulatory Visit: Payer: 59 | Admitting: Obstetrics and Gynecology

## 2020-07-27 ENCOUNTER — Inpatient Hospital Stay: Payer: No Typology Code available for payment source | Attending: Oncology

## 2020-07-27 ENCOUNTER — Other Ambulatory Visit: Payer: Self-pay

## 2020-07-27 DIAGNOSIS — M069 Rheumatoid arthritis, unspecified: Secondary | ICD-10-CM | POA: Insufficient documentation

## 2020-07-27 DIAGNOSIS — E785 Hyperlipidemia, unspecified: Secondary | ICD-10-CM

## 2020-07-27 DIAGNOSIS — Z803 Family history of malignant neoplasm of breast: Secondary | ICD-10-CM | POA: Diagnosis not present

## 2020-07-27 DIAGNOSIS — R233 Spontaneous ecchymoses: Secondary | ICD-10-CM | POA: Diagnosis present

## 2020-07-27 DIAGNOSIS — R58 Hemorrhage, not elsewhere classified: Secondary | ICD-10-CM

## 2020-07-27 LAB — HEPATIC FUNCTION PANEL
ALT: 24 U/L (ref 0–44)
AST: 34 U/L (ref 15–41)
Albumin: 4 g/dL (ref 3.5–5.0)
Alkaline Phosphatase: 54 U/L (ref 38–126)
Bilirubin, Direct: <0.1 mg/dL (ref 0.0–0.2)
Total Bilirubin: 0.8 mg/dL (ref 0.3–1.2)
Total Protein: 7.2 g/dL (ref 6.5–8.1)

## 2020-07-27 LAB — CBC WITH DIFFERENTIAL (CANCER CENTER ONLY)
Abs Immature Granulocytes: 0.03 10*3/uL (ref 0.00–0.07)
Basophils Absolute: 0.1 10*3/uL (ref 0.0–0.1)
Basophils Relative: 1 %
Eosinophils Absolute: 0.2 10*3/uL (ref 0.0–0.5)
Eosinophils Relative: 3 %
HCT: 38 % (ref 36.0–46.0)
Hemoglobin: 12.8 g/dL (ref 12.0–15.0)
Immature Granulocytes: 1 %
Lymphocytes Relative: 28 %
Lymphs Abs: 1.8 10*3/uL (ref 0.7–4.0)
MCH: 28.9 pg (ref 26.0–34.0)
MCHC: 33.7 g/dL (ref 30.0–36.0)
MCV: 85.8 fL (ref 80.0–100.0)
Monocytes Absolute: 0.5 10*3/uL (ref 0.1–1.0)
Monocytes Relative: 7 %
Neutro Abs: 4.1 10*3/uL (ref 1.7–7.7)
Neutrophils Relative %: 60 %
Platelet Count: 298 10*3/uL (ref 150–400)
RBC: 4.43 MIL/uL (ref 3.87–5.11)
RDW: 12.6 % (ref 11.5–15.5)
WBC Count: 6.6 10*3/uL (ref 4.0–10.5)
nRBC: 0 % (ref 0.0–0.2)

## 2020-07-27 LAB — PROTIME-INR
INR: 1 (ref 0.8–1.2)
Prothrombin Time: 12.3 seconds (ref 11.4–15.2)

## 2020-07-27 LAB — APTT: aPTT: 35 seconds (ref 24–36)

## 2020-07-29 LAB — VON WILLEBRAND PANEL
Coagulation Factor VIII: 151 % — ABNORMAL HIGH (ref 56–140)
Ristocetin Co-factor, Plasma: 118 % (ref 50–200)
Von Willebrand Antigen, Plasma: 151 % (ref 50–200)

## 2020-07-29 LAB — COAG STUDIES INTERP REPORT

## 2020-08-02 NOTE — Progress Notes (Signed)
GYNECOLOGY  VISIT   HPI: 54 y.o.   Married  Caucasian  female   [phone] with No LMP recorded (lmp unknown). Patient has had a hysterectomy.   here for 6 weeks status post  ANTERIOR (CYSTOCELE) AND POSTERIOR REPAIR (RECTOCELE) (N/A Vagina ) TRANSVAGINAL TAPE (TVT) PROCEDURE/EXACT MIDURETHRAL SLING (N/A Vagina ) CYSTOSCOPY (N/A Bladder).   Feels like her weight is going up.   Voiding well and having BMs.   No more discharge, just occasional passed suture.   Father in law has just moved in.  He has dementia.  Has been taking Effexor 37.5 mg daily for hot flashes, which are resolved.  Decreased sexual response.   Had blood work with hematology.  Results pending.   Will see urology in December.   GYNECOLOGIC HISTORY: No LMP recorded (lmp unknown). Patient has had a hysterectomy. Contraception: Hyst--ovaries remain Menopausal hormone therapy:  n/a Last mammogram: 11-21-19 3D/Neg/density B/BiRads1 Last pap smear: 10-16-19 Neg:Neg HR HPV,2019 normal per patient        OB History    Gravida  5   Para  3   Term      Preterm      AB  2   Living  3     SAB  2   TAB      Ectopic      Multiple      Live Births                 Patient Active Problem List   Diagnosis Date Noted  . Status post surgery 06/14/2020  . Genetic testing 02/18/2020  . Family history of breast cancer   . High risk medication use 10/30/2019  . Left bundle branch block 04/28/2019  . Right wrist pain 03/13/2019  . Hamstring strain, left, initial encounter 02/20/2019  . 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 Primary osteoarthritis of both feet 10/09/2018  . Hx of migraines 09/27/2018  . History of gastroesophageal reflux (GERD) 09/27/2018  . 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 Moderate persistent asthma 07/05/2018  . Hypothyroid 06/28/2018    Past Medical History:  Diagnosis Date  . Arthritis    ra zero negative  . Asthma    mild/moderate persistent asthma   . Complication of anesthesia    slow to awaken 25 yrs ago  . Elevated liver function tests dr Neil Crouch pcp manages   for last year  . Family history of breast cancer   . Fibroid   . Frequent headaches   . GERD (gastroesophageal reflux disease)   . Gluten intolerance   . H/O seasonal allergies   . History of chicken pox   . History of COVID-19 07/2019   high fever sob, x 7 days all symptoms resolved  . Left bundle branch block 02/2019   Dr.Paula Ross  . Migraines   . PONV (postoperative nausea and vomiting)    ponv with several surgeries, likes scopolamine patch  . Thyroid disease    Multiple benign nodules--thyroid removed    Past Surgical History:  Procedure Laterality Date  . ANTERIOR AND POSTERIOR REPAIR N/A 06/14/2020   Procedure: ANTERIOR (CYSTOCELE) AND POSTERIOR REPAIR (RECTOCELE);  Surgeon: Nunzio Cobbs, MD;  [facility];  Service: Gynecology;  Laterality: N/A;  . BLADDER SUSPENSION N/A 06/14/2020   Procedure: TRANSVAGINAL TAPE (TVT) PROCEDURE/EXACT MIDURETHRAL SLING;  Surgeon: Nunzio Cobbs, MD;  Location: [facility];  Service: Gynecology;  Laterality: N/A;  EXACT MIDURETHRAL SLING  . CARDIAC CATHETERIZATION  2009   results normal done in [place]  . CHOLECYSTECTOMY  1995   laparoscopic  . CYSTOSCOPY N/A 06/14/2020   Procedure: CYSTOSCOPY;  Surgeon: Nunzio Cobbs, MD;  Location: [facility];  Service: Gynecology;  Laterality: N/A;  . HAMMER TOE SURGERY Bilateral 2009  . HERNIA REPAIR  2015   Has abdominal wall mesh - [place], [place].  Laparoscopic incisional hernia repari with 7 x 9 inch Ventralight ST with Echo mesh  . LAPAROSCOPIC HYSTERECTOMY  2007   Supracervical hysterectomy with cystosctopy - [place], [place] partial  . THYROID SURGERY  2013   total thyroidectomy due to 11 nodules    Current Outpatient Medications  Medication Sig Dispense Refill  . acetaminophen  (TYLENOL) 500 MG tablet Take 500 mg by mouth every 6 (six) hours as needed.    Marland Kitchen ADVAIR DISKUS 250-50 MCG/DOSE AEPB INHALE 1 PUFF BY MOUTH TWICE A DAY 180 each 3  . albuterol (VENTOLIN HFA) 108 (90 Base) MCG/ACT inhaler TAKE 2 PUFFS BY MOUTH EVERY 6 HOURS AS NEEDED FOR WHEEZE OR SHORTNESS OF BREATH 25.5 g 5  . cholecalciferol (VITAMIN D) 400 units TABS tablet Take 5,000 Units by mouth.     . cyclobenzaprine (FLEXERIL) 10 MG tablet Take 1 tablet (10 mg total) by mouth 3 (three) times daily as needed for muscle spasms. 30 tablet 0  . Diclofenac Sodium ([name]) 2 % SOLN Place 1 application onto the skin 2 (two) times daily. (Patient taking differently: Place 1 application onto the skin daily as needed. ) 112 g 2  . fexofenadine-pseudoephedrine (ALLEGRA-D 24) 180-240 MG 24 hr tablet One tablet as needed daily for allergies  (Patient taking differently: daily. ) 30 tablet 5  . gabapentin (NEURONTIN) 800 MG tablet Take 1 tablet (800 mg total) by mouth at bedtime. 90 tablet 1  . hydroxychloroquine (PLAQUENIL) 200 MG tablet TAKE 1 TABLET BY MOUTH TWICE A DAY 180 tablet 0  . ibuprofen (ADVIL) 800 MG tablet Take 1 tablet (800 mg total) by mouth every 8 (eight) hours as needed. 30 tablet 0  . levothyroxine (SYNTHROID) 150 MCG tablet TAKE 1 TABLET BY MOUTH EVERY OTHER DAY 45 tablet 3  . montelukast (SINGULAIR) 10 MG tablet TAKE 1 TABLET BY MOUTH EVERYDAY AT BEDTIME 90 tablet 3  . SYNTHROID 175 MCG tablet Take 175 mcg by mouth every other day.     . venlafaxine XR (EFFEXOR XR) 37.5 MG 24 hr capsule Take 1 capsule (37.5 mg total) by mouth daily. 30 capsule 2  . rosuvastatin (CRESTOR) 5 MG tablet Take 0.5 tablets (2.5 mg total) by mouth daily. 90 tablet 3   No current facility-administered medications for this visit.     ALLERGIES: Orencia [abatacept], Shellfish allergy, Strawberry (diagnostic), Gluten meal, Lortab [hydrocodone-acetaminophen], Morphine and related, and Augmentin [amoxicillin-pot  clavulanate]  Family History  Problem Relation Age of Onset  . Arthritis Mother   . Cancer Mother 51       breast ca--dec age 20  . Miscarriages / [place] Mother   . Breast cancer Mother 76  . Diabetes Father   . Heart attack Father   . Heart disease Father        pacemaker   . Hyperlipidemia Father   . Hypertension Father   . Stroke Father        x4  . COPD Sister   . Peripheral Artery Disease Sister   . Asthma Brother   .  Birth defects Brother   . Cancer Brother        skin  . Hyperlipidemia Brother   . Heart attack Maternal Grandmother   . Heart disease Maternal Grandmother   . Hyperlipidemia Maternal Grandmother   . Stroke Maternal Grandmother   . Alcohol abuse Paternal Grandmother   . Cancer Paternal Grandmother        colon  . Cancer Paternal Grandfather        breast  . Heart attack Paternal Grandfather   . Breast cancer Paternal Grandfather 46  . Alcohol abuse Brother   . Early death Brother        suicide  . Mental illness Brother   . Asthma Daughter   . Asthma Son   . Asthma Son   . Breast cancer Maternal Aunt 60  . Kidney cancer Paternal Aunt 85  . Breast cancer Paternal Aunt 27  . Throat cancer Paternal Uncle   . Thyroid cancer Paternal Uncle   . Breast cancer Cousin 66  . Prostate cancer Cousin     Social History   Socioeconomic History  . Marital status: Married    Spouse name: Not on file  . Number of children: Not on file  . Years of education: Not on file  . Highest education level: Not on file  Occupational History  . Not on file  Tobacco Use  . Smoking status: Never Smoker  . Smokeless tobacco: Never Used  Vaping Use  . Vaping Use: Never used  Substance and Sexual Activity  . Alcohol use: Yes    Comment: occ  . Drug use: Never  . Sexual activity: Yes    Partners: Male    Birth control/protection: Surgical    Comment: Hyst-ovaries remain  Other Topics Concern  . Not on file  Social History Narrative  . Not on file    Social Determinants of Health   Financial Resource Strain:   . Difficulty of Paying Living Expenses: Not on file  Food Insecurity:   . Worried About Charity fundraiser in the Last Year: Not on file  . Ran Out of Food in the Last Year: Not on file  Transportation Needs:   . Lack of Transportation (Medical): Not on file  . Lack of Transportation (Non-Medical): Not on file  Physical Activity:   . Days of Exercise per Week: Not on file  . Minutes of Exercise per Session: Not on file  Stress:   . Feeling of Stress : Not on file  Social Connections:   . Frequency of Communication with Friends and Family: Not on file  . Frequency of Social Gatherings with Friends and Family: Not on file  . Attends Religious Services: Not on file  . Active Member of Clubs or Organizations: Not on file  . Attends Archivist Meetings: Not on file  . Marital Status: Not on file  Intimate Partner Violence:   . Fear of Current or Ex-Partner: Not on file  . Emotionally Abused: Not on file  . Physically Abused: Not on file  . Sexually Abused: Not on file    Review of Systems  All other systems reviewed and are negative.   PHYSICAL EXAMINATION:    BP 128/62 (Cuff Size: Large)   Pulse 84   Ht 5' 5.25" (1.657 m)   Wt 190 lb (86.2 kg)   LMP  (LMP Unknown)   SpO2 99%   BMI 31.38 kg/m     General appearance: alert, cooperative  and appears stated age    Pelvic: External genitalia:  no lesions              Urethra:  normal appearing urethra with no masses, tenderness or lesions              Bartholins and Skenes: normal                 Vagina: normal appearing vagina with normal color and discharge, sling protected.  Small area of granulation tissue of the posterior vagina, tx with AgNO3.  Sutures dissolved.  Small subtle hematoma  Of the posterior vaginal wall.               Cervix: no lesions                Bimanual Exam:  Uterus:  Absent.                Adnexa: no mass, fullness,  tenderness         Chaperone was present for exam.  ASSESSMENT  Doing well post op.   PLAN  OK to restart Rinvoq.   FU for annual exam end of January, 2022, sooner as needed.

## 2020-08-03 ENCOUNTER — Ambulatory Visit (INDEPENDENT_AMBULATORY_CARE_PROVIDER_SITE_OTHER): Payer: No Typology Code available for payment source | Admitting: Obstetrics and Gynecology

## 2020-08-03 ENCOUNTER — Other Ambulatory Visit: Payer: Self-pay | Admitting: Internal Medicine

## 2020-08-03 ENCOUNTER — Encounter: Payer: Self-pay | Admitting: Obstetrics and Gynecology

## 2020-08-03 ENCOUNTER — Other Ambulatory Visit: Payer: Self-pay

## 2020-08-03 VITALS — BP 128/62 | HR 84 | Ht 65.25 in | Wt 190.0 lb

## 2020-08-03 DIAGNOSIS — Z9889 Other specified postprocedural states: Secondary | ICD-10-CM

## 2020-08-09 ENCOUNTER — Other Ambulatory Visit: Payer: Self-pay

## 2020-08-09 ENCOUNTER — Other Ambulatory Visit: Payer: Self-pay | Admitting: Obstetrics and Gynecology

## 2020-08-09 ENCOUNTER — Encounter: Payer: Self-pay | Admitting: Obstetrics and Gynecology

## 2020-08-09 ENCOUNTER — Inpatient Hospital Stay (HOSPITAL_BASED_OUTPATIENT_CLINIC_OR_DEPARTMENT_OTHER): Payer: No Typology Code available for payment source | Admitting: Oncology

## 2020-08-09 VITALS — BP 144/99 | HR 93 | Temp 97.8°F | Resp 20 | Ht 65.25 in | Wt 192.5 lb

## 2020-08-09 DIAGNOSIS — R233 Spontaneous ecchymoses: Secondary | ICD-10-CM | POA: Diagnosis not present

## 2020-08-09 DIAGNOSIS — R58 Hemorrhage, not elsewhere classified: Secondary | ICD-10-CM

## 2020-08-09 DIAGNOSIS — F419 Anxiety disorder, unspecified: Secondary | ICD-10-CM

## 2020-08-09 MED ORDER — VENLAFAXINE HCL ER 37.5 MG PO CP24: 37.5000 mg | ORAL_CAPSULE | Freq: Every day | ORAL | 2 refills | Status: DC

## 2020-08-09 NOTE — Telephone Encounter (Signed)
Medication refill request: Venlafaxine  Last AEX:  10/16/19 Next AEX: 10/18/20 Last MMG (if hormonal medication request): NA  Refill authorized: 30/2    Request is a few days early due to the holidays

## 2020-08-09 NOTE — Progress Notes (Signed)
Hematology and Oncology Follow Up Visit  [name] [id number] 17-Dec-1965 54 y.o. 08/09/2020 9:19 AM Orma Flaming, MDWolfe, Ebony Hail, MD   Principle Diagnosis: 54 year old woman with the ecchymosis and questionable bleeding disorder.  She developed postoperative hematoma and vaginal bleeding in September 2021.   Current therapy: No active therapy noted.  Interim History: [name] returns today for a follow-up visit.  Since the last visit she underwent surgical procedure to correct her cystocele and incontinence on June 14, 2020.  She underwent anterior and posterior colporrhaphy with a sling that was completed initially without complications during the procedure.  Postoperatively, he developed ecchymosis lower abdomen and pelvic wall.  She also developed vaginal bleeding and developed anterior hematoma.  She required multiple examination afterwards and subsequently her bleeding stopped and that hematoma has resolved.  Clinically, she feels well at this time without any pelvic pain or discomfort.  She denies any active bleeding.  She continues to have spontaneous ecchymosis on her lower extremities.     Medications: I have reviewed the patient's current medications.  Current Outpatient Medications  Medication Sig Dispense Refill  . acetaminophen (TYLENOL) 500 MG tablet Take 500 mg by mouth every 6 (six) hours as needed.    Marland Kitchen ADVAIR DISKUS 250-50 MCG/DOSE AEPB INHALE 1 PUFF BY MOUTH TWICE A DAY 180 each 3  . albuterol (VENTOLIN HFA) 108 (90 Base) MCG/ACT inhaler TAKE 2 PUFFS BY MOUTH EVERY 6 HOURS AS NEEDED FOR WHEEZE OR SHORTNESS OF BREATH 25.5 g 5  . cholecalciferol (VITAMIN D) 400 units TABS tablet Take 5,000 Units by mouth.     . cyclobenzaprine (FLEXERIL) 10 MG tablet Take 1 tablet (10 mg total) by mouth 3 (three) times daily as needed for muscle spasms. 30 tablet 0  . Diclofenac Sodium ([name]) 2 % SOLN Place 1 application onto the skin 2 (two) times daily. (Patient taking  differently: Place 1 application onto the skin daily as needed. ) 112 g 2  . fexofenadine-pseudoephedrine (ALLEGRA-D 24) 180-240 MG 24 hr tablet One tablet as needed daily for allergies  (Patient taking differently: daily. ) 30 tablet 5  . gabapentin (NEURONTIN) 800 MG tablet Take 1 tablet (800 mg total) by mouth at bedtime. 90 tablet 1  . hydroxychloroquine (PLAQUENIL) 200 MG tablet TAKE 1 TABLET BY MOUTH TWICE A DAY 180 tablet 0  . ibuprofen (ADVIL) 800 MG tablet Take 1 tablet (800 mg total) by mouth every 8 (eight) hours as needed. 30 tablet 0  . levothyroxine (SYNTHROID) 150 MCG tablet TAKE 1 TABLET BY MOUTH EVERY OTHER DAY 45 tablet 3  . montelukast (SINGULAIR) 10 MG tablet TAKE 1 TABLET BY MOUTH EVERYDAY AT BEDTIME 90 tablet 3  . rosuvastatin (CRESTOR) 5 MG tablet TAKE 1/2 TABLET BY MOUTH DAILY 45 tablet 3  . SYNTHROID 175 MCG tablet Take 175 mcg by mouth every other day.     . venlafaxine XR (EFFEXOR XR) 37.5 MG 24 hr capsule Take 1 capsule (37.5 mg total) by mouth daily. 30 capsule 2   No current facility-administered medications for this visit.     Allergies:  Allergies  Allergen Reactions  . Orencia [Abatacept] Hives  . Shellfish Allergy Anaphylaxis and Hives  . Strawberry (Diagnostic) Anaphylaxis    Hazelnuts hives asthma, okra hives asthma Strawberry throat closes  . Gluten Meal     Bloody diarrhea and hives  . Lortab [Hydrocodone-Acetaminophen]     Swelling of tongue  . Morphine And Related Itching    irritable  . Augmentin [  Amoxicillin-Pot Clavulanate] Rash      Physical Exam:  ECOG:    General appearance: Comfortable appearing without any discomfort Head: Normocephalic without any trauma Oropharynx: Mucous membranes are moist and pink without any thrush or ulcers. Eyes: Pupils are equal and round reactive to light. Lymph nodes: No cervical, supraclavicular, inguinal or axillary lymphadenopathy.   Heart:regular rate and rhythm.  S1 and S2 without leg  edema. Lung: Clear without any rhonchi or wheezes.  No dullness to percussion. Abdomin: Soft, nontender, nondistended with good bowel sounds.  No hepatosplenomegaly. Musculoskeletal: No joint deformity or effusion.  Full range of motion noted. Neurological: No deficits noted on motor, sensory and deep tendon reflex exam. Skin: No petechial rash or dryness.  Appeared moist.      Lab Results: Lab Results  Component Value Date   WBC 6.6 07/27/2020   HGB 12.8 07/27/2020   HCT 38.0 07/27/2020   MCV 85.8 07/27/2020   PLT 298 07/27/2020     Chemistry      Component Value Date/Time   NA 140 06/14/2020 0641   NA 137 07/25/2016 0000   K 3.9 06/14/2020 0641   CL 101 06/14/2020 0641   CO2 28 06/14/2020 0625   BUN 6 06/14/2020 0641   CREATININE 0.80 06/14/2020 0641   CREATININE 0.87 05/14/2020 1358   GLU 84 07/25/2016 0000      Component Value Date/Time   CALCIUM 10.0 06/14/2020 0625   ALKPHOS 54 07/27/2020 1015   AST 34 07/27/2020 1015   ALT 24 07/27/2020 1015   BILITOT 0.8 07/27/2020 1015   BILITOT 0.6 07/28/2019 0847         Impression and Plan:  54 year old woman with:  1.  Spontaneous ecchymosis and postoperative bleeding with vaginal hematoma noted in September 2021.  Laboratory testing obtained on July 27, 2020 were reviewed and discussed with patient today.  She has normal platelet count, PT, PTT as well as von Willebrand's panel including elevated factor VIII.  The differential diagnosis was reviewed today in detail.  She reports spontaneous ecchymosis for the last 6 months and recent postoperative bleeding and vaginal hematoma.  She has had multiple surgeries and pregnancies in the past without any complications.  Laboratory data do not show any evidence of inherited bleeding disorder.  Acquired the bleeding disorder is the likely etiology at this time possibly due to medication.  She has been on high doses of ibuprofen although she reports she has not been taking  it regularly.  She has been on Plaquenil and Rinvoq which has helped with her arthritis and eliminated the need for nonsteroidal anti-inflammatories.  She reports that her last Rinvoq injection was 2 weeks prior to her surgery.  I see no correlation between this medication and bleeding disorders.  Thrombosis and infection has been attributed but no bleeding.   At this point, it is unclear what is causing her ecchymosis and her recent postoperative bleeding.  Her laboratory parameters do not indicate any clear factor deficiency.  Medications such her Plaquenil and Rinvoq are not known to cause bleeding disorders.  I recommended continued active surveillance at this time and monitor her clinically.  We will repeat evaluation in 4 months.  I do not recommend any prophylaxis before any surgery.  She has no procedures planned at this time.  She has had dental cleaning without any complications.   2.  Follow-up: In 4 months for repeat follow-up.  30  minutes were spent on this encounter.  The time was  dedicated to reviewing laboratory data, discussing differential diagnosis and management options.    Zola Button, MD 11/22/20219:19 AM

## 2020-08-10 ENCOUNTER — Telehealth: Payer: Self-pay | Admitting: Oncology

## 2020-08-10 ENCOUNTER — Encounter: Payer: Self-pay | Admitting: Family Medicine

## 2020-08-10 MED ORDER — DIPHENHYDRAMINE HCL 25 MG PO CAPS
ORAL_CAPSULE | ORAL | Status: AC
  Filled 2020-08-10: qty 1

## 2020-08-10 MED ORDER — ACETAMINOPHEN 325 MG PO TABS
ORAL_TABLET | ORAL | Status: AC
  Filled 2020-08-10: qty 2

## 2020-08-10 NOTE — Telephone Encounter (Signed)
Scheduled per 11/22 los. Called and spoke with pt, confirmed 3/22 appt

## 2020-09-05 ENCOUNTER — Other Ambulatory Visit: Payer: Self-pay | Admitting: Family Medicine

## 2020-09-05 ENCOUNTER — Other Ambulatory Visit: Payer: Self-pay | Admitting: Rheumatology

## 2020-09-05 DIAGNOSIS — M1230 Palindromic rheumatism, unspecified site: Secondary | ICD-10-CM

## 2020-09-06 ENCOUNTER — Encounter: Payer: Self-pay | Admitting: Neurology

## 2020-09-06 ENCOUNTER — Other Ambulatory Visit (INDEPENDENT_AMBULATORY_CARE_PROVIDER_SITE_OTHER): Payer: No Typology Code available for payment source

## 2020-09-06 ENCOUNTER — Other Ambulatory Visit: Payer: Self-pay

## 2020-09-06 ENCOUNTER — Ambulatory Visit (INDEPENDENT_AMBULATORY_CARE_PROVIDER_SITE_OTHER): Payer: No Typology Code available for payment source | Admitting: Neurology

## 2020-09-06 VITALS — BP 130/81 | HR 94 | Ht 67.0 in | Wt 195.0 lb

## 2020-09-06 DIAGNOSIS — R292 Abnormal reflex: Secondary | ICD-10-CM

## 2020-09-06 DIAGNOSIS — G2581 Restless legs syndrome: Secondary | ICD-10-CM

## 2020-09-06 DIAGNOSIS — R202 Paresthesia of skin: Secondary | ICD-10-CM

## 2020-09-06 LAB — B12 AND FOLATE PANEL
Folate: 6.6 ng/mL (ref >5.9)
Vitamin B-12: 609 pg/mL (ref 211–911)

## 2020-09-06 LAB — FERRITIN: Ferritin: 26.9 ng/mL (ref 10.0–291.0)

## 2020-09-06 NOTE — Progress Notes (Signed)
[facility] Clinic Note - Initial Visit   Date: 09/06/20  [name] MRN: [id number] DOB: [birth year]   Dear Dr. Rogers Blocker:  Thank you for your kind referral of [name] for consultation of paresthesias. Although her history is well known to you, please allow [place] to reiterate it for the purpose of our medical record. The patient was accompanied to the clinic by self.    History of Present Illness: [name] is a 54 y.o. right-handed female with seronegative rheumatoid arthritis, GERD, migraines presenting for evaluation of tingling. Over the past year, she has noticed numbness of the legs, which is very sporadic and usually occurs with walking.  It occurs about 2-3 times per week, lasting anywhere from several minutes to all day.  No alleviating factors.  Other times, she feels tingling in the wrists.  There is no weakness, but if she is exercising and using the treadmill and her leg goes numb, she often stops exercising. She has not suffered any falls.  She also complains of restless leg syndrome and often needs to move her legs at nighttime to get relief.  She takes gabapentin 600mg  at bedtime for hot flashes.   Out-side paper records, electronic medical record, and images have been reviewed where available and summarized as:  Lab Results  Component Value Date   HGBA1C 5.0 11/28/2019   No results found for: [id number] Lab Results  Component Value Date   TSH 0.77 05/14/2020   Lab Results  Component Value Date   ESRSEDRATE 6 09/27/2018    Past Medical History:  Diagnosis Date  . Arthritis    ra zero negative  . Asthma    mild/moderate persistent asthma  . Complication of anesthesia    slow to awaken 25 yrs ago  . Elevated liver function tests dr Neil Crouch pcp manages   for last year  . Family history of breast cancer   . Fibroid   . Frequent headaches   . GERD (gastroesophageal reflux disease)   . Gluten intolerance   . H/O  seasonal allergies   . History of chicken pox   . History of COVID-19 07/2019   high fever sob, x 7 days all symptoms resolved  . Left bundle branch block 02/2019   Dr.Paula Ross  . Migraines   . PONV (postoperative nausea and vomiting)    ponv with several surgeries, likes scopolamine patch  . Thyroid disease    Multiple benign nodules--thyroid removed    Past Surgical History:  Procedure Laterality Date  . ANTERIOR AND POSTERIOR REPAIR N/A 06/14/2020   Procedure: ANTERIOR (CYSTOCELE) AND POSTERIOR REPAIR (RECTOCELE);  Surgeon: Nunzio Cobbs, MD;  Location: [facility];  Service: Gynecology;  Laterality: N/A;  . BLADDER SUSPENSION N/A 06/14/2020   Procedure: TRANSVAGINAL TAPE (TVT) PROCEDURE/EXACT MIDURETHRAL SLING;  Surgeon: Nunzio Cobbs, MD;  Location: [facility];  Service: Gynecology;  Laterality: N/A;  EXACT MIDURETHRAL SLING  . CARDIAC CATHETERIZATION  2009   results normal done in [place]  . CHOLECYSTECTOMY  1995   laparoscopic  . CYSTOSCOPY N/A 06/14/2020   Procedure: CYSTOSCOPY;  Surgeon: Nunzio Cobbs, MD;  Location: [facility];  Service: Gynecology;  Laterality: N/A;  . HAMMER TOE SURGERY Bilateral 2009  . HERNIA REPAIR  2015   Has abdominal wall mesh - [place], [place].  Laparoscopic incisional hernia repari with 7 x 9 inch Ventralight ST with Echo mesh  .  LAPAROSCOPIC HYSTERECTOMY  2007   Supracervical hysterectomy with cystosctopy - [place], [place] partial  . THYROID SURGERY  2013   total thyroidectomy due to 11 nodules     Medications:  Outpatient Encounter Medications as of 09/06/2020  Medication Sig  . acetaminophen (TYLENOL) 500 MG tablet Take 500 mg by mouth every 6 (six) hours as needed.  Marland Kitchen ADVAIR DISKUS 250-50 MCG/DOSE AEPB INHALE 1 PUFF BY MOUTH TWICE A DAY  . albuterol (VENTOLIN HFA) 108 (90 Base) MCG/ACT inhaler TAKE 2 PUFFS BY MOUTH EVERY 6 HOURS AS NEEDED FOR WHEEZE OR  SHORTNESS OF BREATH  . cholecalciferol (VITAMIN D) 400 units TABS tablet Take 5,000 Units by mouth.   . cyclobenzaprine (FLEXERIL) 10 MG tablet Take 1 tablet (10 mg total) by mouth 3 (three) times daily as needed for muscle spasms.  . Diclofenac Sodium ([name]) 2 % SOLN Place 1 application onto the skin 2 (two) times daily. (Patient taking differently: Place 1 application onto the skin daily as needed.)  . fexofenadine-pseudoephedrine (ALLEGRA-D 24) 180-240 MG 24 hr tablet One tablet as needed daily for allergies  (Patient taking differently: daily.)  . gabapentin (NEURONTIN) 800 MG tablet Take 1 tablet (800 mg total) by mouth at bedtime.  . hydroxychloroquine (PLAQUENIL) 200 MG tablet TAKE 1 TABLET BY MOUTH TWICE A DAY  . levothyroxine (SYNTHROID) 150 MCG tablet TAKE 1 TABLET BY MOUTH EVERY OTHER DAY  . montelukast (SINGULAIR) 10 MG tablet TAKE 1 TABLET BY MOUTH EVERYDAY AT BEDTIME  . rosuvastatin (CRESTOR) 5 MG tablet TAKE 1/2 TABLET BY MOUTH DAILY  . SYNTHROID 175 MCG tablet Take 175 mcg by mouth every other day.   . venlafaxine XR (EFFEXOR XR) 37.5 MG 24 hr capsule Take 1 capsule (37.5 mg total) by mouth daily.  Marland Kitchen ibuprofen (ADVIL) 800 MG tablet Take 1 tablet (800 mg total) by mouth every 8 (eight) hours as needed.   No facility-administered encounter medications on file as of 09/06/2020.    Allergies:  Allergies  Allergen Reactions  . Orencia [Abatacept] Hives  . Shellfish Allergy Anaphylaxis and Hives  . Strawberry (Diagnostic) Anaphylaxis    Hazelnuts hives asthma, okra hives asthma Strawberry throat closes  . Gluten Meal     Bloody diarrhea and hives  . Lortab [Hydrocodone-Acetaminophen]     Swelling of tongue  . Morphine And Related Itching    irritable  . Augmentin [Amoxicillin-Pot Clavulanate] Rash    Family History: Family History  Problem Relation Age of Onset  . Arthritis Mother   . Cancer Mother 63       breast ca--dec age 66  . Miscarriages / [place]  Mother   . Breast cancer Mother 57  . Diabetes Father   . Heart attack Father   . Heart disease Father        pacemaker   . Hyperlipidemia Father   . Hypertension Father   . Stroke Father        x4  . COPD Sister   . Peripheral Artery Disease Sister   . Asthma Brother   . Birth defects Brother   . Cancer Brother        skin  . Hyperlipidemia Brother   . Heart attack Maternal Grandmother   . Heart disease Maternal Grandmother   . Hyperlipidemia Maternal Grandmother   . Stroke Maternal Grandmother   . Alcohol abuse Paternal Grandmother   . Cancer Paternal Grandmother        colon  . Cancer Paternal Grandfather  breast  . Heart attack Paternal Grandfather   . Breast cancer Paternal Grandfather 25  . Alcohol abuse Brother   . Early death Brother        suicide  . Mental illness Brother   . Asthma Daughter   . Asthma Son   . Asthma Son   . Breast cancer Maternal Aunt 60  . Kidney cancer Paternal Aunt 85  . Breast cancer Paternal Aunt 36  . Throat cancer Paternal Uncle   . Thyroid cancer Paternal Uncle   . Breast cancer Cousin 44  . Prostate cancer Cousin     Social History: Social History   Tobacco Use  . Smoking status: Never Smoker  . Smokeless tobacco: Never Used  Vaping Use  . Vaping Use: Never used  Substance Use Topics  . Alcohol use: Yes    Comment: occ  . Drug use: Never   Social History   Social History Narrative   Right Handed   Lives in a two story home    Vital Signs:  BP 130/81   Pulse 94   Ht 5\' 7"  (1.702 m)   Wt 195 lb (88.5 kg)   LMP  (LMP Unknown)   SpO2 100%   BMI 30.54 kg/m   Neurological Exam: MENTAL STATUS including orientation to time, place, person, recent and remote memory, attention span and concentration, language, and fund of knowledge is normal.  Speech is not dysarthric.  CRANIAL NERVES: II:  No visual field defects. III-IV-VI: Pupils equal round and reactive to light.  Normal conjugate, extra-ocular eye  movements in all directions of gaze.  No nystagmus.  No ptosis.   V:  Normal facial sensation.    VII:  Normal facial symmetry and movements.   VIII:  Normal hearing and vestibular function.   IX-X:  Normal palatal movement.   XI:  Normal shoulder shrug and head rotation.   XII:  Normal tongue strength and range of motion, no deviation or fasciculation.  MOTOR:  No atrophy, fasciculations or abnormal movements.  No pronator drift.   Upper Extremity:  Right  Left  Deltoid  5/5   5/5   Biceps  5/5   5/5   Triceps  5/5   5/5   Infraspinatus 5/5  5/5  Medial pectoralis 5/5  5/5  Wrist extensors  5/5   5/5   Wrist flexors  5/5   5/5   Finger extensors  5/5   5/5   Finger flexors  5/5   5/5   Dorsal interossei  5/5   5/5   Abductor pollicis  5/5   5/5   Tone (Ashworth scale)  0  0   Lower Extremity:  Right  Left  Hip flexors  5/5   5/5   Hip extensors  5/5   5/5   Adductor 5/5  5/5  Abductor 5/5  5/5  Knee flexors  5/5   5/5   Knee extensors  5/5   5/5   Dorsiflexors  5/5   5/5   Plantarflexors  5/5   5/5   Toe extensors  5/5   5/5   Toe flexors  5/5   5/5   Tone (Ashworth scale)  0  0   MSRs:  Right        Left                  brachioradialis 2+  2+  biceps 2+  2+  triceps 2+  2+  patellar  2++  2++  ankle jerk 2+  2+  Hoffman no  no  plantar response down  down   SENSORY:  Normal and symmetric perception of light touch, pinprick, vibration, and proprioception.  Romberg's sign absent.   COORDINATION/GAIT: Normal finger-to- nose-finger and heel-to-shin.  Intact rapid alternating movements bilaterally.  Able to rise from a chair without using arms.  Gait narrow based and stable. Tandem and stressed gait intact.    IMPRESSION: 1.  Paresthesias involving the legs >> hands.  Symptoms do not following a dermatomal or cutaneous nerve distribution and her exam is normal, making it difficult to localize symptoms.   I will check MRI brain wwo contrast to exclude demyelinating  disease. If normal, she will return for NCS/EMG of the right arm and leg Check vitamin B12, folate  2. Restless leg syndrome Check ferritin She is on gabapentin 600mg  at bedtime  Further recommendations pending results.   Thank you for allowing me to participate in patient's care.  If I can answer any additional questions, I would be pleased to do so.    Sincerely,    Keath Matera K. Posey Pronto, DO

## 2020-09-06 NOTE — Patient Instructions (Addendum)
Check labs  MRI brain wwo contrast   Nerve testing of the right arm and leg.  Do not apply cream or lotion to your skin on the day of testing

## 2020-09-07 ENCOUNTER — Telehealth: Payer: Self-pay

## 2020-09-07 NOTE — Telephone Encounter (Signed)
Called patient and was unable to leave a message. Phone did not ring.

## 2020-09-07 NOTE — Telephone Encounter (Signed)
Called patient and informed her of results and informed her of recommendations. Patient had no questions or concerns.

## 2020-09-07 NOTE — Telephone Encounter (Signed)
-----   Message from Donika K Patel, DO sent at 09/06/2020  2:44 PM EST ----- Please inform patient that her ferritin level is low-normal.  With restless leg syndrome we like it > 50 and hers is 26. Start ferrous sulfate 325mg daily, please ask her to drink plenty of water and add fiber to diet since iron may cause some constipation and dark discoloration of stool.  Thanks.  

## 2020-09-07 NOTE — Telephone Encounter (Signed)
-----   Message from Alda Berthold, DO sent at 09/06/2020  2:44 PM EST ----- Please inform patient that her ferritin level is low-normal.  With restless leg syndrome we like it > 50 and hers is 26. Start ferrous sulfate 325mg  daily, please ask her to drink plenty of water and add fiber to diet since iron may cause some constipation and dark discoloration of stool.  Thanks.

## 2020-09-22 ENCOUNTER — Encounter (INDEPENDENT_AMBULATORY_CARE_PROVIDER_SITE_OTHER): Payer: Self-pay

## 2020-09-23 ENCOUNTER — Ambulatory Visit
Admission: RE | Admit: 2020-09-23 | Discharge: 2020-09-23 | Disposition: A | Discharge status: Home / Self Care | Payer: No Typology Code available for payment source | Source: Ambulatory Visit | Attending: Neurology | Admitting: Neurology

## 2020-09-23 ENCOUNTER — Other Ambulatory Visit: Payer: Self-pay

## 2020-09-23 MED ORDER — GADOBENATE DIMEGLUMINE 529 MG/ML IV SOLN: 15.0000 mL | Freq: Once | INTRAVENOUS | Status: DC | PRN

## 2020-09-23 MED ORDER — GADOBENATE DIMEGLUMINE 529 MG/ML IV SOLN
18.0000 mL | Freq: Once | INTRAVENOUS | Status: AC | PRN
  Administered 2020-09-23: 18 mL via INTRAVENOUS

## 2020-09-24 ENCOUNTER — Telehealth: Payer: Self-pay

## 2020-09-24 NOTE — Telephone Encounter (Signed)
Called and left a message for patient to call our office.

## 2020-09-24 NOTE — Telephone Encounter (Signed)
Patient called in and left a message returning our call 

## 2020-09-24 NOTE — Telephone Encounter (Signed)
-----   Message from Alda Berthold, DO sent at 09/24/2020 12:17 PM EST ----- Please inform pt that her MRI brain is normal.  Next step is EMG of the right arm and leg. Thanks.

## 2020-09-27 NOTE — Telephone Encounter (Signed)
Spoke with [name] informed her of Message from [employer], DO  Please inform pt that her MRI brain is normal.  Next step is EMG of the right arm and leg. Thanks. [name] acknowledged test result and said she is already scheduled for the EMG.

## 2020-10-02 ENCOUNTER — Other Ambulatory Visit: Payer: Self-pay | Admitting: Family Medicine

## 2020-10-03 ENCOUNTER — Other Ambulatory Visit: Payer: Self-pay | Admitting: Family Medicine

## 2020-10-04 ENCOUNTER — Telehealth: Payer: Self-pay

## 2020-10-04 NOTE — Telephone Encounter (Signed)
-----   Message from Donika K Patel, DO sent at 09/24/2020 12:17 PM EST ----- Please inform pt that her MRI brain is normal.  Next step is EMG of the right arm and leg. Thanks.  

## 2020-10-04 NOTE — Telephone Encounter (Signed)
Called patient and left message for a call back.  

## 2020-10-06 NOTE — Telephone Encounter (Signed)
Called patient and informed her of MRI results. Emg is scheduled. Patient had no questions or concerns.

## 2020-10-06 NOTE — Telephone Encounter (Signed)
-----   Message from Donika K Patel, DO sent at 09/24/2020 12:17 PM EST ----- Please inform pt that her MRI brain is normal.  Next step is EMG of the right arm and leg. Thanks.  

## 2020-10-18 ENCOUNTER — Encounter: Payer: Self-pay | Admitting: Obstetrics and Gynecology

## 2020-10-18 ENCOUNTER — Encounter: Payer: Self-pay | Admitting: Rheumatology

## 2020-10-18 ENCOUNTER — Other Ambulatory Visit: Payer: Self-pay

## 2020-10-18 ENCOUNTER — Ambulatory Visit: Payer: No Typology Code available for payment source | Admitting: Obstetrics and Gynecology

## 2020-10-18 VITALS — BP 118/78 | HR 89 | Ht 65.0 in | Wt 191.0 lb

## 2020-10-18 DIAGNOSIS — N951 Menopausal and female climacteric states: Secondary | ICD-10-CM | POA: Diagnosis not present

## 2020-10-18 DIAGNOSIS — Z01419 Encounter for gynecological examination (general) (routine) without abnormal findings: Secondary | ICD-10-CM

## 2020-10-18 MED ORDER — ESTRADIOL 0.05 MG/24HR TD PTTW: 1.0000 | MEDICATED_PATCH | TRANSDERMAL | 2 refills | Status: DC

## 2020-10-18 MED ORDER — VENLAFAXINE HCL 37.5 MG PO TABS: 37.5000 mg | ORAL_TABLET | Freq: Every day | ORAL | 1 refills | Status: DC

## 2020-10-18 NOTE — Patient Instructions (Addendum)
EXERCISE AND DIET:  We recommended that you start or continue a regular exercise program for good health. Regular exercise means any activity that makes your heart beat faster and makes you sweat.  We recommend exercising at least 30 minutes per day at least 3 days a week, preferably 4 or 5.  We also recommend a diet low in fat and sugar.  Inactivity, poor dietary choices and obesity can cause diabetes, heart attack, stroke, and kidney damage, among others.    ALCOHOL AND SMOKING:  Women should limit their alcohol intake to no more than 7 drinks/beers/glasses of wine (combined, not each!) per week. Moderation of alcohol intake to this level decreases your risk of breast cancer and liver damage. And of course, no recreational drugs are part of a healthy lifestyle.  And absolutely no smoking or even second hand smoke. Most people know smoking can cause heart and lung diseases, but did you know it also contributes to weakening of your bones? Aging of your skin?  Yellowing of your teeth and nails?  CALCIUM AND VITAMIN D:  Adequate intake of calcium and Vitamin D are recommended.  The recommendations for exact amounts of these supplements seem to change often, but generally speaking 600 mg of calcium (either carbonate or citrate) and 800 units of Vitamin D per day seems prudent. Certain women may benefit from higher intake of Vitamin D.  If you are among these women, your doctor will have told you during your visit.    PAP SMEARS:  Pap smears, to check for cervical cancer or precancers,  have traditionally been done yearly, although recent scientific advances have shown that most women can have pap smears less often.  However, every woman still should have a physical exam from her gynecologist every year. It will include a breast check, inspection of the vulva and vagina to check for abnormal growths or skin changes, a visual exam of the cervix, and then an exam to evaluate the size and shape of the uterus and  ovaries.  And after 53 years of age, a rectal exam is indicated to check for rectal cancers. We will also provide age appropriate advice regarding health maintenance, like when you should have certain vaccines, screening for sexually transmitted diseases, bone density testing, colonoscopy, mammograms, etc.   MAMMOGRAMS:  All women over 40 years old should have a yearly mammogram. Many facilities now offer a "3D" mammogram, which may cost around $50 extra out of pocket. If possible,  we recommend you accept the option to have the 3D mammogram performed.  It both reduces the number of women who will be called back for extra views which then turn out to be normal, and it is better than the routine mammogram at detecting truly abnormal areas.    COLONOSCOPY:  Colonoscopy to screen for colon cancer is recommended for all women at age 50.  We know, you hate the idea of the prep.  We agree, BUT, having colon cancer and not knowing it is worse!!  Colon cancer so often starts as a polyp that can be seen and removed at colonscopy, which can quite literally save your life!  And if your first colonoscopy is normal and you have no family history of colon cancer, most women don't have to have it again for 10 years.  Once every ten years, you can do something that may end up saving your life, right?  We will be happy to help you get it scheduled when you are ready.    Be sure to check your insurance coverage so you understand how much it will cost.  It may be covered as a preventative service at no cost, but you should check your particular policy.     Estradiol Transdermal Patch What is this medicine? ESTRADIOL (es tra DYE ole) skin patches contain an estrogen. It is mostly used as hormone replacement during or after menopause. It helps to treat hot flashes and prevent osteoporosis. It is also used to treat women with low estrogen levels or those who have had their ovaries removed. This medicine may be used for other  purposes; ask your health care provider or pharmacist if you have questions. COMMON BRAND NAME(S): Alora, Climara, DOTTI, Esclim, Estraderm, FemPatch, LYLLANA, Menostar, Minivelle, Vivelle, Vivelle-Dot What should I tell my health care provider before I take this medicine? They need to know if you have any of these conditions:  abnormal vaginal bleeding  blood vessel disease or blood clots  breast, cervical, endometrial, ovarian, liver, or uterine cancer  dementia  diabetes (high blood sugar)  gallbladder disease  heart disease or recent heart attack  high blood pressure  high cholesterol  high levels of calcium in the blood  hysterectomy  kidney disease  liver disease  low thyroid levels  lupus  migraine headaches  protein C deficiency  protein S deficiency  smoke tobacco cigarettes  stroke  an unusual or allergic reaction to estrogens, other medicines, foods, dyes, or preservatives  pregnant or trying to get pregnant  breast-feeding How should I use this medicine? This medicine is for external use only. Use it as directed on the prescription label. Apply the patch, sticky side to the skin, to an area that is clean, dry and hairless. Do not cut or trim the patch. Do not wear more than 1 patch at a time. Remove the old patch before using a new patch. Use a different site each time to prevent skin irritation. Keep using it unless your health care provider tells you to stop. This medicine comes with INSTRUCTIONS FOR USE. Ask your pharmacist for directions on how to use this medicine. Read the information carefully. Talk to your pharmacist or health care provider if you have questions. A patient package insert for the product will be given with each prescription and refill. Be sure to read this information carefully each time. Talk to your health care provider about the use of this medicine in children. Special care may be needed. Overdosage: If you think you have  taken too much of this medicine contact a poison control center or emergency room at once. NOTE: This medicine is only for you. Do not share this medicine with others. What if I miss a dose? If you miss a dose, apply it as soon as you can. If it is almost time for your next dose, apply only that dose. Do not apply double or extra doses. What may interact with this medicine? Do not take this medicine with any of the following medications:  aromatase inhibitors like aminoglutethimide, anastrozole, exemestane, letrozole, testolactone This medicine may also interact with the following medications:  carbamazepine  certain antibiotics like erythromycin or clarithromycin  certain antiviral medicines for HIV or hepatitis  certain medicines for fungal infections like ketoconazole, itraconazole, or posaconazole  medicines for fungus infections like itraconazole and ketoconazole  phenobarbital  raloxifene  rifampin  St. John's Wort  tamoxifen This list may not describe all possible interactions. Give your health care provider a list of all the medicines, herbs,  non-prescription drugs, or dietary supplements you use. Also tell them if you smoke, drink alcohol, or use illegal drugs. Some items may interact with your medicine. What should I watch for while using this medicine? Visit your doctor or health care provider for regular checks on your progress. You will need a regular breast and pelvic exam and Pap smear while on this medicine. You should also discuss the need for regular mammograms with your health care provider, and follow his or her guidelines for these tests. This medicine can make your body retain fluid, making your fingers, hands, or ankles swell. Your blood pressure can go up. Contact your doctor or health care provider if you feel you are retaining fluid. If you have any reason to think you are pregnant, stop taking this medicine right away and contact your doctor or health care  provider. Smoking increases the risk of getting a blood clot or having a stroke while you are taking this medicine, especially if you are more than 53 years old. You are strongly advised not to smoke. If you wear contact lenses and notice visual changes, or if the lenses begin to feel uncomfortable, consult your eye doctor or health care provider. This medicine can increase the risk of developing a condition (endometrial hyperplasia) that may lead to cancer of the lining of the uterus. Taking progestins, another hormone drug, with this medicine lowers the risk of developing this condition. Therefore, if your uterus has not been removed (by a hysterectomy), your doctor may prescribe a progestin for you to take together with your estrogen. You should know, however, that taking estrogens with progestins may have additional health risks. You should discuss the use of estrogens and progestins with your health care provider to determine the benefits and risks for you. If you are going to need surgery, an MRI, CT scan, or other procedure, tell your health care provider that you are using this medicine. You may need to remove the patch before the procedure. Contact with water while you are swimming, using a sauna, bathing, or showering may cause the patch to fall off. If your patch falls off reapply it. If you cannot reapply the patch, apply a new patch to another area and continue to follow your usual dose schedule. What side effects may I notice from receiving this medicine? Side effects that you should report to your doctor or health care provider as soon as possible:  allergic reactions (skin rash, itching or hives; swelling of the face, lips, or tongue)  blood clot (chest pain; shortness of breath; pain, swelling, or warmth in the leg)  edema (sudden weight gain; swelling of the ankles, feet, hands or other unusual swelling; trouble breathing)  light-colored stool  liver injury (dark yellow or brown  urine; general ill feeling or flu-like symptoms; loss of appetite, right upper belly pain; unusually weak or tired, yellowing of the eyes or skin)  stroke (changes in vision; confusion; trouble speaking or understanding; severe headaches; sudden numbness or weakness of the face, arm or leg; trouble walking; dizziness; loss of balance or coordination)  unusual vaginal bleeding  unusual vaginal discharge, itching, or odor Side effects that usually do not require medical attention (report to your doctor or health care provider if they continue or are bothersome):  breast pain or tenderness  hair loss  headache  mild skin irritation, redness, or dryness at patch site  mild stomach upset or pain This list may not describe all possible side effects. Call your doctor  for medical advice about side effects. You may report side effects to FDA at 1-800-FDA-1088. Where should I keep my medicine? Keep out of the reach of children and pets. Store at room temperature between 20 and 25 degrees C (68 and 77 degrees F). Keep this medicine in the original pouch until you are ready to use it. Get rid of any unused medicine after the expiration date. Get rid of used patches properly. Since used patches may still contain active medicine, fold the patch in half so that it sticks to itself before throwing it away. Put it in the trash where children and pets cannot reach it. It is important to get rid of the medicine as soon as you no longer need it or it is expired. You can do this in two ways:  Take the medicine to a medicine take-back program. Check with your pharmacy or law enforcement to find a location.  If you cannot return the medicine, ask your pharmacist or health care provider how to get rid of this medicine safely. NOTE: This sheet is a summary. It may not cover all possible information. If you have questions about this medicine, talk to your doctor, pharmacist, or health care provider.  2021  Elsevier/Gold Standard (2020-06-30 15:10:06)

## 2020-10-18 NOTE — Progress Notes (Signed)
55 y.o. [id number] Married Caucasian female here for annual exam.    Patient still complaining with hot flashes and decreased libido. States Effexor is not for her.  Not treating hot flashes well and causing her to not respond sexually.  States that gabapentin is not helpful either.  Having hot flashes day and night.  She thinks the Rinvoq may be contributing to her hot flashes.  Patient did see urology and she had a cystoscopy.  Told everything was normal.  No polyp.   Still having bruising but tested negative for blood dyscrasias.   Good bladder control now.   PCP: Orma Flaming, MD    No LMP recorded (lmp unknown). Patient has had a hysterectomy.            Sexually active: Yes.    The current method of family planning is status post hysterectomy.    Exercising: Yes.    boxing, weights and working out with a trainer Smoker:  no  Health Maintenance: Pap:  10/16/19 - normal, neg HR HPV.  History of abnormal Pap:  no MMG:  11-21-19 3D/Neg/BiRads1 Colonoscopy: 07-11-18 Neg Cologuard BMD:   N/a  Result  n/a TDaP:  06-28-18 Gardasil:   no HIV: 10-23-18 NR Hep C: 10-23-18 Neg Screening Labs:  PCP and rheumatology.    reports that she has never smoked. She has never used smokeless tobacco. She reports current alcohol use. She reports that she does not use drugs.  Past Medical History:  Diagnosis Date  . Arthritis    ra zero negative  . Asthma    mild/moderate persistent asthma  . Complication of anesthesia    slow to awaken 25 yrs ago  . Elevated liver function tests dr Neil Crouch pcp manages   for last year  . Family history of breast cancer   . Fibroid   . Frequent headaches   . GERD (gastroesophageal reflux disease)   . Gluten intolerance   . H/O seasonal allergies   . History of chicken pox   . History of COVID-19 07/2019   high fever sob, x 7 days all symptoms resolved  . Left bundle branch block 02/2019   Dr.Paula Ross  . Migraines   . PONV (postoperative nausea  and vomiting)    ponv with several surgeries, likes scopolamine patch  . Thyroid disease    Multiple benign nodules--thyroid removed    Past Surgical History:  Procedure Laterality Date  . ANTERIOR AND POSTERIOR REPAIR N/A 06/14/2020   Procedure: ANTERIOR (CYSTOCELE) AND POSTERIOR REPAIR (RECTOCELE);  Surgeon: Nunzio Cobbs, MD;  Location: [facility];  Service: Gynecology;  Laterality: N/A;  . BLADDER SUSPENSION N/A 06/14/2020   Procedure: TRANSVAGINAL TAPE (TVT) PROCEDURE/EXACT MIDURETHRAL SLING;  Surgeon: Nunzio Cobbs, MD;  Location: [facility];  Service: Gynecology;  Laterality: N/A;  EXACT MIDURETHRAL SLING  . CARDIAC CATHETERIZATION  2009   results normal done in [place]  . CHOLECYSTECTOMY  1995   laparoscopic  . CYSTOSCOPY N/A 06/14/2020   Procedure: CYSTOSCOPY;  Surgeon: Nunzio Cobbs, MD;  Location: [facility];  Service: Gynecology;  Laterality: N/A;  . HAMMER TOE SURGERY Bilateral 2009  . HERNIA REPAIR  2015   Has abdominal wall mesh - [place], [place].  Laparoscopic incisional hernia repari with 7 x 9 inch Ventralight ST with Echo mesh  . LAPAROSCOPIC HYSTERECTOMY  2007   Supracervical hysterectomy with cystosctopy - [place], [place] partial  .  THYROID SURGERY  2013   total thyroidectomy due to 11 nodules    Current Outpatient Medications  Medication Sig Dispense Refill  . acetaminophen (TYLENOL) 500 MG tablet Take 500 mg by mouth every 6 (six) hours as needed.    Marland Kitchen ADVAIR DISKUS 250-50 MCG/DOSE AEPB INHALE 1 PUFF BY MOUTH TWICE A DAY 180 each 3  . albuterol (VENTOLIN HFA) 108 (90 Base) MCG/ACT inhaler TAKE 2 PUFFS BY MOUTH EVERY 6 HOURS AS NEEDED FOR WHEEZE OR SHORTNESS OF BREATH 25.5 each 5  . cholecalciferol (VITAMIN D) 400 units TABS tablet Take 5,000 Units by mouth.     . cyclobenzaprine (FLEXERIL) 10 MG tablet TAKE 1 TABLET BY MOUTH THREE TIMES A DAY AS NEEDED FOR MUSCLE SPASMS 30 tablet 1   . Diclofenac Sodium ([name]) 2 % SOLN Place 1 application onto the skin 2 (two) times daily. (Patient taking differently: Place 1 application onto the skin daily as needed.) 112 g 2  . fexofenadine-pseudoephedrine (ALLEGRA-D 24) 180-240 MG 24 hr tablet TAKE 1 TABLET BY MOUTH EVERY DAY 30 tablet 4  . gabapentin (NEURONTIN) 800 MG tablet Take 1 tablet (800 mg total) by mouth at bedtime. 90 tablet 1  . hydroxychloroquine (PLAQUENIL) 200 MG tablet TAKE 1 TABLET BY MOUTH TWICE A DAY 180 tablet 0  . levothyroxine (SYNTHROID) 150 MCG tablet TAKE 1 TABLET BY MOUTH EVERY OTHER DAY 45 tablet 3  . montelukast (SINGULAIR) 10 MG tablet TAKE 1 TABLET BY MOUTH EVERYDAY AT BEDTIME 90 tablet 3  . RINVOQ 15 MG TB24 Take 1 tablet by mouth daily.    . rosuvastatin (CRESTOR) 5 MG tablet TAKE 1/2 TABLET BY MOUTH DAILY 45 tablet 3  . SYNTHROID 175 MCG tablet Take 175 mcg by mouth every other day.     . venlafaxine XR (EFFEXOR XR) 37.5 MG 24 hr capsule Take 1 capsule (37.5 mg total) by mouth daily. 30 capsule 2   No current facility-administered medications for this visit.    Family History  Problem Relation Age of Onset  . Arthritis Mother   . Cancer Mother 86       breast ca--dec age 65  . Miscarriages / [place] Mother   . Breast cancer Mother 48  . Diabetes Father   . Heart attack Father   . Heart disease Father        pacemaker   . Hyperlipidemia Father   . Hypertension Father   . Stroke Father        x4  . COPD Sister   . Peripheral Artery Disease Sister   . Asthma Brother   . Birth defects Brother   . Cancer Brother        skin  . Hyperlipidemia Brother   . Heart attack Maternal Grandmother   . Heart disease Maternal Grandmother   . Hyperlipidemia Maternal Grandmother   . Stroke Maternal Grandmother   . Alcohol abuse Paternal Grandmother   . Cancer Paternal Grandmother        colon  . Cancer Paternal Grandfather        breast  . Heart attack Paternal Grandfather   . Breast cancer  Paternal Grandfather 55  . Alcohol abuse Brother   . Early death Brother        suicide  . Mental illness Brother   . Asthma Daughter   . Asthma Son   . Asthma Son   . Breast cancer Maternal Aunt 60  . Kidney cancer Paternal Aunt 85  . Breast  cancer Paternal Aunt 50  . Throat cancer Paternal Uncle   . Thyroid cancer Paternal Uncle   . Breast cancer Cousin 65  . Prostate cancer Cousin     Review of Systems  All other systems reviewed and are negative.   Exam:   BP 118/78   Pulse 89   Ht 5\' 5"  (1.651 m)   Wt 191 lb (86.6 kg)   LMP  (LMP Unknown)   SpO2 100%   BMI 31.78 kg/m     General appearance: alert, cooperative and appears stated age Head: normocephalic, without obvious abnormality, atraumatic Neck: no adenopathy, supple, symmetrical, trachea midline and thyroid normal to inspection and palpation Lungs: clear to auscultation bilaterally Breasts: normal appearance, no masses or tenderness, No nipple retraction or dimpling, No nipple discharge or bleeding, No axillary adenopathy Heart: regular rate and rhythm Abdomen: soft, non-tender; no masses, no organomegaly Extremities: extremities normal, atraumatic, no cyanosis or edema Skin: skin color, texture, turgor normal. No rashes or lesions Lymph nodes: cervical, supraclavicular, and axillary nodes normal. Neurologic: grossly normal  Pelvic: External genitalia:  no lesions              No abnormal inguinal nodes palpated.              Urethra:  normal appearing urethra with no masses, tenderness or lesions              Bartholins and Skenes: normal                 Vagina: normal appearing vagina with normal color and discharge, no lesions              Cervix: no lesions              Pap taken: No. Bimanual Exam:  Uterus:  normal size, contour, position, consistency, mobility, non-tender              Adnexa: no mass, fullness, tenderness              Rectal exam: Yes.  .  Confirms.              Anus:  normal  sphincter tone, no lesions  Chaperone was present for exam.  Assessment:   Well woman visit with normal exam. Status post laparoscopic supracervical hysterectomy. Status post ant and post repair with TVT, cystoscopy.  Post op hematoma.  Resolved.  FH breast cancer.  Genetic testing negative.  Lifetime risk of breast cancer 14.9%.  Menopausal symptoms.  On Effexor and Neurontin.  Decreased libido.  Rheumatoid arthritis.    Plan: Mammogram screening discussed. Self breast awareness reviewed. Pap and HR HPV as above. Guidelines for Calcium, Vitamin D, regular exercise program including cardiovascular and weight bearing exercise. Stop Effexor XR and start Effexor 37.5 mg once daily for one month and then try to stop this.  Start Vivelle Dot 0.05 mg twice weekly.  I discussed potential risk of stroke, DVT, PE. Follow up for a recheck in 2 months.  Follow up annually and prn.

## 2020-10-19 NOTE — Progress Notes (Addendum)
Office Visit Note  Patient: [name]             Date of Birth: April 19, 1966           MRN: [id number]             PCP: Orma Flaming, MD Referring: Orma Flaming, MD Visit Date: 10/20/2020 Occupation: @GUAROCC @  Subjective:  Other (Bruising, hot flashes, night sweats, joint pain and swelling since re-starting rinvoq. )    History of Present Illness: [name] is a 55 y.o. female with history of seropositive rheumatoid arthritis.  She states on June 15, 2020 she had bladder sling surgery.  She developed a hematoma after that and was referred to hematology.  She states she has been having increased bruising.  Her hematological work-up was negative per patient.  Her hematologist told her that she should get her medications evaluated which may be contributing to the bruising.  She was off Rinvoq for about 60 days and then she resumed Rinvoq August 03, 2020.  She continued Plaquenil throughout the surgery.  Been having pain and discomfort in bilateral wrist joints, bilateral feet and her left trochanteric area.  He has noticed some swelling in her right wrist joint.  She has been also experiencing increased hot flashes.  She was told by her GYN that she is 2 years postmenopausal.  Activities of Daily Living:  Patient reports morning stiffness for all day.  Patient Reports nocturnal pain.  Difficulty dressing/grooming: Denies Difficulty climbing stairs: Reports Difficulty getting out of chair: Denies Difficulty using hands for taps, buttons, cutlery, and/or writing: Denies  Review of Systems  Constitutional: Positive for night sweats. Negative for fatigue.  HENT: Positive for mouth dryness and nose dryness. Negative for mouth sores.   Eyes: Positive for dryness. Negative for pain and itching.  Respiratory: Negative for shortness of breath and difficulty breathing.   Cardiovascular: Negative for chest pain and palpitations.  Gastrointestinal: Negative for blood in stool,  constipation and diarrhea.  Endocrine: Negative for increased urination.  Genitourinary: Negative for difficulty urinating.  Musculoskeletal: Positive for arthralgias, joint pain, joint swelling and morning stiffness. Negative for myalgias, muscle tenderness and myalgias.  Skin: Positive for rash. Negative for color change and redness.  Allergic/Immunologic: Negative for susceptible to infections.  Neurological: Positive for night sweats. Negative for dizziness, headaches, memory loss and weakness.  Hematological: Positive for bruising/bleeding tendency.  Psychiatric/Behavioral: Positive for sleep disturbance. Negative for confusion.    PMFS History:  Patient Active Problem List   Diagnosis Date Noted  . Status post surgery 06/14/2020  . Genetic testing 02/18/2020  . Family history of breast cancer   . High risk medication use 10/30/2019  . Left bundle branch block 04/28/2019  . Right wrist pain 03/13/2019  . Hamstring strain, left, initial encounter 02/20/2019  . 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 Primary osteoarthritis of both feet 10/09/2018  . Hx of migraines 09/27/2018  . History of gastroesophageal reflux (GERD) 09/27/2018  . 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 Moderate persistent asthma 07/05/2018  . Hypothyroid 06/28/2018    Past Medical History:  Diagnosis Date  . Arthritis    ra zero negative  . Asthma    mild/moderate persistent asthma  . Complication of anesthesia    slow to awaken 25 yrs ago  . Elevated liver function tests dr Neil Crouch pcp manages   for last year  . Family history of breast cancer   . Fibroid   . Frequent headaches   .  GERD (gastroesophageal reflux disease)   . Gluten intolerance   . H/O seasonal allergies   . History of chicken pox   . History of COVID-19 07/2019   high fever sob, x 7 days all symptoms resolved  . Left bundle branch block 02/2019   Dr.Paula Ross  . Migraines   . PONV (postoperative nausea  and vomiting)    ponv with several surgeries, likes scopolamine patch  . Thyroid disease    Multiple benign nodules--thyroid removed    Family History  Problem Relation Age of Onset  . Arthritis Mother   . Cancer Mother 26       breast ca--dec age 64  . Miscarriages / [place] Mother   . Breast cancer Mother 58  . Diabetes Father   . Heart attack Father   . Heart disease Father        pacemaker   . Hyperlipidemia Father   . Hypertension Father   . Stroke Father        x4  . COPD Sister   . Peripheral Artery Disease Sister   . Asthma Brother   . Birth defects Brother   . Cancer Brother        skin  . Hyperlipidemia Brother   . Heart attack Maternal Grandmother   . Heart disease Maternal Grandmother   . Hyperlipidemia Maternal Grandmother   . Stroke Maternal Grandmother   . Alcohol abuse Paternal Grandmother   . Cancer Paternal Grandmother        colon  . Cancer Paternal Grandfather        breast  . Heart attack Paternal Grandfather   . Breast cancer Paternal Grandfather 35  . Alcohol abuse Brother   . Early death Brother        suicide  . Mental illness Brother   . Asthma Daughter   . Asthma Son   . Asthma Son   . Breast cancer Maternal Aunt 60  . Kidney cancer Paternal Aunt 85  . Breast cancer Paternal Aunt 26  . Throat cancer Paternal Uncle   . Thyroid cancer Paternal Uncle   . Breast cancer Cousin 10  . Prostate cancer Cousin    Past Surgical History:  Procedure Laterality Date  . ANTERIOR AND POSTERIOR REPAIR N/A 06/14/2020   Procedure: ANTERIOR (CYSTOCELE) AND POSTERIOR REPAIR (RECTOCELE);  Surgeon: Nunzio Cobbs, MD;  Location: [facility];  Service: Gynecology;  Laterality: N/A;  . BLADDER SUSPENSION N/A 06/14/2020   Procedure: TRANSVAGINAL TAPE (TVT) PROCEDURE/EXACT MIDURETHRAL SLING;  Surgeon: Nunzio Cobbs, MD;  Location: [facility];  Service: Gynecology;  Laterality: N/A;  EXACT MIDURETHRAL  SLING  . CARDIAC CATHETERIZATION  2009   results normal done in [place]  . CHOLECYSTECTOMY  1995   laparoscopic  . CYSTOSCOPY N/A 06/14/2020   Procedure: CYSTOSCOPY;  Surgeon: Nunzio Cobbs, MD;  Location: [facility];  Service: Gynecology;  Laterality: N/A;  . HAMMER TOE SURGERY Bilateral 2009  . HERNIA REPAIR  2015   Has abdominal wall mesh - [place], [place].  Laparoscopic incisional hernia repari with 7 x 9 inch Ventralight ST with Echo mesh  . LAPAROSCOPIC HYSTERECTOMY  2007   Supracervical hysterectomy with cystosctopy - [place], [place] partial  . THYROID SURGERY  2013   total thyroidectomy due to 11 nodules   Social History   Social History Narrative   Right Handed   Lives in a two story home  Immunization History  Administered Date(s) Administered  . Influenza,inj,Quad PF,6+ Mos 06/28/2018, 06/18/2019  . Influenza-Unspecified 01/25/2018, 08/05/2020  . PFIZER(Purple Top)SARS-COV-2 Vaccination 12/08/2019, 01/05/2020, 08/05/2020  . Pneumococcal Polysaccharide-23 10/22/2018  . Tdap 06/28/2018  . Zoster Recombinat (Shingrix) 04/15/2019, 07/16/2019     Objective: Vital Signs: BP 130/84 (BP Location: Left Arm, Patient Position: Sitting, Cuff Size: Normal)   Pulse 79   Resp 13   Ht 5\' 7"  (1.702 m)   Wt 198 lb 9.6 oz (90.1 kg)   LMP  (LMP Unknown)   BMI 31.11 kg/m    Physical Exam Vitals and nursing note reviewed.  Constitutional:      Appearance: She is well-developed and well-nourished.  HENT:     Head: Normocephalic and atraumatic.  Eyes:     Extraocular Movements: EOM normal.     Conjunctiva/sclera: Conjunctivae normal.  Cardiovascular:     Rate and Rhythm: Normal rate and regular rhythm.     Pulses: Intact distal pulses.     Heart sounds: Normal heart sounds.  Pulmonary:     Effort: Pulmonary effort is normal.     Breath sounds: Normal breath sounds.  Abdominal:     General: Bowel sounds are normal.     Palpations: Abdomen is soft.   Musculoskeletal:     Cervical back: Normal range of motion.  Lymphadenopathy:     Cervical: No cervical adenopathy.  Skin:    General: Skin is warm and dry.     Capillary Refill: Capillary refill takes less than 2 seconds.     Comments: 1 bruise was noted over the right arm and couple of bruises on her right lower extremities.  She has very prominent veins.  Neurological:     Mental Status: She is alert and oriented to person, place, and time.  Psychiatric:        Mood and Affect: Mood and affect normal.        Behavior: Behavior normal.      Musculoskeletal Exam: C-spine, thoracic and lumbar spine with good range of motion.  Shoulder joints, elbow joints, wrist joints, MCPs PIPs and DIPs with good range of motion with no synovitis.  She has some tenderness over bilateral wrist joints.  She had tenderness over left trochanteric bursa.  Hip joints, knee joints, ankles with good range of motion with no synovitis.  She had tenderness on palpation of bilateral ankles.  There was no tenderness over MTPs or PIPs.  CDAI Exam: CDAI Score: 2.9  Patient Global: 5 mm; Provider Global: 4 mm Swollen: 0 ; Tender: 4  Joint Exam 10/20/2020      Right  Left  Wrist   Tender   Tender  Ankle   Tender   Tender     Investigation: No additional findings.  Imaging: MR BRAIN W WO CONTRAST  Result Date: 09/23/2020 CLINICAL DATA:  Paresthesia. Hyper reflexia. Ankle and arm numbness. EXAM: MRI HEAD WITHOUT AND WITH CONTRAST TECHNIQUE: Multiplanar, multiecho pulse sequences of the brain and surrounding structures were obtained without and with intravenous contrast. CONTRAST:  57mL MULTIHANCE GADOBENATE DIMEGLUMINE 529 MG/ML IV SOLN COMPARISON:  None. FINDINGS: Brain: No acute infarction, hemorrhage, hydrocephalus, extra-axial collection or mass lesion. Normal white matter. Normal enhancement postcontrast administration Vascular: Normal arterial flow voids. Skull and upper cervical spine: No focal skeletal  lesion. Sinuses/Orbits: Mild mucosal edema paranasal sinuses. Negative orbit Other: None IMPRESSION: Normal MRI of the brain with contrast. Electronically Signed   By: Franchot Gallo M.D.   On: 09/23/2020  09:08    Recent Labs: Lab Results  Component Value Date   WBC 6.6 07/27/2020   HGB 12.8 07/27/2020   PLT 298 07/27/2020   NA 140 06/14/2020   K 3.9 06/14/2020   CL 101 06/14/2020   CO2 28 06/14/2020   GLUCOSE 90 06/14/2020   BUN 6 06/14/2020   CREATININE 0.80 06/14/2020   BILITOT 0.8 07/27/2020   ALKPHOS 54 07/27/2020   AST 34 07/27/2020   ALT 24 07/27/2020   PROT 7.2 07/27/2020   ALBUMIN 4.0 07/27/2020   CALCIUM 10.0 06/14/2020   GFRAA >60 06/14/2020   QFTBGOLDPLUS NEGATIVE 10/14/2019    Speciality Comments: PLQ Eye Exam: 05/10/2020 [facility] Follow up in 1 year.   Inadequate response to methotrexate, Enbrel, Humira, Orencia-hives everywhere  Procedures:  No procedures performed Allergies: Orencia [abatacept], Shellfish allergy, Strawberry (diagnostic), Gluten meal, Lortab [hydrocodone-acetaminophen], Morphine and related, and Augmentin [amoxicillin-pot clavulanate]   Assessment / Plan:     Visit Diagnoses: Rheumatoid arthritis of multiple sites with negative rheumatoid factor (HCC) - Diagnosed at [facility]. The ultrasound examination performed showed synovitis in bilateral hands: She was doing much better on Plaquenil and Rinvoq combination.  She had surgery in September and was off Rinvoq for 2 months.  She resumed Rinvoq in November.  She states she has been having ongoing pain and stiffness.  She Describes discomfort in her wrist joints and ankle joints.  I did not see any synovitis on my examination.  She has been reading about Rinvoq.  She states she would like to start on estrogen patch due to hot flashes and postmenopausal symptoms.  She is concerned about increased risk of clotting with the combination therapy.  She is also concerned  if the bruising she has developed in the last few months is coming from removal.  I reviewed the information on Rinvoq.  I do not believe that it is causing increased risk of bruising.  I also discussed the black box warning on Rinvoq with the patient which increases the increased risk of clotting, MI and VTE.  Increased risk of malignancy was also discussed.  I gave her the option of trying Kevzara.  She has failed to anti-TNF's and methotrexate in the past.  Orencia caused hives in the past.  Her arthritis appears to be quite well controlled on current combination.  I also mentioned that Plaquenil contributes to increased bruising.  She may consider stopping Plaquenil.  After reviewing indications side effects contraindications of Carlis Abbott she wants to discontinue of Rinvoq and would start on Kevzara once approved.  We will apply for Kevzara.  Handout on Carlis Abbott was given and consent was taken.  Medication counseling:   Counseled patient that Carlis Abbott is an IL-6 blocking agent.  Reviewed Kevzara dose of 200 mg subcu every 2 weeks.  Counseled patient on purpose, proper use, and adverse effects of Kevzara.  Reviewed the most common adverse effects including infections, injection site reaction, bowel injury, and rarely cancer.  Reviewed that the medication should be held during infections.  Discussed that there is the possibility of an increased risk of malignancy but it is not well understood if this increased risk is due to the medication or the disease state.  Reviewed the importance of regular labs while on Kevzara therapy including the need for routine lipid panel.  Advised patient to get standing labs one month after starting Kevzara.  Provided patient with standing lab orders.  Counseled patient that Carlis Abbott should  be held prior to scheduled surgery.  Counseled patient to avoid live vaccines while on Kevzara.  Advised patient to get annual influenza vaccine and the pneumococcal vaccine.  Provided patient  with medication education material and answered all questions.  Patient voiced understanding.  Patient consented to [facility].  Will upload consent into the media tab.  Reviewed storage instructions of [name] with patient.  Advised it should be kept in the refrigerator until she is ready for use.  Advised patient that initial Kevzara injection must be given in the office.  Will apply for Kevzara through patient's insurance.     High risk medication use - Plaquenil 200 mg 1 tab BID, Rinvoq 15 mg daily.Inadequate response to Enbrel and Humira.  She developed hives on Orencia.. D/c MTX-elevated LFTs  Primary osteoarthritis of both feet-she had no synovitis on examination.  Trochanteric bursitis of both hips-she continues to have some left trochanteric bursitis.  Bruising-she has noticed increased bruising in the last 2 months.  She has very superficial veins.  She was also evaluated by hematology and all the work-up was negative.  History of asthma  Hx of migraines  History of gastroesophageal reflux (GERD)  History of hypothyroidism  Decreased cardiac ejection fraction  Hot flashes-patient has been experiencing hot flashes.  She was evaluated by her GYN and was advised the use of Vivelle-Dot.  She is concerned about thromboembolism.  Orders: Orders Placed This Encounter  Procedures  . CBC with Differential/Platelet  . COMPLETE METABOLIC PANEL WITH GFR  . Lipid panel  . QuantiFERON-TB Gold Plus   No orders of the defined types were placed in this encounter.     Follow-Up Instructions: Return for Rheumatoid arthritis.   Bo Merino, MD  Note - This record has been created using Editor, commissioning.  Chart creation errors have been sought, but may not always  have been located. Such creation errors do not reflect on  the standard of medical care.

## 2020-10-20 ENCOUNTER — Ambulatory Visit: Payer: No Typology Code available for payment source | Admitting: Rheumatology

## 2020-10-20 ENCOUNTER — Other Ambulatory Visit: Payer: Self-pay

## 2020-10-20 ENCOUNTER — Telehealth: Payer: Self-pay | Admitting: Pharmacist

## 2020-10-20 ENCOUNTER — Encounter: Payer: Self-pay | Admitting: Rheumatology

## 2020-10-20 VITALS — BP 130/84 | HR 79 | Resp 13 | Ht 67.0 in | Wt 198.6 lb

## 2020-10-20 DIAGNOSIS — T148XXA Other injury of unspecified body region, initial encounter: Secondary | ICD-10-CM

## 2020-10-20 DIAGNOSIS — M19071 Primary osteoarthritis, right ankle and foot: Secondary | ICD-10-CM

## 2020-10-20 DIAGNOSIS — M7062 Trochanteric bursitis, left hip: Secondary | ICD-10-CM

## 2020-10-20 DIAGNOSIS — R202 Paresthesia of skin: Secondary | ICD-10-CM

## 2020-10-20 DIAGNOSIS — Z8669 Personal history of other diseases of the nervous system and sense organs: Secondary | ICD-10-CM

## 2020-10-20 DIAGNOSIS — M19072 Primary osteoarthritis, left ankle and foot: Secondary | ICD-10-CM

## 2020-10-20 DIAGNOSIS — M0609 Rheumatoid arthritis without rheumatoid factor, multiple sites: Secondary | ICD-10-CM

## 2020-10-20 DIAGNOSIS — Z79899 Other long term (current) drug therapy: Secondary | ICD-10-CM | POA: Diagnosis not present

## 2020-10-20 DIAGNOSIS — Z8709 Personal history of other diseases of the respiratory system: Secondary | ICD-10-CM

## 2020-10-20 DIAGNOSIS — R931 Abnormal findings on diagnostic imaging of heart and coronary circulation: Secondary | ICD-10-CM

## 2020-10-20 DIAGNOSIS — Z8639 Personal history of other endocrine, nutritional and metabolic disease: Secondary | ICD-10-CM

## 2020-10-20 DIAGNOSIS — M7061 Trochanteric bursitis, right hip: Secondary | ICD-10-CM | POA: Diagnosis not present

## 2020-10-20 DIAGNOSIS — Z8719 Personal history of other diseases of the digestive system: Secondary | ICD-10-CM

## 2020-10-20 DIAGNOSIS — R232 Flushing: Secondary | ICD-10-CM

## 2020-10-20 DIAGNOSIS — R7989 Other specified abnormal findings of blood chemistry: Secondary | ICD-10-CM

## 2020-10-20 NOTE — Patient Instructions (Addendum)
Standing Labs We placed an order today for your standing lab work.   Please have your standing labs drawn in 1 month and every 3 months after starting [name]  If possible, please have your labs drawn 2 weeks prior to your appointment so that the provider can discuss your results at your appointment.  We have open lab daily Monday through Thursday from 8:30-12:30 PM and 1:30-4:30 PM and Friday from 8:30-12:30 PM and 1:30-4:00 PM at the office of Dr. Bo Merino, [facility].   Please be advised, all patients with office appointments requiring lab work will take precedents over walk-in lab work.  If possible, please come for your lab work on Monday and Friday afternoons, as you may experience shorter wait times. The office is located at [address], [place], [place], [place] [address] No appointment is necessary.   Labs are drawn by [facility]. Please bring your co-pay at the time of your lab draw.  You may receive a bill from [facility] for your lab work.  If you wish to have your labs drawn at another location, please call the office 24 hours in advance to send orders.  If you have any questions regarding directions or hours of operation,  please call [phone].   As a reminder, please drink plenty of water prior to coming for your lab work. Thanks!  COVID-19 vaccine recommendations:   COVID-19 vaccine is recommended for everyone (unless you are allergic to a vaccine component), even if you are on a medication that suppresses your immune system.   If you are on Methotrexate, Cellcept (mycophenolate), Rinvoq, Morrie Sheldon, and Olumiant- hold the medication for 1 week after each vaccine. Hold Methotrexate for 2 weeks after the single dose COVID-19 vaccine.   If you are on Orencia subcutaneous injection - hold medication one week prior to and one week after the first COVID-19 vaccine dose (only).   If you are on Orencia IV infusions- time vaccination administration so that the  first COVID-19 vaccination will occur four weeks after the infusion and postpone the subsequent infusion by one week.   If you are on Cyclophosphamide or Rituxan infusions please contact your doctor prior to receiving the COVID-19 vaccine.   For immunocompromised individuals the recommendations are to have COVID-19 vaccine 3 doses 1 month apart and then fourth dose (booster) 6 months after the third dose.  Do not take Tylenol or any anti-inflammatory medications (NSAIDs) 24 hours prior to the COVID-19 vaccination.   There is no direct evidence about the efficacy of the COVID-19 vaccine in individuals who are on medications that suppress the immune system.   Even if you are fully vaccinated, and you are on any medications that suppress your immune system, please continue to wear a mask, maintain at least six feet social distance and practice hand hygiene.   If you develop a COVID-19 infection, please contact your PCP or our office to determine if you need monoclonal antibody infusion.  The booster vaccine is now available for immunocompromised patients.   Please see the following web sites for updated information.   [email or web address]  Sarilumab injection What is this medicine? SARILUMAB (sar IL ue mab) is used to treat adults with rheumatoid arthritis (RA). This medicine helps reduce joint pain and swelling. This medicine is often used with other medicines. This medicine may be used for other purposes; ask your health care provider or pharmacist if you have questions. COMMON BRAND NAME(S): KEVZARA What should I tell my health care provider before  I take this medicine? They need to know if you have any of these conditions:  cancer  diabetes  diverticulitis  hepatitis B or history of hepatitis B infection  high cholesterol  immune system problems  infection (especially a virus infection such as chickenpox, cold  sores, or herpes)  liver disease  low blood counts, like low white cell, platelets, or red cell counts  recently received or scheduled to receive a vaccine  scheduled to have surgery  stomach or intestinal problems  tuberculosis, a positive skin test for tuberculosis or have recently been in close contact with someone who has tuberculosis  an unusual or allergic reaction to sarilumab, other medicines, foods, dyes or preservatives  pregnant or trying to get pregnant  breast-feeding How should I use this medicine? This medicine is for injection under the skin. You will be taught how to prepare and give this medicine. Use exactly as directed. Take your medicine at regular intervals. Do not take your medicine more often than directed. It is important that you put your used needles, syringes, or pens in a special sharps container. Do not put them in a trash can. If you do not have a sharps container, call your pharmacist or healthcare provider to get one. A special MedGuide will be given to you by the pharmacist with each prescription and refill. Be sure to read this information carefully each time. Talk to your pediatrician regarding the use of this medicine in children. Special care may be needed. Overdosage: If you think you have taken too much of this medicine contact a poison control center or emergency room at once. NOTE: This medicine is only for you. Do not share this medicine with others. What if I miss a dose? If you miss a dose, take it as soon as you can. If it is almost time for your next dose, take only that dose. Do not take double or extra doses. What may interact with this medicine? Do not take this medicine with any of the following medications:  live virus vaccines This medicine may also interact with the following medications:  biologic medicines such as abatacept, adalimumab, anakinra, certolizumab, etanercept, golimumab, infliximab, ofatumumab, rituximab,  secukinumab, tocilizumab, tofacitinib, and ustekinumab  birth control pills  certain medicines for cholesterol like atorvastatin, lovastatin, and simvastatin  certain medicines that treat or prevent blood clots like warfarin, apixaban, and rivaroxaban  medicines that lower your chance of fighting infection  steroid medicines like prednisone or cortisone  theophylline This list may not describe all possible interactions. Give your health care provider a list of all the medicines, herbs, non-prescription drugs, or dietary supplements you use. Also tell them if you smoke, drink alcohol, or use illegal drugs. Some items may interact with your medicine. What should I watch for while using this medicine? Tell your doctor or healthcare professional if your symptoms do not start to get better or if they get worse. You may need blood work done while you are taking this medicine. This medicine may increase your risk of getting an infection. Call your health care professional for advice if you get a fever, chills, or sore throat, or other symptoms of a cold or flu. Do not treat yourself. This medicine decreases your body's ability to fight infections. Try to avoid being around people who are sick. Call your health care professional if you are around anyone with measles, chickenpox, or if you develop sores or blisters that do not heal properly. Talk to your health care  professional if you have not had chickenpox or the vaccine for chickenpox. If you are going to need surgery or other procedure, tell your health care professional that you are using this medicine. Talk to your doctor about your risk of cancer. You may be at risk for certain types of cancers if you take this medicine. What side effects may I notice from receiving this medicine? Side effects that you should report to your doctor or health care professional as soon as possible:  allergic reactions like skin rash, itching or hives, swelling of  the face, lips, or tongue  breathing problems  chest pain  feeling faint or lightheaded, falls  low blood counts - this medicine may decrease the number of white blood cells, red blood cells, and platelets. You may be at increased risk for infections and bleeding  signs and symptoms of infection like fever or chills; cough; sore throat; pain or trouble passing urine  signs and symptoms of liver injury like dark yellow or brown urine; general ill feeling or flu-like symptoms; light-colored stools; loss of appetite; nausea; right upper belly pain; unusually weak or tired; yellowing of the eyes or skin  sudden abdominal pain. vomiting Side effects that usually do not require medical attention (report these to your doctor or health care professional if they continue or are bothersome):  pain, redness, or irritation at site where injected  runny or stuffy nose This list may not describe all possible side effects. Call your doctor for medical advice about side effects. You may report side effects to FDA at 1-800-FDA-1088. Where should I keep my medicine? Keep out of the reach of children. Store in the prefilled syringes or pens in the refrigerator at 2 to 8 degrees C (36 to 46 degrees F). Do not freeze. Do not shake. Keep in the original container to protect from light. Throw away any unopened and unused medicine that has been stored in the refrigerator after the expiration date. If needed, the syringe or pen may be stored at room temperature up to 25 degrees C (77 degrees F) for up to 14 days. Keep in the original carton. Do not shake. Throw away any unused medicine that is stored at room temperature after 14 days. NOTE: This sheet is a summary. It may not cover all possible information. If you have questions about this medicine, talk to your doctor, pharmacist, or health care provider.  2021 Elsevier/Gold Standard (2018-12-23 18:56:57)

## 2020-10-20 NOTE — Progress Notes (Signed)
Pharmacy Note Subjective: Patient presents today to [facility] for follow up office visit with Dr. Estanislado Pandy.    Patient seen by the pharmacist for counseling on Kevzara for rheumatoid arthritis.  Prior therapy includes: methotrexate, Humira, Enbrel, Orencia. She is currently on Rinvoq and hydroxychloroquine. Patient is 2-years post-menopausal and is reporting increased bruising as well as hot flashes. She has history of hysterectomy in 2007. Reports that hot flashes had stopped when she had held Rinvoq, and returned when Rinvoq was restarted. She has not yet started estrogen transdermal patches that were prescribed due to current Rinvoq use. She's talked in detail about her options for systemic post-menopausal symptoms with GYN - she has tried non-hormonal options like venlafaxine with no symptom improvement. She reported that the hot flashes are bothersome and affect her quality of life.   Objective:  CBC    Component Value Date/Time   WBC 6.6 07/27/2020 1038   WBC 10.9 (H) 06/15/2020 0640   RBC 4.43 07/27/2020 1038   HGB 12.8 07/27/2020 1038   HGB 12.2 07/05/2020 0936   HCT 38.0 07/27/2020 1038   HCT 35.3 07/05/2020 0936   PLT 298 07/27/2020 1038   PLT 275 07/05/2020 0936   MCV 85.8 07/27/2020 1038   MCV 88 07/05/2020 0936   MCH 28.9 07/27/2020 1038   MCHC 33.7 07/27/2020 1038   RDW 12.6 07/27/2020 1038   RDW 12.7 07/05/2020 0936   LYMPHSABS 1.8 07/27/2020 1038   LYMPHSABS 1.8 07/05/2020 0936   MONOABS 0.5 07/27/2020 1038   EOSABS 0.2 07/27/2020 1038   EOSABS 0.2 07/05/2020 0936   BASOSABS 0.1 07/27/2020 1038   BASOSABS 0.1 07/05/2020 0936    CMP     Component Value Date/Time   NA 140 06/14/2020 0641   NA 137 07/25/2016 0000   K 3.9 06/14/2020 0641   CL 101 06/14/2020 0641   CO2 28 06/14/2020 0625   GLUCOSE 90 06/14/2020 0641   BUN 6 06/14/2020 0641   CREATININE 0.80 06/14/2020 0641   CREATININE 0.87 05/14/2020 1358   CALCIUM 10.0 06/14/2020 0625   PROT  7.2 07/27/2020 1015   PROT 6.5 07/28/2019 0847   ALBUMIN 4.0 07/27/2020 1015   ALBUMIN 4.5 07/28/2019 0847   AST 34 07/27/2020 1015   ALT 24 07/27/2020 1015   ALKPHOS 54 07/27/2020 1015   BILITOT 0.8 07/27/2020 1015   BILITOT 0.6 07/28/2019 0847   GFRNONAA >60 06/14/2020 0625   GFRNONAA 77 05/14/2020 1358   GFRAA >60 06/14/2020 0625   GFRAA 89 05/14/2020 1358     Baseline Immunosuppressant Therapy Labs  TB GOLD Quantiferon TB Gold Latest Ref Rng & Units 10/14/2019  Quantiferon TB Gold Plus NEGATIVE NEGATIVE   Hepatitis Panel Hepatitis Latest Ref Rng & Units 10/23/2018  Hep B Surface Ag NON-REACTI NON-REACTIVE  Hep B IgM NON-REACTI NON-REACTIVE  Hep C Ab NON-REACTI NON-REACTIVE  Hep C Ab NON-REACTI NON-REACTIVE  Hep A IgM NON-REACTI NON-REACTIVE   HIV Lab Results  Component Value Date   HIV NON-REACTIVE 10/23/2018   Immunoglobulins Immunoglobulin Electrophoresis Latest Ref Rng & Units 10/23/2018  IgA  47 - 310 mg/dL 195  IgG 600 - 1,640 mg/dL 890  IgM 50 - 300 mg/dL 115   SPEP Serum Protein Electrophoresis Latest Ref Rng & Units 07/27/2020  Total Protein 6.5 - 8.1 g/dL 7.2  Albumin 3.8 - 4.8 g/dL -  Alpha-1 0.2 - 0.3 g/dL -  Alpha-2 0.5 - 0.9 g/dL -  Beta Globulin 0.4 - 0.6 g/dL -  Beta 2 0.2 - 0.5 g/dL -  Gamma Globulin 0.8 - 1.7 g/dL -   G6PD Lab Results  Component Value Date   G6PDH 16.2 09/27/2018   TPMT No results found for: TPMT  Lipid Panel Lab Results  Component Value Date   CHOL 147 07/28/2019   HDL 71 07/28/2019   LDLCALC 63 07/28/2019   TRIG 67 07/28/2019   CHOLHDL 2.1 07/28/2019    Chest x-ray: 10/23/2018 wnl  Assessment/Plan:  Per my literature review, Rinvoq does not increase risk of hot flashes. However, use of estrogen concomitantly with Rinvoq can increase risk of VTE and the black box warning on all medications in this class.  I provided patient with information about post-menopausal hormonal use and risks for her to review although she  has discussed in detail with her GYN. We discussed stopping Rinvoq and a trial of another medication class for RA (such as Kevzara) which may take up to 3 months to see full effectiveness - which may lead to some RA-related discomfort.  She does have a history of injection-site related reactions.  We also discussed that if she continued using Rinvoq, then she may reconsider trying an SSRI (like paroxetine) since she's tried and failed an SNRI (venlafaxine) for her hot flashes.    Counseled patient that [name] is an IL-6 blocking agent.  Counseled patient on purpose, proper use, and adverse effects of Kevzara.  Reviewed the most common adverse effects including infections, injection site reaction, bowel injury, and rarely cancer.  Reviewed that the medication should be held during infections.  Discussed that there is the possibility of an increased risk of malignancy but it is not well understood if this increased risk is due to the medication or the disease state.    Advised patient to get standing labs one month after starting Kevzara then every 3 months.  Provided patient with standing lab orders.  Counseled patient that [name] should be held prior to scheduled surgery.  Counseled patient to avoid live vaccines while on Kevzara. Recommend annual influenza, Pneumovax 23, Prevnar 13, and Shingrix as indicated.   Reviewed the importance of regular labs while on Kevzara therapy including the need for routine lipid panel. We will draw updated CBC, CMP, TB gold, and lipid panel. Provided patient with medication education material and answered all questions.  Patient voiced understanding.  Patient consented to [facility].  Will upload consent into the media tab.  Reviewed storage instructions of [name] with patient.  Advised it should be kept in the refrigerator until she is ready for use.  Advised patient that initial Kevzara injection must be given in the office.  Will apply for Kevzara through patient's insurance.     Patient dose will be Kevzara 200 mg every 14 days.  Prescription pending lab results and/or insurance approval.

## 2020-10-20 NOTE — Telephone Encounter (Signed)
Please start Kevzara BIV.  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

## 2020-10-20 NOTE — Telephone Encounter (Signed)
Submitted a Prior Authorization request to [facility] for [facility] via Cover My Meds. Will update once we receive a response.   Key: [id number] - PA Case ID: [id number]

## 2020-10-21 ENCOUNTER — Ambulatory Visit: Payer: No Typology Code available for payment source | Admitting: Pharmacist

## 2020-10-21 NOTE — Telephone Encounter (Signed)
Received notification from [facility] regarding a prior authorization for [facility]. Authorization has been APPROVED from 10/20/20 to 10/20/21.   Patient must fill through [facility] [phone])  Patient scheduled for Kevzara new start 10/25/20 @ 10am. Last Rinvoq dose was yesterday  Authorization # [id number] Phone # [phone]

## 2020-10-22 LAB — COMPLETE METABOLIC PANEL WITH GFR
AG Ratio: 1.8 (calc) (ref 1.0–2.5)
ALT: 25 U/L (ref 6–29)
AST: 35 U/L (ref 10–35)
Albumin: 4.3 g/dL (ref 3.6–5.1)
Alkaline phosphatase (APISO): 51 U/L (ref 37–153)
BUN: 16 mg/dL (ref 7–25)
CO2: 26 mmol/L (ref 20–32)
Calcium: 9.8 mg/dL (ref 8.6–10.4)
Chloride: 105 mmol/L (ref 98–110)
Creat: 0.73 mg/dL (ref 0.50–1.05)
GFR, Est African American: 110 mL/min/{1.73_m2} (ref >=60)
GFR, Est Non African American: 95 mL/min/{1.73_m2} (ref >=60)
Globulin: 2.4 g/dL (calc) (ref 1.9–3.7)
Glucose, Bld: 87 mg/dL (ref 65–99)
Potassium: 4.6 mmol/L (ref 3.5–5.3)
Sodium: 141 mmol/L (ref 135–146)
Total Bilirubin: 0.5 mg/dL (ref 0.2–1.2)
Total Protein: 6.7 g/dL (ref 6.1–8.1)

## 2020-10-22 LAB — QUANTIFERON-TB GOLD PLUS
Mitogen-NIL: >10 IU/mL
NIL: 0.06 IU/mL
QuantiFERON-TB Gold Plus: NEGATIVE
TB1-NIL: <0 IU/mL
TB2-NIL: <0 IU/mL

## 2020-10-22 LAB — CBC WITH DIFFERENTIAL/PLATELET
Absolute Monocytes: 451 cells/uL (ref 200–950)
Basophils Absolute: 37 cells/uL (ref 0–200)
Basophils Relative: 0.5 %
Eosinophils Absolute: 30 cells/uL (ref 15–500)
Eosinophils Relative: 0.4 %
HCT: 39.1 % (ref 35.0–45.0)
Hemoglobin: 13.3 g/dL (ref 11.7–15.5)
Lymphs Abs: 2013 cells/uL (ref 850–3900)
MCH: 28.3 pg (ref 27.0–33.0)
MCHC: 34 g/dL (ref 32.0–36.0)
MCV: 83.2 fL (ref 80.0–100.0)
MPV: 11.2 fL (ref 7.5–12.5)
Monocytes Relative: 6.1 %
Neutro Abs: 4869 cells/uL (ref 1500–7800)
Neutrophils Relative %: 65.8 %
Platelets: 306 10*3/uL (ref 140–400)
RBC: 4.7 10*6/uL (ref 3.80–5.10)
RDW: 13.3 % (ref 11.0–15.0)
Total Lymphocyte: 27.2 %
WBC: 7.4 10*3/uL (ref 3.8–10.8)

## 2020-10-22 LAB — LIPID PANEL
Cholesterol: 164 mg/dL (ref <200)
HDL: 78 mg/dL (ref >=50)
LDL Cholesterol (Calc): 72 mg/dL (calc)
Non-HDL Cholesterol (Calc): 86 mg/dL (calc) (ref <130)
Total CHOL/HDL Ratio: 2.1 (calc) (ref <5.0)
Triglycerides: 67 mg/dL (ref <150)

## 2020-10-22 NOTE — Telephone Encounter (Signed)
Kevzara Copay card:  [name]- [id number] PCN- [employer] ID- [id number] Group- [id number]

## 2020-10-23 NOTE — Progress Notes (Signed)
TB gold is negative.

## 2020-10-25 ENCOUNTER — Other Ambulatory Visit: Payer: Self-pay

## 2020-10-25 ENCOUNTER — Ambulatory Visit: Payer: No Typology Code available for payment source | Admitting: Pharmacist

## 2020-10-25 DIAGNOSIS — M0609 Rheumatoid arthritis without rheumatoid factor, multiple sites: Secondary | ICD-10-CM

## 2020-10-25 MED ORDER — KEVZARA 200 MG/1.14ML ~~LOC~~ SOAJ: 200.0000 mg | SUBCUTANEOUS | 2 refills | Status: DC

## 2020-10-25 NOTE — Patient Instructions (Addendum)
Your Kevzara dose will be: 200mg  (1 pen) every 2 weeks. Your next doses are due: 11/08/20 (use sample provided for this dose),  11/23/19, 12/06/20, then every 2 weeks thereafter.  The medication will come from [facility]. Their phone number is [phone]. Your copay should be affordable. If you call the pharmacy and it is not, please make sure they are billing through your copay card as secondary.  The copay card information is as follows:  BIN: [id number] PCN: [employer] ID: [id number] Group: [id number]  Standing Labs We placed an order today for your standing lab work.   Please have your standing labs drawn in 1 month then every 3 months.  If possible, please have your labs drawn 2 weeks prior to your appointment so that the provider can discuss your results at your appointment.  We have open lab daily Monday through Thursday from 1:30-4:30 PM and Friday from 1:30-4:00 PM at the office of Dr. Bo Merino, [facility].   Please be advised, all patients with office appointments requiring lab work will take precedents over walk-in lab work.  If possible, please come for your lab work on Monday and Friday afternoons, as you may experience shorter wait times. The office is located at [address], [place], [place], [place] [address] No appointment is necessary.   Labs are drawn by [facility]. Please bring your co-pay at the time of your lab draw.  You may receive a bill from [facility] for your lab work.  If you wish to have your labs drawn at another location, please call the office 24 hours in advance to send orders.  If you have any questions regarding directions or hours of operation,  please call [phone].   As a reminder, please drink plenty of water prior to coming for your lab work. Thanks!

## 2020-10-25 NOTE — Progress Notes (Signed)
Pharmacy Note  Subjective:   Patient presents to clinic today to receive first dose of Kevzara auto-injector pen. Ms. [name] has a history of rheumatoid arthritis.  Prior therapy includes: methotrexate, Humira, Enbrel, Orencia, most recently Rinvoq in combination with hydroxychloroquine.She has had hives from Floyd (on allergy list). Lack of adequate response to Humira at FDA-approved dosing. Her last Rinvoq dose was yesterday, 10/24/20. Patient is 2-years post-menopausal and is reporting increased bruising as well as hot flashes. She has history of hysterectomy in 2007. Reports that hot flashes had stopped when she had held Rinvoq, and returned when Rinvoq was restarted. She has not yet started estrogen transdermal patches that were prescribed for her systemic post-menopausal symptoms due to current Rinvoq use. She's talked in detail about her options for systemic post-menopausal symptoms with GYN - she has tried non-hormonal options like venlafaxine with no symptom improvement. She reported that the hot flashes are bothersome and affect her quality of life, therefore the decision was made to switch to Bobo.  Patient running a fever or have signs/symptoms of infection? No  Patient currently on antibiotics for the treatment of infection? No  Patient have any upcoming invasive procedures/surgeries? No  Objective: CMP     Component Value Date/Time   NA 141 10/20/2020 1047   NA 137 07/25/2016 0000   K 4.6 10/20/2020 1047   CL 105 10/20/2020 1047   CO2 26 10/20/2020 1047   GLUCOSE 87 10/20/2020 1047   BUN 16 10/20/2020 1047   CREATININE 0.73 10/20/2020 1047   CALCIUM 9.8 10/20/2020 1047   PROT 6.7 10/20/2020 1047   PROT 6.5 07/28/2019 0847   ALBUMIN 4.0 07/27/2020 1015   ALBUMIN 4.5 07/28/2019 0847   AST 35 10/20/2020 1047   ALT 25 10/20/2020 1047   ALKPHOS 54 07/27/2020 1015   BILITOT 0.5 10/20/2020 1047   BILITOT 0.6 07/28/2019 0847   GFRNONAA 95 10/20/2020 1047   GFRAA 110  10/20/2020 1047    CBC    Component Value Date/Time   WBC 7.4 10/20/2020 1047   RBC 4.70 10/20/2020 1047   HGB 13.3 10/20/2020 1047   HGB 12.8 07/27/2020 1038   HGB 12.2 07/05/2020 0936   HCT 39.1 10/20/2020 1047   HCT 35.3 07/05/2020 0936   PLT 306 10/20/2020 1047   PLT 298 07/27/2020 1038   PLT 275 07/05/2020 0936   MCV 83.2 10/20/2020 1047   MCV 88 07/05/2020 0936   MCH 28.3 10/20/2020 1047   MCHC 34.0 10/20/2020 1047   RDW 13.3 10/20/2020 1047   RDW 12.7 07/05/2020 0936   LYMPHSABS 2,013 10/20/2020 1047   LYMPHSABS 1.8 07/05/2020 0936   MONOABS 0.5 07/27/2020 1038   EOSABS 30 10/20/2020 1047   EOSABS 0.2 07/05/2020 0936   BASOSABS 37 10/20/2020 1047   BASOSABS 0.1 07/05/2020 0936    Baseline Immunosuppressant Therapy Labs TB GOLD Quantiferon TB Gold Latest Ref Rng & Units 10/20/2020  Quantiferon TB Gold Plus NEGATIVE NEGATIVE   Hepatitis Panel Hepatitis Latest Ref Rng & Units 10/23/2018  Hep B Surface Ag NON-REACTI NON-REACTIVE  Hep B IgM NON-REACTI NON-REACTIVE  Hep C Ab NON-REACTI NON-REACTIVE  Hep C Ab NON-REACTI NON-REACTIVE  Hep A IgM NON-REACTI NON-REACTIVE   HIV Lab Results  Component Value Date   HIV NON-REACTIVE 10/23/2018   Immunoglobulins Immunoglobulin Electrophoresis Latest Ref Rng & Units 10/23/2018  IgA  47 - 310 mg/dL 195  IgG 600 - 1,640 mg/dL 890  IgM 50 - 300 mg/dL 115   SPEP Serum  Protein Electrophoresis Latest Ref Rng & Units 10/20/2020  Total Protein 6.1 - 8.1 g/dL 6.7  Albumin 3.8 - 4.8 g/dL -  Alpha-1 0.2 - 0.3 g/dL -  Alpha-2 0.5 - 0.9 g/dL -  Beta Globulin 0.4 - 0.6 g/dL -  Beta 2 0.2 - 0.5 g/dL -  Gamma Globulin 0.8 - 1.7 g/dL -   G6PD Lab Results  Component Value Date   G6PDH 16.2 09/27/2018   Chest x-ray: 10/23/2018 - WNL, no cardiopulmonary disease  Immunization History  Administered Date(s) Administered  . Influenza,inj,Quad PF,6+ Mos 06/28/2018, 06/18/2019  . Influenza-Unspecified 01/25/2018, 08/05/2020  .  PFIZER(Purple Top)SARS-COV-2 Vaccination 12/08/2019, 01/05/2020, 08/05/2020  . Pneumococcal Polysaccharide-23 10/22/2018  . Tdap 06/28/2018  . Zoster Recombinat (Shingrix) 04/15/2019, 07/16/2019    Assessment/Plan:  Counseled patient that [name] is an IL-6 blocking agent.  Counseled patient on purpose, proper use, and adverse effects of Kevzara.  Reviewed the most common adverse effects including infections, injection site reaction, bowel injury, and rarely cancer.  Reviewed that the medication should be held during infections.  Discussed that there is the possibility of an increased risk of malignancy but it is not well understood if this increased risk is due to the medication or the disease state.  Counseled patient that [name] should be held prior to scheduled surgery.  Counseled patient to avoid live vaccines while on Kevzara.  Advised patient to get annual influenza vaccine and the pneumococcal vaccine.  Patient UTD on Shingrix. She's received [employer] vaccine including booster dose.  Reviewed the importance of regular labs while on Kevzara therapy including the need for routine lipid panel. Reviewed storage instructions of [name] with patient.  Advised it should be kept in the refrigerator until she is ready for use - discussed that [name] can be left at room temperature for up to 14 days but cannot be returned back into the fridge.  She plans to start estradiol transdermal patches after 3-4 days (based on Rinvoq's half-life of 8-13 hours, would be eliminated 3-4 days max). Has f/u with GYN in 2 months after starting HRT (12/15/20)  Demonstrated proper injection technique with Kevzara demo device  Patient able to demonstrate proper injection technique using the teach back method.  Patient self injected in the left thigh with:  Sample Medication: Kevzara auto-injector 200mg /1.14 ml  NDC: [address]-03 Lot: [id number] Expiration: 01/15/2021  Patient administered one pen from the box of 2.  Provided second pen to take home for dose that is due 11/08/20  Dose: 200mg  SQ every 14 days  Patient tolerated well.  Observed for 30 mins in office for adverse reaction.   Patient is to return in 1 month for labs and 6-8 weeks for follow-up appointment with provider. Nothing scheduled yet, and advised patient to make appointment at [facility].  Standing lab orders already in place.   Kevzara approved through insurance.  Prescription sent to [facility] with copay card information. Patient provided with pharmacy phone number and copay card information. Advised to provide copay card information if pharmacy provides her with unaffordable copay.  Signed consent was sent to scan center on 10/20/20.  All questions encouraged and answered.  Instructed patient to call with any further questions or concerns.  Knox Saliva, PharmD, MPH Clinical Pharmacist (Rheumatology and Pulmonology)  10/25/2020 10:36 AM

## 2020-10-26 ENCOUNTER — Other Ambulatory Visit: Payer: Self-pay

## 2020-10-26 ENCOUNTER — Ambulatory Visit: Payer: No Typology Code available for payment source | Admitting: Neurology

## 2020-10-26 DIAGNOSIS — R292 Abnormal reflex: Secondary | ICD-10-CM | POA: Diagnosis not present

## 2020-10-26 DIAGNOSIS — G2581 Restless legs syndrome: Secondary | ICD-10-CM | POA: Diagnosis not present

## 2020-10-26 DIAGNOSIS — R202 Paresthesia of skin: Secondary | ICD-10-CM

## 2020-10-26 MED ORDER — KEVZARA 200 MG/1.14ML ~~LOC~~ SOSY: PREFILLED_SYRINGE | SUBCUTANEOUS | 1 refills | Status: DC

## 2020-10-26 NOTE — Procedures (Signed)
[facility]  [address]  [place], [place] [address] Tel: [phone] Fax:  [phone] Test Date:  10/26/2020  Patient: [name] DOB: Apr 15, 1966 Physician: Narda Amber, DO  Sex: Female Height: 5\' 7"  Ref Phys: Narda Amber, DO  ID#: [id number]   Technician:    Patient Complaints: This is a 55 year old female referred for evaluation of generalized paresthesias.  NCV & EMG Findings: Extensive electrodiagnostic testing of the right upper and lower extremity shows:  1. All sensory responses including the right median, ulnar, mixed palmar, sural, and superficial peroneal nerves are within normal limits. 2. All motor responses including the right median, ulnar, peroneal, and tibial nerves are within normal limits. 3. Right tibial H reflex study is within normal limits. 4. There is no evidence of active or chronic motor axonal loss changes affecting any of the tested muscles.  Motor unit configuration and recruitment pattern is within normal limits.  Impression: This is a normal study of the right upper and lower extremities.  In particular, there is no evidence of a sensorimotor polyneuropathy, cervical/lumbosacral radiculopathy, or carpal tunnel syndrome.   ___________________________ Narda Amber, DO    Nerve Conduction Studies Anti Sensory Summary Table   Stim Site NR Peak (ms) Norm Peak (ms) P-T Amp (V) Norm P-T Amp  Right Median Anti Sensory (2nd Digit)  34C  Wrist    3.0 <3.6 49.6 >15  Right Sup Peroneal Anti Sensory (Ant Lat Mall)  34C  12 cm    3.3 <4.6 11.6 >4  Right Sural Anti Sensory (Lat Mall)  34C  Calf    3.2 <4.6 27.0 >4  Right Ulnar Anti Sensory (5th Digit)  34C  Wrist    3.0 <3.1 47.1 >10   Motor Summary Table   Stim Site NR Onset (ms) Norm Onset (ms) O-P Amp (mV) Norm O-P Amp Site1 Site2 Delta-0 (ms) Dist (cm) Vel (m/s) Norm Vel (m/s)  Right Median Motor (Abd Poll Brev)  34C  Wrist    2.7 <4.0 9.7 >6 Elbow Wrist 4.9 26.0  53 >50  Elbow    7.6  9.4         Right Peroneal Motor (Ext Dig Brev)  34C  Ankle    4.4 <6.0 6.1 >2.5 B Fib Ankle 8.4 38.0 45 >40  B Fib    12.8  5.5  Poplt B Fib 1.7 9.0 53 >40  Poplt    14.5  5.5         Right Tibial Motor (Abd Hall Brev)  34C  Ankle    4.4 <6.0 7.6 >4 Knee Ankle 8.9 42.0 47 >40  Knee    13.3  5.7         Right Ulnar Motor (Abd Dig Minimi)  34C  Wrist    2.6 <3.1 9.6 >7 B Elbow Wrist 3.3 21.0 64 >50  B Elbow    5.9  8.4  A Elbow B Elbow 1.7 10.0 59 >50  A Elbow    7.6  8.3          Comparison Summary Table   Stim Site NR Peak (ms) Norm Peak (ms) P-T Amp (V) Site1 Site2 Delta-P (ms) Norm Delta (ms)  Right Median/Ulnar Palm Comparison (Wrist - 8cm)  34C  Median Palm    1.5 <2.2 76.0 Median Palm Ulnar Palm 0.1   Ulnar Palm    1.6 <2.2 23.0       H Reflex Studies   NR H-Lat (ms) Lat  Norm (ms) L-R H-Lat (ms)  Right Tibial (Gastroc)  34C     31.97 <35    EMG   Side Muscle Ins Act Fibs Psw Fasc Number Recrt Dur Dur. Amp Amp. Poly Poly. Comment  Right AntTibialis Nml Nml Nml Nml Nml Nml Nml Nml Nml Nml Nml Nml N/A  Right Gastroc Nml Nml Nml Nml Nml Nml Nml Nml Nml Nml Nml Nml N/A  Right Flex Dig Long Nml Nml Nml Nml Nml Nml Nml Nml Nml Nml Nml Nml N/A  Right RectFemoris Nml Nml Nml Nml Nml Nml Nml Nml Nml Nml Nml Nml N/A  Right GluteusMed Nml Nml Nml Nml Nml Nml Nml Nml Nml Nml Nml Nml N/A  Right 1stDorInt Nml Nml Nml Nml Nml Nml Nml Nml Nml Nml Nml Nml N/A  Right PronatorTeres Nml Nml Nml Nml Nml Nml Nml Nml Nml Nml Nml Nml N/A  Right Biceps Nml Nml Nml Nml Nml Nml Nml Nml Nml Nml Nml Nml N/A  Right Triceps Nml Nml Nml Nml Nml Nml Nml Nml Nml Nml Nml Nml N/A  Right Deltoid Nml Nml Nml Nml Nml Nml Nml Nml Nml Nml Nml Nml N/A      Waveforms:

## 2020-10-26 NOTE — Addendum Note (Signed)
Addended by: Cassandria Anger on: 10/26/2020 03:45 PM   Modules accepted: Orders

## 2020-10-26 NOTE — Progress Notes (Signed)
Follow-up Visit   Date: 10/26/20   [name] MRN: [id number] DOB: 1966-08-05   Interim History: [name] is a 55 y.o. right-handed Caucasian female with seronegative rheumatoid arthritis, GERD, migraines returning to the clinic for follow-up of numbness/tingling.  The patient was accompanied to the clinic by self.  History of present illness: Over the past year, she has noticed numbness of the legs, which is very sporadic and usually occurs with walking.  It occurs about 2-3 times per week, lasting anywhere from several minutes to all day.  No alleviating factors.  Other times, she feels tingling in the wrists.  There is no weakness, but if she is exercising and using the treadmill and her leg goes numb, she often stops exercising. She has not suffered any falls.  She also complains of restless leg syndrome and often needs to move her legs at nighttime to get relief.  She takes gabapentin 600mg  at bedtime for hot flashes.  UPDATE 10/26/2020:  She is here for EDX testing.  Her symptoms have not been as noticeable, but she attributes this to not using the treadmill lately.  She had MRI brain in January which was normal. No new complaints.   Medications:  Current Outpatient Medications on File Prior to Visit  Medication Sig Dispense Refill  . acetaminophen (TYLENOL) 500 MG tablet Take 500 mg by mouth every 6 (six) hours as needed.    Marland Kitchen ADVAIR DISKUS 250-50 MCG/DOSE AEPB INHALE 1 PUFF BY MOUTH TWICE A DAY 180 each 3  . albuterol (VENTOLIN HFA) 108 (90 Base) MCG/ACT inhaler TAKE 2 PUFFS BY MOUTH EVERY 6 HOURS AS NEEDED FOR WHEEZE OR SHORTNESS OF BREATH 25.5 each 5  . cholecalciferol (VITAMIN D) 400 units TABS tablet Take 5,000 Units by mouth.     . cyclobenzaprine (FLEXERIL) 10 MG tablet TAKE 1 TABLET BY MOUTH THREE TIMES A DAY AS NEEDED FOR MUSCLE SPASMS 30 tablet 1  . Diclofenac Sodium ([name]) 2 % SOLN Place 1 application onto the skin 2 (two) times daily. (Patient taking  differently: Place 1 application onto the skin as needed.) 112 g 2  . estradiol (VIVELLE-DOT) 0.05 MG/24HR patch Place 1 patch (0.05 mg total) onto the skin 2 (two) times a week. (Patient not taking: Reported on 10/20/2020) 8 patch 2  . fexofenadine-pseudoephedrine (ALLEGRA-D 24) 180-240 MG 24 hr tablet TAKE 1 TABLET BY MOUTH EVERY DAY 30 tablet 4  . gabapentin (NEURONTIN) 800 MG tablet Take 1 tablet (800 mg total) by mouth at bedtime. 90 tablet 1  . hydroxychloroquine (PLAQUENIL) 200 MG tablet TAKE 1 TABLET BY MOUTH TWICE A DAY 180 tablet 0  . levothyroxine (SYNTHROID) 150 MCG tablet TAKE 1 TABLET BY MOUTH EVERY OTHER DAY 45 tablet 3  . montelukast (SINGULAIR) 10 MG tablet TAKE 1 TABLET BY MOUTH EVERYDAY AT BEDTIME 90 tablet 3  . rosuvastatin (CRESTOR) 5 MG tablet TAKE 1/2 TABLET BY MOUTH DAILY 45 tablet 3  . sarilumab (KEVZARA) 200 MG/1.14ML SOSY Inject 200mg  (1 syringe) into the skin every 14 days. Copay card info: BIN- [id number]; PCN- [employer]; ID- [id number]; Group- [id number] 2.4 mL 1  . SYNTHROID 175 MCG tablet Take 175 mcg by mouth every other day.     . venlafaxine (EFFEXOR) 37.5 MG tablet Take 1 tablet (37.5 mg total) by mouth daily. 30 tablet 1   No current facility-administered medications on file prior to visit.    Neurological Exam: Deferred, see note on 09/06/2020  Data: NCS/EMG of the right arm  and leg 10/26/2020:  Normal MRI brain 09/23/2020:  Normal  Lab Results  Component Value Date   VITAMINB12 609 09/06/2020   Lab Results  Component Value Date   FOLATE 6.6 09/06/2020   Lab Results  Component Value Date   TSH 0.77 05/14/2020     IMPRESSION/PLAN: Migratory paresthesias, intermittent.  Normal neurological work-up including labs, MRI brain, and electrodiagnostic testing.    - Reassurance provided that there is no signs of anything worrisome causing these symptoms  - Monitor   Thank you for allowing me to participate in patient's care.  If I can answer any additional  questions, I would be pleased to do so.    Sincerely,    Llewelyn Sheaffer K. Posey Pronto, DO

## 2020-10-26 NOTE — Progress Notes (Signed)
Received fax from [facility] that they do not have any Kevzara pens in stock d/t use for COVID19 treatment. Called patient and advised that pre-filled syringes are available. We discussed that dose would be administered at 45 degree angle, but otherwise storage and dose are the same as the auto-injector pen. Advised that pre-filled syringe doesn't require drawing dose. She verbalized understanding and states that she feels comfortable administering the PFS.  Rx re-sent to [facility] for PFS.  Knox Saliva, PharmD, MPH Clinical Pharmacist (Rheumatology and Pulmonology)

## 2020-11-09 ENCOUNTER — Other Ambulatory Visit: Payer: Self-pay | Admitting: Obstetrics and Gynecology

## 2020-11-11 ENCOUNTER — Other Ambulatory Visit: Payer: Self-pay | Admitting: Family Medicine

## 2020-11-11 NOTE — Telephone Encounter (Signed)
Pharmacy requesting 90 days supply. Dr Quincy Simmonds prescribed #30 x 1 refill on 10/18/20.  At 10/18/20 visit Dr. Quincy Simmonds wrote "Stop Effexor XR and start Effexor 37.5 mg once daily for one month and then try to stop this.  Start Vivelle Dot 0.05 mg twice weekly.  I discussed potential risk of stroke, DVT, PE. Follow up for a recheck in 2 months."

## 2020-11-18 NOTE — Telephone Encounter (Signed)
Mouth sores and bruising are not common side effects with kevzara.  Discussed the situation with Knox Saliva, RPH.   Please advise the patient to discuss painful mouth sores with PCP.    [name] usually take a full 3 months to notice maximum clinical benefit.   She has an updating appointment on 12/14/20 so we can further discuss at that time.

## 2020-11-18 NOTE — Telephone Encounter (Signed)
We will further discuss at her follow up visit.

## 2020-11-30 NOTE — Progress Notes (Signed)
Office Visit Note  Patient: [name]             Date of Birth: 12-27-1965           MRN: [id number]             PCP: Orma Flaming, MD Referring: Orma Flaming, MD Visit Date: 12/14/2020 Occupation: @GUAROCC @  Subjective:  Discuss medications   History of Present Illness: [name] is a 55 y.o. female with history of seronegative rheumatoid arthritis and osteoarthritis.  She is currently on kevzara 200 mg sq injections every 14 days and plaquenil 200 mg 1 tablet by mouth twice daily. She was started on kevzara on 10/25/20.  According to the patient after her third injection she developed oral ulcers and after the fourth injection she developed a mild injection site reaction and bruise.  She has not noticed any clinical improvement since starting on Kevzara.  She has actually noticed increased pain and stiffness in joints that were previously not involved including bilateral SI joints, right knee joint, and right elbow. She has ongoing plantar fasciitis in both feet and has to wear arch supports. She has been experiencing pain, warmth, and stiffness in her right knee joint.  She states that it has been preventing her from performing her normal exercise regimen.  She is also noticed increased weight gain.  She continues to have intermittent pain and stiffness in both ankle joints.  She has ongoing pain in her right wrist which makes it difficult when opening cans and jars.  She is also noticed increased pain in the right elbow joint but denies any joint swelling.  Overall she felt as though written Volk was significantly more effective than Kevzara I would like to restart Rinvoq at this time.  Patient reports that she originally discontinued in both due to excessive bruising but the bruising has continued despite stopping Rinvoq.  She has been having to take Advil several times a day to manage her symptoms.     Activities of Daily Living:  Patient reports morning stiffness for all  day. Patient Reports nocturnal pain.  Difficulty dressing/grooming: Denies Difficulty climbing stairs: Denies Difficulty getting out of chair: Denies Difficulty using hands for taps, buttons, cutlery, and/or writing: Reports  Review of Systems  Constitutional: Positive for fatigue.  HENT: Positive for mouth sores and mouth dryness. Negative for nose dryness.   Eyes: Positive for dryness. Negative for pain and itching.  Respiratory: Negative for shortness of breath and difficulty breathing.   Cardiovascular: Negative for chest pain and palpitations.  Gastrointestinal: Negative for blood in stool, constipation and diarrhea.  Endocrine: Positive for excessive thirst. Negative for increased urination.  Genitourinary: Negative for difficulty urinating.  Musculoskeletal: Positive for arthralgias, joint pain, joint swelling, myalgias, morning stiffness, muscle tenderness and myalgias.  Skin: Negative for color change, rash and redness.  Allergic/Immunologic: Negative for susceptible to infections.  Neurological: Positive for numbness, headaches and weakness. Negative for dizziness and memory loss.  Hematological: Positive for bruising/bleeding tendency.  Psychiatric/Behavioral: Negative for confusion.    PMFS History:  Patient Active Problem List   Diagnosis Date Noted  . Status post surgery 06/14/2020  . Genetic testing 02/18/2020  . Family history of breast cancer   . High risk medication use 10/30/2019  . Left bundle branch block 04/28/2019  . Right wrist pain 03/13/2019  . Hamstring strain, left, initial encounter 02/20/2019  . 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 Primary osteoarthritis of both feet 10/09/2018  . Hx of  migraines 09/27/2018  . History of gastroesophageal reflux (GERD) 09/27/2018  . 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 Moderate persistent asthma 07/05/2018  . Hypothyroid 06/28/2018    Past Medical History:  Diagnosis Date  . Arthritis     ra zero negative  . Asthma    mild/moderate persistent asthma  . Complication of anesthesia    slow to awaken 25 yrs ago  . Elevated liver function tests dr Neil Crouch pcp manages   for last year  . Family history of breast cancer   . Fibroid   . Frequent headaches   . GERD (gastroesophageal reflux disease)   . Gluten intolerance   . H/O seasonal allergies   . History of chicken pox   . History of COVID-19 07/2019   high fever sob, x 7 days all symptoms resolved  . Left bundle branch block 02/2019   Dr.Paula Ross  . Migraines   . PONV (postoperative nausea and vomiting)    ponv with several surgeries, likes scopolamine patch  . Thyroid disease    Multiple benign nodules--thyroid removed    Family History  Problem Relation Age of Onset  . Arthritis Mother   . Cancer Mother 69       breast ca--dec age 3  . Miscarriages / [place] Mother   . Breast cancer Mother 54  . Diabetes Father   . Heart attack Father   . Heart disease Father        pacemaker   . Hyperlipidemia Father   . Hypertension Father   . Stroke Father        x4  . COPD Sister   . Peripheral Artery Disease Sister   . Asthma Brother   . Birth defects Brother   . Cancer Brother        skin  . Hyperlipidemia Brother   . Heart attack Maternal Grandmother   . Heart disease Maternal Grandmother   . Hyperlipidemia Maternal Grandmother   . Stroke Maternal Grandmother   . Alcohol abuse Paternal Grandmother   . Cancer Paternal Grandmother        colon  . Cancer Paternal Grandfather        breast  . Heart attack Paternal Grandfather   . Breast cancer Paternal Grandfather 54  . Alcohol abuse Brother   . Early death Brother        suicide  . Mental illness Brother   . Asthma Daughter   . Asthma Son   . Asthma Son   . Breast cancer Maternal Aunt 60  . Kidney cancer Paternal Aunt 85  . Breast cancer Paternal Aunt 15  . Throat cancer Paternal Uncle   . Thyroid cancer Paternal Uncle   . Breast  cancer Cousin 87  . Prostate cancer Cousin    Past Surgical History:  Procedure Laterality Date  . ANTERIOR AND POSTERIOR REPAIR N/A 06/14/2020   Procedure: ANTERIOR (CYSTOCELE) AND POSTERIOR REPAIR (RECTOCELE);  Surgeon: Nunzio Cobbs, MD;  Location: [facility];  Service: Gynecology;  Laterality: N/A;  . BLADDER SUSPENSION N/A 06/14/2020   Procedure: TRANSVAGINAL TAPE (TVT) PROCEDURE/EXACT MIDURETHRAL SLING;  Surgeon: Nunzio Cobbs, MD;  Location: [facility];  Service: Gynecology;  Laterality: N/A;  EXACT MIDURETHRAL SLING  . CARDIAC CATHETERIZATION  2009   results normal done in [place]  . CHOLECYSTECTOMY  1995   laparoscopic  . CYSTOSCOPY N/A 06/14/2020   Procedure: CYSTOSCOPY;  Surgeon: Yisroel Ramming, Dietrich Pates  E, MD;  Location: [place];  Service: Gynecology;  Laterality: N/A;  . HAMMER TOE SURGERY Bilateral 2009  . HERNIA REPAIR  2015   Has abdominal wall mesh - [place], [place].  Laparoscopic incisional hernia repari with 7 x 9 inch Ventralight ST with Echo mesh  . LAPAROSCOPIC HYSTERECTOMY  2007   Supracervical hysterectomy with cystosctopy - [place], [place] partial  . THYROID SURGERY  2013   total thyroidectomy due to 11 nodules   Social History   Social History Narrative   Right Handed   Lives in a two story home   Immunization History  Administered Date(s) Administered  . Influenza,inj,Quad PF,6+ Mos 06/28/2018, 06/18/2019  . Influenza-Unspecified 01/25/2018, 08/05/2020  . PFIZER(Purple Top)SARS-COV-2 Vaccination 12/08/2019, 01/05/2020, 08/05/2020  . Pneumococcal Polysaccharide-23 10/22/2018  . Tdap 06/28/2018  . Zoster Recombinat (Shingrix) 04/15/2019, 07/16/2019     Objective: Vital Signs: BP 136/82 (BP Location: Left Arm, Patient Position: Sitting, Cuff Size: Normal)   Pulse 89   Resp 14   Ht 5\' 7"  (1.702 m)   Wt 199 lb 12.8 oz (90.6 kg)   LMP  (LMP Unknown)   BMI 31.29 kg/m    Physical  Exam Vitals and nursing note reviewed.  Constitutional:      Appearance: She is well-developed.  HENT:     Head: Normocephalic and atraumatic.  Eyes:     Conjunctiva/sclera: Conjunctivae normal.  Pulmonary:     Effort: Pulmonary effort is normal.  Abdominal:     Palpations: Abdomen is soft.  Musculoskeletal:     Cervical back: Normal range of motion.  Skin:    General: Skin is warm and dry.     Capillary Refill: Capillary refill takes less than 2 seconds.     Comments: Scattered bruising on bilateral lower extremities   Neurological:     Mental Status: She is alert and oriented to person, place, and time.  Psychiatric:        Behavior: Behavior normal.      Musculoskeletal Exam: C-spine, thoracic spine, and lumbar spine good ROM.  No midline spinal tenderness.  Tenderness over both SI joints.  Shoulder joints to motion with no discomfort.  Elbow joints have good range of motion with tenderness over the right lateral epicondyle.  She has tenderness and warmth on the dorsal aspect of the right wrist joint.  Pain and slightly limited extension of the right wrist was noted.  No tenderness or synovitis of MCP or PIP joints.  Mild DIP prominence noted.  Hip joints good ROM with no discomfort.  Pain and stiffness with ROM of the right knee. Left knee joint has good ROM with no warmth or effusion.  Ankle joints good ROM with tenderness over the left ankle.  Plantar fasciitis bilaterally.   CDAI Exam: CDAI Score: 4.2  Patient Global: 6 mm; Provider Global: 6 mm Swollen: 1 ; Tender: 7  Joint Exam 12/14/2020      Right  Left  Wrist  Swollen Tender     DIP 2   Tender     DIP 3   Tender     Sacroiliac   Tender   Tender  Knee   Tender     Ankle      Tender     Investigation: No additional findings.  Imaging: No results found.  Recent Labs: Lab Results  Component Value Date   WBC 6.8 12/13/2020   HGB 14.5 12/13/2020   PLT 249 12/13/2020   NA 138  12/13/2020   K 4.5 12/13/2020    CL 103 12/13/2020   CO2 28 12/13/2020   GLUCOSE 88 12/13/2020   BUN 11 12/13/2020   CREATININE 0.88 12/13/2020   BILITOT 0.9 12/13/2020   ALKPHOS 54 07/27/2020   AST 32 12/13/2020   ALT 33 (H) 12/13/2020   PROT 6.8 12/13/2020   ALBUMIN 4.0 07/27/2020   CALCIUM 9.4 12/13/2020   GFRAA 88 12/13/2020   QFTBGOLDPLUS NEGATIVE 10/20/2020    Speciality Comments: PLQ Eye Exam: 05/10/2020 WNL [facility] Follow up in 1 year.   Inadequate response to methotrexate, Enbrel, Humira, Orencia-hives everywhere  Procedures:  No procedures performed Allergies: Orencia [abatacept], Shellfish allergy, Strawberry (diagnostic), Gluten meal, Lortab [hydrocodone-acetaminophen], Morphine and related, and Augmentin [amoxicillin-pot clavulanate]   Assessment / Plan:     Visit Diagnoses: Rheumatoid arthritis of multiple sites with negative rheumatoid factor ([place]) - Diagnosed at [place] [facility]. The ultrasound examination performed showed synovitis in bilateral hands: She presents today with pain and stiffness in multiple joints including the right elbow, right wrist, right knee joint, and bilateral SI joints. She has tenderness and warmth on the dorsal aspect of the right wrist. She is currently on Kevzara 200 mg subcutaneous injections every 14 days and Plaquenil 200 mg 1 tablet by mouth twice daily.  She was started on Xarelto on 10/25/2020 and has not noticed any clinical improvement and has actually noticed new joint involvement.  She has been having difficulty keeping up with her exercise regimen due to the severity of pain and stiffness.  She has been having to take Advil on a daily basis for pain relief.  Prior to starting on Kevzara she was taking Rinvoq which she found to be very effective at managing her rheumatoid arthritis.  She was off of rinvoq for several months in the fall 2021 due to undergoing bladder sling surgery, and she noticed increased joint pain and inflammation while  off of therapy.  While on Rinvoq she noticed excessive bruising which was the main motivating factor for switching to [place].  She has had ongoing bruising despite discontinuing Rinvoq.  She would like to try restarting on Rinvoq.  We will apply for Rinvoq through her insurance.  Her next dose of [name] is due on 12/20/20 which she was advised to avoid injecting and to instead restart taking rinvoq 15 mg 1 tablet by mouth daily. She has some leftover rinvoq tablets at home, which she will start taking while awaiting approval and a refill of rinvoq.  She will continue on plaquenil as prescribed.  She will follow up in 2-3 months to assess her response to rinvoq.    Counseled patient that Rinvoq is a JAK inhibitor indicated for Rheumatoid Arthritis.  Counseled patient on purpose, proper use, and adverse effects of Rinvoq.    Reviewed the most common adverse effects including infection, diarrhea, headaches.  Also reviewed rare adverse effects such as bowel injury and the need to contact us if they develop stomach pain during treatment. Counseled on the increase risk of venous thrombosis. Reviewed with patient that there is the possibility of an increased risk of malignancy but it is not well understood if this increased risk is due to the medication or the disease state.  Instructed patient that medication should be held for infection and prior to surgery.  Advised patient to avoid live vaccines. Recommend annual influenza, Pneumovax 23, Prevnar 13, and Shingrix as indicated.   Reviewed importance of routine lab monitoring including  lipid panel.  Standing orders placed. Provided patient with medication education material and answered all questions.  Patient consented to Rinvoq.  Will upload into patient's chart.  Will apply through patient's insurance and update when we receive a response.    Patient dose will be 15 mg daily.  Prescription will be sent to pharmacy pending lab results and insurance  approval.  High risk medication use - She would like to reapply for rinvoq 15 mg 1 tablet by mouth daily and continue taking Plaquenil 200 mg 1 tablet by mouth twice daily.   Prior therapy includes: methotrexate, Humira, Enbrel, and Orencia. She has had hives from [place] (on allergy list). Lack of adequate response to Humira at FDA-approved dosing. D/c Rinvoq prior to switching to kevzara due to excessive bruising. The bruising has persisted despite discontinuing Rinvoq.  Rinvoq was more effective than kevzara has been.  She will discontinue kevzara.   Dry scalp: According to the patient she has been told by her hairstylist that she has small patches of psoriasis on her scalp.  She has no patches of psoriasis anywhere else on her skin.  She has no family history of psoriasis.  She was advised to schedule an appointment with her dermatologist for further evaluation and to confirm if she has scalp psoriasis. We discussed that she has been on several medications including methotrexate, Humira, Enbrel, and Orencia which are all indicated for both rheumatoid arthritis and psoriatic arthritis.  Considering she has seronegative rheumatoid arthritis it does raise the concern for possible psoriatic arthritis that has been masked with the above therapies.  When switching from Enbrel to [facility] she noticed new onset bilateral SI joint pain, plantar fasciitis in both feet, lateral epicondylitis of the right elbow, and increased pain and stiffness in the right knee joint.  She will be restarting on Rinvoq which is indicated for RA and PsA.  She was advised to notify us if her dermatologist diagnoses her with psoriasis.    Lateral epicondylitis, right elbow: She presents today with increased pain and stiffness in the right elbow.  She has no elbow joint line tenderness or inflammation.  No olecranon bursitis noted.  She has tenderness to palpation over the lateral epicondyle of the right elbow.    Primary  osteoarthritis of both feet: She is not experiencing any joint pain in her feet at this time.  She has good ROM of both ankle joints with no tenderness or inflammation.  She has ongoing plantar fasciitis in both feet. She has been wearing shoes with good arch support.    Trochanteric bursitis of both hips: She experiences intermittent discomfort and tenderness.  She has some tenderness over the right trochanteric bursa on exam today.  Bruising -She has ongoing scattered bruising on bilateral lower extremities.  She has very superficial veins.  She was also evaluated by hematology and all the work-up was negative.  Platelet count was 249 on 12/13/2020.  Initially the patient attributed the bruising to starting on Rinvoq but since discontinuing she has had persistent bruising. She has been taking Advil several times per day as well as using Voltaren gel topically as needed for pain relief which may be contributing to some of the bruising she has been experiencing.  Other medical conditions are listed as follows:   History of asthma  History of hypothyroidism  History of gastroesophageal reflux (GERD)  Hx of migraines  Decreased cardiac ejection fraction  Hot flashes  Orders: No orders of the defined types were  placed in this encounter.  No orders of the defined types were placed in this encounter.    Follow-Up Instructions: Return in about 2 months (around 02/13/2021) for Rheumatoid arthritis, Osteoarthritis.   Ofilia Neas, PA-C  Note - This record has been created using Dragon software.  Chart creation errors have been sought, but may not always  have been located. Such creation errors do not reflect on  the standard of medical care.

## 2020-12-03 ENCOUNTER — Other Ambulatory Visit: Payer: Self-pay | Admitting: Obstetrics and Gynecology

## 2020-12-03 NOTE — Telephone Encounter (Signed)
Left message to call triage at [facility], [phone].  Per review of 10/18/20 AEX notes, patient was advised to stop Effexor XR and start Effexor 37.5mg  for 1 month then stop. She is scheduled for a f/u on 12/15/20 w/ Dr. Quincy Simmonds.   Did patient stop medication? Is refill needed?

## 2020-12-07 ENCOUNTER — Inpatient Hospital Stay: Payer: No Typology Code available for payment source | Admitting: Oncology

## 2020-12-07 ENCOUNTER — Telehealth: Payer: Self-pay

## 2020-12-07 NOTE — Telephone Encounter (Signed)
Called patient to see if she was coming to her 9:30 appointment with Dr. Alen Blew today. Per patient she called and left a message with the scheduling department on yesterday to cancel today's appointment. Patient does not want to reschedule. Dr. Alen Blew made aware.

## 2020-12-08 ENCOUNTER — Other Ambulatory Visit: Payer: Self-pay | Admitting: Rheumatology

## 2020-12-08 DIAGNOSIS — M1230 Palindromic rheumatism, unspecified site: Secondary | ICD-10-CM

## 2020-12-08 NOTE — Telephone Encounter (Signed)
Last Visit: 10/20/2020 Next Visit: 12/14/2020 Labs: 10/20/2020, CBC and CMP WNL. Lipid panel WNL. Eye exam: 05/10/2020  Current Dose per office note 10/20/2020,  Plaquenil 200 mg 1 tab BID, DX:  Rheumatoid arthritis of multiple sites with negative rheumatoid factor   Last Fill: 09/05/2020  Okay to refill Plaquenil?

## 2020-12-13 ENCOUNTER — Other Ambulatory Visit: Payer: Self-pay | Admitting: *Deleted

## 2020-12-13 DIAGNOSIS — Z79899 Other long term (current) drug therapy: Secondary | ICD-10-CM

## 2020-12-13 DIAGNOSIS — M0609 Rheumatoid arthritis without rheumatoid factor, multiple sites: Secondary | ICD-10-CM

## 2020-12-14 ENCOUNTER — Ambulatory Visit: Payer: No Typology Code available for payment source | Admitting: Physician Assistant

## 2020-12-14 ENCOUNTER — Telehealth: Payer: Self-pay

## 2020-12-14 ENCOUNTER — Encounter: Payer: Self-pay | Admitting: Physician Assistant

## 2020-12-14 ENCOUNTER — Other Ambulatory Visit: Payer: Self-pay

## 2020-12-14 VITALS — BP 136/82 | HR 89 | Resp 14 | Ht 67.0 in | Wt 199.8 lb

## 2020-12-14 DIAGNOSIS — Z79899 Other long term (current) drug therapy: Secondary | ICD-10-CM | POA: Diagnosis not present

## 2020-12-14 DIAGNOSIS — Z8639 Personal history of other endocrine, nutritional and metabolic disease: Secondary | ICD-10-CM

## 2020-12-14 DIAGNOSIS — M19071 Primary osteoarthritis, right ankle and foot: Secondary | ICD-10-CM

## 2020-12-14 DIAGNOSIS — M0609 Rheumatoid arthritis without rheumatoid factor, multiple sites: Secondary | ICD-10-CM | POA: Diagnosis not present

## 2020-12-14 DIAGNOSIS — Z8669 Personal history of other diseases of the nervous system and sense organs: Secondary | ICD-10-CM

## 2020-12-14 DIAGNOSIS — M7061 Trochanteric bursitis, right hip: Secondary | ICD-10-CM

## 2020-12-14 DIAGNOSIS — R931 Abnormal findings on diagnostic imaging of heart and coronary circulation: Secondary | ICD-10-CM

## 2020-12-14 DIAGNOSIS — M19072 Primary osteoarthritis, left ankle and foot: Secondary | ICD-10-CM

## 2020-12-14 DIAGNOSIS — Z8709 Personal history of other diseases of the respiratory system: Secondary | ICD-10-CM

## 2020-12-14 DIAGNOSIS — T148XXA Other injury of unspecified body region, initial encounter: Secondary | ICD-10-CM

## 2020-12-14 DIAGNOSIS — R238 Other skin changes: Secondary | ICD-10-CM

## 2020-12-14 DIAGNOSIS — M7062 Trochanteric bursitis, left hip: Secondary | ICD-10-CM

## 2020-12-14 DIAGNOSIS — R232 Flushing: Secondary | ICD-10-CM

## 2020-12-14 DIAGNOSIS — M7711 Lateral epicondylitis, right elbow: Secondary | ICD-10-CM

## 2020-12-14 DIAGNOSIS — Z8719 Personal history of other diseases of the digestive system: Secondary | ICD-10-CM

## 2020-12-14 LAB — COMPLETE METABOLIC PANEL WITH GFR
AG Ratio: 2 (calc) (ref 1.0–2.5)
ALT: 33 U/L — ABNORMAL HIGH (ref 6–29)
AST: 32 U/L (ref 10–35)
Albumin: 4.5 g/dL (ref 3.6–5.1)
Alkaline phosphatase (APISO): 47 U/L (ref 37–153)
BUN: 11 mg/dL (ref 7–25)
CO2: 28 mmol/L (ref 20–32)
Calcium: 9.4 mg/dL (ref 8.6–10.4)
Chloride: 103 mmol/L (ref 98–110)
Creat: 0.88 mg/dL (ref 0.50–1.05)
GFR, Est African American: 88 mL/min/{1.73_m2} (ref >=60)
GFR, Est Non African American: 76 mL/min/{1.73_m2} (ref >=60)
Globulin: 2.3 g/dL (calc) (ref 1.9–3.7)
Glucose, Bld: 88 mg/dL (ref 65–99)
Potassium: 4.5 mmol/L (ref 3.5–5.3)
Sodium: 138 mmol/L (ref 135–146)
Total Bilirubin: 0.9 mg/dL (ref 0.2–1.2)
Total Protein: 6.8 g/dL (ref 6.1–8.1)

## 2020-12-14 LAB — CBC WITH DIFFERENTIAL/PLATELET
Absolute Monocytes: 598 cells/uL (ref 200–950)
Basophils Absolute: 61 cells/uL (ref 0–200)
Basophils Relative: 0.9 %
Eosinophils Absolute: 88 cells/uL (ref 15–500)
Eosinophils Relative: 1.3 %
HCT: 42.9 % (ref 35.0–45.0)
Hemoglobin: 14.5 g/dL (ref 11.7–15.5)
Lymphs Abs: 1673 cells/uL (ref 850–3900)
MCH: 29.1 pg (ref 27.0–33.0)
MCHC: 33.8 g/dL (ref 32.0–36.0)
MCV: 86.1 fL (ref 80.0–100.0)
MPV: 11.7 fL (ref 7.5–12.5)
Monocytes Relative: 8.8 %
Neutro Abs: 4379 cells/uL (ref 1500–7800)
Neutrophils Relative %: 64.4 %
Platelets: 249 10*3/uL (ref 140–400)
RBC: 4.98 10*6/uL (ref 3.80–5.10)
RDW: 13.8 % (ref 11.0–15.0)
Total Lymphocyte: 24.6 %
WBC: 6.8 10*3/uL (ref 3.8–10.8)

## 2020-12-14 NOTE — Telephone Encounter (Signed)
Please apply for rinvoq per Hazel Sams, PA-C.   Patient has previous taken rinvoq. Thanks!

## 2020-12-14 NOTE — Patient Instructions (Signed)
Standing Labs We placed an order today for your standing lab work.   Please have your standing labs drawn in June and every 3 months   If possible, please have your labs drawn 2 weeks prior to your appointment so that the provider can discuss your results at your appointment.  We have open lab daily Monday through Thursday from 1:30-4:30 PM and Friday from 1:30-4:00 PM at the office of Dr. Shaili Deveshwar, [facility].   Please be advised, all patients with office appointments requiring lab work will take precedents over walk-in lab work.  If possible, please come for your lab work on Monday and Friday afternoons, as you may experience shorter wait times. The office is located at [address], [address], [place], [place] [address] No appointment is necessary.   Labs are drawn by [facility]. Please bring your co-pay at the time of your lab draw.  You may receive a bill from [facility] for your lab work.  If you wish to have your labs drawn at another location, please call the office 24 hours in advance to send orders.  If you have any questions regarding directions or hours of operation,  please call [phone].   As a reminder, please drink plenty of water prior to coming for your lab work. Thanks!   

## 2020-12-14 NOTE — Telephone Encounter (Signed)
Submitted a Prior Authorization request to [facility] for [employer] [place] via Cover My Meds. Will update once we receive a response.   (KeyLavella Hammock) - [id number]

## 2020-12-15 ENCOUNTER — Encounter: Payer: Self-pay | Admitting: Obstetrics and Gynecology

## 2020-12-15 ENCOUNTER — Other Ambulatory Visit: Payer: Self-pay

## 2020-12-15 ENCOUNTER — Ambulatory Visit: Payer: No Typology Code available for payment source | Admitting: Obstetrics and Gynecology

## 2020-12-15 VITALS — BP 110/80 | HR 80 | Ht 65.25 in | Wt 200.0 lb

## 2020-12-15 DIAGNOSIS — N951 Menopausal and female climacteric states: Secondary | ICD-10-CM

## 2020-12-15 DIAGNOSIS — Z5181 Encounter for therapeutic drug level monitoring: Secondary | ICD-10-CM

## 2020-12-15 MED ORDER — VENLAFAXINE HCL 37.5 MG PO TABS: 37.5000 mg | ORAL_TABLET | Freq: Two times a day (BID) | ORAL | 8 refills | Status: DC

## 2020-12-15 NOTE — Telephone Encounter (Signed)
Left VM for patient regarding Rinvoq approal. She will need to start it when her next Kevzara dose would be due

## 2020-12-15 NOTE — Telephone Encounter (Signed)
Received notification from [facility] regarding a prior authorization for [name]. Authorization has been APPROVED from 12/14/20 to 12/14/21.   Authorization # [id number]  Patient must fill through [place] card:  Card: [id number]  Rx GROUP: [id number]  Rx BIN: [id number]  Rx PCN: [id number]

## 2020-12-15 NOTE — Progress Notes (Signed)
GYNECOLOGY  VISIT   HPI: 55 y.o.   Married  Caucasian  female   [phone] with No LMP recorded (lmp unknown). Patient has had a hysterectomy.   here for 2 month medication follow up.  Patient stopped Vivelle Dot did not help with hot flashes.  Still on Effexor and Neurontin.  Last dose of Effexor is today.  She is weaning off the Effexor and is taking in in the am only and not bid. This was to help her stop the Effexor XR.  She had better control of hot flashes during the day on the Effexor 37.5 mg q am but has increased hot flashes at night.   Stopped her Rinvoq, but will restart this.   Bought a house in [place].   GYNECOLOGIC HISTORY: No LMP recorded (lmp unknown). Patient has had a hysterectomy. Contraception: Hyst Menopausal hormone therapy:  None Last mammogram:  11-21-19 3D/Neg/BiRads1 Last pap smear: 10-16-19 Neg:Neg HR HPV        OB History    Gravida  5   Para  3   Term      Preterm      AB  2   Living  3     SAB  2   IAB      Ectopic      Multiple      Live Births                 Patient Active Problem List   Diagnosis Date Noted  . Status post surgery 06/14/2020  . Genetic testing 02/18/2020  . Family history of breast cancer   . High risk medication use 10/30/2019  . Left bundle branch block 04/28/2019  . Right wrist pain 03/13/2019  . Hamstring strain, left, initial encounter 02/20/2019  . 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 Primary osteoarthritis of both feet 10/09/2018  . Hx of migraines 09/27/2018  . History of gastroesophageal reflux (GERD) 09/27/2018  . 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 Moderate persistent asthma 07/05/2018  . Hypothyroid 06/28/2018    Past Medical History:  Diagnosis Date  . Arthritis    ra zero negative  . Asthma    mild/moderate persistent asthma  . Complication of anesthesia    slow to awaken 25 yrs ago  . Elevated liver function tests dr Neil Crouch pcp manages   for  last year  . Family history of breast cancer   . Fibroid   . Frequent headaches   . GERD (gastroesophageal reflux disease)   . Gluten intolerance   . H/O seasonal allergies   . History of chicken pox   . History of COVID-19 07/2019   high fever sob, x 7 days all symptoms resolved  . Left bundle branch block 02/2019   Dr.Paula Ross  . Migraines   . PONV (postoperative nausea and vomiting)    ponv with several surgeries, likes scopolamine patch  . Thyroid disease    Multiple benign nodules--thyroid removed    Past Surgical History:  Procedure Laterality Date  . ANTERIOR AND POSTERIOR REPAIR N/A 06/14/2020   Procedure: ANTERIOR (CYSTOCELE) AND POSTERIOR REPAIR (RECTOCELE);  Surgeon: Nunzio Cobbs, MD;  Location: [facility];  Service: Gynecology;  Laterality: N/A;  . BLADDER SUSPENSION N/A 06/14/2020   Procedure: TRANSVAGINAL TAPE (TVT) PROCEDURE/EXACT MIDURETHRAL SLING;  Surgeon: Nunzio Cobbs, MD;  Location: [facility];  Service: Gynecology;  Laterality: N/A;  EXACT MIDURETHRAL SLING  .  CARDIAC CATHETERIZATION  2009   results normal done in [place]  . CHOLECYSTECTOMY  1995   laparoscopic  . CYSTOSCOPY N/A 06/14/2020   Procedure: CYSTOSCOPY;  Surgeon: Nunzio Cobbs, MD;  Location: [facility];  Service: Gynecology;  Laterality: N/A;  . HAMMER TOE SURGERY Bilateral 2009  . HERNIA REPAIR  2015   Has abdominal wall mesh - [place], [place].  Laparoscopic incisional hernia repari with 7 x 9 inch Ventralight ST with Echo mesh  . LAPAROSCOPIC HYSTERECTOMY  2007   Supracervical hysterectomy with cystosctopy - [place], [place] partial  . THYROID SURGERY  2013   total thyroidectomy due to 11 nodules    Current Outpatient Medications  Medication Sig Dispense Refill  . acetaminophen (TYLENOL) 500 MG tablet Take 500 mg by mouth every 6 (six) hours as needed.    Marland Kitchen ADVAIR DISKUS 250-50 MCG/DOSE AEPB INHALE 1 PUFF BY  MOUTH TWICE A DAY 180 each 3  . albuterol (VENTOLIN HFA) 108 (90 Base) MCG/ACT inhaler TAKE 2 PUFFS BY MOUTH EVERY 6 HOURS AS NEEDED FOR WHEEZE OR SHORTNESS OF BREATH 25.5 each 5  . cholecalciferol (VITAMIN D) 400 units TABS tablet Take 5,000 Units by mouth.     . cyclobenzaprine (FLEXERIL) 10 MG tablet TAKE 1 TABLET BY MOUTH THREE TIMES A DAY AS NEEDED FOR MUSCLE SPASMS 30 tablet 1  . Diclofenac Sodium ([name]) 2 % SOLN Place 1 application onto the skin 2 (two) times daily. (Patient taking differently: Place 1 application onto the skin as needed.) 112 g 2  . fexofenadine-pseudoephedrine (ALLEGRA-D 24) 180-240 MG 24 hr tablet TAKE 1 TABLET BY MOUTH EVERY DAY 30 tablet 4  . gabapentin (NEURONTIN) 800 MG tablet Take 1 tablet (800 mg total) by mouth at bedtime. 90 tablet 1  . hydroxychloroquine (PLAQUENIL) 200 MG tablet TAKE 1 TABLET BY MOUTH TWICE A DAY 180 tablet 0  . levothyroxine (SYNTHROID) 150 MCG tablet TAKE 1 TABLET BY MOUTH EVERY OTHER DAY 45 tablet 3  . levothyroxine (SYNTHROID) 175 MCG tablet TAKE 1 TABLET BY MOUTH EVERY DAY BEFORE BREAKFAST 90 tablet 2  . montelukast (SINGULAIR) 10 MG tablet TAKE 1 TABLET BY MOUTH EVERYDAY AT BEDTIME 90 tablet 3  . rosuvastatin (CRESTOR) 5 MG tablet TAKE 1/2 TABLET BY MOUTH DAILY 45 tablet 3  . venlafaxine (EFFEXOR) 37.5 MG tablet TAKE 1 TABLET BY MOUTH EVERY DAY 30 tablet 1   No current facility-administered medications for this visit.     ALLERGIES: Orencia [abatacept], Shellfish allergy, Strawberry (diagnostic), Gluten meal, Lortab [hydrocodone-acetaminophen], Morphine and related, and Augmentin [amoxicillin-pot clavulanate]  Family History  Problem Relation Age of Onset  . Arthritis Mother   . Cancer Mother 66       breast ca--dec age 42  . Miscarriages / [place] Mother   . Breast cancer Mother 17  . Diabetes Father   . Heart attack Father   . Heart disease Father        pacemaker   . Hyperlipidemia Father   . Hypertension Father    . Stroke Father        x4  . COPD Sister   . Peripheral Artery Disease Sister   . Asthma Brother   . Birth defects Brother   . Cancer Brother        skin  . Hyperlipidemia Brother   . Heart attack Maternal Grandmother   . Heart disease Maternal Grandmother   . Hyperlipidemia Maternal Grandmother   . Stroke Maternal Grandmother   .  Alcohol abuse Paternal Grandmother   . Cancer Paternal Grandmother        colon  . Cancer Paternal Grandfather        breast  . Heart attack Paternal Grandfather   . Breast cancer Paternal Grandfather 11  . Alcohol abuse Brother   . Early death Brother        suicide  . Mental illness Brother   . Asthma Daughter   . Asthma Son   . Asthma Son   . Breast cancer Maternal Aunt 60  . Kidney cancer Paternal Aunt 85  . Breast cancer Paternal Aunt 17  . Throat cancer Paternal Uncle   . Thyroid cancer Paternal Uncle   . Breast cancer Cousin 96  . Prostate cancer Cousin     Social History   Socioeconomic History  . Marital status: Married    Spouse name: Not on file  . Number of children: 3  . Years of education: Not on file  . Highest education level: Not on file  Occupational History  . Not on file  Tobacco Use  . Smoking status: Never Smoker  . Smokeless tobacco: Never Used  Vaping Use  . Vaping Use: Never used  Substance and Sexual Activity  . Alcohol use: Yes    Comment: 2 glasses of wine/month  . Drug use: Never  . Sexual activity: Yes    Partners: Male    Birth control/protection: Surgical    Comment: Hyst-ovaries remain  Other Topics Concern  . Not on file  Social History Narrative   Right Handed   Lives in a two story home   Social Determinants of Health   Financial Resource Strain: Not on file  Food Insecurity: Not on file  Transportation Needs: Not on file  Physical Activity: Not on file  Stress: Not on file  Social Connections: Not on file  Intimate Partner Violence: Not on file    Review of Systems  All other  systems reviewed and are negative.   PHYSICAL EXAMINATION:    BP 110/80   Pulse 80   Ht 5' 5.25" (1.657 m)   Wt 200 lb (90.7 kg)   LMP  (LMP Unknown)   SpO2 100%   BMI 33.03 kg/m     General appearance: alert, cooperative and appears stated age   ASSESSMENT  Menopausal symptoms.  On Effexor 37.5 mg daily.  On Neurontin.  Off transdermal estrogen. Family history of breast cancer.  PLAN  Will increase to Effexor 37.5 mg bid.  #60, FU 8 She will contact me if the Effexor is not working well for her.  She declined the option of increasing dose of a transdermal estrogen patch or taking the estrogen orally.  Fu for annual exam and prn.    26 min  total time was spent for this patient encounter, including preparation, face-to-face counseling with the patient, coordination of care, and documentation of the encounter.

## 2020-12-16 MED ORDER — PREDNISONE 5 MG PO TABS: ORAL_TABLET | ORAL | 0 refills | Status: DC

## 2020-12-16 MED ORDER — RINVOQ 15 MG PO TB24: 15.0000 mg | ORAL_TABLET | Freq: Every day | ORAL | 3 refills | Status: DC

## 2020-12-16 NOTE — Telephone Encounter (Signed)
Spoke with patient and advised that she can start Rinvoq when her [name] would be. Sent MyChart message with Rinvoq copay card information. Rx for Rinvoq sent to [facility].  She requested meloxicam for pain relief, states she is unable to turn side to side. After conversations with Hazel Sams, we decided on prednisone pack for relief d/t patient's LFTs that are elevated.  Knox Saliva, PharmD, MPH Clinical Pharmacist (Rheumatology and Pulmonology)

## 2020-12-18 ENCOUNTER — Other Ambulatory Visit: Payer: Self-pay | Admitting: Family Medicine

## 2020-12-18 ENCOUNTER — Encounter: Payer: Self-pay | Admitting: Family Medicine

## 2020-12-20 ENCOUNTER — Other Ambulatory Visit: Payer: Self-pay

## 2021-01-26 NOTE — Progress Notes (Signed)
Office Visit Note  Patient: [name]             Date of Birth: 08-03-1966           MRN: [id number]             PCP: Orma Flaming, MD Referring: Orma Flaming, MD Visit Date: 02/08/2021 Occupation: @GUAROCC @  Subjective:  Medication monitoring   History of Present Illness: [name] is a 55 y.o. female with history of seronegative rheumatoid arthritis.  She is taking rinvoq 15 mg 1 tablet by mouth daily and plaquenil 200 mg 1 tablet by mouth twice daily.  She restarted rinvoq about 7-8 weeks ago and has been tolerating it without any side effects. She continues to have frequent bruising but has not noticed any worsening of symptoms.  She denies any recent rashes.  She states she has noticed significant clinical improvement since restarting rinvoq.  She is off of prednisone and not taking any OTC products for pain relief at this time. She denies any SI joint discomfort at this time.  She has intermittent pain and stiffness in the right wrist joint but she has been able to practice yoga.  She has been able to continue her exercise regimen.  She denies any achilles tendonitis or plantar fasciitis at this time.  She denies any recent infections.  She states over the past 5-6 weeks she has noticed excessive thirst and increased urination.  She has been waking up in the night to drink 2 water bottles.  She has also noticed ongoing weight gain despite no change in her diet or exercise regimen.       Activities of Daily Living:  Patient reports morning stiffness for 30  minutes.   Patient Denies nocturnal pain.  Difficulty dressing/grooming: Denies Difficulty climbing stairs: Denies Difficulty getting out of chair: Denies Difficulty using hands for taps, buttons, cutlery, and/or writing: Reports  Review of Systems  Constitutional: Negative for fatigue.  HENT: Positive for mouth dryness. Negative for mouth sores and nose dryness.   Eyes: Positive for dryness. Negative for pain and  itching.  Respiratory: Negative for shortness of breath and difficulty breathing.   Cardiovascular: Negative for chest pain and palpitations.  Gastrointestinal: Negative for blood in stool, constipation and diarrhea.  Endocrine: Positive for excessive thirst. Negative for increased urination.  Genitourinary: Negative for difficulty urinating.  Musculoskeletal: Positive for arthralgias, joint pain, joint swelling and morning stiffness. Negative for myalgias, muscle tenderness and myalgias.  Skin: Negative for color change, rash and redness.  Allergic/Immunologic: Negative for susceptible to infections.  Neurological: Negative for dizziness, numbness, headaches, memory loss and weakness.  Hematological: Positive for bruising/bleeding tendency.  Psychiatric/Behavioral: Negative for confusion.    PMFS History:  Patient Active Problem List   Diagnosis Date Noted  . Status post surgery 06/14/2020  . Genetic testing 02/18/2020  . Family history of breast cancer   . High risk medication use 10/30/2019  . Left bundle branch block 04/28/2019  . Right wrist pain 03/13/2019  . Hamstring strain, left, initial encounter 02/20/2019  . 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 Primary osteoarthritis of both feet 10/09/2018  . Hx of migraines 09/27/2018  . History of gastroesophageal reflux (GERD) 09/27/2018  . 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 Moderate persistent asthma 07/05/2018  . Hypothyroid 06/28/2018    Past Medical History:  Diagnosis Date  . Arthritis    ra zero negative  . Asthma    mild/moderate persistent asthma  . Complication  of anesthesia    slow to awaken 25 yrs ago  . Elevated liver function tests dr Neil Crouch pcp manages   for last year  . Family history of breast cancer   . Fibroid   . Frequent headaches   . GERD (gastroesophageal reflux disease)   . Gluten intolerance   . H/O seasonal allergies   . History of chicken pox   . History of  COVID-19 07/2019   high fever sob, x 7 days all symptoms resolved  . Left bundle branch block 02/2019   Dr.Paula Ross  . Migraines   . PONV (postoperative nausea and vomiting)    ponv with several surgeries, likes scopolamine patch  . Thyroid disease    Multiple benign nodules--thyroid removed    Family History  Problem Relation Age of Onset  . Arthritis Mother   . Cancer Mother 58       breast ca--dec age 84  . Miscarriages / [place] Mother   . Breast cancer Mother 77  . Diabetes Father   . Heart attack Father   . Heart disease Father        pacemaker   . Hyperlipidemia Father   . Hypertension Father   . Stroke Father        x4  . COPD Sister   . Peripheral Artery Disease Sister   . Asthma Brother   . Birth defects Brother   . Cancer Brother        skin  . Hyperlipidemia Brother   . Heart attack Maternal Grandmother   . Heart disease Maternal Grandmother   . Hyperlipidemia Maternal Grandmother   . Stroke Maternal Grandmother   . Alcohol abuse Paternal Grandmother   . Cancer Paternal Grandmother        colon  . Cancer Paternal Grandfather        breast  . Heart attack Paternal Grandfather   . Breast cancer Paternal Grandfather 70  . Alcohol abuse Brother   . Early death Brother        suicide  . Mental illness Brother   . Asthma Daughter   . Asthma Son   . Asthma Son   . Breast cancer Maternal Aunt 60  . Kidney cancer Paternal Aunt 85  . Breast cancer Paternal Aunt 60  . Throat cancer Paternal Uncle   . Thyroid cancer Paternal Uncle   . Breast cancer Cousin 72  . Prostate cancer Cousin    Past Surgical History:  Procedure Laterality Date  . ANTERIOR AND POSTERIOR REPAIR N/A 06/14/2020   Procedure: ANTERIOR (CYSTOCELE) AND POSTERIOR REPAIR (RECTOCELE);  Surgeon: Nunzio Cobbs, MD;  Location: [facility];  Service: Gynecology;  Laterality: N/A;  . BLADDER SUSPENSION N/A 06/14/2020   Procedure: TRANSVAGINAL TAPE (TVT)  PROCEDURE/EXACT MIDURETHRAL SLING;  Surgeon: Nunzio Cobbs, MD;  Location: [facility];  Service: Gynecology;  Laterality: N/A;  EXACT MIDURETHRAL SLING  . CARDIAC CATHETERIZATION  2009   results normal done in [place]  . CHOLECYSTECTOMY  1995   laparoscopic  . CYSTOSCOPY N/A 06/14/2020   Procedure: CYSTOSCOPY;  Surgeon: Nunzio Cobbs, MD;  Location: [facility];  Service: Gynecology;  Laterality: N/A;  . HAMMER TOE SURGERY Bilateral 2009  . HERNIA REPAIR  2015   Has abdominal wall mesh - [place], [place].  Laparoscopic incisional hernia repari with 7 x 9 inch Ventralight ST with Echo mesh  . LAPAROSCOPIC HYSTERECTOMY  2007   Supracervical hysterectomy with cystosctopy - [place], [place] partial  . THYROID SURGERY  2013   total thyroidectomy due to 11 nodules   Social History   Social History Narrative   Right Handed   Lives in a two story home   Immunization History  Administered Date(s) Administered  . Influenza,inj,Quad PF,6+ Mos 06/28/2018, 06/18/2019  . Influenza-Unspecified 01/25/2018, 08/05/2020  . Moderna Sars-Covid-2 Vaccination 01/27/2021  . PFIZER(Purple Top)SARS-COV-2 Vaccination 12/08/2019, 01/05/2020, 08/05/2020  . Pneumococcal Polysaccharide-23 10/22/2018  . Tdap 06/28/2018  . Zoster Recombinat (Shingrix) 04/15/2019, 07/16/2019     Objective: Vital Signs: BP 124/83 (BP Location: Left Arm, Patient Position: Sitting, Cuff Size: Normal)   Pulse 83   Ht 5\' 7"  (1.702 m)   Wt 202 lb (91.6 kg)   LMP  (LMP Unknown)   BMI 31.64 kg/m    Physical Exam Vitals and nursing note reviewed.  Constitutional:      Appearance: She is well-developed.  HENT:     Head: Normocephalic and atraumatic.  Eyes:     Conjunctiva/sclera: Conjunctivae normal.  Pulmonary:     Effort: Pulmonary effort is normal.  Abdominal:     Palpations: Abdomen is soft.  Musculoskeletal:     Cervical back: Normal range of motion.  Skin:     General: Skin is warm and dry.     Capillary Refill: Capillary refill takes less than 2 seconds.  Neurological:     Mental Status: She is alert and oriented to person, place, and time.  Psychiatric:        Behavior: Behavior normal.      Musculoskeletal Exam: C-spine has slightly limited range of motion with lateral rotation to the right.  Good flexion extension of the C-spine.  Thoracic and lumbar spine have good range of motion with no discomfort.  No midline spinal tenderness or SI joint tenderness.  Shoulder joints and elbow joints have good range of motion with no discomfort.  She has tenderness along the ulnar aspect of the right wrist.  No tenderness or synovitis over MCP or PIP joints.  Complete fist formation bilaterally.  PIP and DIP thickening consistent with osteoarthritis of both hands noted.  Hip joints have good range of motion with no discomfort.  Knee joints have good range of motion with no warmth or effusion.  Ankle joints have good range of motion without tenderness or inflammation.  No tenderness over MTP joints.  CDAI Exam: CDAI Score: 0.4  Patient Global: 2 mm; Provider Global: 2 mm Swollen: 0 ; Tender: 0  Joint Exam 02/08/2021   No joint exam has been documented for this visit   There is currently no information documented on the homunculus. Go to the Rheumatology activity and complete the homunculus joint exam.  Investigation: No additional findings.  Imaging: No results found.  Recent Labs: Lab Results  Component Value Date   WBC 6.8 12/13/2020   HGB 14.5 12/13/2020   PLT 249 12/13/2020   NA 138 12/13/2020   K 4.5 12/13/2020   CL 103 12/13/2020   CO2 28 12/13/2020   GLUCOSE 88 12/13/2020   BUN 11 12/13/2020   CREATININE 0.88 12/13/2020   BILITOT 0.9 12/13/2020   ALKPHOS 54 07/27/2020   AST 32 12/13/2020   ALT 33 (H) 12/13/2020   PROT 6.8 12/13/2020   ALBUMIN 4.0 07/27/2020   CALCIUM 9.4 12/13/2020   GFRAA 88 12/13/2020   QFTBGOLDPLUS NEGATIVE  10/20/2020    Speciality Comments: PLQ Eye Exam: 05/10/2020 [facility] Follow up in 1 year.   Inadequate response to methotrexate, Enbrel, Humira, Orencia-hives everywhere  Procedures:  No procedures performed Allergies: Orencia [abatacept], Shellfish allergy, Strawberry (diagnostic), Gluten meal, Lortab [hydrocodone-acetaminophen], Morphine and related, and Augmentin [amoxicillin-pot clavulanate]    Assessment / Plan:     Visit Diagnoses: Rheumatoid arthritis of multiple sites with negative rheumatoid factor ([place]) - Diagnosed at [place] [facility]. The ultrasound examination performed showed synovitis in bilateral hands: She has no synovitis on examination today.  She is some tenderness along the ulnar aspect of the right wrist.  She has noticed significant clinical improvement since restarting on Rinvoq 15 mg 1 tablet by mouth daily 7 8 weeks ago.  She has continued to take Plaquenil 200 mg 1 tablet by mouth twice daily and is tolerating it without any side effects.  Her SI joint pain has resolved since restarting Rinvoq.  She has been able to increase her exercise regimen and has not had any nocturnal pain.  She will continue on the current treatment regimen.  She was advised to notify us if she develops increased joint pain or joint swelling.  She will follow up in 3-4 months.   High risk medication use - Rinvoq 15 mg 1 tablet by mouth daily and Plaquenil 200 mg 1 tablet by mouth twice daily. PLQ Eye Exam: 05/10/2020.  CBC and CMP updated on 12/13/20. She will have updated lab work today and every 3 months. Standing orders are in place.   TB Gold negative on 10/20/2020.- Plan: CBC with Differential/Platelet, COMPLETE METABOLIC PANEL WITH GFR Advised to hold Rinvoq if she develops signs or symptoms of an infection and to resume once infection has completely cleared. Inadequate response to methotrexate, Enbrel, and Humira.  Discontinued Orencia due to developing  hives.  Primary osteoarthritis of both feet: She has been experiencing less discomfort in both feet since restarting on Rinvoq.  She has good range of motion of both ankle joints with no tenderness or inflammation on examination today.  She has been able to increase her exercise regimen on the current treatment regimen.  Trochanteric bursitis of both hips: She has no tenderness to palpation on examination today.  She has good range of motion of both hip joints with no discomfort.  Dry scalp: Unchanged  Lateral epicondylitis, right elbow: Resolved   Bruising:  She continues to have persistent bruising but has not noticed any worsening of symptoms since restarting on rinvoq.   Excessive thirst -She has noticed excessive thirst for the past 5 to 6 weeks.  She has been unable to identify a trigger.  She is also been experiencing increased frequency of urination.  During the night she has to wake up and drink about 2 bottles of water per night.  She has been using Biotene products to try to alleviate mouth dryness.  We will check hemoglobin A1c today. She will follow up with her PCP for further evaluation. Plan: HgB A1c  Weight gain: She continues to experience ongoing weight gain.  She has been able to increase her exercise regimen has not changed her diet despite persistent weight gain.  We will check hemoglobin A1c and thyroid function today.  She will follow-up with PCP for further evaluation.  Other medical conditions are listed as follows:   History of asthma  History of gastroesophageal reflux (GERD)  History of hypothyroidism - Plan: TSH  Decreased cardiac ejection fraction  Hx of migraines  Hot flashes  Orders: Orders Placed This  Encounter  Procedures  . CBC with Differential/Platelet  . COMPLETE METABOLIC PANEL WITH GFR  . HgB A1c  . TSH   No orders of the defined types were placed in this encounter.     Follow-Up Instructions: Return in about 4 months (around  06/11/2021) for Rheumatoid arthritis.   Ofilia Neas, PA-C  Note - This record has been created using Dragon software.  Chart creation errors have been sought, but may not always  have been located. Such creation errors do not reflect on  the standard of medical care.

## 2021-02-02 ENCOUNTER — Other Ambulatory Visit: Payer: Self-pay | Admitting: Family Medicine

## 2021-02-08 ENCOUNTER — Encounter: Payer: Self-pay | Admitting: Physician Assistant

## 2021-02-08 ENCOUNTER — Other Ambulatory Visit: Payer: Self-pay

## 2021-02-08 ENCOUNTER — Ambulatory Visit: Payer: No Typology Code available for payment source | Admitting: Physician Assistant

## 2021-02-08 VITALS — BP 124/83 | HR 83 | Ht 67.0 in | Wt 202.0 lb

## 2021-02-08 DIAGNOSIS — M7061 Trochanteric bursitis, right hip: Secondary | ICD-10-CM | POA: Diagnosis not present

## 2021-02-08 DIAGNOSIS — M19071 Primary osteoarthritis, right ankle and foot: Secondary | ICD-10-CM | POA: Diagnosis not present

## 2021-02-08 DIAGNOSIS — R931 Abnormal findings on diagnostic imaging of heart and coronary circulation: Secondary | ICD-10-CM

## 2021-02-08 DIAGNOSIS — R238 Other skin changes: Secondary | ICD-10-CM

## 2021-02-08 DIAGNOSIS — M0609 Rheumatoid arthritis without rheumatoid factor, multiple sites: Secondary | ICD-10-CM

## 2021-02-08 DIAGNOSIS — Z79899 Other long term (current) drug therapy: Secondary | ICD-10-CM

## 2021-02-08 DIAGNOSIS — M7062 Trochanteric bursitis, left hip: Secondary | ICD-10-CM

## 2021-02-08 DIAGNOSIS — R232 Flushing: Secondary | ICD-10-CM

## 2021-02-08 DIAGNOSIS — T148XXA Other injury of unspecified body region, initial encounter: Secondary | ICD-10-CM

## 2021-02-08 DIAGNOSIS — Z8719 Personal history of other diseases of the digestive system: Secondary | ICD-10-CM

## 2021-02-08 DIAGNOSIS — R635 Abnormal weight gain: Secondary | ICD-10-CM

## 2021-02-08 DIAGNOSIS — M7711 Lateral epicondylitis, right elbow: Secondary | ICD-10-CM

## 2021-02-08 DIAGNOSIS — Z8709 Personal history of other diseases of the respiratory system: Secondary | ICD-10-CM

## 2021-02-08 DIAGNOSIS — M19072 Primary osteoarthritis, left ankle and foot: Secondary | ICD-10-CM

## 2021-02-08 DIAGNOSIS — R631 Polydipsia: Secondary | ICD-10-CM

## 2021-02-08 DIAGNOSIS — Z8669 Personal history of other diseases of the nervous system and sense organs: Secondary | ICD-10-CM

## 2021-02-08 DIAGNOSIS — Z8639 Personal history of other endocrine, nutritional and metabolic disease: Secondary | ICD-10-CM

## 2021-02-08 NOTE — Patient Instructions (Signed)
Standing Labs We placed an order today for your standing lab work.   Please have your standing labs drawn in August and every 3 months   If possible, please have your labs drawn 2 weeks prior to your appointment so that the provider can discuss your results at your appointment.  Please note that you may see your imaging and lab results in MyChart before we have reviewed them. We may be awaiting multiple results to interpret others before contacting you. Please allow our office up to 72 hours to thoroughly review all of the results before contacting the office for clarification of your results.  We have open lab daily: Monday through Thursday from 1:30-4:30 PM and Friday from 1:30-4:00 PM at the office of Dr. Shaili Deveshwar, [facility].   Please be advised, all patients with office appointments requiring lab work will take precedent over walk-in lab work.  If possible, please come for your lab work on Monday and Friday afternoons, as you may experience shorter wait times. The office is located at [address], [address], [place], [place] [address] No appointment is necessary.   Labs are drawn by [facility]. Please bring your co-pay at the time of your lab draw.  You may receive a bill from [facility] for your lab work.  If you wish to have your labs drawn at another location, please call the office 24 hours in advance to send orders.  If you have any questions regarding directions or hours of operation,  please call [phone].   As a reminder, please drink plenty of water prior to coming for your lab work. Thanks!  

## 2021-02-09 ENCOUNTER — Telehealth: Payer: Self-pay

## 2021-02-09 LAB — CBC WITH DIFFERENTIAL/PLATELET
Absolute Monocytes: 378 cells/uL (ref 200–950)
Basophils Absolute: 32 cells/uL (ref 0–200)
Basophils Relative: 0.5 %
Eosinophils Absolute: 32 cells/uL (ref 15–500)
Eosinophils Relative: 0.5 %
HCT: 40 % (ref 35.0–45.0)
Hemoglobin: 13.6 g/dL (ref 11.7–15.5)
Lymphs Abs: 2507 cells/uL (ref 850–3900)
MCH: 29.8 pg (ref 27.0–33.0)
MCHC: 34 g/dL (ref 32.0–36.0)
MCV: 87.7 fL (ref 80.0–100.0)
MPV: 11.1 fL (ref 7.5–12.5)
Monocytes Relative: 6 %
Neutro Abs: 3352 cells/uL (ref 1500–7800)
Neutrophils Relative %: 53.2 %
Platelets: 317 10*3/uL (ref 140–400)
RBC: 4.56 10*6/uL (ref 3.80–5.10)
RDW: 12.3 % (ref 11.0–15.0)
Total Lymphocyte: 39.8 %
WBC: 6.3 10*3/uL (ref 3.8–10.8)

## 2021-02-09 LAB — COMPLETE METABOLIC PANEL WITH GFR
AG Ratio: 1.9 (calc) (ref 1.0–2.5)
ALT: 27 U/L (ref 6–29)
AST: 35 U/L (ref 10–35)
Albumin: 4.5 g/dL (ref 3.6–5.1)
Alkaline phosphatase (APISO): 53 U/L (ref 37–153)
BUN: 15 mg/dL (ref 7–25)
CO2: 27 mmol/L (ref 20–32)
Calcium: 9.9 mg/dL (ref 8.6–10.4)
Chloride: 102 mmol/L (ref 98–110)
Creat: 0.83 mg/dL (ref 0.50–1.05)
GFR, Est African American: 94 mL/min/{1.73_m2} (ref >=60)
GFR, Est Non African American: 81 mL/min/{1.73_m2} (ref >=60)
Globulin: 2.4 g/dL (calc) (ref 1.9–3.7)
Glucose, Bld: 93 mg/dL (ref 65–139)
Potassium: 4.6 mmol/L (ref 3.5–5.3)
Sodium: 139 mmol/L (ref 135–146)
Total Bilirubin: 0.5 mg/dL (ref 0.2–1.2)
Total Protein: 6.9 g/dL (ref 6.1–8.1)

## 2021-02-09 LAB — HEMOGLOBIN A1C
Hgb A1c MFr Bld: 5.3 % of total Hgb (ref <5.7)
Mean Plasma Glucose: 105 mg/dL
eAG (mmol/L): 5.8 mmol/L

## 2021-02-09 LAB — TSH: TSH: 0.09 mIU/L — ABNORMAL LOW

## 2021-02-09 NOTE — Telephone Encounter (Signed)
Patient just got lab results from rheumatologist  dr.[email or web address]. and has a concerningly low TSH and would like to know what to do from here ( labs results are in chart )

## 2021-02-09 NOTE — Progress Notes (Signed)
TSH is very low-0.09.  Please notify the patient and forward lab work to PCP.   Hgb A1c is within the desirable range.   CBC and CMP WNL

## 2021-02-09 NOTE — Telephone Encounter (Signed)
Gave pt message below, she says that she rotates every other day. 175 mcg, and then 150 mcg.

## 2021-02-09 NOTE — Telephone Encounter (Signed)
See below

## 2021-02-09 NOTE — Telephone Encounter (Signed)
Please clarify her dosing on her thyroid medication. We will need to decrease dose then recheck labs in 6-8 weeks. I will not be here for this so will need to call in at that time and ask dr. Jerline Pain to put in labs. After you clarify dose, I will send in new px for her at lower dosage.  Orma Flaming, MD [facility]

## 2021-02-10 MED ORDER — LEVOTHYROXINE SODIUM 150 MCG PO TABS: ORAL_TABLET | ORAL | 1 refills | Status: DC

## 2021-02-10 NOTE — Telephone Encounter (Signed)
Relayed message to pt. Pt understands and will go pick up prescription.

## 2021-02-10 NOTE — Telephone Encounter (Signed)
I sent in 165mcg refills so she has just in case.  Orma Flaming, MD [place]

## 2021-02-10 NOTE — Telephone Encounter (Signed)
Pt called stating the pharmacy has not received the synthroid prescription and she is not aware of what dosage the medication is being changed to. Please advise.

## 2021-02-10 NOTE — Addendum Note (Signed)
Addended by: Orma Flaming on: 02/10/2021 01:08 PM   Modules accepted: Orders

## 2021-02-10 NOTE — Telephone Encounter (Signed)
Please let her know I would cut back to just doing the 177mcg daily and stop alternating with the 144mcg. It's a small decrease in overall dosage and we will need to repeat labs in 6-8 weeks. She will need to call in 6 weeks for next provider to put in lab orders since I will not be here. Does she need refill of the 155mcg?  Thanks,  Dr. Rogers Blocker.

## 2021-02-17 ENCOUNTER — Telehealth: Payer: Self-pay

## 2021-02-17 NOTE — Telephone Encounter (Signed)
Patient wanted to wait until tomorrow, and is scheduled with Dr.Andy.   Nurse Assessment Nurse: Nyoka Cowden, RN, [place] Date/Time (Eastern Time): 02/17/2021 2:01:51 PM Confirm and document reason for call. If symptomatic, describe symptoms. ---Caller states she has vertigo that she believes is being caused by some of her medications. Her doses just changed recently to synthroid. She was transferred by the office. Her TSH level was 0.009 on her blood test. She is wanting to get an appointment sooner than August 26th. symp started 2 days ago Does the patient have any new or worsening symptoms? ---Yes Will a triage be completed? ---Yes Related visit to physician within the last 2 weeks? ---No Does the PT have any chronic conditions? (i.e. diabetes, asthma, this includes High risk factors for pregnancy, etc.) ---Yes List chronic conditions. ---hypothyroid, arthritis, Is the patient pregnant or possibly pregnant? (Ask all females between the ages of 4-55) ---No Is this a behavioral health or substance abuse call? ---No PLEASE NOTE: All timestamps contained within this report are represented as [place] Standard Time. CONFIDENTIALTY NOTICE: This fax transmission is intended only for the addressee. It contains information that is legally privileged, confidential or otherwise protected from use or disclosure. If you are not the intended recipient, you are strictly prohibited from reviewing, disclosing, copying using or disseminating any of this information or taking any action in reliance on or regarding this information. If you have received this fax in error, please notify us immediately by telephone so that we can arrange for its return to [place]. Phone: [phone], Toll-Free: [phone], Fax: [phone] Page: 2 of 2 Call Id: [id number] Guidelines Guideline Title Affirmed Question Affirmed Notes Nurse Date/Time Eilene Ghazi Time) Dizziness - Vertigo Taking a medicine that could cause dizziness  (e.g., phenytoin [Dilantin], carbamazepine [Tegretol], primidone [Mysoline]) Nyoka Cowden, RN, Ludger Nutting 02/17/2021 2:03:11 PM Disp. Time Eilene Ghazi Time) Disposition Final User 02/17/2021 2:07:34 PM See HCP within 4 Hours (or PCP triage) Yes Nyoka Cowden, RN, Ludger Nutting Caller Disagree/Comply Comply Caller Understands Yes PreDisposition Call Doctor Care Advice Given Per Guideline SEE HCP (OR PCP TRIAGE) WITHIN 4 HOURS: CARE ADVICE given per Dizziness - Vertigo (Adult) guideline. * Please bring a list of your current medicines when you go to see the doctor. BRING MEDICINES: Referrals Warm transfer to [facility]

## 2021-02-18 ENCOUNTER — Ambulatory Visit: Payer: No Typology Code available for payment source | Admitting: Family Medicine

## 2021-02-18 ENCOUNTER — Encounter: Payer: Self-pay | Admitting: Family Medicine

## 2021-02-18 ENCOUNTER — Other Ambulatory Visit: Payer: Self-pay

## 2021-02-18 VITALS — BP 122/88 | HR 91 | Temp 98.3°F | Ht 67.0 in | Wt 203.0 lb

## 2021-02-18 DIAGNOSIS — R635 Abnormal weight gain: Secondary | ICD-10-CM | POA: Diagnosis not present

## 2021-02-18 DIAGNOSIS — E890 Postprocedural hypothyroidism: Secondary | ICD-10-CM | POA: Diagnosis not present

## 2021-02-18 DIAGNOSIS — H8112 Benign paroxysmal vertigo, left ear: Secondary | ICD-10-CM

## 2021-02-18 DIAGNOSIS — M1239 Palindromic rheumatism, multiple sites: Secondary | ICD-10-CM

## 2021-02-18 DIAGNOSIS — N951 Menopausal and female climacteric states: Secondary | ICD-10-CM

## 2021-02-18 DIAGNOSIS — R682 Dry mouth, unspecified: Secondary | ICD-10-CM | POA: Diagnosis not present

## 2021-02-18 MED ORDER — MECLIZINE HCL 25 MG PO TABS: 25.0000 mg | ORAL_TABLET | Freq: Three times a day (TID) | ORAL | 1 refills | Status: DC | PRN

## 2021-02-18 NOTE — Telephone Encounter (Signed)
FYI. Patient is scheduled

## 2021-02-18 NOTE — Patient Instructions (Signed)
Please follow up if symptoms do not improve or as needed.    Benign Positional Vertigo Vertigo is the feeling that you or your surroundings are moving when they are not. Benign positional vertigo is the most common form of vertigo. This is usually a harmless condition (benign). This condition is positional. This means that symptoms are triggered by certain movements and positions. This condition can be dangerous if it occurs while you are doing something that could cause harm to you or others. This includes activities such as driving or operating machinery. What are the causes? The inner ear has fluid-filled canals that help your brain sense movement and balance. When the fluid moves, the brain receives messages about your body's position. With benign positional vertigo, crystals in the inner ear break free and disturb the inner ear area. This causes your brain to receive confusing messages about your body's position. What increases the risk? You are more likely to develop this condition if:  You are a woman.  You are 5 years of age or older.  You have recently had a head injury.  You have an inner ear disease. What are the signs or symptoms? Symptoms of this condition usually happen when you move your head or your eyes in different directions. Symptoms may start suddenly, and usually last for less than a minute. They include:  Loss of balance and falling.  Feeling like you are spinning or moving.  Feeling like your surroundings are spinning or moving.  Nausea and vomiting.  Blurred vision.  Dizziness.  Involuntary eye movement (nystagmus). Symptoms can be mild and cause only minor problems, or they can be severe and interfere with daily life. Episodes of benign positional vertigo may return (recur) over time. Symptoms may improve over time. How is this diagnosed? This condition may be diagnosed based on:  Your medical history.  Physical exam of the head, neck, and  ears.  Positional tests to check for or stimulate vertigo. You may be asked to turn your head and change positions, such as going from sitting to lying down. A health care provider will watch for symptoms of vertigo. You may be referred to a health care provider who specializes in ear, nose, and throat problems (ENT, or otolaryngologist) or a provider who specializes in disorders of the nervous system (neurologist). How is this treated? This condition may be treated in a session in which your health care provider moves your head in specific positions to help the displaced crystals in your inner ear move. Treatment for this condition may take several sessions. Surgery may be needed in severe cases, but this is rare. In some cases, benign positional vertigo may resolve on its own in 2-4 weeks.   Follow these instructions at home: Safety  Move slowly. Avoid sudden body or head movements or certain positions, as told by your health care provider.  Avoid driving until your health care provider says it is safe for you to do so.  Avoid operating heavy machinery until your health care provider says it is safe for you to do so.  Avoid doing any tasks that would be dangerous to you or others if vertigo occurs.  If you have trouble walking or keeping your balance, try using a cane for stability. If you feel dizzy or unstable, sit down right away.  Return to your normal activities as told by your health care provider. Ask your health care provider what activities are safe for you. General instructions  Take over-the-counter and  prescription medicines only as told by your health care provider.  Drink enough fluid to keep your urine pale yellow.  Keep all follow-up visits as told by your health care provider. This is important. Contact a health care provider if:  You have a fever.  Your condition gets worse or you develop new symptoms.  Your family or friends notice any behavioral changes.  You  have nausea or vomiting that gets worse.  You have numbness or a prickling and tingling sensation. Get help right away if you:  Have difficulty speaking or moving.  Are always dizzy.  Faint.  Develop severe headaches.  Have weakness in your legs or arms.  Have changes in your hearing or vision.  Develop a stiff neck.  Develop sensitivity to light. Summary  Vertigo is the feeling that you or your surroundings are moving when they are not. Benign positional vertigo is the most common form of vertigo.  This condition is caused by crystals in the inner ear that become displaced. This causes a disturbance in an area of the inner ear that helps your brain sense movement and balance.  Symptoms include loss of balance and falling, feeling that you or your surroundings are moving, nausea and vomiting, and blurred vision.  This condition can be diagnosed based on symptoms, a physical exam, and positional tests.  Follow safety instructions as told by your health care provider. You will also be told when to contact your health care provider in case of problems. This information is not intended to replace advice given to you by your health care provider. Make sure you discuss any questions you have with your health care provider. Document Revised: 07/29/2019 Document Reviewed: 02/13/2018 Elsevier Patient Education  2021 [employer].

## 2021-02-21 NOTE — Progress Notes (Signed)
Subjective  CC:  Chief Complaint  Patient presents with  . Dizziness    On going for 3 days, think it is related to the synthroid dosage change. Has been on new dosage for about a week  . Polydipsia    Has not improved with medication change either   Same day acute visit; PCP not available. New pt to me. Chart reviewed.   HPI: [name] is a 55 y.o. female who presents to the office today to address the problems listed above in the chief complaint.  55 year old female with hypothyroidism and rheumatoid arthritis.  Recently had follow-up with her rheumatologist and a TSH revealed over treatment.  Since, her thyroid dose has been decreased.  She reports that she had been on alternating doses of 175 mcg alternating with 150 mcg.  I reviewed records from her PCP.  During her rheumatology appointment, she presented with unwanted weight gain, dry mouth and hot flashes.  She is postmenopausal and has been working with her gynecologist to treat her hot flashes.  Estrogens are problematic given her strong family history of breast cancer.  She is on gabapentin which is not working well for her.  She has failed a few SSRIs.  She denies fevers or chills.  An A1c was drawn due to the dry mouth symptoms and it was normal.  2 to 3 days ago, she noted dizziness with head movement.  Given the above symptoms, she was concerned it was related to her thyroid.  She denies diplopia, dysarthria, paresis or headache.  She denies recent URI.  If she sits still her symptoms resolved.  She has had no disequilibrium or falls.  She sleeps well.  Mostly notes when she gets up out of bed.   Assessment  1. Benign paroxysmal positional vertigo of left ear   2. Post-operative hypothyroidism   3. Weight gain   4. Dry mouth   5. Palindromic rheumatism, multiple sites   6. Menopausal hot flushes      Plan   BPPV: Exam history most consistent with peripheral vertigo.  Education given.  Reassurance given.  Doubt  related to hypothyroidism.  Treat supportively with meclizine.  Fall prevention discussed.  If persist, consider neuro rehab.  Postoperative hypothyroidism, dose recently adjusted downward for a low TSH.  Education given.  Will need recheck in 8 weeks.  Weight gain and hot flashes: Related to postmenopausal status.  She will work with her OB to improve the symptoms.  Palindromic rheumatoid arthritis managed  Follow up: Has transfer of care scheduled with new provider. 04/21/2021  No orders of the defined types were placed in this encounter.  Meds ordered this encounter  Medications  . meclizine (ANTIVERT) 25 MG tablet    Sig: Take 1 tablet (25 mg total) by mouth 3 (three) times daily as needed for dizziness.    Dispense:  30 tablet    Refill:  1      I reviewed the patients updated PMH, FH, and SocHx.    Patient Active Problem List   Diagnosis Date Noted  . Status post surgery 06/14/2020  . Genetic testing 02/18/2020  . Family history of breast cancer   . High risk medication use 10/30/2019  . Left bundle branch block 04/28/2019  . Right wrist pain 03/13/2019  . Hamstring strain, left, initial encounter 02/20/2019  . 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 Primary osteoarthritis of both feet 10/09/2018  . Hx of migraines 09/27/2018  . History of gastroesophageal reflux (  GERD) 09/27/2018  . 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 Moderate persistent asthma 07/05/2018  . Hypothyroid 06/28/2018   Current Meds  Medication Sig  . acetaminophen (TYLENOL) 500 MG tablet Take 500 mg by mouth every 6 (six) hours as needed.  Marland Kitchen ADVAIR DISKUS 250-50 MCG/DOSE AEPB INHALE 1 PUFF BY MOUTH TWICE A DAY  . albuterol (VENTOLIN HFA) 108 (90 Base) MCG/ACT inhaler TAKE 2 PUFFS BY MOUTH EVERY 6 HOURS AS NEEDED FOR WHEEZE OR SHORTNESS OF BREATH  . cholecalciferol (VITAMIN D) 400 units TABS tablet Take 5,000 Units by mouth.   . cyclobenzaprine (FLEXERIL) 10 MG tablet TAKE 1 TABLET  BY MOUTH THREE TIMES A DAY AS NEEDED FOR MUSCLE SPASMS  . fexofenadine-pseudoephedrine (ALLEGRA-D 24) 180-240 MG 24 hr tablet TAKE 1 TABLET BY MOUTH EVERY DAY  . gabapentin (NEURONTIN) 800 MG tablet TAKE 1 TABLET (800 MG TOTAL) BY MOUTH AT BEDTIME.  . hydroxychloroquine (PLAQUENIL) 200 MG tablet TAKE 1 TABLET BY MOUTH TWICE A DAY  . levothyroxine (SYNTHROID) 150 MCG tablet TAKE 1 TABLET BY MOUTH daily. Repeat labs in 6-8 weeks  . meclizine (ANTIVERT) 25 MG tablet Take 1 tablet (25 mg total) by mouth 3 (three) times daily as needed for dizziness.  . montelukast (SINGULAIR) 10 MG tablet TAKE 1 TABLET BY MOUTH EVERYDAY AT BEDTIME  . rosuvastatin (CRESTOR) 5 MG tablet TAKE 1/2 TABLET BY MOUTH DAILY  . Upadacitinib ER (RINVOQ) 15 MG TB24 Take 15 mg by mouth daily. To replace Kevzara  . venlafaxine (EFFEXOR) 37.5 MG tablet Take 1 tablet (37.5 mg total) by mouth 2 (two) times daily. (Patient taking differently: Take 18.75 mg by mouth daily.)    Allergies: Patient is allergic to orencia [abatacept], shellfish allergy, strawberry (diagnostic), gluten meal, lortab [hydrocodone-acetaminophen], morphine and related, and augmentin [amoxicillin-pot clavulanate]. Family History: Patient family history includes Alcohol abuse in her brother and paternal grandmother; Arthritis in her mother; Asthma in her brother, daughter, son, and son; Birth defects in her brother; Breast cancer (age of onset: 24) in her mother; Breast cancer (age of onset: 39) in her cousin; Breast cancer (age of onset: 54) in her maternal aunt and paternal grandfather; Breast cancer (age of onset: 19) in her paternal aunt; COPD in her sister; Cancer in her brother, paternal grandfather, and paternal grandmother; Cancer (age of onset: 27) in her mother; Diabetes in her father; Early death in her brother; Heart attack in her father, maternal grandmother, and paternal grandfather; Heart disease in her father and maternal grandmother; Hyperlipidemia in  her brother, father, and maternal grandmother; Hypertension in her father; Kidney cancer (age of onset: 24) in her paternal aunt; Mental illness in her brother; Miscarriages / Stillbirths in her mother; Peripheral Artery Disease in her sister; Prostate cancer in her cousin; Stroke in her father and maternal grandmother; Throat cancer in her paternal uncle; Thyroid cancer in her paternal uncle. Social History:  Patient  reports that she has never smoked. She has never used smokeless tobacco. She reports current alcohol use. She reports that she does not use drugs.  Review of Systems: Constitutional: Negative for fever malaise or anorexia Cardiovascular: negative for chest pain Respiratory: negative for SOB or persistent cough Gastrointestinal: negative for abdominal pain  Objective  Vitals: BP 122/88   Pulse 91   Temp 98.3 F (36.8 C) (Temporal)   Ht 5\' 7"  (1.702 m)   Wt 203 lb (92.1 kg)   LMP  (LMP Unknown)   SpO2 98%   BMI 31.79 kg/m  General: no acute distress , A&Ox3 HEENT: PEERL, conjunctiva normal, neck is supple Cardiovascular:  RRR without murmur or gallop.  Respiratory:  Good breath sounds bilaterally, CTAB with normal respiratory effort Skin:  Warm, no rashes Neuro: Moves to exam table easily.  Normal gait.  Positive vertigo with turning head to the left without nystagmus.  Deferred Hallpike maneuver due to dizziness.  Normal on right.  Nonfocal neuro exam     Commons side effects, risks, benefits, and alternatives for medications and treatment plan prescribed today were discussed, and the patient expressed understanding of the given instructions. Patient is instructed to call or message via MyChart if he/she has any questions or concerns regarding our treatment plan. No barriers to understanding were identified. We discussed Red Flag symptoms and signs in detail. Patient expressed understanding regarding what to do in case of urgent or emergency type symptoms.   Medication  list was reconciled, printed and provided to the patient in AVS. Patient instructions and summary information was reviewed with the patient as documented in the AVS. This note was prepared with assistance of Dragon voice recognition software. Occasional wrong-word or sound-a-like substitutions may have occurred due to the inherent limitations of voice recognition software  This visit occurred during the SARS-CoV-2 public health emergency.  Safety protocols were in place, including screening questions prior to the visit, additional usage of staff PPE, and extensive cleaning of exam room while observing appropriate contact time as indicated for disinfecting solutions.

## 2021-02-27 ENCOUNTER — Other Ambulatory Visit: Payer: Self-pay | Admitting: Physician Assistant

## 2021-02-27 DIAGNOSIS — M1230 Palindromic rheumatism, unspecified site: Secondary | ICD-10-CM

## 2021-02-28 NOTE — Telephone Encounter (Signed)
Last Visit: 02/08/2021  Next Visit: 06/14/2021  Labs: 02/08/2021 CBC and CMP WNL  Eye exam:  05/10/2020 WNL    Current Dose per office note 02/08/2021: Plaquenil 200 mg 1 tablet by mouth twice daily  [id number] arthritis of multiple sites with negative rheumatoid factor  Last Fill: 12/08/2020  Okay to refill Plaquenil?

## 2021-03-22 ENCOUNTER — Telehealth: Payer: Self-pay

## 2021-03-22 ENCOUNTER — Other Ambulatory Visit: Payer: Self-pay | Admitting: *Deleted

## 2021-03-22 DIAGNOSIS — E039 Hypothyroidism, unspecified: Secondary | ICD-10-CM

## 2021-03-22 NOTE — Telephone Encounter (Signed)
Pt called asking about labs for her thyroid to be checked. She is transferring to Dr Jerline Pain from [place]. There have been no orders placed for labs.

## 2021-03-22 NOTE — Telephone Encounter (Signed)
See message under orders only.

## 2021-03-22 NOTE — Progress Notes (Signed)
Spoke to pt told her can discuss with Dr. Jerline Pain at upcoming visit about thyroid. Pt said she was suppose to have thyroid checked in 6 weeks due to medication change. Told pt okay, I will place order for TSH and results will go to Dr. Jerline Pain. Please call back and schedule lab appt only. Pt verbalized understanding. Orders placed in Epic.

## 2021-03-24 ENCOUNTER — Other Ambulatory Visit (INDEPENDENT_AMBULATORY_CARE_PROVIDER_SITE_OTHER): Payer: Self-pay

## 2021-03-24 DIAGNOSIS — E039 Hypothyroidism, unspecified: Secondary | ICD-10-CM

## 2021-03-24 LAB — TSH: TSH: 0.02 u[IU]/mL — ABNORMAL LOW (ref 0.35–5.50)

## 2021-03-25 ENCOUNTER — Encounter: Payer: Self-pay | Admitting: Physician Assistant

## 2021-03-25 NOTE — Progress Notes (Signed)
Please inform patient of the following:  Her TSH is low which means she is getting too much thyroid. Would increase in dose of Synthroid to 125 mcg daily and recheck in about 6 months.  Please see the corresponding MyChart message.  I am okay with referral to endocrinology if she would prefer that.

## 2021-03-28 ENCOUNTER — Other Ambulatory Visit: Payer: Self-pay

## 2021-03-28 MED ORDER — LEVOTHYROXINE SODIUM 125 MCG PO TABS: ORAL_TABLET | ORAL | 0 refills | Status: DC

## 2021-03-28 NOTE — Telephone Encounter (Signed)
Please see message and Dr. Marigene Ehlers comments and advise.

## 2021-03-28 NOTE — Progress Notes (Signed)
See other message

## 2021-04-07 ENCOUNTER — Other Ambulatory Visit (HOSPITAL_COMMUNITY): Payer: Self-pay

## 2021-04-07 ENCOUNTER — Telehealth: Payer: Self-pay

## 2021-04-07 DIAGNOSIS — Z79899 Other long term (current) drug therapy: Secondary | ICD-10-CM

## 2021-04-07 DIAGNOSIS — M0609 Rheumatoid arthritis without rheumatoid factor, multiple sites: Secondary | ICD-10-CM

## 2021-04-07 MED ORDER — RINVOQ 15 MG PO TB24: 15.0000 mg | ORAL_TABLET | Freq: Every day | ORAL | 0 refills | Status: DC

## 2021-04-07 NOTE — Telephone Encounter (Signed)
[facility] left a voicemail stating the order for Rinvoq has been cancelled for the patient due to her insurance changing to an out of network pharmacy that we are not contracted with.   If you have any questions, please call back at [phone]

## 2021-04-07 NOTE — Telephone Encounter (Addendum)
Patient had change in insurance and must now fill through [facility]. Rx sent to [facility].  Rx sent for Rinvoq 15 mg once daily x 1 month only.  She is due for updated labs in August 2022.  Will need new prior authorization placed. Patient is due for fill. Will route to pharmacy pool to initiate  Knox Saliva, PharmD, MPH, BCPS Clinical Pharmacist (Rheumatology and Pulmonology)

## 2021-04-08 NOTE — Telephone Encounter (Signed)
Submitted an urgent Prior Authorization request to [place] for [facility] via CoverMyMeds. Will update once we receive a response. Patient had insurance change and this is continuation of therpay  Key: [id number]   Knox Saliva, PharmD, MPH, BCPS Clinical Pharmacist (Rheumatology and Pulmonology)

## 2021-04-08 NOTE — Telephone Encounter (Addendum)
Received notification from [place] regarding a prior authorization for [facility]. Authorization has been APPROVED from 03/09/21 to 10/05/21.   Patient must fill through [facility]: [phone]. Rx already sent to pharmacy  Authorization # [id number]  Knox Saliva, PharmD, MPH, BCPS Clinical Pharmacist (Rheumatology and Pulmonology)

## 2021-04-20 ENCOUNTER — Other Ambulatory Visit: Payer: Self-pay | Admitting: Physician Assistant

## 2021-04-20 DIAGNOSIS — M0609 Rheumatoid arthritis without rheumatoid factor, multiple sites: Secondary | ICD-10-CM

## 2021-04-20 NOTE — Telephone Encounter (Signed)
Last Visit: 02/08/2021   Next Visit: 06/14/2021   Labs: 02/08/2021 CBC and CMP WNL  TB Gold: 10/20/2020 Neg   Current Dose per office note 02/08/2021: Rinvoq 15 mg 1 tablet by mouth daily   [id number] arthritis of multiple sites with negative rheumatoid factor   Last Fill: 04/07/2021 (30 day Supply)   Okay to refill Rinvoq?

## 2021-04-21 ENCOUNTER — Encounter: Payer: Self-pay | Admitting: Physician Assistant

## 2021-04-21 ENCOUNTER — Encounter: Payer: No Typology Code available for payment source | Admitting: Family Medicine

## 2021-05-04 ENCOUNTER — Encounter: Payer: Self-pay | Admitting: Physician Assistant

## 2021-05-04 ENCOUNTER — Other Ambulatory Visit: Payer: Self-pay

## 2021-05-04 ENCOUNTER — Ambulatory Visit (INDEPENDENT_AMBULATORY_CARE_PROVIDER_SITE_OTHER): Payer: 59 | Admitting: Physician Assistant

## 2021-05-04 VITALS — BP 95/63 | HR 79 | Temp 98.2°F | Ht 67.0 in | Wt 202.2 lb

## 2021-05-04 DIAGNOSIS — N951 Menopausal and female climacteric states: Secondary | ICD-10-CM

## 2021-05-04 DIAGNOSIS — J4540 Moderate persistent asthma, uncomplicated: Secondary | ICD-10-CM | POA: Diagnosis not present

## 2021-05-04 DIAGNOSIS — E890 Postprocedural hypothyroidism: Secondary | ICD-10-CM

## 2021-05-04 LAB — TSH: TSH: 1.53 u[IU]/mL (ref 0.35–5.50)

## 2021-05-04 MED ORDER — ADVAIR HFA 115-21 MCG/ACT IN AERO: 2.0000 | INHALATION_SPRAY | Freq: Two times a day (BID) | RESPIRATORY_TRACT | 12 refills | Status: DC

## 2021-05-04 NOTE — Patient Instructions (Signed)
Good to meet you today! Please go to the lab for blood work and I will send results through [facility]. Keep up good work with healthy lifestyle!!

## 2021-05-04 NOTE — Progress Notes (Signed)
Established Patient Office Visit  Subjective:  Patient ID: [name], female    DOB: 1966-03-12  Age: 55 y.o. MRN: [id number]  CC:  Chief Complaint  Patient presents with   Establish Care   Thyroid Problem    HPI [name] presents for [facility] visit. She is able to stay at home. Happy with life overall. Father-in-law living with them since Nov 2021 and having memory issues, which is causing some stress.  Pt is post-menopausal. She says she is still experiencing hot flashes. This is also causing difficulty sleeping. Stopped Effexor because the side effects were not worth it per patient, gave her a very flat affect and decreased her libido. Gabapentin does help some for the hot flashes per patient & she takes 400 mg AM and 400 mg PM. Works out daily at [employer]. Still having trouble losing weight.  Hx of total thyroidectomy due to 11 nodules in 2013. Currently taking Levothyroxine 125 mcg. Needs her TSH rechecked today.   Past Medical History:  Diagnosis Date   Arthritis    ra zero negative   Asthma    mild/moderate persistent asthma   Complication of anesthesia    slow to awaken 25 yrs ago   Elevated liver function tests dr Neil Crouch pcp manages   for last year   Family history of breast cancer    Fibroid    Frequent headaches    GERD (gastroesophageal reflux disease)    Gluten intolerance    H/O seasonal allergies    History of chicken pox    History of COVID-19 07/2019   high fever sob, x 7 days all symptoms resolved   Left bundle branch block 02/2019   Dr.Paula Ross   Migraines    PONV (postoperative nausea and vomiting)    ponv with several surgeries, likes scopolamine patch   Thyroid disease    Multiple benign nodules--thyroid removed    Past Surgical History:  Procedure Laterality Date   ANTERIOR AND POSTERIOR REPAIR N/A 06/14/2020   Procedure: ANTERIOR (CYSTOCELE) AND POSTERIOR REPAIR (RECTOCELE);  Surgeon: Nunzio Cobbs, MD;  Location:  [facility];  Service: Gynecology;  Laterality: N/A;   BLADDER SUSPENSION N/A 06/14/2020   Procedure: TRANSVAGINAL TAPE (TVT) PROCEDURE/EXACT MIDURETHRAL SLING;  Surgeon: Nunzio Cobbs, MD;  Location: [facility];  Service: Gynecology;  Laterality: N/A;  EXACT MIDURETHRAL SLING   CARDIAC CATHETERIZATION  2009   results normal done in [place]   CHOLECYSTECTOMY  1995   laparoscopic   CYSTOSCOPY N/A 06/14/2020   Procedure: CYSTOSCOPY;  Surgeon: Nunzio Cobbs, MD;  Location: [facility];  Service: Gynecology;  Laterality: N/A;   HAMMER TOE SURGERY Bilateral 2009   HERNIA REPAIR  2015   Has abdominal wall mesh - [place], [place].  Laparoscopic incisional hernia repari with 7 x 9 inch Ventralight ST with Echo mesh   LAPAROSCOPIC HYSTERECTOMY  2007   Supracervical hysterectomy with cystosctopy [place], [place] partial   THYROID SURGERY  2013   total thyroidectomy due to 11 nodules    Family History  Problem Relation Age of Onset   Arthritis Mother    Cancer Mother 67       breast ca--dec age 54   Miscarriages / [place] Mother    Breast cancer Mother 79   Diabetes Father    Heart attack Father    Heart disease Father  pacemaker    Hyperlipidemia Father    Hypertension Father    Stroke Father        x4   COPD Sister    Peripheral Artery Disease Sister    Asthma Brother    Birth defects Brother    Cancer Brother        skin   Hyperlipidemia Brother    Heart attack Maternal Grandmother    Heart disease Maternal Grandmother    Hyperlipidemia Maternal Grandmother    Stroke Maternal Grandmother    Alcohol abuse Paternal Grandmother    Cancer Paternal Grandmother        colon   Cancer Paternal Grandfather        breast   Heart attack Paternal Grandfather    Breast cancer Paternal Grandfather 63   Alcohol abuse Brother    Early death Brother        suicide   Mental illness Brother    Asthma Daughter     Asthma Son    Asthma Son    Breast cancer Maternal Aunt 60   Kidney cancer Paternal Aunt 54   Breast cancer Paternal Aunt 4   Throat cancer Paternal Uncle    Thyroid cancer Paternal Uncle    Breast cancer Cousin 15   Prostate cancer Cousin     Social History   Socioeconomic History   Marital status: Married    Spouse name: Not on file   Number of children: 3   Years of education: Not on file   Highest education level: Not on file  Occupational History   Not on file  Tobacco Use   Smoking status: Never   Smokeless tobacco: Never  Vaping Use   Vaping Use: Never used  Substance and Sexual Activity   Alcohol use: Yes    Comment: 2 glasses of wine/month   Drug use: Never   Sexual activity: Yes    Partners: Male    Birth control/protection: Surgical    Comment: Hyst-ovaries remain  Other Topics Concern   Not on file  Social History Narrative   Right Handed   Lives in a two story home   Social Determinants of Health   Financial Resource Strain: Not on file  Food Insecurity: Not on file  Transportation Needs: Not on file  Physical Activity: Not on file  Stress: Not on file  Social Connections: Not on file  Intimate Partner Violence: Not on file    Outpatient Medications Prior to Visit  Medication Sig Dispense Refill   acetaminophen (TYLENOL) 500 MG tablet Take 500 mg by mouth every 6 (six) hours as needed.     ADVAIR DISKUS 250-50 MCG/DOSE AEPB INHALE 1 PUFF BY MOUTH TWICE A DAY 180 each 3   albuterol (VENTOLIN HFA) 108 (90 Base) MCG/ACT inhaler TAKE 2 PUFFS BY MOUTH EVERY 6 HOURS AS NEEDED FOR WHEEZE OR SHORTNESS OF BREATH 25.5 each 5   cholecalciferol (VITAMIN D) 400 units TABS tablet Take 5,000 Units by mouth.      cyclobenzaprine (FLEXERIL) 10 MG tablet TAKE 1 TABLET BY MOUTH THREE TIMES A DAY AS NEEDED FOR MUSCLE SPASMS 30 tablet 1   fexofenadine-pseudoephedrine (ALLEGRA-D 24) 180-240 MG 24 hr tablet TAKE 1 TABLET BY MOUTH EVERY DAY 30 tablet 4   gabapentin  (NEURONTIN) 800 MG tablet TAKE 1 TABLET (800 MG TOTAL) BY MOUTH AT BEDTIME. 90 tablet 1   hydroxychloroquine (PLAQUENIL) 200 MG tablet TAKE 1 TABLET BY MOUTH TWICE A DAY 180 tablet 0  levothyroxine (SYNTHROID) 125 MCG tablet TAKE 1 TABLET BY MOUTH daily. 90 tablet 0   montelukast (SINGULAIR) 10 MG tablet TAKE 1 TABLET BY MOUTH EVERYDAY AT BEDTIME 90 tablet 3   rosuvastatin (CRESTOR) 5 MG tablet TAKE 1/2 TABLET BY MOUTH DAILY 45 tablet 3   Upadacitinib ER (RINVOQ) 15 MG TB24 Take 1 tablet by mouth daily. 30 tablet 2   meclizine (ANTIVERT) 25 MG tablet Take 1 tablet (25 mg total) by mouth 3 (three) times daily as needed for dizziness. 30 tablet 1   venlafaxine (EFFEXOR) 37.5 MG tablet Take 1 tablet (37.5 mg total) by mouth 2 (two) times daily. (Patient taking differently: Take 18.75 mg by mouth daily.) 60 tablet 8   No facility-administered medications prior to visit.    Allergies  Allergen Reactions   Orencia [Abatacept] Hives   Shellfish Allergy Anaphylaxis and Hives   Strawberry (Diagnostic) Anaphylaxis    Hazelnuts hives asthma, okra hives asthma Strawberry throat closes   Gluten Meal     Bloody diarrhea and hives   Lortab [Hydrocodone-Acetaminophen]     Swelling of tongue   Morphine And Related Itching    irritable   Augmentin [Amoxicillin-Pot Clavulanate] Rash    ROS Review of Systems REFER TO HPI FOR PERTINENT POSITIVES AND NEGATIVES    Objective:    Physical Exam Vitals and nursing note reviewed.  Constitutional:      General: She is not in acute distress.    Appearance: Normal appearance. She is obese.  HENT:     Head: Normocephalic.     Right Ear: External ear normal.     Left Ear: External ear normal.     Nose: Nose normal.     Mouth/Throat:     Mouth: Mucous membranes are moist.  Eyes:     Extraocular Movements: Extraocular movements intact.     Conjunctiva/sclera: Conjunctivae normal.     Pupils: Pupils are equal, round, and reactive to light.  Neck:      Thyroid: No thyroid mass, thyromegaly or thyroid tenderness.  Cardiovascular:     Rate and Rhythm: Normal rate and regular rhythm.     Pulses: Normal pulses.     Heart sounds: No murmur heard. Pulmonary:     Effort: Pulmonary effort is normal.     Breath sounds: Normal breath sounds.  Abdominal:     Tenderness: There is no abdominal tenderness.  Musculoskeletal:        General: Normal range of motion.     Cervical back: Normal range of motion.  Skin:    General: Skin is warm.  Neurological:     General: No focal deficit present.     Mental Status: She is alert and oriented to person, place, and time.     Gait: Gait normal.  Psychiatric:        Mood and Affect: Mood normal.        Behavior: Behavior normal.    BP 95/63   Pulse 79   Temp 98.2 F (36.8 C)   Ht '5\' 7"'$  (1.702 m)   Wt 202 lb 4 oz (91.7 kg)   LMP  (LMP Unknown)   SpO2 99%   BMI 31.68 kg/m  Wt Readings from Last 3 Encounters:  05/04/21 202 lb 4 oz (91.7 kg)  02/18/21 203 lb (92.1 kg)  02/08/21 202 lb (91.6 kg)     Health Maintenance Due  Topic Date Due   Pneumococcal Vaccine 70-48 Years old (2 - PCV) 10/23/2019  INFLUENZA VACCINE  04/18/2021    There are no preventive care reminders to display for this patient.  Lab Results  Component Value Date   TSH 0.02 (L) 03/24/2021   Lab Results  Component Value Date   WBC 6.3 02/08/2021   HGB 13.6 02/08/2021   HCT 40.0 02/08/2021   MCV 87.7 02/08/2021   PLT 317 02/08/2021   Lab Results  Component Value Date   NA 139 02/08/2021   K 4.6 02/08/2021   CO2 27 02/08/2021   GLUCOSE 93 02/08/2021   BUN 15 02/08/2021   CREATININE 0.83 02/08/2021   BILITOT 0.5 02/08/2021   ALKPHOS 54 07/27/2020   AST 35 02/08/2021   ALT 27 02/08/2021   PROT 6.9 02/08/2021   ALBUMIN 4.0 07/27/2020   CALCIUM 9.9 02/08/2021   ANIONGAP 11 06/14/2020   GFR 65.02 11/28/2019   Lab Results  Component Value Date   CHOL 164 10/20/2020   Lab Results  Component Value Date    HDL 78 10/20/2020   Lab Results  Component Value Date   LDLCALC 72 10/20/2020   Lab Results  Component Value Date   TRIG 67 10/20/2020   Lab Results  Component Value Date   CHOLHDL 2.1 10/20/2020   Lab Results  Component Value Date   HGBA1C 5.3 02/08/2021      Assessment & Plan:   Problem List Items Addressed This Visit   None Visit Diagnoses     Post-operative hypothyroidism    -  Primary   Relevant Orders   TSH   Menopausal hot flushes           Meds ordered this encounter  Medications   fluticasone-salmeterol (ADVAIR HFA) 115-21 MCG/ACT inhaler    Sig: Inhale 2 puffs into the lungs 2 (two) times daily.    Dispense:  1 each    Refill:  12    Follow-up: Return in about 6 months (around 11/04/2021) for med check .   1. Post-operative hypothyroidism Lab Results  Component Value Date   TSH 0.02 (L) 03/24/2021   Recheck today and adjust synthroid accordingly. Currently on 125 mcg daily.  2. Menopausal hot flushes She discontinued Effexor due to side effects. She will continue gabapentin 400 mg BID at this time.   3. Moderate persistent asthma without complication Stable, feels like she has seasonal issues. Currently requesting lower dose of Advair & I sent this in for her today. She will call if any problems.   Total time spent with patient was 35 minutes including H &P, lab review, plan, and documentation today.  Chuckie Mccathern M Zooey Schreurs, PA-C

## 2021-05-10 NOTE — Telephone Encounter (Signed)
Contacted patient and scheduled for 05/12/2021 at 8:00 am to discuss treatment options.

## 2021-05-10 NOTE — Progress Notes (Signed)
Office Visit Note  Patient: [name]             Date of Birth: 1966/04/25           MRN: [id number]             PCP: Orma Flaming, MD Referring: Orma Flaming, MD Visit Date: 05/12/2021 Occupation: '@GUAROCC'$ @  Subjective:  Low back pain  History of Present Illness: [name] is a 55 y.o. female with history of seronegative rheumatoid arthritis and osteoarthritis.  Patient is currently taking Rinvoq 15 mg 1 tablet by mouth daily and Plaquenil 200 mg 1 tablet by mouth twice daily.  She has not missed any doses recently. She has been experiencing intermittent discomfort in her lower back over the past several months.  She states the pain is in the sacral region.  She denies any symptoms of radiculopathy.  She states that the pain is most severe with weather changes.  She states that she is also been having intermittent pain in the right ankle which is also exacerbated by weather changes.  For the past 1-1/2 weeks she has been experiencing nocturnal pain and describes it as a throbbing sensation.  She is not experiencing any joint swelling currently.  She has ongoing stiffness in her right wrist but has no tenderness or joint swelling today.  She takes advil very sparingly when her pain is severe. She continues to exercise 6 days per week. Her energy level has been stable.  She denies any recent infections.    Activities of Daily Living:  Patient reports morning stiffness for all day. Patient Reports nocturnal pain.  Difficulty dressing/grooming: Denies Difficulty climbing stairs: Denies Difficulty getting out of chair: Denies Difficulty using hands for taps, buttons, cutlery, and/or writing: Reports  Review of Systems  Constitutional:  Negative for fatigue.  HENT:  Positive for mouth dryness. Negative for mouth sores and nose dryness.   Eyes:  Positive for dryness. Negative for pain and itching.  Respiratory:  Negative for shortness of breath and difficulty breathing.    Cardiovascular:  Negative for chest pain and palpitations.  Gastrointestinal:  Negative for blood in stool, constipation and diarrhea.  Endocrine: Negative for increased urination.  Genitourinary:  Negative for difficulty urinating.  Musculoskeletal:  Positive for joint pain, joint pain, joint swelling and morning stiffness. Negative for myalgias, muscle tenderness and myalgias.  Skin:  Negative for color change, rash and redness.  Allergic/Immunologic: Negative for susceptible to infections.  Neurological:  Positive for headaches. Negative for dizziness, numbness, memory loss and weakness.  Hematological:  Positive for bruising/bleeding tendency.  Psychiatric/Behavioral:  Negative for confusion.    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   Moderate persistent asthma 07/05/2018   Hypothyroid 06/28/2018    Past Medical History:  Diagnosis Date   Arthritis    ra zero negative   Asthma    mild/moderate persistent asthma   Complication of anesthesia    slow to awaken 25 yrs ago  Elevated liver function tests dr Neil Crouch pcp manages   for last year   Family history of breast cancer    Fibroid    Frequent headaches    GERD (gastroesophageal reflux disease)    Gluten intolerance    H/O seasonal allergies    History of chicken pox    History of COVID-19 07/2019   high fever sob, x 7 days all symptoms resolved   Left bundle branch block 02/2019   Dr.Paula Ross    Migraines    PONV (postoperative nausea and vomiting)    ponv with several surgeries, likes scopolamine patch   Thyroid disease    Multiple benign nodules--thyroid removed    Family History  Problem Relation Age of Onset   Arthritis Mother    Cancer Mother 57       breast ca--dec age 58   Miscarriages / [place] Mother    Breast cancer Mother 56   Diabetes Father    Heart attack Father    Heart disease Father        pacemaker    Hyperlipidemia Father    Hypertension Father    Stroke Father        x4   COPD Sister    Peripheral Artery Disease Sister    Asthma Brother    Birth defects Brother    Cancer Brother        skin   Hyperlipidemia Brother    Heart attack Maternal Grandmother    Heart disease Maternal Grandmother    Hyperlipidemia Maternal Grandmother    Stroke Maternal Grandmother    Alcohol abuse Paternal Grandmother    Cancer Paternal Grandmother        colon   Cancer Paternal Grandfather        breast   Heart attack Paternal Grandfather    Breast cancer Paternal Grandfather 69   Alcohol abuse Brother    Early death Brother        suicide   Mental illness Brother    Asthma Daughter    Asthma Son    Asthma Son    Breast cancer Maternal Aunt 60   Kidney cancer Paternal Aunt 85   Breast cancer Paternal Aunt 65   Throat cancer Paternal Uncle    Thyroid cancer Paternal Uncle    Breast cancer Cousin 50   Prostate cancer Cousin    Past Surgical History:  Procedure Laterality Date   ANTERIOR AND POSTERIOR REPAIR N/A 06/14/2020   Procedure: ANTERIOR (CYSTOCELE) AND POSTERIOR REPAIR (RECTOCELE);  Surgeon: Nunzio Cobbs, MD;  Location: [facility];  Service: Gynecology;  Laterality: N/A;   BLADDER SUSPENSION N/A 06/14/2020   Procedure: TRANSVAGINAL TAPE (TVT) PROCEDURE/EXACT MIDURETHRAL SLING;  Surgeon: Nunzio Cobbs, MD;  Location: [facility];  Service: Gynecology;  Laterality: N/A;  EXACT MIDURETHRAL  SLING   CARDIAC CATHETERIZATION  2009   results normal done in [place]   CHOLECYSTECTOMY  1995   laparoscopic   CYSTOSCOPY N/A 06/14/2020   Procedure: CYSTOSCOPY;  Surgeon: Nunzio Cobbs, MD;  Location: [facility];  Service: Gynecology;  Laterality: N/A;   HAMMER TOE SURGERY Bilateral 2009   HERNIA REPAIR  2015   Has abdominal wall mesh - [place], [place].  Laparoscopic incisional hernia repari with 7 x 9 inch Ventralight ST with Echo mesh   LAPAROSCOPIC HYSTERECTOMY  2007   Supracervical hysterectomy with cystosctopy [place] partial  THYROID SURGERY  2013   total thyroidectomy due to 11 nodules   Social History   Social History Narrative   Right Handed   Lives in a two story home   Immunization History  Administered Date(s) Administered   Influenza,inj,Quad PF,6+ Mos 06/28/2018, 06/18/2019   Influenza-Unspecified 01/25/2018, 08/05/2020   Moderna Sars-Covid-2 Vaccination 01/27/2021   PFIZER(Purple Top)SARS-COV-2 Vaccination 12/08/2019, 01/05/2020, 08/05/2020   Pneumococcal Polysaccharide-23 10/22/2018   Tdap 06/28/2018   Zoster Recombinat (Shingrix) 04/15/2019, 07/16/2019     Objective: Vital Signs: BP 116/79 (BP Location: Left Arm, Patient Position: Sitting, Cuff Size: Normal)   Pulse 89   Ht '5\' 7"'$  (1.702 m)   Wt 206 lb 9.6 oz (93.7 kg)   LMP  (LMP Unknown)   BMI 32.36 kg/m    Physical Exam Vitals and nursing note reviewed.  Constitutional:      Appearance: She is well-developed.  HENT:     Head: Normocephalic and atraumatic.  Eyes:     Conjunctiva/sclera: Conjunctivae normal.  Pulmonary:     Effort: Pulmonary effort is normal.  Abdominal:     Palpations: Abdomen is soft.  Musculoskeletal:     Cervical back: Normal range of motion.  Skin:    General: Skin is warm and dry.     Capillary Refill: Capillary refill takes less than 2 seconds.  Neurological:     Mental Status: She is alert and oriented to person, place, and time.   Psychiatric:        Behavior: Behavior normal.     Musculoskeletal Exam: C-spine, thoracic spine, and lumbar spine have good ROM with no discomfort.  Midline spinal tenderness in the lumbar region.  Tenderness over the sacral region including both SI joints.  Shoulder joints and elbow joints have good ROM with no discomfort or tenderness.  Some stiffness with right wrist ROM.  No tenderness or synovitis of MCP or PIP joints.  Complete fist formation bilaterally. Hip joints have good ROM with no discomfort.  Knee joints have good ROM with no warmth or effusion.  Tenderness over the anterolateral aspect of the right ankle.  No evidence of achilles tendonitis or plantar fasciitis.  No tenderness over MTP joints.  Small dorsal spur right foot.   CDAI Exam: CDAI Score: -- Patient Global: --; Provider Global: -- Swollen: --; Tender: -- Joint Exam 05/12/2021   No joint exam has been documented for this visit   There is currently no information documented on the homunculus. Go to the Rheumatology activity and complete the homunculus joint exam.  Investigation: No additional findings.  Imaging: No results found.  Recent Labs: Lab Results  Component Value Date   WBC 6.3 02/08/2021   HGB 13.6 02/08/2021   PLT 317 02/08/2021   NA 139 02/08/2021   K 4.6 02/08/2021   CL 102 02/08/2021   CO2 27 02/08/2021   GLUCOSE 93 02/08/2021   BUN 15 02/08/2021   CREATININE 0.83 02/08/2021   BILITOT 0.5 02/08/2021   ALKPHOS 54 07/27/2020   AST 35 02/08/2021   ALT 27 02/08/2021   PROT 6.9 02/08/2021   ALBUMIN 4.0 07/27/2020   CALCIUM 9.9 02/08/2021   GFRAA 94 02/08/2021   QFTBGOLDPLUS NEGATIVE 10/20/2020    Speciality Comments: PLQ Eye Exam: 05/10/2020 [facility] Follow up in 1 year.   Inadequate response to methotrexate, Enbrel, Humira, Orencia-hives everywhere  Procedures:  No procedures performed Allergies: Orencia [abatacept], Shellfish allergy, Strawberry (diagnostic), Gluten  meal, Lortab [hydrocodone-acetaminophen], Morphine and related, and  Augmentin [amoxicillin-pot clavulanate]   Assessment / Plan:     Visit Diagnoses: Rheumatoid arthritis of multiple sites with negative rheumatoid factor ([place]) - Diagnosed at [place] [facility]. The ultrasound examination performed showed synovitis in bilateral hands:  She has no synovitis on examination today.  Her rheumatoid arthritis has been well controlled on Rinvoq 15 mg 1 tablet by mouth daily and Plaquenil 200 mg 1 tablet by mouth twice daily.  She has been experiencing intermittent lower back pain in the sacral region as well as intermittent pain in the right ankle joint.  She remains active exercising 6 days a week.  She tries to avoid taking Advil due to history of elevated LFTs but occasionally has to take it for severe pain relief.  On examination today she had mild tenderness over the anterolateral aspect of the right ankle but no warmth or synovitis was noted.  She is currently only experiencing stiffness in her right ankle joint but occasionally experiences a throbbing pain at night with weather changes.  She has some diffuse tenderness in her lower back especially in the sacral region.  X-rays of the lumbar spine and pelvis were updated today.  We will check a sed rate with her routine lab work.  Discussed that her lower back pain is unrelated to rheumatoid arthritis.  She will remain on the current treatment regimen.  She was advised to notify us if her right ankle joint pain and stiffness persist or worsen.  - Plan: Sedimentation rate  High risk medication use - Rinvoq 15 mg 1 tablet by mouth daily and Plaquenil 200 mg 1 tablet by mouth twice daily. PLQ Eye Exam: 05/10/2020 [facility] Follow up in 1 year.  Patient was given a Plaquenil eye exam form to take with her to her upcoming appointment today.  Inadequate response to methotrexate, Enbrel, Humira, Orencia-hives.  CBC and CMP updated on  02/08/2021.  She is due to update lab work today.  Orders for CBC and CMP were released.  TB Gold negative on 10/20/2020.  Lipid panel within normal limits on 10/20/2020.- Plan: CBC with Differential/Platelet, COMPLETE METABOLIC PANEL WITH GFR She has not had any recent infections.  Discussed the importance of holding rinvoq if she develops signs or symptoms of an infection and to resume once the infection has completely cleared.   Primary osteoarthritis of both feet: She is not having any discomfort in the left foot.  She has been experiencing intermittent pain in the right ankle joints, which is exacerbated by weather changes. She described the pain as a throbbing sensation especially at night.  She has good ROM of the right ankle joint on exam today.  Some tenderness on the anterolateral aspect of the right ankle noted. No synovitis or warmth noted.  No evidence of achilles tendonitis or plantar fasciitis.   Sed rate will be ordered today.  Advised the patient to notify us if her right ankle joint pain persists or worsens.   Trochanteric bursitis of both hips: No tenderness to palpation on exam today. She has good ROM of both hip joints. No groin pain.   Chronic bilateral low back pain without sciatica -She presents today with increased low back pain over the past several months.  Her discomfort has been intermittent but severe at times. She is not experiencing any symptoms of radiculopathy at this time.  No muscle spasms. Her low back pain is exacerbated by weather changes.  She has not had any injury or  fall.  She continues to exercise 6 days a week.  X-rays of the pelvis and lumbar spine were obtained today.  Discussed that her discomfort is unrelated to rheumatoid arthritis.  Discussed physical therapy as well as a referral to orthopedics. Plan: XR Pelvis 1-2 Views, XR Lumbar Spine 2-3 Views  Other medical conditions are listed as follows:   Dry scalp: Unchanged   Lateral epicondylitis, right elbow  - Resolved   Bruising: Unchanged   Excessive thirst  Weight gain: She has followed up with her PCP regarding the ongoing weight gain despite exercising 6 days a week.  According to the patient she was told her weight gain is likely due to going through menopause.   History of gastroesophageal reflux (GERD)  History of asthma  Decreased cardiac ejection fraction  History of hypothyroidism  Hot flashes  Hx of migraines    Orders: Orders Placed This Encounter  Procedures   XR Pelvis 1-2 Views   XR Lumbar Spine 2-3 Views   CBC with Differential/Platelet   COMPLETE METABOLIC PANEL WITH GFR   Sedimentation rate   No orders of the defined types were placed in this encounter.     Follow-Up Instructions: Return for Rheumatoid arthritis.   Ofilia Neas, PA-C  Note - This record has been created using Dragon software.  Chart creation errors have been sought, but may not always  have been located. Such creation errors do not reflect on  the standard of medical care.

## 2021-05-12 ENCOUNTER — Other Ambulatory Visit: Payer: Self-pay

## 2021-05-12 ENCOUNTER — Encounter: Payer: Self-pay | Admitting: Physician Assistant

## 2021-05-12 ENCOUNTER — Ambulatory Visit (INDEPENDENT_AMBULATORY_CARE_PROVIDER_SITE_OTHER): Payer: 59 | Admitting: Physician Assistant

## 2021-05-12 ENCOUNTER — Ambulatory Visit: Payer: Self-pay

## 2021-05-12 VITALS — BP 116/79 | HR 89 | Ht 67.0 in | Wt 206.6 lb

## 2021-05-12 DIAGNOSIS — R238 Other skin changes: Secondary | ICD-10-CM

## 2021-05-12 DIAGNOSIS — M19071 Primary osteoarthritis, right ankle and foot: Secondary | ICD-10-CM | POA: Diagnosis not present

## 2021-05-12 DIAGNOSIS — M7061 Trochanteric bursitis, right hip: Secondary | ICD-10-CM | POA: Diagnosis not present

## 2021-05-12 DIAGNOSIS — R635 Abnormal weight gain: Secondary | ICD-10-CM

## 2021-05-12 DIAGNOSIS — R232 Flushing: Secondary | ICD-10-CM

## 2021-05-12 DIAGNOSIS — M545 Low back pain, unspecified: Secondary | ICD-10-CM

## 2021-05-12 DIAGNOSIS — Z8719 Personal history of other diseases of the digestive system: Secondary | ICD-10-CM

## 2021-05-12 DIAGNOSIS — Z79899 Other long term (current) drug therapy: Secondary | ICD-10-CM

## 2021-05-12 DIAGNOSIS — R631 Polydipsia: Secondary | ICD-10-CM

## 2021-05-12 DIAGNOSIS — Z8669 Personal history of other diseases of the nervous system and sense organs: Secondary | ICD-10-CM

## 2021-05-12 DIAGNOSIS — Z8709 Personal history of other diseases of the respiratory system: Secondary | ICD-10-CM

## 2021-05-12 DIAGNOSIS — M0609 Rheumatoid arthritis without rheumatoid factor, multiple sites: Secondary | ICD-10-CM | POA: Diagnosis not present

## 2021-05-12 DIAGNOSIS — T148XXA Other injury of unspecified body region, initial encounter: Secondary | ICD-10-CM

## 2021-05-12 DIAGNOSIS — M7711 Lateral epicondylitis, right elbow: Secondary | ICD-10-CM

## 2021-05-12 DIAGNOSIS — M5459 Other low back pain: Secondary | ICD-10-CM

## 2021-05-12 DIAGNOSIS — G8929 Other chronic pain: Secondary | ICD-10-CM

## 2021-05-12 DIAGNOSIS — M19072 Primary osteoarthritis, left ankle and foot: Secondary | ICD-10-CM

## 2021-05-12 DIAGNOSIS — R931 Abnormal findings on diagnostic imaging of heart and coronary circulation: Secondary | ICD-10-CM

## 2021-05-12 DIAGNOSIS — M7062 Trochanteric bursitis, left hip: Secondary | ICD-10-CM

## 2021-05-12 DIAGNOSIS — Z8639 Personal history of other endocrine, nutritional and metabolic disease: Secondary | ICD-10-CM

## 2021-05-12 LAB — CBC WITH DIFFERENTIAL/PLATELET
Absolute Monocytes: 383 cells/uL (ref 200–950)
Basophils Absolute: 43 cells/uL (ref 0–200)
Basophils Relative: 0.6 %
Eosinophils Absolute: 78 cells/uL (ref 15–500)
Eosinophils Relative: 1.1 %
HCT: 40.8 % (ref 35.0–45.0)
Hemoglobin: 13.3 g/dL (ref 11.7–15.5)
Lymphs Abs: 2570 cells/uL (ref 850–3900)
MCH: 28.9 pg (ref 27.0–33.0)
MCHC: 32.6 g/dL (ref 32.0–36.0)
MCV: 88.7 fL (ref 80.0–100.0)
MPV: 11.3 fL (ref 7.5–12.5)
Monocytes Relative: 5.4 %
Neutro Abs: 4026 cells/uL (ref 1500–7800)
Neutrophils Relative %: 56.7 %
Platelets: 311 10*3/uL (ref 140–400)
RBC: 4.6 10*6/uL (ref 3.80–5.10)
RDW: 13 % (ref 11.0–15.0)
Total Lymphocyte: 36.2 %
WBC: 7.1 10*3/uL (ref 3.8–10.8)

## 2021-05-12 LAB — COMPLETE METABOLIC PANEL WITH GFR
AG Ratio: 2 (calc) (ref 1.0–2.5)
ALT: 31 U/L — ABNORMAL HIGH (ref 6–29)
AST: 38 U/L — ABNORMAL HIGH (ref 10–35)
Albumin: 4.6 g/dL (ref 3.6–5.1)
Alkaline phosphatase (APISO): 58 U/L (ref 37–153)
BUN: 17 mg/dL (ref 7–25)
CO2: 28 mmol/L (ref 20–32)
Calcium: 9.3 mg/dL (ref 8.6–10.4)
Chloride: 104 mmol/L (ref 98–110)
Creat: 0.91 mg/dL (ref 0.50–1.03)
Globulin: 2.3 g/dL (calc) (ref 1.9–3.7)
Glucose, Bld: 65 mg/dL (ref 65–99)
Potassium: 4.3 mmol/L (ref 3.5–5.3)
Sodium: 140 mmol/L (ref 135–146)
Total Bilirubin: 0.6 mg/dL (ref 0.2–1.2)
Total Protein: 6.9 g/dL (ref 6.1–8.1)
eGFR: 75 mL/min/{1.73_m2} (ref >=60)

## 2021-05-12 LAB — SEDIMENTATION RATE: Sed Rate: 6 mm/h (ref 0–30)

## 2021-05-12 NOTE — Progress Notes (Signed)
I reviewed the x-rays with the patient in person.  All questions were addressed.  She declined a referral to a spine specialist or PT at this time.     Ok to send in refill for meloxicam 7.5 mg BID PRN for pain relief pending lab results.  Dispense 30 tablets with zero refills.  She is aware that she should only take meloxicam as needed.  We will continue to monitor lab work closely.

## 2021-05-13 ENCOUNTER — Telehealth: Payer: Self-pay | Admitting: *Deleted

## 2021-05-13 DIAGNOSIS — M545 Low back pain, unspecified: Secondary | ICD-10-CM

## 2021-05-13 DIAGNOSIS — G8929 Other chronic pain: Secondary | ICD-10-CM

## 2021-05-13 NOTE — Telephone Encounter (Signed)
Referral placed.

## 2021-05-13 NOTE — Telephone Encounter (Signed)
Okay to place referral

## 2021-05-13 NOTE — Progress Notes (Signed)
CBC and CMP are normal.  LFTs are mildly elevated.  Patient should avoid use of meloxicam or any other NSAIDs.

## 2021-05-31 NOTE — Progress Notes (Deleted)
Office Visit Note  Patient: [name]             Date of Birth: [birth year]           MRN: [id number]             PCP: Fredirick Lathe, PA-C Referring: Orma Flaming, MD Visit Date: 06/14/2021 Occupation: '@GUAROCC'$ @  Subjective:  No chief complaint on file.   History of Present Illness: [name] is a 55 y.o. female ***   Activities of Daily Living:  Patient reports morning stiffness for *** {minute/hour:19697}.   Patient {ACTIONS;DENIES/REPORTS:21021675::"Denies"} nocturnal pain.  Difficulty dressing/grooming: {ACTIONS;DENIES/REPORTS:21021675::"Denies"} Difficulty climbing stairs: {ACTIONS;DENIES/REPORTS:21021675::"Denies"} Difficulty getting out of chair: {ACTIONS;DENIES/REPORTS:21021675::"Denies"} Difficulty using hands for taps, buttons, cutlery, and/or writing: {ACTIONS;DENIES/REPORTS:21021675::"Denies"}  No Rheumatology ROS completed.   PMFS History:  Patient Active Problem List   Diagnosis Date Noted  . Status post surgery 06/14/2020  . Genetic testing 02/18/2020  . Family history of breast cancer   . High risk medication use 10/30/2019  . Left bundle branch block 04/28/2019  . Right wrist pain 03/13/2019  . Hamstring strain, left, initial encounter 02/20/2019  . 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 Primary osteoarthritis of both feet 10/09/2018  . Hx of migraines 09/27/2018  . History of gastroesophageal reflux (GERD) 09/27/2018  . 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 Moderate persistent asthma 07/05/2018  . Hypothyroid 06/28/2018    Past Medical History:  Diagnosis Date  . Arthritis    ra zero negative  . Asthma    mild/moderate persistent asthma  . Complication of anesthesia    slow to awaken 25 yrs ago  . Elevated liver function tests dr Neil Crouch pcp manages   for last year  . Family history of breast cancer   . Fibroid   . Frequent headaches   . GERD (gastroesophageal reflux disease)   . Gluten intolerance    . H/O seasonal allergies   . History of chicken pox   . History of COVID-19 07/2019   high fever sob, x 7 days all symptoms resolved  . Left bundle branch block 02/2019   Dr.Paula Ross  . Migraines   . PONV (postoperative nausea and vomiting)    ponv with several surgeries, likes scopolamine patch  . Thyroid disease    Multiple benign nodules--thyroid removed    Family History  Problem Relation Age of Onset  . Arthritis Mother   . Cancer Mother 33       breast ca--dec age 56  . Miscarriages / [place] Mother   . Breast cancer Mother 65  . Diabetes Father   . Heart attack Father   . Heart disease Father        pacemaker   . Hyperlipidemia Father   . Hypertension Father   . Stroke Father        x4  . COPD Sister   . Peripheral Artery Disease Sister   . Asthma Brother   . Birth defects Brother   . Cancer Brother        skin  . Hyperlipidemia Brother   . Heart attack Maternal Grandmother   . Heart disease Maternal Grandmother   . Hyperlipidemia Maternal Grandmother   . Stroke Maternal Grandmother   . Alcohol abuse Paternal Grandmother   . Cancer Paternal Grandmother        colon  . Cancer Paternal Grandfather        breast  . Heart attack Paternal Grandfather   . Breast cancer  Paternal Grandfather 78  . Alcohol abuse Brother   . Early death Brother        suicide  . Mental illness Brother   . Asthma Daughter   . Asthma Son   . Asthma Son   . Breast cancer Maternal Aunt 60  . Kidney cancer Paternal Aunt 85  . Breast cancer Paternal Aunt 16  . Throat cancer Paternal Uncle   . Thyroid cancer Paternal Uncle   . Breast cancer Cousin 49  . Prostate cancer Cousin    Past Surgical History:  Procedure Laterality Date  . ANTERIOR AND POSTERIOR REPAIR N/A 06/14/2020   Procedure: ANTERIOR (CYSTOCELE) AND POSTERIOR REPAIR (RECTOCELE);  Surgeon: Nunzio Cobbs, MD;  Location: [facility];  Service: Gynecology;  Laterality: N/A;  . BLADDER  SUSPENSION N/A 06/14/2020   Procedure: TRANSVAGINAL TAPE (TVT) PROCEDURE/EXACT MIDURETHRAL SLING;  Surgeon: Nunzio Cobbs, MD;  Location: [facility];  Service: Gynecology;  Laterality: N/A;  EXACT MIDURETHRAL SLING  . CARDIAC CATHETERIZATION  2009   results normal done in [place]  . CHOLECYSTECTOMY  1995   laparoscopic  . CYSTOSCOPY N/A 06/14/2020   Procedure: CYSTOSCOPY;  Surgeon: Nunzio Cobbs, MD;  Location: [facility];  Service: Gynecology;  Laterality: N/A;  . HAMMER TOE SURGERY Bilateral 2009  . HERNIA REPAIR  2015   Has abdominal wall mesh - [place], [place].  Laparoscopic incisional hernia repari with 7 x 9 inch Ventralight ST with Echo mesh  . LAPAROSCOPIC HYSTERECTOMY  2007   Supracervical hysterectomy with cystosctopy - [place], [place] partial  . THYROID SURGERY  2013   total thyroidectomy due to 11 nodules   Social History   Social History Narrative   Right Handed   Lives in a two story home   Immunization History  Administered Date(s) Administered  . Influenza,inj,Quad PF,6+ Mos 06/28/2018, 06/18/2019  . Influenza-Unspecified 01/25/2018, 08/05/2020  . Moderna Sars-Covid-2 Vaccination 01/27/2021  . PFIZER(Purple Top)SARS-COV-2 Vaccination 12/08/2019, 01/05/2020, 08/05/2020  . Pneumococcal Polysaccharide-23 10/22/2018  . Tdap 06/28/2018  . Zoster Recombinat (Shingrix) 04/15/2019, 07/16/2019     Objective: Vital Signs: LMP  (LMP Unknown)    Physical Exam   Musculoskeletal Exam: ***  CDAI Exam: CDAI Score: -- Patient Global: --; Provider Global: -- Swollen: --; Tender: -- Joint Exam 06/14/2021   No joint exam has been documented for this visit   There is currently no information documented on the homunculus. Go to the Rheumatology activity and complete the homunculus joint exam.  Investigation: No additional findings.  Imaging: XR Lumbar Spine 2-3 Views  Result Date: 05/12/2021 Dextroscoliosis was  noted.  Multilevel spondylosis with narrowing of L1-L2, L3-L4, L4-L5 and L5-S1 was noted.  Facet joint arthropathy was noted. 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  XR Pelvis 1-2 Views  Result Date: 05/12/2021 No hip joint narrowing was noted.  No SI joint narrowing was noted.  Mild scoliosis was noted. Impression: Unremarkable x-ray of the hip joints.  Mild degenerative changes of the SI joints were noted.   Recent Labs: Lab Results  Component Value Date   WBC 7.1 05/12/2021   HGB 13.3 05/12/2021   PLT 311 05/12/2021   NA 140 05/12/2021   K 4.3 05/12/2021   CL 104 05/12/2021   CO2 28 05/12/2021   GLUCOSE 65 05/12/2021   BUN 17 05/12/2021   CREATININE 0.91 05/12/2021   BILITOT 0.6 05/12/2021  ALKPHOS 54 07/27/2020   AST 38 (H) 05/12/2021   ALT 31 (H) 05/12/2021   PROT 6.9 05/12/2021   ALBUMIN 4.0 07/27/2020   CALCIUM 9.3 05/12/2021   GFRAA 94 02/08/2021   QFTBGOLDPLUS NEGATIVE 10/20/2020    Speciality Comments: PLQ Eye Exam: 05/12/2021 [facility] Follow up in 1 year.   Inadequate response to methotrexate, Enbrel, Humira, Orencia-hives everywhere  Procedures:  No procedures performed Allergies: Orencia [abatacept], Shellfish allergy, Strawberry (diagnostic), Gluten meal, Lortab [hydrocodone-acetaminophen], Morphine and related, and Augmentin [amoxicillin-pot clavulanate]   Assessment / Plan:     Visit Diagnoses: No diagnosis found.  Orders: No orders of the defined types were placed in this encounter.  No orders of the defined types were placed in this encounter.   Face-to-face time spent with patient was *** minutes. Greater than 50% of time was spent in counseling and coordination of care.  Follow-Up Instructions: No follow-ups on file.   Earnestine Mealing, CMA  Note - This record has been created using Editor, commissioning.  Chart creation errors have been sought, but may not always   have been located. Such creation errors do not reflect on  the standard of medical care.

## 2021-06-09 ENCOUNTER — Other Ambulatory Visit: Payer: Self-pay | Admitting: Physician Assistant

## 2021-06-09 ENCOUNTER — Telehealth: Payer: Self-pay

## 2021-06-09 DIAGNOSIS — M1230 Palindromic rheumatism, unspecified site: Secondary | ICD-10-CM

## 2021-06-09 NOTE — Telephone Encounter (Signed)
Spoke with patient and advised the referral was received by Dr. Melven Sartorius office. Patient advised it may be best for her to call and see if she can get her appointment scheduled. Provided patient with number. Patient will call back to advise [place] of when appointment is scheduled for.

## 2021-06-09 NOTE — Telephone Encounter (Signed)
Patient called stating Dr. Estanislado Pandy referred her to see a neurologist, but hasn't received a call from their office to schedule an appointment.  Patient states Dr. Estanislado Pandy wanted her to see the neurologist before she returns so she cancelled her appointment on 06/14/21.  Patient states once she schedules an appointment with the neurologist she will call back to reschedule.

## 2021-06-09 NOTE — Telephone Encounter (Signed)
Next Visit: 06/14/2021  Last Visit: 05/12/2021  Labs: 05/12/2021 CBC and CMP are normal  Eye exam:  05/12/2021 WNL   Current Dose per office note 05/12/2021: Plaquenil 200 mg 1 tablet by mouth twice daily  [id number] arthritis of multiple sites with negative rheumatoid factor   Last Fill: 02/28/2021  Okay to refill Plaquenil?

## 2021-06-14 ENCOUNTER — Ambulatory Visit: Payer: No Typology Code available for payment source | Admitting: Rheumatology

## 2021-06-14 DIAGNOSIS — R238 Other skin changes: Secondary | ICD-10-CM

## 2021-06-14 DIAGNOSIS — Z79899 Other long term (current) drug therapy: Secondary | ICD-10-CM

## 2021-06-14 DIAGNOSIS — M7711 Lateral epicondylitis, right elbow: Secondary | ICD-10-CM

## 2021-06-14 DIAGNOSIS — M0609 Rheumatoid arthritis without rheumatoid factor, multiple sites: Secondary | ICD-10-CM

## 2021-06-14 DIAGNOSIS — R631 Polydipsia: Secondary | ICD-10-CM

## 2021-06-14 DIAGNOSIS — T148XXA Other injury of unspecified body region, initial encounter: Secondary | ICD-10-CM

## 2021-06-14 DIAGNOSIS — R635 Abnormal weight gain: Secondary | ICD-10-CM

## 2021-06-14 DIAGNOSIS — Z8719 Personal history of other diseases of the digestive system: Secondary | ICD-10-CM

## 2021-06-14 DIAGNOSIS — Z8669 Personal history of other diseases of the nervous system and sense organs: Secondary | ICD-10-CM

## 2021-06-14 DIAGNOSIS — R232 Flushing: Secondary | ICD-10-CM

## 2021-06-14 DIAGNOSIS — R931 Abnormal findings on diagnostic imaging of heart and coronary circulation: Secondary | ICD-10-CM

## 2021-06-14 DIAGNOSIS — M7061 Trochanteric bursitis, right hip: Secondary | ICD-10-CM

## 2021-06-14 DIAGNOSIS — G8929 Other chronic pain: Secondary | ICD-10-CM

## 2021-06-14 DIAGNOSIS — Z8709 Personal history of other diseases of the respiratory system: Secondary | ICD-10-CM

## 2021-06-14 DIAGNOSIS — Z8639 Personal history of other endocrine, nutritional and metabolic disease: Secondary | ICD-10-CM

## 2021-06-14 DIAGNOSIS — M19072 Primary osteoarthritis, left ankle and foot: Secondary | ICD-10-CM

## 2021-06-17 ENCOUNTER — Other Ambulatory Visit: Payer: Self-pay | Admitting: Family Medicine

## 2021-06-23 ENCOUNTER — Encounter: Payer: Self-pay | Admitting: Physician Assistant

## 2021-06-23 ENCOUNTER — Other Ambulatory Visit: Payer: Self-pay

## 2021-06-23 MED ORDER — LEVOTHYROXINE SODIUM 125 MCG PO TABS: ORAL_TABLET | ORAL | 0 refills | Status: DC

## 2021-06-23 MED ORDER — GABAPENTIN 800 MG PO TABS: 800.0000 mg | ORAL_TABLET | Freq: Every day | ORAL | 1 refills | Status: DC

## 2021-06-23 MED ORDER — FEXOFENADINE-PSEUDOEPHED ER 180-240 MG PO TB24: 1.0000 | ORAL_TABLET | Freq: Every day | ORAL | 4 refills | Status: DC

## 2021-07-06 ENCOUNTER — Other Ambulatory Visit: Payer: Self-pay | Admitting: Physician Assistant

## 2021-07-06 DIAGNOSIS — M0609 Rheumatoid arthritis without rheumatoid factor, multiple sites: Secondary | ICD-10-CM

## 2021-07-06 NOTE — Telephone Encounter (Signed)
Next Visit: 06/14/2021   Last Visit: 05/12/2021   Labs: 05/12/2021 CBC and CMP are normal  Current Dose per office note 05/12/2021: Rinvoq 15 mg 1 tablet by mouth daily    DS:[id number] arthritis of multiple sites with negative rheumatoid factor    Last Fill: 04/20/2021  Okay to refill Rinvoq?

## 2021-07-27 ENCOUNTER — Other Ambulatory Visit: Payer: Self-pay | Admitting: Family Medicine

## 2021-07-27 ENCOUNTER — Ambulatory Visit (INDEPENDENT_AMBULATORY_CARE_PROVIDER_SITE_OTHER): Payer: 59 | Admitting: Sports Medicine

## 2021-07-27 ENCOUNTER — Ambulatory Visit (INDEPENDENT_AMBULATORY_CARE_PROVIDER_SITE_OTHER): Payer: 59

## 2021-07-27 ENCOUNTER — Other Ambulatory Visit: Payer: Self-pay

## 2021-07-27 VITALS — BP 122/80 | HR 90 | Ht 67.0 in | Wt 213.0 lb

## 2021-07-27 DIAGNOSIS — R0781 Pleurodynia: Secondary | ICD-10-CM

## 2021-07-27 MED ORDER — MELOXICAM 15 MG PO TABS: 15.0000 mg | ORAL_TABLET | Freq: Every day | ORAL | 0 refills | Status: DC

## 2021-07-27 MED ORDER — CYCLOBENZAPRINE HCL 10 MG PO TABS: ORAL_TABLET | ORAL | 0 refills | Status: DC

## 2021-07-27 NOTE — Progress Notes (Signed)
[name] D.[place] Phone: [phone]   Assessment and Plan:     1. Rib pain -Acute, initial visit - Unclear etiology of left lower rib pain with bruising - Concern for floating rib fracture based on cortical irregularity seen on rib x-ray, patient bruising over the left upper flank, pain with palpation of posterior ribs without flail chest, pain with inhalation - No evidence of pneumothorax on x-ray for physical exam, so patient is allowed to fly on her trip to [place] on 07[place]29/2021 - For pain control, start meloxicam 15 mg daily x2 weeks and use Tylenol as needed for breakthrough.  At 2-week mark start Tylenol total of 2000 mg daily for pain control and use meloxicam 15 mg as needed for breakthrough pain - Start Flexeril 10 mg nightly as needed for muscle spasms.  Refill provided - DG Ribs Bilateral W/Chest; Future    Pertinent previous records reviewed include none   Follow Up: 4 weeks for reevaluation   Subjective:   I, [name], am serving as a scribe for Dr. Glennon Mac  Chief Complaint: Left sided back pain   HPI:   07/27/21 Patient is a 55 year old female presenting with left sided mid back pain locates at the ribs. Patient states she heard a loud crack while stretching and since then it has been very painful with some bruising in the area especially with twisting motions or taking a deep breath, lying on either side, and going up steps. Patient is getting ready to go to [place] in three weeks and wanted to make sure she didn't do too much damage. Patient states pressure makes the pain feel better.     Numbness/tingling: no  Treatments tried: Advil, heat and ice    Relevant Historical Information: Palindromic rheumatism  Additional pertinent review of systems negative.   Current Outpatient Medications:    acetaminophen (TYLENOL) 500 MG tablet, Take 500 mg by mouth  every 6 (six) hours as needed., Disp: , Rfl:    ADVAIR DISKUS 250-50 MCG/DOSE AEPB, INHALE 1 PUFF BY MOUTH TWICE A DAY, Disp: 180 each, Rfl: 3   albuterol (VENTOLIN HFA) 108 (90 Base) MCG/ACT inhaler, TAKE 2 PUFFS BY MOUTH EVERY 6 HOURS AS NEEDED FOR WHEEZE OR SHORTNESS OF BREATH, Disp: 25.5 each, Rfl: 5   cholecalciferol (VITAMIN D) 400 units TABS tablet, Take 5,000 Units by mouth. , Disp: , Rfl:    fexofenadine-pseudoephedrine (ALLEGRA-D 24) 180-240 MG 24 hr tablet, Take 1 tablet by mouth daily., Disp: 30 tablet, Rfl: 4   fluticasone-salmeterol (ADVAIR HFA) 115-21 MCG/ACT inhaler, Inhale 2 puffs into the lungs 2 (two) times daily., Disp: 1 each, Rfl: 12   gabapentin (NEURONTIN) 800 MG tablet, Take 1 tablet (800 mg total) by mouth at bedtime., Disp: 90 tablet, Rfl: 1   hydroxychloroquine (PLAQUENIL) 200 MG tablet, TAKE 1 TABLET BY MOUTH TWICE A DAY, Disp: 180 tablet, Rfl: 0   levothyroxine (SYNTHROID) 125 MCG tablet, TAKE 1 TABLET BY MOUTH daily., Disp: 90 tablet, Rfl: 0   meloxicam (MOBIC) 15 MG tablet, Take 1 tablet (15 mg total) by mouth daily., Disp: 30 tablet, Rfl: 0   montelukast (SINGULAIR) 10 MG tablet, TAKE 1 TABLET BY MOUTH EVERYDAY AT BEDTIME, Disp: 90 tablet, Rfl: 3   RINVOQ 15 MG TB24, TAKE 1 TABLET DAILY., Disp: 30 tablet, Rfl: 2   rosuvastatin (CRESTOR) 5 MG tablet, TAKE 1/2 TABLET BY MOUTH DAILY, Disp: 45  tablet, Rfl: 3   cyclobenzaprine (FLEXERIL) 10 MG tablet, Daily at bed time as needed, Disp: 30 tablet, Rfl: 0   Objective:     Vitals:   07/27/21 1344  BP: 122/80  Pulse: 90  SpO2: 98%  Weight: 213 lb (96.6 kg)  Height: 5\' 7"  (1.702 m)      Body mass index is 33.36 kg/m.    Physical Exam:    Gen: Appears well, nad, nontoxic and pleasant Psych: Alert and oriented, appropriate mood and affect Neuro: sensation intact, strength is 5/5 in upper and lower extremities, muscle tone wnl Skin: no susupicious lesions or rashes Pulmonary: Clear to auscultation in all lung  fields.  No flail chest  Back - Normal skin, Spine with normal alignment and no deformity.   No tenderness to vertebral process palpation.   Paraspinous muscles are not tender on right and without spasm.  Paraspinal muscle tenderness with palpation from T 8-12 TTP posterior ribs 8-12 with superficial bruising concentrated around upper left flank Straight leg raise negative Trendelenberg negative     Electronically signed by:  [name] D.[facility] 2:32 PM 07/27/21

## 2021-07-27 NOTE — Patient Instructions (Signed)
Good to see you Meloxicam 15mg  daily for 2 weeks then take as needed Start taking tylenol up to 2000mg  daily as needed for breakthrough pain for 2 weeks then start taking tylenol 2000mg  daily Flexeril refilled to take nightly as needed  Follow up in 4 weeks

## 2021-07-28 ENCOUNTER — Ambulatory Visit: Payer: 59 | Admitting: Sports Medicine

## 2021-08-05 ENCOUNTER — Other Ambulatory Visit: Payer: Self-pay | Admitting: Internal Medicine

## 2021-08-23 NOTE — Progress Notes (Signed)
Benito Mccreedy D.[place] [facility] Phone: [phone]   Assessment and Plan:     1. Rib pain 2. Somatic dysfunction of thoracic region 3. Somatic dysfunction of lumbar region 4. Somatic dysfunction of pelvic region -Acute, improving, subsequent visit - Improved rib pain with Flexeril and meloxicam course - Discontinue daily meloxicam.  May use remainder as needed for breakthrough pain - Discontinue Flexeril - Somatic dysfunction likely result of compensation for patient protecting left lower rib cage - Patient has received significant relief with OMT in the past.  Elects for repeat OMT today.  Tolerated well per note below. - Decision today to treat with OMT was based on Physical Exam   After verbal consent patient was treated with HVLA (high velocity low amplitude), ME (muscle energy), FPR (flex positional release), ST (soft tissue), PC/PD (Pelvic Compression/ Pelvic Decompression) techniques in thoracic, lumbar, and pelvic areas. Patient tolerated the procedure well with improvement in symptoms.  Patient educated on potential side effects of soreness and recommended to rest, hydrate, and use Tylenol as needed for pain control.   Pertinent previous records reviewed include none    Follow Up: as needed if no improvement or worsening of symptoms     Subjective:    I, Moenique Parris, am serving as a Education administrator for Doctor Glennon Mac  Chief Complaint: left back pain   HPI:   07/27/21 Patient is a 55 year old female presenting with left sided mid back pain locates at the ribs. Patient states she heard a loud crack while stretching and since then it has been very painful with some bruising in the area especially with twisting motions or taking a deep breath, lying on either side, and going up steps. Patient is getting ready to go to [place] in three weeks and wanted to make sure she didn't do too much damage. Patient states  pressure makes the pain feel better.      Numbness/tingling: no  Treatments tried: Advil, heat and ice     08/24/2021 Patient states that she is doing a lot better since her last visit. States that she is able to lay on both sides now when she sleeps. Patient states that her left hip is being weird it will get stuck and then "pop out" when she walks.   Relevant Historical Information: palindromic rheumatism   Additional pertinent review of systems negative.  Current Outpatient Medications  Medication Sig Dispense Refill   acetaminophen (TYLENOL) 500 MG tablet Take 500 mg by mouth every 6 (six) hours as needed.     ADVAIR DISKUS 250-50 MCG/DOSE AEPB INHALE 1 PUFF BY MOUTH TWICE A DAY 180 each 3   albuterol (VENTOLIN HFA) 108 (90 Base) MCG/ACT inhaler TAKE 2 PUFFS BY MOUTH EVERY 6 HOURS AS NEEDED FOR WHEEZE OR SHORTNESS OF BREATH 25.5 each 5   cholecalciferol (VITAMIN D) 400 units TABS tablet Take 5,000 Units by mouth.      cyclobenzaprine (FLEXERIL) 10 MG tablet Daily at bed time as needed 30 tablet 0   fexofenadine-pseudoephedrine (ALLEGRA-D 24) 180-240 MG 24 hr tablet Take 1 tablet by mouth daily. 30 tablet 4   fluticasone-salmeterol (ADVAIR HFA) 115-21 MCG/ACT inhaler Inhale 2 puffs into the lungs 2 (two) times daily. 1 each 12   gabapentin (NEURONTIN) 800 MG tablet Take 1 tablet (800 mg total) by mouth at bedtime. 90 tablet 1   hydroxychloroquine (PLAQUENIL) 200 MG tablet TAKE 1 TABLET BY MOUTH TWICE A  DAY 180 tablet 0   levothyroxine (SYNTHROID) 125 MCG tablet TAKE 1 TABLET BY MOUTH daily. 90 tablet 0   meloxicam (MOBIC) 15 MG tablet Take 1 tablet (15 mg total) by mouth daily. 30 tablet 0   montelukast (SINGULAIR) 10 MG tablet TAKE 1 TABLET BY MOUTH EVERYDAY AT BEDTIME 90 tablet 3   RINVOQ 15 MG TB24 TAKE 1 TABLET DAILY. 30 tablet 2   rosuvastatin (CRESTOR) 5 MG tablet Take 0.5 tablets (2.5 mg total) by mouth daily. Please keep upcoming appt in February 2023 with Dr. Harrington Challenger before  anymore refills. Thank you 45 tablet 0   No current facility-administered medications for this visit.      Objective:     Vitals:   08/24/21 1401  BP: 130/80  Pulse: 90  SpO2: 99%  Weight: 203 lb (92.1 kg)  Height: 5\' 7"  (1.702 m)      Body mass index is 31.79 kg/m.    Physical Exam:     General: Well-appearing, cooperative, sitting comfortably in no acute distress.   MSK: NTTP left-sided rib cage without flail chest, ecchymosis. Pulmonary: Unlabored breathing OMT Physical Exam:  ASIS Compression Test: Positive left   Thoracic: nTTP paraspinal, T6-8 RRSL Lumbar: nTTP paraspinal, L1 RRSR Pelvis: Left anterior innominate  Electronically signed by:  Benito Mccreedy D.[facility] 2:20 PM 08/24/21

## 2021-08-24 ENCOUNTER — Ambulatory Visit (INDEPENDENT_AMBULATORY_CARE_PROVIDER_SITE_OTHER): Payer: 59 | Admitting: Sports Medicine

## 2021-08-24 ENCOUNTER — Other Ambulatory Visit: Payer: Self-pay

## 2021-08-24 VITALS — BP 130/80 | HR 90 | Ht 67.0 in | Wt 203.0 lb

## 2021-08-24 DIAGNOSIS — M9903 Segmental and somatic dysfunction of lumbar region: Secondary | ICD-10-CM | POA: Diagnosis not present

## 2021-08-24 DIAGNOSIS — M9902 Segmental and somatic dysfunction of thoracic region: Secondary | ICD-10-CM

## 2021-08-24 DIAGNOSIS — M9905 Segmental and somatic dysfunction of pelvic region: Secondary | ICD-10-CM

## 2021-08-24 DIAGNOSIS — R0781 Pleurodynia: Secondary | ICD-10-CM

## 2021-08-24 NOTE — Patient Instructions (Addendum)
Good to see you  Follow up as needed  

## 2021-08-28 ENCOUNTER — Other Ambulatory Visit: Payer: Self-pay | Admitting: Sports Medicine

## 2021-09-01 ENCOUNTER — Encounter: Payer: Self-pay | Admitting: Obstetrics and Gynecology

## 2021-09-02 NOTE — Telephone Encounter (Signed)
Dr.Silva I know you prefer office visits. Do you prefer she schedule office visit next week?

## 2021-09-02 NOTE — Telephone Encounter (Signed)
Patient can try over the counter Monistat 1, 3 or 7 day treatment.  If she would like Diflucan, please schedule an office visit.

## 2021-09-07 ENCOUNTER — Other Ambulatory Visit: Payer: Self-pay | Admitting: Physician Assistant

## 2021-09-07 ENCOUNTER — Telehealth: Payer: Self-pay

## 2021-09-07 DIAGNOSIS — M1230 Palindromic rheumatism, unspecified site: Secondary | ICD-10-CM

## 2021-09-07 NOTE — Telephone Encounter (Signed)
Please schedule patient for a follow up visit. Patient due December 2022. Thanks!

## 2021-09-07 NOTE — Telephone Encounter (Signed)
PA renewal initiated automatically by CoverMyMeds.  Received notification from [employer] regarding a prior authorization for [facility]. Authorization has been APPROVED from 08/08/2021 to 09/07/2022.   Authorization # [id number] Key: [id number]

## 2021-09-07 NOTE — Telephone Encounter (Signed)
Next Visit: Due December 2022. Message sent to the front to schedule.    Last Visit: 05/12/2021   Labs: 05/12/2021 CBC and CMP are normal   Eye exam:  05/12/2021 WNL    Current Dose per office note 05/12/2021: Plaquenil 200 mg 1 tablet by mouth twice daily   UE:[id number] arthritis of multiple sites with negative rheumatoid factor    Last Fill: 06/09/2021   Okay to refill Plaquenil?

## 2021-09-14 NOTE — Progress Notes (Signed)
Office Visit Note  Patient: [name]             Date of Birth: [birth year]           MRN: [id number]             PCP: Fredirick Lathe, PA-C Referring: Allwardt, Randa Evens, PA-C Visit Date: 09/20/2021 Occupation: @GUAROCC @  Subjective:  Pain in both hands   History of Present Illness: [name] is a 55 y.o. female with history of seronegative rheumatoid arthritis and osteoarthritis.  She is currently taking rinvoq 15 mg 1 tablet by mouth daily and plaquenil 200 mg 1 tablet by mouth twice daily Monday through Friday.  Patient reports that in November she went to go straight there for 3 weeks.  She states that while there she developed an E. coli infection.  She held rinvoq for about 1 week during that time but continued on Plaquenil as prescribed.  She states that for the past 1 week she has been experiencing increased joint pain especially in both hands and her lower back.  She denies any increased joint swelling at this time.  She continues to experience intermittent discomfort in her right breast and right ankle.  She remains active exercising on a regular basis.  She is planning on increasing her exercise regimen.  She states that despite maintaining her exercise regimen she continues to have weight gain.  She is unsure if the weight gain is due to taking gabapentin or Rinvoq.  Activities of Daily Living:  Patient reports morning stiffness for all day. Patient Reports nocturnal pain.  Difficulty dressing/grooming: Reports Difficulty climbing stairs: Denies Difficulty getting out of chair: Denies Difficulty using hands for taps, buttons, cutlery, and/or writing: Reports  Review of Systems  Constitutional:  Negative for fatigue.  HENT:  Positive for mouth sores. Negative for mouth dryness and nose dryness.   Eyes:  Negative for pain and itching.  Respiratory:  Positive for shortness of breath and difficulty breathing.   Cardiovascular:  Negative for chest pain and  palpitations.  Gastrointestinal:  Negative for blood in stool, constipation and diarrhea.  Endocrine: Negative for increased urination.  Genitourinary:  Negative for difficulty urinating.  Musculoskeletal:  Positive for joint pain, joint pain, joint swelling and morning stiffness. Negative for myalgias, muscle tenderness and myalgias.  Skin:  Positive for redness. Negative for color change and rash.  Allergic/Immunologic: Negative for susceptible to infections.  Neurological:  Positive for numbness. Negative for dizziness, headaches, memory loss and weakness.  Hematological:  Positive for bruising/bleeding tendency.  Psychiatric/Behavioral:  Negative for confusion.    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   Moderate persistent asthma 07/05/2018   Hypothyroid 06/28/2018    Past Medical History:  Diagnosis Date   Arthritis    ra zero negative   Asthma    mild/moderate persistent asthma   Complication of anesthesia    slow to awaken 25 yrs ago   Elevated liver function tests dr Anner Crete  [facility] pcp manages   for last year   Family history of breast cancer    Fibroid    Fractured rib    Frequent headaches    GERD (gastroesophageal reflux disease)    Gluten intolerance    H/O seasonal allergies    History of chicken pox    History of COVID-19 07/2019   high fever sob, x 7 days all symptoms resolved   Left bundle branch block 02/2019   Dr.Paula Ross   Migraines    PONV  (postoperative nausea and vomiting)    ponv with several surgeries, likes scopolamine patch   Thyroid disease    Multiple benign nodules--thyroid removed    Family History  Problem Relation Age of Onset   Arthritis Mother    Cancer Mother 3       breast ca--dec age 41   Miscarriages / [place] Mother    Breast cancer Mother 62   Diabetes Father    Heart attack Father    Heart disease Father        pacemaker    Hyperlipidemia Father    Hypertension Father    Stroke Father        x4   COPD Sister    Peripheral Artery Disease Sister    Asthma Brother    Birth defects Brother    Cancer Brother        skin   Hyperlipidemia Brother    Heart attack Maternal Grandmother    Heart disease Maternal Grandmother    Hyperlipidemia Maternal Grandmother    Stroke Maternal Grandmother    Alcohol abuse Paternal Grandmother    Cancer Paternal Grandmother        colon   Cancer Paternal Grandfather        breast   Heart attack Paternal Grandfather    Breast cancer Paternal Grandfather 94   Alcohol abuse Brother    Early death Brother        suicide   Mental illness Brother    Asthma Daughter    Asthma Son    Asthma Son    Breast cancer Maternal Aunt 60   Kidney cancer Paternal Aunt 85   Breast cancer Paternal Aunt 57   Throat cancer Paternal Uncle    Thyroid cancer Paternal Uncle    Breast cancer Cousin 45   Prostate cancer Cousin    Past Surgical History:  Procedure Laterality Date   ANTERIOR AND POSTERIOR REPAIR N/A 06/14/2020   Procedure: ANTERIOR (CYSTOCELE) AND POSTERIOR REPAIR (RECTOCELE);  Surgeon: Nunzio Cobbs, MD;  Location: [facility];  Service: Gynecology;  Laterality: N/A;   BLADDER SUSPENSION N/A 06/14/2020   Procedure: TRANSVAGINAL TAPE (TVT) PROCEDURE/EXACT MIDURETHRAL SLING;  Surgeon: Nunzio Cobbs, MD;  Location: [facility];  Service: Gynecology;  Laterality: N/A;  EXACT MIDURETHRAL SLING   CARDIAC  CATHETERIZATION  2009   results normal done in [place]   CHOLECYSTECTOMY  1995   laparoscopic   CYSTOSCOPY N/A 06/14/2020   Procedure: CYSTOSCOPY;  Surgeon: Nunzio Cobbs, MD;  Location: [facility];  Service: Gynecology;  Laterality: N/A;   HAMMER TOE SURGERY Bilateral 2009   HERNIA REPAIR  2015   Has abdominal wall mesh - [place], [place].  Laparoscopic incisional hernia repari with 7 x 9 inch Ventralight ST with Echo mesh   LAPAROSCOPIC HYSTERECTOMY  2007   Supracervical hysterectomy with cystosctopy - [place], [place] partial   THYROID  SURGERY  2013   total thyroidectomy due to 11 nodules   Social History   Social History Narrative   Right Handed   Lives in a two story home   Immunization History  Administered Date(s) Administered   Influenza,inj,Quad PF,6+ Mos 06/28/2018, 06/18/2019   Influenza-Unspecified 01/25/2018, 08/05/2020   Moderna Sars-Covid-2 Vaccination 01/27/2021   PFIZER(Purple Top)SARS-COV-2 Vaccination 12/08/2019, 01/05/2020, 08/05/2020   Pneumococcal Polysaccharide-23 10/22/2018   Tdap 06/28/2018   Zoster Recombinat (Shingrix) 04/15/2019, 07/16/2019     Objective: Vital Signs: BP 125/78 (BP Location: Left Arm, Patient Position: Sitting, Cuff Size: Normal)    Pulse 96    Ht 5' 7"  (1.702 m)    Wt 208 lb 3.2 oz (94.4 kg)    LMP  (LMP Unknown)    BMI 32.61 kg/m    Physical Exam Vitals and nursing note reviewed.  Constitutional:      Appearance: She is well-developed.  HENT:     Head: Normocephalic and atraumatic.  Eyes:     Conjunctiva/sclera: Conjunctivae normal.  Pulmonary:     Effort: Pulmonary effort is normal.  Abdominal:     Palpations: Abdomen is soft.  Musculoskeletal:     Cervical back: Normal range of motion.  Skin:    General: Skin is warm and dry.     Capillary Refill: Capillary refill takes less than 2 seconds.  Neurological:     Mental Status: She is alert and oriented to person, place, and time.  Psychiatric:         Behavior: Behavior normal.     Musculoskeletal Exam: C-spine, thoracic spine, lumbar spine have good range of motion.  Some tenderness over both SI joints.  Shoulder joints, elbow joints, wrist joints, MCPs, PIPs, DIPs have good range of motion with no synovitis.  Complete fist formation bilaterally.  Mild prominence PIP and DIP joints consistent with early osteoarthritic changes.  Hip joints have good range of motion with no groin pain.  Some tenderness over bilateral trochanteric bursa.  Knee joints have good range of motion with no warmth or effusion.  Ankle joints have good range of motion with some tenderness over the right ankle.  CDAI Exam: CDAI Score: 0.4  Patient Global: 2 mm; Provider Global: 2 mm Swollen: 0 ; Tender: 0  Joint Exam 09/20/2021   No joint exam has been documented for this visit   There is currently no information documented on the homunculus. Go to the Rheumatology activity and complete the homunculus joint exam.  Investigation: No additional findings.  Imaging: No results found.  Recent Labs: Lab Results  Component Value Date   WBC 7.1 05/12/2021   HGB 13.3 05/12/2021   PLT 311 05/12/2021   NA 140 05/12/2021   K 4.3 05/12/2021   CL 104 05/12/2021   CO2 28 05/12/2021   GLUCOSE 65 05/12/2021   BUN 17 05/12/2021   CREATININE 0.91 05/12/2021   BILITOT 0.6 05/12/2021   ALKPHOS 54 07/27/2020   AST 38 (H) 05/12/2021   ALT 31 (H) 05/12/2021   PROT 6.9 05/12/2021   ALBUMIN 4.0 07/27/2020   CALCIUM 9.3 05/12/2021   GFRAA 94 02/08/2021   QFTBGOLDPLUS NEGATIVE 10/20/2020    Speciality Comments: PLQ Eye Exam: 05/12/2021 WNL [facility] Follow up in 1 year.   Inadequate response to methotrexate, Enbrel, Humira, Orencia-hives everywhere  Procedures:  No procedures performed Allergies: Orencia [abatacept], Shellfish allergy, Strawberry (diagnostic), Gluten meal, Lortab [hydrocodone-acetaminophen], Morphine and related, and Augmentin [amoxicillin-pot  clavulanate]   Assessment /  Plan:     Visit Diagnoses: Rheumatoid arthritis of multiple sites with negative rheumatoid factor ([place]) - Diagnosed at [place] [facility]. The ultrasound examination performed showed synovitis in bilateral hands: She has no synovitis on examination today.  She has been experiencing some increased discomfort in both hands due to underlying osteoarthritis.  PIP and DIP prominence consistent with osteoarthritic changes noted on examination.  She was able to make a complete fist bilaterally.  She has clinically been doing well taking Rinvoq 15 mg 1 tablet by mouth daily and Plaquenil 200 mg 1 tablet by mouth twice daily.  She continues to tolerate both medications without any side effects.  She will remain on the current treatment regimen.  Patient requested to have updated ESR and CRP drawn today.  She was advised to notify us if she develops increased joint pain or joint swelling.  She will follow-up in the office in 5 months.- Plan: Sedimentation rate, C-reactive protein  High risk medication use - Rinvoq 15 mg 1 tablet by mouth daily and Plaquenil 200 mg 1 tablet by mouth twice daily. PLQ Eye Exam: 05/12/2021.  CBC and CMP were drawn on 05/12/2021.  She is overdue to update lab work.  Orders for CBC and CMP were released.  Her next lab work will be due in April and every 3 months to monitor for drug toxicity.  TB Gold negative on 10/20/2020.  Future order for TB gold placed today.  Lipid panel drawn on 10/20/2020.  Offered to update lipid panel today while the patient was in the office but she was not fasting.  She will be having an updated lipid panel with her cardiologist in February 2023 and will have the results forwarded to our office.  She remains on Crestor as prescribed.  Plan: COMPLETE METABOLIC PANEL WITH GFR, CBC with Differential/Platelet, QuantiFERON-TB Gold Plus, Lipid panel Association of heart disease with rheumatoid arthritis was discussed. Need to  monitor blood pressure, cholesterol, and to exercise 30-60 minutes on daily basis was discussed.  In November the patient was in [place] for 3 weeks and she developed an E. coli infection.  She held Rinvoq for 1 week during that time while being treated with antibiotics. Discussed the importance of always holding Rinvoq if she develops signs or symptoms of an infection and to resume once the infection has completely cleared.  Screening for tuberculosis - Future order for TB gold placed today. Plan: QuantiFERON-TB Gold Plus  Primary osteoarthritis of both feet: She continues to experience intermittent discomfort in the right ankle joint.  On examination today no synovitis was noted.  Discussed the importance of continuing to wear proper fitting shoes daily.  Trochanteric bursitis of both hips: She has tenderness to palpation over bilateral trochanteric bursa.  Discussed the importance of performing stretching exercises on a daily basis.  I also discussed possibly practicing yoga to improve her flexibility and bursitis of both hips  Chronic bilateral low back pain without sciatica: She continues to have persistent lower back pain.  No symptoms of sciatica at this time.  Discussed the importance of core strengthening and lower back exercises.  I also discussed the importance of weight loss.  Dry scalp: Improved.   Lateral epicondylitis, right elbow - Resolved   Postmenopausal: DEXA ordered today. Patient is postmenopausal and has a history of oral prednisone use.  According to the patient she had a rib fracture recently we did not show up on imaging but she was told by her  sports med doctor that it was in fact fractured.  DEXA will be ordered today for further evaluation.  Other medical conditions are listed as follows:   Weight gain: She has noticed increased weight gain since starting on gabapentin and rinvoq around the same time.  Discussed that weight gain is a common side effect of  gabapentin.  She has not noticed any clinical benefits since starting on gabapentin so she plans on discussing discontinuing gabapentin with her PCP.   History of gastroesophageal reflux (GERD)  Hx of migraines  History of hypothyroidism  Decreased cardiac ejection fraction  History of asthma  Hot flashes: No improvement taking gabapentin.  She plans on discussing discontinuing gabapentin with her PCP due to ongoing hot flashes and weight gain.     Orders: Orders Placed This Encounter  Procedures   DG Bone Density   COMPLETE METABOLIC PANEL WITH GFR   CBC with Differential/Platelet   QuantiFERON-TB Gold Plus   Lipid panel   Sedimentation rate   C-reactive protein   No orders of the defined types were placed in this encounter.    Follow-Up Instructions: Return in about 5 months (around 02/18/2022) for Rheumatoid arthritis, Osteoarthritis.   Ofilia Neas, PA-C  Note - This record has been created using Dragon software.  Chart creation errors have been sought, but may not always  have been located. Such creation errors do not reflect on  the standard of medical care.

## 2021-09-20 ENCOUNTER — Other Ambulatory Visit: Payer: Self-pay

## 2021-09-20 ENCOUNTER — Ambulatory Visit (INDEPENDENT_AMBULATORY_CARE_PROVIDER_SITE_OTHER): Payer: 59 | Admitting: Physician Assistant

## 2021-09-20 ENCOUNTER — Encounter: Payer: Self-pay | Admitting: Physician Assistant

## 2021-09-20 VITALS — BP 125/78 | HR 96 | Ht 67.0 in | Wt 208.2 lb

## 2021-09-20 DIAGNOSIS — R635 Abnormal weight gain: Secondary | ICD-10-CM

## 2021-09-20 DIAGNOSIS — R232 Flushing: Secondary | ICD-10-CM

## 2021-09-20 DIAGNOSIS — M0609 Rheumatoid arthritis without rheumatoid factor, multiple sites: Secondary | ICD-10-CM

## 2021-09-20 DIAGNOSIS — Z79899 Other long term (current) drug therapy: Secondary | ICD-10-CM | POA: Diagnosis not present

## 2021-09-20 DIAGNOSIS — Z111 Encounter for screening for respiratory tuberculosis: Secondary | ICD-10-CM

## 2021-09-20 DIAGNOSIS — M7061 Trochanteric bursitis, right hip: Secondary | ICD-10-CM

## 2021-09-20 DIAGNOSIS — Z8669 Personal history of other diseases of the nervous system and sense organs: Secondary | ICD-10-CM

## 2021-09-20 DIAGNOSIS — M19071 Primary osteoarthritis, right ankle and foot: Secondary | ICD-10-CM | POA: Diagnosis not present

## 2021-09-20 DIAGNOSIS — M7711 Lateral epicondylitis, right elbow: Secondary | ICD-10-CM

## 2021-09-20 DIAGNOSIS — M19072 Primary osteoarthritis, left ankle and foot: Secondary | ICD-10-CM

## 2021-09-20 DIAGNOSIS — Z780 Asymptomatic menopausal state: Secondary | ICD-10-CM

## 2021-09-20 DIAGNOSIS — M7062 Trochanteric bursitis, left hip: Secondary | ICD-10-CM

## 2021-09-20 DIAGNOSIS — G8929 Other chronic pain: Secondary | ICD-10-CM

## 2021-09-20 DIAGNOSIS — R931 Abnormal findings on diagnostic imaging of heart and coronary circulation: Secondary | ICD-10-CM

## 2021-09-20 DIAGNOSIS — M545 Low back pain, unspecified: Secondary | ICD-10-CM

## 2021-09-20 DIAGNOSIS — Z8709 Personal history of other diseases of the respiratory system: Secondary | ICD-10-CM

## 2021-09-20 DIAGNOSIS — Z8719 Personal history of other diseases of the digestive system: Secondary | ICD-10-CM

## 2021-09-20 DIAGNOSIS — R238 Other skin changes: Secondary | ICD-10-CM

## 2021-09-20 DIAGNOSIS — Z8639 Personal history of other endocrine, nutritional and metabolic disease: Secondary | ICD-10-CM

## 2021-09-20 NOTE — Patient Instructions (Signed)
Standing Labs °We placed an order today for your standing lab work.  ° °Please have your standing labs drawn in April and every 3 months  ° °If possible, please have your labs drawn 2 weeks prior to your appointment so that the provider can discuss your results at your appointment. ° °Please note that you may see your imaging and lab results in MyChart before we have reviewed them. °We may be awaiting multiple results to interpret others before contacting you. °Please allow our office up to 72 hours to thoroughly review all of the results before contacting the office for clarification of your results. ° °We have open lab daily: °Monday through Thursday from 1:30-4:30 PM and Friday from 1:30-4:00 PM °at the office of Dr. Shaili Deveshwar, [facility].   °Please be advised, all patients with office appointments requiring lab work will take precedent over walk-in lab work.  °If possible, please come for your lab work on Monday and Friday afternoons, as you may experience shorter wait times. °The office is located at [address], [address], [place], [place] [address] °No appointment is necessary.   °Labs are drawn by [facility]. Please bring your co-pay at the time of your lab draw.  You may receive a bill from [facility] for your lab work. ° °If you wish to have your labs drawn at another location, please call the office 24 hours in advance to send orders. ° °If you have any questions regarding directions or hours of operation,  °please call [phone].   °As a reminder, please drink plenty of water prior to coming for your lab work. Thanks! ° °

## 2021-09-21 ENCOUNTER — Other Ambulatory Visit: Payer: Self-pay | Admitting: Physician Assistant

## 2021-09-21 LAB — CBC WITH DIFFERENTIAL/PLATELET
Absolute Monocytes: 409 cells/uL (ref 200–950)
Basophils Absolute: 43 cells/uL (ref 0–200)
Basophils Relative: 0.7 %
Eosinophils Absolute: 98 cells/uL (ref 15–500)
Eosinophils Relative: 1.6 %
HCT: 41.1 % (ref 35.0–45.0)
Hemoglobin: 13.4 g/dL (ref 11.7–15.5)
Lymphs Abs: 2214 cells/uL (ref 850–3900)
MCH: 28.9 pg (ref 27.0–33.0)
MCHC: 32.6 g/dL (ref 32.0–36.0)
MCV: 88.8 fL (ref 80.0–100.0)
MPV: 10.8 fL (ref 7.5–12.5)
Monocytes Relative: 6.7 %
Neutro Abs: 3337 cells/uL (ref 1500–7800)
Neutrophils Relative %: 54.7 %
Platelets: 340 10*3/uL (ref 140–400)
RBC: 4.63 10*6/uL (ref 3.80–5.10)
RDW: 13.5 % (ref 11.0–15.0)
Total Lymphocyte: 36.3 %
WBC: 6.1 10*3/uL (ref 3.8–10.8)

## 2021-09-21 LAB — COMPLETE METABOLIC PANEL WITH GFR
AG Ratio: 1.7 (calc) (ref 1.0–2.5)
ALT: 26 U/L (ref 6–29)
AST: 35 U/L (ref 10–35)
Albumin: 4.7 g/dL (ref 3.6–5.1)
Alkaline phosphatase (APISO): 58 U/L (ref 37–153)
BUN: 11 mg/dL (ref 7–25)
CO2: 29 mmol/L (ref 20–32)
Calcium: 10.1 mg/dL (ref 8.6–10.4)
Chloride: 104 mmol/L (ref 98–110)
Creat: 0.96 mg/dL (ref 0.50–1.03)
Globulin: 2.7 g/dL (calc) (ref 1.9–3.7)
Glucose, Bld: 64 mg/dL — ABNORMAL LOW (ref 65–99)
Potassium: 4.5 mmol/L (ref 3.5–5.3)
Sodium: 141 mmol/L (ref 135–146)
Total Bilirubin: 0.7 mg/dL (ref 0.2–1.2)
Total Protein: 7.4 g/dL (ref 6.1–8.1)
eGFR: 71 mL/min/{1.73_m2} (ref >=60)

## 2021-09-21 LAB — C-REACTIVE PROTEIN: CRP: 2.6 mg/L (ref <8.0)

## 2021-09-21 LAB — SEDIMENTATION RATE: Sed Rate: 11 mm/h (ref 0–30)

## 2021-09-21 NOTE — Progress Notes (Signed)
Glucose is 64.  Rest of CMP WNL.  CBC WNL.  ESR WNL.

## 2021-09-22 ENCOUNTER — Encounter: Payer: Self-pay | Admitting: Physician Assistant

## 2021-09-22 ENCOUNTER — Other Ambulatory Visit: Payer: Self-pay

## 2021-09-22 ENCOUNTER — Ambulatory Visit (INDEPENDENT_AMBULATORY_CARE_PROVIDER_SITE_OTHER): Payer: 59 | Admitting: Physician Assistant

## 2021-09-22 VITALS — BP 128/85 | HR 87 | Temp 98.0°F | Ht 67.0 in | Wt 205.2 lb

## 2021-09-22 DIAGNOSIS — E162 Hypoglycemia, unspecified: Secondary | ICD-10-CM

## 2021-09-22 DIAGNOSIS — E890 Postprocedural hypothyroidism: Secondary | ICD-10-CM

## 2021-09-22 LAB — BASIC METABOLIC PANEL
BUN: 14 mg/dL (ref 6–23)
CO2: 26 mEq/L (ref 19–32)
Calcium: 9.9 mg/dL (ref 8.4–10.5)
Chloride: 104 mEq/L (ref 96–112)
Creatinine, Ser: 0.98 mg/dL (ref 0.40–1.20)
GFR: 65.94 mL/min (ref >60.00)
Glucose, Bld: 92 mg/dL (ref 70–99)
Potassium: 4.7 mEq/L (ref 3.5–5.1)
Sodium: 138 mEq/L (ref 135–145)

## 2021-09-22 LAB — TSH: TSH: 8.69 u[IU]/mL — ABNORMAL HIGH (ref 0.35–5.50)

## 2021-09-22 LAB — GLUCOSE, POCT (MANUAL RESULT ENTRY): POC Glucose: 94 mg/dl (ref 70–99)

## 2021-09-22 LAB — POCT GLYCOSYLATED HEMOGLOBIN (HGB A1C): Hemoglobin A1C: 5.2 % (ref 4.0–5.6)

## 2021-09-22 LAB — T4, FREE: Free T4: 1 ng/dL (ref 0.60–1.60)

## 2021-09-22 NOTE — Patient Instructions (Signed)
Please go to lab and I will call with results. If any abnormalities, next step would be to meet with endocrinologist. Thanks for coming in today! See you back in the summer for regular med check / follow up.

## 2021-09-22 NOTE — Progress Notes (Signed)
Subjective:    Patient ID: [name], female    DOB: 10-11-1965, 56 y.o.   MRN: [id number]  Chief Complaint  Patient presents with   Hypoglycemia    Hypoglycemia  Patient with hx seronegative RA and OA is in today for recent hypoglycemia lab result on 09/20/21 with reading of 64 mg/dL. Prior reading was 65 mg/dL on 05/12/21. She was not fasting for either of those tests. Pt is not diabetic as far as she is aware. She feels tired often and has been struggling with weight gain. Some headaches & still having hot flashes. Denies any significant episodes of dizziness, shakiness, fainting.  Father is type 2 diabetic. No family history of hypoglycemia.   Pt is fasting this morning. Last ate around 7 pm and had taco salad with ground chicken & a few tortilla chips.  Past Medical History:  Diagnosis Date   Arthritis    ra zero negative   Asthma    mild/moderate persistent asthma   Complication of anesthesia    slow to awaken 25 yrs ago   Elevated liver function tests dr Neil Crouch pcp manages   for last year   Family history of breast cancer    Fibroid    Fractured rib    Frequent headaches    GERD (gastroesophageal reflux disease)    Gluten intolerance    H/O seasonal allergies    History of chicken pox    History of COVID-19 07/2019   high fever sob, x 7 days all symptoms resolved   Left bundle branch block 02/2019   Dr.Paula Ross   Migraines    PONV (postoperative nausea and vomiting)    ponv with several surgeries, likes scopolamine patch   Thyroid disease    Multiple benign nodules--thyroid removed    Past Surgical History:  Procedure Laterality Date   ANTERIOR AND POSTERIOR REPAIR N/A 06/14/2020   Procedure: ANTERIOR (CYSTOCELE) AND POSTERIOR REPAIR (RECTOCELE);  Surgeon: Nunzio Cobbs, MD;  Location: [facility];  Service: Gynecology;  Laterality: N/A;   BLADDER SUSPENSION N/A 06/14/2020   Procedure: TRANSVAGINAL TAPE (TVT)  PROCEDURE/EXACT MIDURETHRAL SLING;  Surgeon: Nunzio Cobbs, MD;  Location: [facility];  Service: Gynecology;  Laterality: N/A;  EXACT MIDURETHRAL SLING   CARDIAC CATHETERIZATION  2009   results normal done in [place]   CHOLECYSTECTOMY  1995   laparoscopic   CYSTOSCOPY N/A 06/14/2020   Procedure: CYSTOSCOPY;  Surgeon: Nunzio Cobbs, MD;  Location: [facility];  Service: Gynecology;  Laterality: N/A;   HAMMER TOE SURGERY Bilateral 2009   HERNIA REPAIR  2015   Has abdominal wall mesh - [place], [place].  Laparoscopic incisional hernia repari with 7 x 9 inch Ventralight ST with Echo mesh   LAPAROSCOPIC HYSTERECTOMY  2007   Supracervical hysterectomy with cystosctopy [place], [place] partial   THYROID SURGERY  2013   total thyroidectomy due to 11 nodules    Family History  Problem Relation Age of Onset   Arthritis Mother    Cancer Mother 100       breast ca--dec age 3   Miscarriages / [place] Mother    Breast cancer Mother 29   Diabetes Father    Heart attack Father    Heart disease Father        pacemaker    Hyperlipidemia Father    Hypertension Father    Stroke Father  x4   COPD Sister    Peripheral Artery Disease Sister    Asthma Brother    Birth defects Brother    Cancer Brother        skin   Hyperlipidemia Brother    Heart attack Maternal Grandmother    Heart disease Maternal Grandmother    Hyperlipidemia Maternal Grandmother    Stroke Maternal Grandmother    Alcohol abuse Paternal Grandmother    Cancer Paternal Grandmother        colon   Cancer Paternal Grandfather        breast   Heart attack Paternal Grandfather    Breast cancer Paternal Grandfather 47   Alcohol abuse Brother    Early death Brother        suicide   Mental illness Brother    Asthma Daughter    Asthma Son    Asthma Son    Breast cancer Maternal Aunt 60   Kidney cancer Paternal Aunt 4   Breast cancer Paternal Aunt 82   Throat  cancer Paternal Uncle    Thyroid cancer Paternal Uncle    Breast cancer Cousin 24   Prostate cancer Cousin     Social History   Tobacco Use   Smoking status: Never   Smokeless tobacco: Never  Vaping Use   Vaping Use: Never used  Substance Use Topics   Alcohol use: Yes    Comment: 2 glasses of wine/month   Drug use: Never     Allergies  Allergen Reactions   Orencia [Abatacept] Hives   Shellfish Allergy Anaphylaxis and Hives   Strawberry (Diagnostic) Anaphylaxis    Hazelnuts hives asthma, okra hives asthma Strawberry throat closes   Gluten Meal     Bloody diarrhea and hives   Lortab [Hydrocodone-Acetaminophen]     Swelling of tongue   Morphine And Related Itching    irritable   Augmentin [Amoxicillin-Pot Clavulanate] Rash    Review of Systems NEGATIVE UNLESS OTHERWISE INDICATED IN HPI      Objective:     BP 128/85    Pulse 87    Temp 98 F (36.7 C)    Ht 5\' 7"  (1.702 m)    Wt 205 lb 3.2 oz (93.1 kg)    LMP  (LMP Unknown)    SpO2 99%    BMI 32.14 kg/m   Wt Readings from Last 3 Encounters:  09/22/21 205 lb 3.2 oz (93.1 kg)  09/20/21 208 lb 3.2 oz (94.4 kg)  08/24/21 203 lb (92.1 kg)    BP Readings from Last 3 Encounters:  09/22/21 128/85  09/20/21 125/78  08/24/21 130/80     Physical Exam Vitals and nursing note reviewed.  Constitutional:      General: She is not in acute distress.    Appearance: Normal appearance. She is obese.  HENT:     Head: Normocephalic.     Right Ear: External ear normal.     Left Ear: External ear normal.     Nose: Nose normal.     Mouth/Throat:     Mouth: Mucous membranes are moist.  Eyes:     Extraocular Movements: Extraocular movements intact.     Conjunctiva/sclera: Conjunctivae normal.     Pupils: Pupils are equal, round, and reactive to light.  Neck:     Thyroid: No thyroid mass, thyromegaly or thyroid tenderness.  Cardiovascular:     Rate and Rhythm: Normal rate and regular rhythm.     Pulses: Normal pulses.  Heart sounds: No murmur heard. Pulmonary:     Effort: Pulmonary effort is normal.     Breath sounds: Normal breath sounds.  Abdominal:     Tenderness: There is no abdominal tenderness.  Musculoskeletal:        General: Normal range of motion.     Cervical back: Normal range of motion.  Skin:    General: Skin is warm.  Neurological:     General: No focal deficit present.     Mental Status: She is alert and oriented to person, place, and time.     Gait: Gait normal.  Psychiatric:        Mood and Affect: Mood normal.        Behavior: Behavior normal.       Assessment & Plan:   Problem List Items Addressed This Visit   None Visit Diagnoses     Hypoglycemia    -  Primary   Relevant Orders   POCT HgB A1C (Completed)   POCT glucose (manual entry)   Insulin and C-Peptide   Basic Metabolic Panel (BMET)   Proinsulin   Post-operative hypothyroidism       Relevant Orders   TSH   T4, free       1. Hypoglycemia I personally reviewed patient's most recent lab results.  Retested point-of-care glucose at her appointment and this level was 94 fasting.  Her hemoglobin A1c point-of-care was 5.2.  Reassured patient that this shows she is not diabetic.  With her 2 prior readings of just fairly hypoglycemic, discussed with patient that we could check a C-peptide, insulin, and proinsulin level today just to rule out any elevations that could be possibly associated with worst-case scenario an insulinoma.  She is agreeable with looking to rule this out.  I will follow-up with patient after these lab results are back.  2. Post-operative hypothyroidism Recheck TSH and free T4 to make sure that she is still on the correct dose of levothyroxine.  She is currently taking 125 mcg daily.   This note was prepared with assistance of Systems analyst. Occasional wrong-word or sound-a-like substitutions may have occurred due to the inherent limitations of voice recognition  software.  Time Spent: 39 minutes of total time was spent on the date of the encounter performing the following actions: chart review prior to seeing the patient, obtaining history, performing a medically necessary exam, counseling on the treatment plan, placing orders, and documenting in our EHR.    Scotty Weigelt M Cloe Sockwell, PA-C

## 2021-09-22 NOTE — Progress Notes (Signed)
CRP WNL

## 2021-09-23 ENCOUNTER — Encounter: Payer: Self-pay | Admitting: Physician Assistant

## 2021-09-23 ENCOUNTER — Telehealth: Payer: Self-pay | Admitting: Physician Assistant

## 2021-09-23 ENCOUNTER — Other Ambulatory Visit: Payer: Self-pay | Admitting: Physician Assistant

## 2021-09-23 LAB — INSULIN AND C-PEPTIDE, SERUM
C-Peptide: 2.4 ng/mL (ref 1.1–4.4)
INSULIN: 11.1 u[IU]/mL (ref 2.6–24.9)

## 2021-09-23 MED ORDER — LEVOTHYROXINE SODIUM 150 MCG PO TABS: 150.0000 ug | ORAL_TABLET | Freq: Every day | ORAL | 1 refills | Status: DC

## 2021-09-23 NOTE — Telephone Encounter (Signed)
Returned call to patient. Gave lab results and recommendations. Patient informed that higher dose was sent to the pharmacy. Patient verbalized understanding.

## 2021-09-23 NOTE — Telephone Encounter (Signed)
Patient notified and verbalized understanding. Patient scheduled for 6 week recheck.

## 2021-09-23 NOTE — Telephone Encounter (Signed)
Pt would like a call back asap from [place] or her assistant. Pt states TSH is high, and medication will need to be changed.   [facility].

## 2021-09-26 ENCOUNTER — Telehealth: Payer: Self-pay | Admitting: *Deleted

## 2021-09-26 NOTE — Telephone Encounter (Signed)
Received DEXA results from [facility].  Date of Scan: 09/22/2021  Lowest T-score:-0.3  BMD:0.817  Lowest site measured:Right Femoral Neck  Significant changes in BMD and site measured (5% and above):n/a  Current Regimen:Vitamin D  Recommendation:WNL Repeat in 5 years  Reviewed by:Hazel Sams, PA-C  Next Appointment:  Due June 2023.   Left message to advise patient of results.

## 2021-09-28 ENCOUNTER — Other Ambulatory Visit: Payer: Self-pay | Admitting: Rheumatology

## 2021-09-28 DIAGNOSIS — M0609 Rheumatoid arthritis without rheumatoid factor, multiple sites: Secondary | ICD-10-CM

## 2021-09-28 LAB — PROINSULIN: Proinsulin: 4 pmol/L (ref <=18.8)

## 2021-09-28 NOTE — Telephone Encounter (Signed)
Please schedule patient for a follow up visit. Patient due June 2023. Thanks! 

## 2021-09-28 NOTE — Telephone Encounter (Signed)
Next Visit: Due June 2023. Message sent to the front to schedule patient.   Last Visit: 09/20/2021  Last Fill: 07/06/2021  OJ:[id number] arthritis of multiple sites with negative rheumatoid factor   Current Dose per office note 09/20/2021: Rinvoq 15 mg 1 tablet by mouth daily   Labs: 09/20/2021 glucose is 64.  Rest of CMP WNL.  CBC WNL.  ESR WNL.   TB Gold: 10/20/2020 Neg   Okay to refill Rinvoq?

## 2021-09-28 NOTE — Telephone Encounter (Signed)
LMOM for patient to call and schedule 5 month follow-up appointment. 

## 2021-10-17 ENCOUNTER — Other Ambulatory Visit: Payer: Self-pay | Admitting: Physician Assistant

## 2021-10-19 ENCOUNTER — Other Ambulatory Visit: Payer: Self-pay | Admitting: Family Medicine

## 2021-10-20 ENCOUNTER — Emergency Department (HOSPITAL_BASED_OUTPATIENT_CLINIC_OR_DEPARTMENT_OTHER)
Admission: EM | Admit: 2021-10-20 | Discharge: 2021-10-21 | Disposition: A | Discharge status: Home / Self Care | Payer: 59 | Attending: Emergency Medicine | Admitting: Emergency Medicine

## 2021-10-20 ENCOUNTER — Encounter: Payer: Self-pay | Admitting: Internal Medicine

## 2021-10-20 ENCOUNTER — Encounter (HOSPITAL_BASED_OUTPATIENT_CLINIC_OR_DEPARTMENT_OTHER): Payer: Self-pay

## 2021-10-20 ENCOUNTER — Other Ambulatory Visit: Payer: Self-pay

## 2021-10-20 ENCOUNTER — Emergency Department (HOSPITAL_BASED_OUTPATIENT_CLINIC_OR_DEPARTMENT_OTHER): Payer: 59 | Admitting: Radiology

## 2021-10-20 ENCOUNTER — Ambulatory Visit: Payer: 59 | Admitting: Internal Medicine

## 2021-10-20 DIAGNOSIS — Z20822 Contact with and (suspected) exposure to covid-19: Secondary | ICD-10-CM | POA: Diagnosis not present

## 2021-10-20 DIAGNOSIS — J4540 Moderate persistent asthma, uncomplicated: Secondary | ICD-10-CM | POA: Insufficient documentation

## 2021-10-20 DIAGNOSIS — N390 Urinary tract infection, site not specified: Secondary | ICD-10-CM

## 2021-10-20 DIAGNOSIS — Z79899 Other long term (current) drug therapy: Secondary | ICD-10-CM | POA: Diagnosis not present

## 2021-10-20 DIAGNOSIS — R059 Cough, unspecified: Secondary | ICD-10-CM | POA: Insufficient documentation

## 2021-10-20 DIAGNOSIS — E039 Hypothyroidism, unspecified: Secondary | ICD-10-CM | POA: Insufficient documentation

## 2021-10-20 DIAGNOSIS — Z8616 Personal history of COVID-19: Secondary | ICD-10-CM | POA: Diagnosis not present

## 2021-10-20 DIAGNOSIS — R000 Tachycardia, unspecified: Secondary | ICD-10-CM | POA: Diagnosis not present

## 2021-10-20 DIAGNOSIS — R0789 Other chest pain: Secondary | ICD-10-CM | POA: Diagnosis not present

## 2021-10-20 DIAGNOSIS — J189 Pneumonia, unspecified organism: Secondary | ICD-10-CM

## 2021-10-20 DIAGNOSIS — R509 Fever, unspecified: Secondary | ICD-10-CM | POA: Insufficient documentation

## 2021-10-20 DIAGNOSIS — Z7951 Long term (current) use of inhaled steroids: Secondary | ICD-10-CM | POA: Insufficient documentation

## 2021-10-20 LAB — URINALYSIS, ROUTINE W REFLEX MICROSCOPIC
Bilirubin Urine: NEGATIVE
Glucose, UA: NEGATIVE mg/dL
Hgb urine dipstick: NEGATIVE
Ketones, ur: NEGATIVE mg/dL
Nitrite: NEGATIVE
Protein, ur: NEGATIVE mg/dL
Specific Gravity, Urine: 1.005 (ref 1.005–1.030)
pH: 7 (ref 5.0–8.0)

## 2021-10-20 LAB — CBC WITH DIFFERENTIAL/PLATELET
Abs Immature Granulocytes: 0.02 10*3/uL (ref 0.00–0.07)
Basophils Absolute: 0.1 10*3/uL (ref 0.0–0.1)
Basophils Relative: 1 %
Eosinophils Absolute: 0 10*3/uL (ref 0.0–0.5)
Eosinophils Relative: 1 %
HCT: 40.6 % (ref 36.0–46.0)
Hemoglobin: 13.6 g/dL (ref 12.0–15.0)
Immature Granulocytes: 0 %
Lymphocytes Relative: 10 %
Lymphs Abs: 0.7 10*3/uL (ref 0.7–4.0)
MCH: 28.3 pg (ref 26.0–34.0)
MCHC: 33.5 g/dL (ref 30.0–36.0)
MCV: 84.6 fL (ref 80.0–100.0)
Monocytes Absolute: 0.6 10*3/uL (ref 0.1–1.0)
Monocytes Relative: 8 %
Neutro Abs: 5.7 10*3/uL (ref 1.7–7.7)
Neutrophils Relative %: 80 %
Platelets: 350 10*3/uL (ref 150–400)
RBC: 4.8 MIL/uL (ref 3.87–5.11)
RDW: 13.6 % (ref 11.5–15.5)
WBC: 7.1 10*3/uL (ref 4.0–10.5)
nRBC: 0 % (ref 0.0–0.2)

## 2021-10-20 LAB — COMPREHENSIVE METABOLIC PANEL
ALT: 28 U/L (ref 0–44)
AST: 37 U/L (ref 15–41)
Albumin: 4.7 g/dL (ref 3.5–5.0)
Alkaline Phosphatase: 52 U/L (ref 38–126)
Anion gap: 11 (ref 5–15)
BUN: 13 mg/dL (ref 6–20)
CO2: 24 mmol/L (ref 22–32)
Calcium: 9.9 mg/dL (ref 8.9–10.3)
Chloride: 101 mmol/L (ref 98–111)
Creatinine, Ser: 1.01 mg/dL — ABNORMAL HIGH (ref 0.44–1.00)
GFR, Estimated: >60 mL/min (ref >60)
Glucose, Bld: 108 mg/dL — ABNORMAL HIGH (ref 70–99)
Potassium: 3.8 mmol/L (ref 3.5–5.1)
Sodium: 136 mmol/L (ref 135–145)
Total Bilirubin: 0.7 mg/dL (ref 0.3–1.2)
Total Protein: 8.1 g/dL (ref 6.5–8.1)

## 2021-10-20 LAB — PROTIME-INR
INR: 0.9 (ref 0.8–1.2)
Prothrombin Time: 12.5 seconds (ref 11.4–15.2)

## 2021-10-20 LAB — LACTIC ACID, PLASMA
Lactic Acid, Venous: 1.2 mmol/L (ref 0.5–1.9)
Lactic Acid, Venous: 1 mmol/L (ref 0.5–1.9)

## 2021-10-20 LAB — RESP PANEL BY RT-PCR (FLU A&B, COVID) ARPGX2
Influenza A by PCR: NEGATIVE
Influenza B by PCR: NEGATIVE
SARS Coronavirus 2 by RT PCR: NEGATIVE

## 2021-10-20 LAB — APTT: aPTT: 35 seconds (ref 24–36)

## 2021-10-20 LAB — TROPONIN I (HIGH SENSITIVITY)
Troponin I (High Sensitivity): 22 ng/L — ABNORMAL HIGH (ref <18)
Troponin I (High Sensitivity): 28 ng/L — ABNORMAL HIGH (ref <18)

## 2021-10-20 MED ORDER — IBUPROFEN 400 MG PO TABS
600.0000 mg | ORAL_TABLET | Freq: Once | ORAL | Status: AC
  Administered 2021-10-20: 600 mg via ORAL
  Filled 2021-10-20: qty 1

## 2021-10-20 MED ORDER — SODIUM CHLORIDE 0.9 % IV BOLUS
1000.0000 mL | Freq: Once | INTRAVENOUS | Status: AC
  Administered 2021-10-20: 1000 mL via INTRAVENOUS

## 2021-10-20 NOTE — ED Triage Notes (Signed)
Patient here POV from Home.  Patient began to have UTI Symptoms for approximately 1 week and Patient was prescribed Antibiotics for Same.  Patient began to notice Dry Cough with Wheezing today. Fever began at 1200 today. No Congestion. No Sore Throat.  Highest Temp: 101.8. Tylenol at 2000.   NAD Noted during Triage. A&Ox4. GCS 15. Ambulatory. History of Asthma.

## 2021-10-21 ENCOUNTER — Emergency Department (HOSPITAL_BASED_OUTPATIENT_CLINIC_OR_DEPARTMENT_OTHER): Payer: 59

## 2021-10-21 MED ORDER — BENZONATATE 100 MG PO CAPS
100.0000 mg | ORAL_CAPSULE | Freq: Once | ORAL | Status: AC
  Administered 2021-10-21: 100 mg via ORAL
  Filled 2021-10-21: qty 1

## 2021-10-21 MED ORDER — ONDANSETRON 4 MG PO TBDP: 4.0000 mg | ORAL_TABLET | Freq: Three times a day (TID) | ORAL | 0 refills | Status: AC | PRN

## 2021-10-21 MED ORDER — IOHEXOL 350 MG/ML SOLN
75.0000 mL | Freq: Once | INTRAVENOUS | Status: AC | PRN
  Administered 2021-10-21: 75 mL via INTRAVENOUS

## 2021-10-21 MED ORDER — LEVOFLOXACIN 750 MG PO TABS
750.0000 mg | ORAL_TABLET | Freq: Every day | ORAL | 0 refills | Status: AC
Start: 2021-10-21 — End: 2021-10-26

## 2021-10-21 MED ORDER — GUAIFENESIN 100 MG/5ML PO LIQD
5.0000 mL | Freq: Once | ORAL | Status: AC
  Administered 2021-10-21: 5 mL via ORAL
  Filled 2021-10-21: qty 10

## 2021-10-21 MED ORDER — LEVOFLOXACIN 750 MG PO TABS
750.0000 mg | ORAL_TABLET | Freq: Once | ORAL | Status: AC
Start: 2021-10-21 — End: 2021-10-21
  Administered 2021-10-21: 750 mg via ORAL
  Filled 2021-10-21: qty 1

## 2021-10-21 NOTE — ED Provider Notes (Signed)
[facility] EMERGENCY DEPT Provider Note  CSN: [id number] Arrival date & time: 10/20/21 2040  Chief Complaint(s) Fever  HPI [name] is a 56 y.o. female with h/o RA currently being treated for UTI with macrobid here after she developed fever and cough today. No emesis. Feels SOB and chest tightness. Has been using nebs at home. No abd pain.   Fever  Past Medical History Past Medical History:  Diagnosis Date   Arthritis    ra zero negative   Asthma    mild/moderate persistent asthma   Complication of anesthesia    slow to awaken 25 yrs ago   Elevated liver function tests dr Neil Crouch pcp manages   for last year   Family history of breast cancer    Fibroid    Fractured rib    Frequent headaches    GERD (gastroesophageal reflux disease)    Gluten intolerance    H/O seasonal allergies    History of chicken pox    History of COVID-19 07/2019   high fever sob, x 7 days all symptoms resolved   Left bundle branch block 02/2019   Dr.Paula Ross   Migraines    PONV (postoperative nausea and vomiting)    ponv with several surgeries, likes scopolamine patch   Thyroid disease    Multiple benign nodules--thyroid removed   Patient Active Problem List   Diagnosis Date Noted   Status post surgery 06/14/2020   Genetic testing 02/18/2020   Family history of breast cancer    High risk medication use 10/30/2019   Left bundle branch block 04/28/2019   Right wrist pain 03/13/2019   Hamstring strain, left, initial encounter 02/20/2019   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 Authorizing Provider  acetaminophen  (TYLENOL) 500 MG tablet Take 500 mg by mouth every 6 (six) hours as needed.    [provider]  ADVAIR DISKUS 250-50 MCG/DOSE AEPB INHALE 1 PUFF BY MOUTH TWICE A DAY 06/22/20   Orma Flaming, MD  albuterol (VENTOLIN HFA) 108 (90 Base) MCG/ACT inhaler TAKE 2 PUFFS BY MOUTH EVERY 6 HOURS AS NEEDED FOR WHEEZE OR SHORTNESS OF BREATH 10/20/21   Allwardt, Randa Evens, PA-C  cholecalciferol (VITAMIN D) 400 units TABS tablet Take 5,000 Units by mouth.     [provider]  cyclobenzaprine (FLEXERIL) 10 MG tablet Daily at bed time as needed 07/27/21   Glennon Mac, DO  fexofenadine-pseudoephedrine (ALLEGRA-D 24) 180-240 MG 24 hr tablet Take 1 tablet by mouth daily. 06/23/21   Allwardt, Randa Evens, PA-C  fluticasone-salmeterol (ADVAIR HFA) 115-21 MCG/ACT inhaler Inhale 2 puffs into the lungs 2 (two) times daily. 05/04/21   Allwardt, Randa Evens, PA-C  gabapentin (NEURONTIN) 800 MG tablet Take 1 tablet (800 mg total) by mouth at bedtime. Patient taking differently: Take 400 mg by mouth at bedtime. 06/23/21   Allwardt, Alyssa M, PA-C  hydroxychloroquine (PLAQUENIL) 200 MG tablet TAKE 1 TABLET BY MOUTH TWICE A DAY 09/07/21   Ofilia Neas, PA-C  montelukast (SINGULAIR) 10 MG tablet TAKE 1 TABLET BY MOUTH EVERYDAY AT BEDTIME 07/27/21   Allwardt, Alyssa M, PA-C  RINVOQ 15 MG TB24 TAKE 1 TABLET DAILY. 09/28/21   Quita Skye,  Geni Bers, PA-C  rosuvastatin (CRESTOR) 5 MG tablet Take 0.5 tablets (2.5 mg total) by mouth daily. Please keep upcoming appt in February 2023 with Dr. Harrington Challenger before anymore refills. Thank you 08/05/21   Fay Records, MD  SYNTHROID 125 MCG tablet TAKE 1 TABLET BY MOUTH EVERY DAY 09/21/21   Allwardt, Alyssa M, PA-C  SYNTHROID 150 MCG tablet TAKE 1 TABLET BY MOUTH EVERY DAY 10/18/21   Allwardt, Alyssa M, PA-C                                                                                                                                    Allergies Orencia [abatacept], Shellfish allergy, Strawberry  (diagnostic), Gluten meal, Lortab [hydrocodone-acetaminophen], Morphine and related, and Augmentin [amoxicillin-pot clavulanate]  Review of Systems Review of Systems  Constitutional:  Positive for fever.  As noted in HPI  Physical Exam Vital Signs  I have reviewed the triage vital signs BP 126/80    Pulse (!) 105    Temp 100.2 F (37.9 C) (Oral)    Resp 19    Ht 5\' 7"  (1.702 m)    Wt 93.1 kg    LMP  (LMP Unknown)    SpO2 97%    BMI 32.15 kg/m   Physical Exam  ED Results and Treatments Labs (all labs ordered are listed, but only abnormal results are displayed) Labs Reviewed  URINALYSIS, ROUTINE W REFLEX MICROSCOPIC - Abnormal; Notable for the following components:      Result Value   Leukocytes,Ua SMALL (*)    Bacteria, UA RARE (*)    All other components within normal limits  COMPREHENSIVE METABOLIC PANEL - Abnormal; Notable for the following components:   Glucose, Bld 108 (*)    Creatinine, Ser 1.01 (*)    All other components within normal limits  TROPONIN I (HIGH SENSITIVITY) - Abnormal; Notable for the following components:   Troponin I (High Sensitivity) 22 (*)    All other components within normal limits  TROPONIN I (HIGH SENSITIVITY) - Abnormal; Notable for the following components:   Troponin I (High Sensitivity) 28 (*)    All other components within normal limits  RESP PANEL BY RT-PCR (FLU A&B, COVID) ARPGX2  CULTURE, BLOOD (ROUTINE X 2)  CULTURE, BLOOD (ROUTINE X 2)  LACTIC ACID, PLASMA  LACTIC ACID, PLASMA  CBC WITH DIFFERENTIAL/PLATELET  PROTIME-INR  APTT  EKG  EKG Interpretation  Date/Time:  Thursday October 20 2021 21:09:09 EST Ventricular Rate:  135 PR Interval:  116 QRS Duration: 138 QT Interval:  304 QTC Calculation: 456 R Axis:   53 Text Interpretation: Sinus tachycardia Left bundle branch block Abnormal ECG When compared with  ECG of 16-Mar-2019 14:46, Vent. rate has increased BY  52 BPM Confirmed by Addison Lank [id number]) on 10/20/2021 11:04:43 PM       Radiology DG Chest 2 View  Result Date: 10/20/2021 CLINICAL DATA:  Shortness of breath EXAM: CHEST - 2 VIEW COMPARISON:  07/27/2021 FINDINGS: Faint right lower lobe opacity. Left lung is clear. No pleural effusion or pneumothorax. The heart is normal in size. Mild degenerative changes of the visualized thoracolumbar spine. IMPRESSION: Faint right lower lobe opacity, raising the possibility of mild pneumonia, although equivocal. Electronically Signed   By: Julian Hy M.D.   On: 10/20/2021 22:03   CT Angio Chest PE W and/or Wo Contrast  Result Date: 10/21/2021 CLINICAL DATA:  Chest discomfort EXAM: CT ANGIOGRAPHY CHEST WITH CONTRAST TECHNIQUE: Multidetector CT imaging of the chest was performed using the standard protocol during bolus administration of intravenous contrast. Multiplanar CT image reconstructions and MIPs were obtained to evaluate the vascular anatomy. RADIATION DOSE REDUCTION: This exam was performed according to the departmental dose-optimization program which includes automated exposure control, adjustment of the mA and/or kV according to patient size and/or use of iterative reconstruction technique. CONTRAST:  37mL OMNIPAQUE IOHEXOL 350 MG/ML SOLN COMPARISON:  Chest x-ray from the previous day. FINDINGS: Cardiovascular: Thoracic aorta and its branches appear within normal limits. No cardiac enlargement is noted. No pericardial effusion is seen. No significant coronary calcifications are noted. Pulmonary artery as visualized is within normal limits without evidence of pulmonary embolism. Mediastinum/Nodes: Thoracic inlet is within normal limits. No sizable hilar or mediastinal adenopathy is noted. The esophagus as visualized is within normal limits. Lungs/Pleura: Lungs are well aerated bilaterally. Patchy right middle lobe infiltrate is noted along the major  fissure which corresponds to that seen on recent chest x-ray. No sizable effusion is noted. No other infiltrate is seen. No pneumothorax is noted. Upper Abdomen: Gallbladder has been surgically removed. The remainder of the upper abdomen appears within normal limits. Musculoskeletal: Degenerative changes of the thoracic spine are noted. No acute rib abnormality is seen. Review of the MIP images confirms the above findings. IMPRESSION: Right middle lobe infiltrate consistent with acute pneumonia. No evidence of pulmonary emboli. Electronically Signed   By: Inez Catalina M.D.   On: 10/21/2021 00:51    Pertinent labs & imaging results that were available during my care of the patient were reviewed by me and considered in my medical decision making (see MDM for details).  Medications Ordered in ED Medications  sodium chloride 0.9 % bolus 1,000 mL (0 mLs Intravenous Stopped 10/21/21 0142)  ibuprofen (ADVIL) tablet 600 mg (600 mg Oral Given 10/20/21 2321)  guaiFENesin (ROBITUSSIN) 100 MG/5ML liquid 5 mL (5 mLs Oral Given 10/21/21 0047)  iohexol (OMNIPAQUE) 350 MG/ML injection 75 mL (75 mLs Intravenous Contrast Given 10/21/21 0027)  benzonatate (TESSALON) capsule 100 mg (100 mg Oral Given 10/21/21 0047)  levofloxacin (LEVAQUIN) tablet 750 mg (750 mg Oral Given 10/21/21 0204)  Procedures Procedures  (including critical care time)  Medical Decision Making / ED Course     Work-up ordered to assess concerns above. Labs and imaging Independently interpreted by me and noted below: EKG with sinus tachycardia and known left bundle branch block. Troponins drawn in triage slightly elevated Likely secondary to demand. CBC without leukocytosis or anemia Metabolic panel without significant electrolyte derangements or renal sufficiency UA suspicious for mild urinary tract infection Lactic  acid negative. COVID/influenza negative. Chest x-ray suspicious for right-sided pneumonia. CTA obtained to better characterize possible lung infection and to rule out PE given her history of autoimmune disorder and persistent tachycardia. CTA negative for PE but did confirm the right middle lobe pneumonia. Patient persistent tachycardia and pneumonia is likely the cause of patient's mild troponin leak. Have low suspicion for ACS.  Management: IV fluid bolus Cough medicine. Motrin. Oral antibiotics.  Reassessment: Tachycardia improved Tolerating p.o. intake.  Patient does not appear to be septic requiring admission or IV antibiotics at this time.  Since she is tolerating oral intake she is appropriate for outpatient management.  Treated with Levaquin to cover both pneumonia and urinary tract infection.   Final Clinical Impression(s) / ED Diagnoses Final diagnoses:  None   The patient appears reasonably screened and/or stabilized for discharge and I doubt any other medical condition or other [facility] requiring further screening, evaluation, or treatment in the ED at this time prior to discharge. Safe for discharge with strict return precautions.  Disposition: Discharge  Condition: Good  I have discussed the results, Dx and Tx plan with the patient/family who expressed understanding and agree(s) with the plan. Discharge instructions discussed at length. The patient/family was given strict return precautions who verbalized understanding of the instructions. No further questions at time of discharge.    ED Discharge Orders          Ordered    levofloxacin (LEVAQUIN) 750 MG tablet  Daily        10/21/21 0209    ondansetron (ZOFRAN-ODT) 4 MG disintegrating tablet  Every 8 hours PRN        10/21/21 0209            Follow Up: Allwardt, Randa Evens, PA-C [address] [place] [address] [phone]  Call  to schedule an appointment for close follow up            This chart was dictated using voice recognition software.  Despite best efforts to proofread,  errors can occur which can change the documentation meaning.    Fatima Blank, MD 10/21/21 [phone]

## 2021-10-21 NOTE — Telephone Encounter (Signed)
Contacted by patient's pharmacy to confirm levaquin medication which had been prescribed by an earlier EDP, as it has possibility to prolong qtc.  I reviewed her ECG from yesterday, Qtc 456, wnl.  I believe it's reasonably safe to complete a short course of this antibiotic.

## 2021-10-21 NOTE — ED Notes (Signed)
[facility] called d/t concern for possible interaction existing medications and newly prescribed medication from our ED. Dr. Oneta Rack spoke with pharmacy. See chart for more information

## 2021-10-26 ENCOUNTER — Other Ambulatory Visit: Payer: Self-pay

## 2021-10-26 ENCOUNTER — Encounter: Payer: Self-pay | Admitting: Physician Assistant

## 2021-10-26 ENCOUNTER — Ambulatory Visit (INDEPENDENT_AMBULATORY_CARE_PROVIDER_SITE_OTHER): Payer: 59 | Admitting: Physician Assistant

## 2021-10-26 VITALS — BP 109/76 | HR 94 | Temp 97.8°F | Ht 67.0 in | Wt 206.2 lb

## 2021-10-26 DIAGNOSIS — J189 Pneumonia, unspecified organism: Secondary | ICD-10-CM | POA: Diagnosis not present

## 2021-10-26 DIAGNOSIS — B3731 Acute candidiasis of vulva and vagina: Secondary | ICD-10-CM

## 2021-10-26 LAB — CULTURE, BLOOD (ROUTINE X 2)
Culture: NO GROWTH
Culture: NO GROWTH
Special Requests: ADEQUATE
Special Requests: ADEQUATE

## 2021-10-26 MED ORDER — AZITHROMYCIN 250 MG PO TABS: ORAL_TABLET | ORAL | 0 refills | Status: AC

## 2021-10-26 MED ORDER — ONDANSETRON 4 MG PO TBDP: 4.0000 mg | ORAL_TABLET | Freq: Three times a day (TID) | ORAL | 0 refills | Status: DC | PRN

## 2021-10-26 MED ORDER — FLUCONAZOLE 150 MG PO TABS: 150.0000 mg | ORAL_TABLET | Freq: Every day | ORAL | 0 refills | Status: DC

## 2021-10-26 NOTE — Progress Notes (Signed)
Subjective:    Patient ID: [name], female    DOB: July 16, 1966, 56 y.o.   MRN: [id number]  Chief Complaint  Patient presents with   Pneumonia    Follow up    Pneumonia  Patient is in today for ED f/up from 10/20/21 at [facility]. She was treated with Levaquin 750 mg x 6 doses for coverage of both RML pneumonia and UTI. Zofran prn nausea as well.   She has been taking Rinvoq for RA and feels like she has had recurrent illness because of this medication. She is going to talk with her rheum about other options.   Patient says that she still has productive cough, green sputum, and sinus pressure. She feels like her pneumonia is still not resolved. Starting to feel a vaginal yeast infection coming on as well.  Using rescue inhaler & Advair, which helps as well.  No fevers or chills. Some fatigue still. No ear pain, HA, or ST. No vomiting. She had nausea with the Levaquin.    Past Medical History:  Diagnosis Date   Arthritis    ra zero negative   Asthma    mild/moderate persistent asthma   Complication of anesthesia    slow to awaken 25 yrs ago   Elevated liver function tests dr Neil Crouch pcp manages   for last year   Family history of breast cancer    Fibroid    Fractured rib    Frequent headaches    GERD (gastroesophageal reflux disease)    Gluten intolerance    H/O seasonal allergies    History of chicken pox    History of COVID-19 07/2019   high fever sob, x 7 days all symptoms resolved   Left bundle branch block 02/2019   Dr.Paula Ross   Migraines    PONV (postoperative nausea and vomiting)    ponv with several surgeries, likes scopolamine patch   Thyroid disease    Multiple benign nodules--thyroid removed    Past Surgical History:  Procedure Laterality Date   ANTERIOR AND POSTERIOR REPAIR N/A 06/14/2020   Procedure: ANTERIOR (CYSTOCELE) AND POSTERIOR REPAIR (RECTOCELE);  Surgeon: Nunzio Cobbs, MD;  Location: [facility];  Service: Gynecology;  Laterality: N/A;   BLADDER SUSPENSION N/A 06/14/2020   Procedure: TRANSVAGINAL TAPE (TVT) PROCEDURE/EXACT MIDURETHRAL SLING;  Surgeon: Nunzio Cobbs, MD;  Location: [facility];  Service: Gynecology;  Laterality: N/A;  EXACT MIDURETHRAL SLING   CARDIAC CATHETERIZATION  2009   results normal done in [place]   CHOLECYSTECTOMY  1995   laparoscopic   CYSTOSCOPY N/A 06/14/2020   Procedure: CYSTOSCOPY;  Surgeon: Nunzio Cobbs, MD;  Location: [facility];  Service: Gynecology;  Laterality: N/A;   HAMMER TOE SURGERY Bilateral 2009   HERNIA REPAIR  2015   Has abdominal wall mesh - [place], [place].  Laparoscopic incisional hernia repari with 7 x 9 inch Ventralight ST with Echo mesh   LAPAROSCOPIC HYSTERECTOMY  2007   Supracervical hysterectomy with cystosctopy [place] partial   THYROID SURGERY  2013   total thyroidectomy due to 11 nodules    Family History  Problem Relation Age of Onset   Arthritis Mother    Cancer Mother 47       breast ca--dec age 65   Miscarriages / [place] Mother    Breast cancer Mother 58   Diabetes Father    Heart attack Father  Heart disease Father        pacemaker    Hyperlipidemia Father    Hypertension Father    Stroke Father        x4   COPD Sister    Peripheral Artery Disease Sister    Asthma Brother    Birth defects Brother    Cancer Brother        skin   Hyperlipidemia Brother    Heart attack Maternal Grandmother    Heart disease Maternal Grandmother    Hyperlipidemia Maternal Grandmother    Stroke Maternal Grandmother    Alcohol abuse Paternal Grandmother    Cancer Paternal Grandmother        colon   Cancer Paternal Grandfather        breast   Heart attack Paternal Grandfather    Breast cancer Paternal Grandfather 66   Alcohol abuse Brother    Early death Brother        suicide   Mental illness Brother    Asthma Daughter    Asthma Son     Asthma Son    Breast cancer Maternal Aunt 60   Kidney cancer Paternal Aunt 68   Breast cancer Paternal Aunt 38   Throat cancer Paternal Uncle    Thyroid cancer Paternal Uncle    Breast cancer Cousin 10   Prostate cancer Cousin     Social History   Tobacco Use   Smoking status: Never   Smokeless tobacco: Never  Vaping Use   Vaping Use: Never used  Substance Use Topics   Alcohol use: Yes    Comment: 2 glasses of wine/month   Drug use: Never     Allergies  Allergen Reactions   Orencia [Abatacept] Hives   Shellfish Allergy Anaphylaxis and Hives   Strawberry (Diagnostic) Anaphylaxis    Hazelnuts hives asthma, okra hives asthma Strawberry throat closes   Gluten Meal     Bloody diarrhea and hives   Lortab [Hydrocodone-Acetaminophen]     Swelling of tongue   Morphine And Related Itching    irritable   Augmentin [Amoxicillin-Pot Clavulanate] Rash    Review of Systems NEGATIVE UNLESS OTHERWISE INDICATED IN HPI      Objective:     BP 109/76    Pulse 94    Temp 97.8 F (36.6 C)    Ht 5\' 7"  (1.702 m)    Wt 206 lb 3.2 oz (93.5 kg)    LMP  (LMP Unknown)    SpO2 100%    BMI 32.30 kg/m   Wt Readings from Last 3 Encounters:  10/26/21 206 lb 3.2 oz (93.5 kg)  10/20/21 205 lb 4 oz (93.1 kg)  09/22/21 205 lb 3.2 oz (93.1 kg)    BP Readings from Last 3 Encounters:  10/26/21 109/76  10/21/21 110/85  09/22/21 128/85     Physical Exam Vitals and nursing note reviewed.  Constitutional:      Appearance: Normal appearance. She is normal weight. She is not toxic-appearing.  HENT:     Head: Normocephalic and atraumatic.     Right Ear: Tympanic membrane, ear canal and external ear normal.     Left Ear: Tympanic membrane, ear canal and external ear normal.     Nose: Nose normal.     Mouth/Throat:     Mouth: Mucous membranes are moist.  Eyes:     Extraocular Movements: Extraocular movements intact.     Conjunctiva/sclera: Conjunctivae normal.     Pupils: Pupils are  equal, round,  and reactive to light.  Cardiovascular:     Rate and Rhythm: Normal rate and regular rhythm.     Pulses: Normal pulses.     Heart sounds: Normal heart sounds.  Pulmonary:     Effort: Pulmonary effort is normal.     Breath sounds: Normal breath sounds.  Musculoskeletal:        General: Normal range of motion.     Cervical back: Normal range of motion and neck supple.  Skin:    General: Skin is warm and dry.  Neurological:     General: No focal deficit present.     Mental Status: She is alert and oriented to person, place, and time.  Psychiatric:        Mood and Affect: Mood normal.        Behavior: Behavior normal.        Thought Content: Thought content normal.        Judgment: Judgment normal.       Assessment & Plan:   Problem List Items Addressed This Visit   None Visit Diagnoses     Pneumonia of right middle lobe due to infectious organism    -  Primary   Relevant Medications   azithromycin (ZITHROMAX) 250 MG tablet   fluconazole (DIFLUCAN) 150 MG tablet   Other Relevant Orders   DG Chest 2 View   Vaginal yeast infection       Relevant Medications   azithromycin (ZITHROMAX) 250 MG tablet   fluconazole (DIFLUCAN) 150 MG tablet        Meds ordered this encounter  Medications   azithromycin (ZITHROMAX) 250 MG tablet    Sig: Take 2 tablets on day 1, then 1 tablet daily on days 2 through 5    Dispense:  6 tablet    Refill:  0   fluconazole (DIFLUCAN) 150 MG tablet    Sig: Take 1 tablet (150 mg total) by mouth daily. Repeat after 72 hours if still symptomatic.    Dispense:  3 tablet    Refill:  0   ondansetron (ZOFRAN-ODT) 4 MG disintegrating tablet    Sig: Take 1 tablet (4 mg total) by mouth every 8 (eight) hours as needed for nausea or vomiting.    Dispense:  20 tablet    Refill:  0   1. Pneumonia of right middle lobe due to infectious organism 2. Vaginal yeast infection -I personally reviewed her labs, images, and ED report from 10/20/21.   -She has completed 6 days of Levaquin, but still feels symptoms of pneumonia. As she is immunocompromised because of her RA, will start her on Z-pak at this time. She will take Zofran as needed for nausea. -Plan to repeat CXR in 2 weeks. -She will cont rescue inhaler and Advair. -Consider f/up with pulmonology if worse or no improvement.  -Diflucan as directed for vaginal yeast symptoms.   Time Spent: 38 minutes of total time was spent on the date of the encounter performing the following actions: chart review prior to seeing the patient, obtaining history, performing a medically necessary exam, counseling on the treatment plan, placing orders, and documenting in our EHR.    Khalfani Weideman M Deion Swift, PA-C

## 2021-10-26 NOTE — Patient Instructions (Addendum)
Please repeat CXR in 2 weeks at [facility] - see handout.  Take the Z-pak as directed Zofran as needed for nausea Use your rescue inhaler every 4-6 hours  Diflucan as directed

## 2021-11-04 ENCOUNTER — Other Ambulatory Visit (INDEPENDENT_AMBULATORY_CARE_PROVIDER_SITE_OTHER): Payer: 59

## 2021-11-04 ENCOUNTER — Other Ambulatory Visit: Payer: Self-pay

## 2021-11-04 ENCOUNTER — Ambulatory Visit (INDEPENDENT_AMBULATORY_CARE_PROVIDER_SITE_OTHER)
Admission: RE | Admit: 2021-11-04 | Discharge: 2021-11-04 | Disposition: A | Discharge status: Home / Self Care | Payer: 59 | Source: Ambulatory Visit | Attending: Physician Assistant | Admitting: Physician Assistant

## 2021-11-04 DIAGNOSIS — E039 Hypothyroidism, unspecified: Secondary | ICD-10-CM

## 2021-11-04 DIAGNOSIS — J189 Pneumonia, unspecified organism: Secondary | ICD-10-CM | POA: Diagnosis not present

## 2021-11-04 LAB — TSH: TSH: 0.45 u[IU]/mL (ref 0.35–5.50)

## 2021-11-18 ENCOUNTER — Encounter: Payer: Self-pay | Admitting: Physician Assistant

## 2021-11-24 ENCOUNTER — Telehealth: Payer: Self-pay | Admitting: Pharmacist

## 2021-11-24 ENCOUNTER — Other Ambulatory Visit: Payer: Self-pay | Admitting: Internal Medicine

## 2021-11-24 NOTE — Telephone Encounter (Signed)
Submitted a Prior Authorization RENEWAL request to [employer] for [facility] via CoverMyMeds. Will update once we receive a response. ? ?Per automated response, prior authorization remains in place through 09/07/22. ? ?Key: [id number] ? ?Knox Saliva, PharmD, MPH, BCPS ?Clinical Pharmacist (Rheumatology and Pulmonology) ?

## 2021-12-11 ENCOUNTER — Other Ambulatory Visit: Payer: Self-pay | Admitting: Physician Assistant

## 2021-12-11 DIAGNOSIS — M1230 Palindromic rheumatism, unspecified site: Secondary | ICD-10-CM

## 2021-12-12 NOTE — Telephone Encounter (Signed)
Next Visit: Due June 2023. Message sent to the front to schedule patient.  ?  ?Last Visit: 09/20/2021 ?  ?Last Fill: 07/06/2021 ?  ?[id number] arthritis of multiple sites with negative rheumatoid factor  ?  ?Current Dose per office note 09/20/2021:  Plaquenil 200 mg 1 tablet by mouth twice daily ?  ?Labs: 09/20/2021 glucose is 64.  Rest of CMP WNL.  CBC WNL.  ESR WNL.  ?  ?PLQ Eye Exam: 05/12/2021 WNL ?  ?Okay to refill PLQ?  ?

## 2021-12-12 NOTE — Telephone Encounter (Signed)
Please schedule patient for a follow up visit. Patient due June 2023. Thanks! 

## 2021-12-15 NOTE — Progress Notes (Signed)
? ?Office Visit Note ? ?Patient: [name]             ?Date of Birth: 1966-08-20           ?MRN: [id number]             ?PCP: Allwardt, Randa Evens, PA-C ?Referring: Allwardt, Randa Evens, PA-C ?Visit Date: 12/22/2021 ?Occupation: '@GUAROCC'$ @ ? ?Subjective:  ?Pain in multiple joints ? ?History of Present Illness: [name] is a 56 y.o. female with history of seropositive rheumatoid arthritis.  She has been on intermittent use of Rinvoq since February 2021.  She had to interrupt therapy several times due to infections.  She states since she has started Rinvoq she had infections almost every 2 months.  She states about 4 weeks ago she developed severe pneumonia.  She states the pneumonia eventually resolved and she finished antibiotics about 3 weeks ago.  She decided not to take Rinvoq anymore.  She has been having pain and discomfort in her bilateral hands.  She is having difficulty gripping objects.  She also experiences discomfort in her lower back.   ? ?Activities of Daily Living:  ?Patient reports morning stiffness for all day. ?Patient Reports nocturnal pain.  ?Difficulty dressing/grooming: Reports ?Difficulty climbing stairs: Reports ?Difficulty getting out of chair: Denies ?Difficulty using hands for taps, buttons, cutlery, and/or writing: Reports ? ?Review of Systems  ?Constitutional:  Negative for fatigue.  ?HENT:  Positive for mouth dryness. Negative for mouth sores and nose dryness.   ?Eyes:  Positive for dryness. Negative for pain and itching.  ?Respiratory:  Positive for shortness of breath. Negative for difficulty breathing.   ?     Related to asthma  ?Cardiovascular:  Negative for chest pain and palpitations.  ?Gastrointestinal:  Negative for blood in stool, constipation and diarrhea.  ?Endocrine: Negative for increased urination.  ?Genitourinary:  Negative for difficulty urinating.  ?Musculoskeletal:  Positive for joint pain, joint pain, joint swelling and morning stiffness. Negative for myalgias,  muscle tenderness and myalgias.  ?Skin:  Positive for redness. Negative for color change and rash.  ?Allergic/Immunologic: Positive for susceptible to infections.  ?Neurological:  Positive for numbness and parasthesias. Negative for dizziness, headaches, memory loss and weakness.  ?Hematological:  Positive for bruising/bleeding tendency.  ?Psychiatric/Behavioral:  Negative for confusion.   ? ?PMFS History:  ?Patient Active Problem List  ? Diagnosis Date Noted  ? Rheumatoid arthritis of multiple sites with negative rheumatoid factor ([place]) 12/22/2021  ? Status post surgery 06/14/2020  ? Genetic testing 02/18/2020  ? Family history of breast cancer   ? High risk medication use 10/30/2019  ? Left bundle branch block 04/28/2019  ? Right wrist pain 03/13/2019  ? Hamstring strain, left, initial encounter 02/20/2019  ? 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 Primary osteoarthritis of both feet 10/09/2018  ? Hx of migraines 09/27/2018  ? History of gastroesophageal reflux (GERD) 09/27/2018  ? 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 Moderate persistent asthma 07/05/2018  ? Hypothyroid 06/28/2018  ?  ?Past Medical History:  ?Diagnosis Date  ? Arthritis   ? ra zero negative  ? Asthma   ? mild/moderate persistent asthma  ? Complication of anesthesia   ? slow to awaken 25 yrs ago  ? Elevated liver function tests dr Neil Crouch pcp manages  ? for last year  ? Family history of breast cancer   ? Fibroid   ? Fractured rib   ? Frequent headaches   ? GERD (gastroesophageal reflux disease)   ?  Gluten intolerance   ? H/O seasonal allergies   ? History of chicken pox   ? History of COVID-19 07/2019  ? high fever sob, x 7 days all symptoms resolved  ? Left bundle branch block 02/2019  ? Dr.Paula Ross  ? Migraines   ? Pneumonia   ? PONV (postoperative nausea and vomiting)   ? ponv with several surgeries, likes scopolamine patch  ? Thyroid disease   ? Multiple benign nodules--thyroid removed  ?  ?Family History  ?Problem  Relation Age of Onset  ? Arthritis Mother   ? Cancer Mother 67  ?     breast ca--dec age 27  ? Miscarriages / [place] Mother   ? Breast cancer Mother 16  ? Diabetes Father   ? Heart attack Father   ? Heart disease Father   ?     pacemaker   ? Hyperlipidemia Father   ? Hypertension Father   ? Stroke Father   ?     x4  ? COPD Sister   ? Peripheral Artery Disease Sister   ? Asthma Brother   ? Birth defects Brother   ? Cancer Brother   ?     skin  ? Hyperlipidemia Brother   ? Heart attack Maternal Grandmother   ? Heart disease Maternal Grandmother   ? Hyperlipidemia Maternal Grandmother   ? Stroke Maternal Grandmother   ? Alcohol abuse Paternal Grandmother   ? Cancer Paternal Grandmother   ?     colon  ? Cancer Paternal Grandfather   ?     breast  ? Heart attack Paternal Grandfather   ? Breast cancer Paternal Grandfather 37  ? Alcohol abuse Brother   ? Early death Brother   ?     suicide  ? Mental illness Brother   ? Asthma Daughter   ? Asthma Son   ? Asthma Son   ? Breast cancer Maternal Aunt 60  ? Kidney cancer Paternal Aunt 85  ? Breast cancer Paternal Aunt 46  ? Throat cancer Paternal Uncle   ? Thyroid cancer Paternal Uncle   ? Breast cancer Cousin 14  ? Prostate cancer Cousin   ? ?Past Surgical History:  ?Procedure Laterality Date  ? ANTERIOR AND POSTERIOR REPAIR N/A 06/14/2020  ? Procedure: ANTERIOR (CYSTOCELE) AND POSTERIOR REPAIR (RECTOCELE);  Surgeon: Nunzio Cobbs, MD;  Location: [facility];  Service: Gynecology;  Laterality: N/A;  ? BLADDER SUSPENSION N/A 06/14/2020  ? Procedure: TRANSVAGINAL TAPE (TVT) PROCEDURE/EXACT MIDURETHRAL SLING;  Surgeon: Nunzio Cobbs, MD;  [facility];  Service: Gynecology;  Laterality: N/A;  EXACT MIDURETHRAL SLING  ? CARDIAC CATHETERIZATION  2009  ? results normal done in [place]  ? CHOLECYSTECTOMY  1995  ? laparoscopic  ? CYSTOSCOPY N/A 06/14/2020  ? Procedure: CYSTOSCOPY;  Surgeon: Nunzio Cobbs, MD;   Location: [facility];  Service: Gynecology;  Laterality: N/A;  ? HAMMER TOE SURGERY Bilateral 2009  ? HERNIA REPAIR  2015  ? Has abdominal wall mesh - [place], [place].  Laparoscopic incisional hernia repari with 7 x 9 inch Ventralight ST with Echo mesh  ? LAPAROSCOPIC HYSTERECTOMY  2007  ? Supracervical hysterectomy with cystosctopy - [place], [place] partial  ? THYROID SURGERY  2013  ? total thyroidectomy due to 11 nodules  ? ?Social History  ? ?Social History Narrative  ? Right Handed  ? Lives in a two story home  ? ?Immunization  History  ?Administered Date(s) Administered  ? Influenza,inj,Quad PF,6+ Mos 06/28/2018, 06/18/2019  ? Influenza-Unspecified 01/25/2018, 08/05/2020, 05/25/2021  ? Moderna Sars-Covid-2 Vaccination 01/27/2021  ? PFIZER(Purple Top)SARS-COV-2 Vaccination 12/08/2019, 01/05/2020, 08/05/2020  ? Pneumococcal Polysaccharide-23 10/22/2018  ? Tdap 06/28/2018  ? Zoster Recombinat (Shingrix) 04/15/2019, 07/16/2019  ?  ? ?Objective: ?Vital Signs: BP 128/88 (BP Location: Left Arm, Patient Position: Sitting, Cuff Size: Normal)   Pulse 85   Ht '5\' 7"'$  (1.702 m)   Wt 206 lb 12.8 oz (93.8 kg)   LMP  (LMP Unknown)   BMI 32.39 kg/m?   ? ?Physical Exam ?Vitals and nursing note reviewed.  ?Constitutional:   ?   Appearance: She is well-developed.  ?HENT:  ?   Head: Normocephalic and atraumatic.  ?Eyes:  ?   Conjunctiva/sclera: Conjunctivae normal.  ?Cardiovascular:  ?   Rate and Rhythm: Normal rate and regular rhythm.  ?   Heart sounds: Normal heart sounds.  ?Pulmonary:  ?   Effort: Pulmonary effort is normal.  ?   Breath sounds: Normal breath sounds.  ?Abdominal:  ?   General: Bowel sounds are normal.  ?   Palpations: Abdomen is soft.  ?Musculoskeletal:  ?   Cervical back: Normal range of motion.  ?Lymphadenopathy:  ?   Cervical: No cervical adenopathy.  ?Skin: ?   General: Skin is warm and dry.  ?   Capillary Refill: Capillary refill takes less than 2 seconds.  ?Neurological:  ?   Mental Status:  She is alert and oriented to person, place, and time.  ?Psychiatric:     ?   Behavior: Behavior normal.  ?  ? ?Musculoskeletal Exam: C-spine thoracic and lumbar spine were in good range of motion.  She had disc

## 2021-12-19 ENCOUNTER — Other Ambulatory Visit: Payer: Self-pay | Admitting: Internal Medicine

## 2021-12-20 ENCOUNTER — Other Ambulatory Visit: Payer: Self-pay | Admitting: *Deleted

## 2021-12-20 DIAGNOSIS — Z111 Encounter for screening for respiratory tuberculosis: Secondary | ICD-10-CM

## 2021-12-20 DIAGNOSIS — Z79899 Other long term (current) drug therapy: Secondary | ICD-10-CM

## 2021-12-21 NOTE — Progress Notes (Signed)
Lipid panel WNL

## 2021-12-22 ENCOUNTER — Ambulatory Visit (INDEPENDENT_AMBULATORY_CARE_PROVIDER_SITE_OTHER): Payer: 59 | Admitting: Rheumatology

## 2021-12-22 ENCOUNTER — Encounter: Payer: Self-pay | Admitting: Rheumatology

## 2021-12-22 VITALS — BP 128/88 | HR 85 | Ht 67.0 in | Wt 206.8 lb

## 2021-12-22 DIAGNOSIS — R931 Abnormal findings on diagnostic imaging of heart and coronary circulation: Secondary | ICD-10-CM

## 2021-12-22 DIAGNOSIS — M79641 Pain in right hand: Secondary | ICD-10-CM | POA: Diagnosis not present

## 2021-12-22 DIAGNOSIS — M7711 Lateral epicondylitis, right elbow: Secondary | ICD-10-CM | POA: Diagnosis not present

## 2021-12-22 DIAGNOSIS — M0609 Rheumatoid arthritis without rheumatoid factor, multiple sites: Secondary | ICD-10-CM

## 2021-12-22 DIAGNOSIS — M545 Low back pain, unspecified: Secondary | ICD-10-CM

## 2021-12-22 DIAGNOSIS — Z8639 Personal history of other endocrine, nutritional and metabolic disease: Secondary | ICD-10-CM

## 2021-12-22 DIAGNOSIS — R7989 Other specified abnormal findings of blood chemistry: Secondary | ICD-10-CM

## 2021-12-22 DIAGNOSIS — M79642 Pain in left hand: Secondary | ICD-10-CM

## 2021-12-22 DIAGNOSIS — M7062 Trochanteric bursitis, left hip: Secondary | ICD-10-CM

## 2021-12-22 DIAGNOSIS — M19072 Primary osteoarthritis, left ankle and foot: Secondary | ICD-10-CM

## 2021-12-22 DIAGNOSIS — Z8709 Personal history of other diseases of the respiratory system: Secondary | ICD-10-CM

## 2021-12-22 DIAGNOSIS — Z8669 Personal history of other diseases of the nervous system and sense organs: Secondary | ICD-10-CM

## 2021-12-22 DIAGNOSIS — R238 Other skin changes: Secondary | ICD-10-CM

## 2021-12-22 DIAGNOSIS — Z79899 Other long term (current) drug therapy: Secondary | ICD-10-CM | POA: Diagnosis not present

## 2021-12-22 DIAGNOSIS — Z780 Asymptomatic menopausal state: Secondary | ICD-10-CM

## 2021-12-22 DIAGNOSIS — Z8719 Personal history of other diseases of the digestive system: Secondary | ICD-10-CM

## 2021-12-22 DIAGNOSIS — M19071 Primary osteoarthritis, right ankle and foot: Secondary | ICD-10-CM

## 2021-12-22 DIAGNOSIS — M7061 Trochanteric bursitis, right hip: Secondary | ICD-10-CM

## 2021-12-22 DIAGNOSIS — G8929 Other chronic pain: Secondary | ICD-10-CM

## 2021-12-22 DIAGNOSIS — T148XXA Other injury of unspecified body region, initial encounter: Secondary | ICD-10-CM

## 2021-12-22 DIAGNOSIS — R232 Flushing: Secondary | ICD-10-CM

## 2021-12-22 MED ORDER — LEFLUNOMIDE 10 MG PO TABS: 10.0000 mg | ORAL_TABLET | Freq: Every day | ORAL | 3 refills | Status: DC

## 2021-12-22 NOTE — Addendum Note (Signed)
Addended by: Cassandria Anger on: 12/22/2021 12:08 PM ? ? Modules accepted: Orders ? ?

## 2021-12-22 NOTE — Progress Notes (Signed)
Pharmacy Note ? ?Subjective: ?Patient presents today to the [facility] for follow up office visit.  Patient seen by the pharmacist for counseling on leflunomide [name]) for rheumatoid arthritis. ? ?Objective: ?CBC ?   ?Component Value Date/Time  ? WBC 5.1 12/20/2021 0926  ? RBC 4.59 12/20/2021 0926  ? HGB 12.9 12/20/2021 0926  ? HGB 12.8 07/27/2020 1038  ? HGB 12.2 07/05/2020 0936  ? HCT 38.9 12/20/2021 0926  ? HCT 35.3 07/05/2020 0936  ? PLT 294 12/20/2021 0926  ? PLT 298 07/27/2020 1038  ? PLT 275 07/05/2020 0936  ? MCV 84.7 12/20/2021 0926  ? MCV 88 07/05/2020 0936  ? MCH 28.1 12/20/2021 0926  ? MCHC 33.2 12/20/2021 0926  ? RDW 12.9 12/20/2021 0926  ? RDW 12.7 07/05/2020 0936  ? LYMPHSABS 1,372 12/20/2021 0926  ? LYMPHSABS 1.8 07/05/2020 0936  ? MONOABS 0.6 10/20/2021 2127  ? EOSABS 179 12/20/2021 0926  ? EOSABS 0.2 07/05/2020 0936  ? BASOSABS 71 12/20/2021 0926  ? BASOSABS 0.1 07/05/2020 0936  ? ? ?CMP  ?   ?Component Value Date/Time  ? NA 141 12/20/2021 0926  ? NA 137 07/25/2016 0000  ? K 4.6 12/20/2021 0926  ? CL 106 12/20/2021 0926  ? CO2 27 12/20/2021 0926  ? GLUCOSE 89 12/20/2021 0926  ? BUN 14 12/20/2021 0926  ? CREATININE 0.77 12/20/2021 0926  ? CALCIUM 9.9 12/20/2021 0926  ? PROT 6.8 12/20/2021 0926  ? PROT 6.5 07/28/2019 0847  ? ALBUMIN 4.7 10/20/2021 2127  ? ALBUMIN 4.5 07/28/2019 0847  ? AST 31 12/20/2021 0926  ? ALT 26 12/20/2021 0926  ? ALKPHOS 52 10/20/2021 2127  ? BILITOT 0.6 12/20/2021 0926  ? BILITOT 0.6 07/28/2019 0847  ? GFRNONAA >60 10/20/2021 2127  ? GFRNONAA 81 02/08/2021 1044  ? GFRAA 94 02/08/2021 1044  ? ? ?Baseline Immunosuppressant Therapy Labs ? ?  Latest Ref Rng & Units 10/20/2020  ? 10:47 AM  ?Quantiferon TB Gold  ?Quantiferon TB Gold Plus NEGATIVE NEGATIVE    ? ? ? ?  Latest Ref Rng & Units 10/23/2018  ? 12:16 PM  ?Hepatitis  ?Hep B Surface Ag NON-REACTI NON-REACTIVE    ?Hep B IgM NON-REACTI NON-REACTIVE    ?Hep C Ab NON-REACTI NON-REACTIVE    ?Hep A IgM NON-REACTI  NON-REACTIVE    ? ? ?Lab Results  ?Component Value Date  ? HIV NON-REACTIVE 10/23/2018  ? ? ? ?  Latest Ref Rng & Units 10/23/2018  ? 12:16 PM  ?Immunoglobulin Electrophoresis  ?IgA  47 - 310 mg/dL 195    ?IgG 600 - 1,640 mg/dL 890    ?IgM 50 - 300 mg/dL 115    ? ? ? ?  Latest Ref Rng & Units 12/20/2021  ?  9:26 AM  ?Serum Protein Electrophoresis  ?Total Protein 6.1 - 8.1 g/dL 6.8    ? ? ?Lab Results  ?Component Value Date  ? G6PDH 16.2 09/27/2018  ? ? ?No results found for: TPMT  ? ?Pregnancy status:  post-menopausal ? ?Assessment/Plan: ? ?Patient was counseled on the purpose, proper use, and adverse effects of leflunomide including risk of infection, nausea/diarrhea/weight loss, increase in blood pressure, rash, hair loss, tingling in the hands and feet, and signs and symptoms of interstitial lung disease.   Also counseled on Black Box warning of liver injury and importance of avoiding alcohol while on therapy. Discussed that there is the possibility of an increased risk of malignancy but it is not well understood  if this increased risk is due to the medication or the disease state.  Counseled patient to avoid live vaccines. Recommend annual influenza, Pneumovax 23, Prevnar 13, and Shingrix as indicated.  ? ?Discussed the importance of frequent monitoring of liver function and blood count.  Standing orders placed.  Discussed importance of birth control while on leflunomide due to risk of congenital abnormalities, and patient confirms post-menopausal.  Provided patient with educational materials on leflunomide and answered all questions.  Patient consented to [place] use, and consent will be uploaded into the media tab.   ? ?Patient dose will be leflunomide '10mg'$  daily.  Prescription pending lab results and/or insurance approval.  ?

## 2021-12-22 NOTE — Patient Instructions (Addendum)
Leflunomide tablets ?What is this medication? ?LEFLUNOMIDE (le FLOO na mide) is for rheumatoid arthritis. ?This medicine may be used for other purposes; ask your health care provider or pharmacist if you have questions. ?COMMON BRAND NAME(S): Arava ?What should I tell my care team before I take this medication? ?They need to know if you have any of these conditions: ?diabetes ?have a fever or infection ?high blood pressure ?immune system problems ?kidney disease ?liver disease ?low blood cell counts, like low white cell, platelet, or red cell counts ?lung or breathing disease, like asthma ?recently received or scheduled to receive a vaccine ?receiving treatment for cancer ?skin conditions or sensitivity ?tingling of the fingers or toes, or other nerve disorder ?tuberculosis ?an unusual or allergic reaction to leflunomide, teriflunomide, other medicines, food, dyes, or preservatives ?pregnant or trying to get pregnant ?breast-feeding ?How should I use this medication? ?Take this medicine by mouth with a full glass of water. Follow the directions on the prescription label. Take your medicine at regular intervals. Do not take your medicine more often than directed. Do not stop taking except on your doctor's advice. ?Talk to your pediatrician regarding the use of this medicine in children. Special care may be needed. ?Overdosage: If you think you have taken too much of this medicine contact a poison control center or emergency room at once. ?NOTE: This medicine is only for you. Do not share this medicine with others. ?What if I miss a dose? ?If you miss a dose, take it as soon as you can. If it is almost time for your next dose, take only that dose. Do not take double or extra doses. ?What may interact with this medication? ?Do not take this medicine with any of the following medications: ?teriflunomide ?This medicine may also interact with the following medications: ?alosetron ?birth control  pills ?caffeine ?cefaclor ?certain medicines for diabetes like nateglinide, repaglinide, rosiglitazone, pioglitazone ?certain medicines for high cholesterol like atorvastatin, pravastatin, rosuvastatin, simvastatin ?charcoal ?cholestyramine ?ciprofloxacin ?duloxetine ?furosemide ?ketoprofen ?live virus vaccines ?medicines that increase your risk for infection ?methotrexate ?mitoxantrone ?paclitaxel ?penicillin ?theophylline ?tizanidine ?warfarin ?This list may not describe all possible interactions. Give your health care provider a list of all the medicines, herbs, non-prescription drugs, or dietary supplements you use. Also tell them if you smoke, drink alcohol, or use illegal drugs. Some items may interact with your medicine. ?What should I watch for while using this medication? ?Visit your health care provider for regular checks on your progress. Tell your doctor or health care provider if your symptoms do not start to get better or if they get worse. You may need blood work done while you are taking this medicine. ?This medicine may cause serious skin reactions. They can happen weeks to months after starting the medicine. Contact your health care provider right away if you notice fevers or flu-like symptoms with a rash. The rash may be red or purple and then turn into blisters or peeling of the skin. Or, you might notice a red rash with swelling of the face, lips or lymph nodes in your neck or under your arms. ?This medicine may stay in your body for up to 2 years after your last dose. Tell your doctor about any unusual side effects or symptoms. A medicine can be given to help lower your blood levels of this medicine more quickly. ?Women must use effective birth control with this medicine. There is a potential for serious side effects to an unborn child. Do not become pregnant while  taking this medicine. Inform your doctor if you wish to become pregnant. This medicine remains in your blood after you stop taking  it. You must continue using effective birth control until the blood levels have been checked and they are low enough. A medicine can be given to help lower your blood levels of this medicine more quickly. Immediately talk to your doctor if you think you may be pregnant. You may need a pregnancy test. Talk to your health care provider or pharmacist for more information. ?You should not receive certain vaccines during your treatment and for a certain time after your treatment with this medication ends. Talk to your health care provider for more information. ?What side effects may I notice from receiving this medication? ?Side effects that you should report to your doctor or health care professional as soon as possible: ?allergic reactions like skin rash, itching or hives, swelling of the face, lips, or tongue ?breathing problems ?cough ?increased blood pressure ?low blood counts - this medicine may decrease the number of white blood cells and platelets. You may be at increased risk for infections and bleeding. ?pain, tingling, numbness in the hands or feet ?rash, fever, and swollen lymph nodes ?redness, blistering, peeing or loosening of the skin, including inside the mouth ?signs of decreased platelets or bleeding - bruising, pinpoint red spots on the skin, black, tarry stools, blood in urine ?signs of infection - fever or chills, cough, sore throat, pain or trouble passing urine ?signs and symptoms of liver injury like dark yellow or brown urine; general ill feeling or flu-like symptoms; light-colored stools; loss of appetite; nausea; right upper belly pain; unusually weak or tired; yellowing of the eyes or skin ?trouble passing urine or change in the amount of urine ?vomiting ?Side effects that usually do not require medical attention (report to your doctor or health care professional if they continue or are bothersome): ?diarrhea ?hair thinning or loss ?headache ?nausea ?tiredness ?This list may not describe all  possible side effects. Call your doctor for medical advice about side effects. You may report side effects to FDA at 1-800-FDA-1088. ?Where should I keep my medication? ?Keep out of the reach of children. ?Store at room temperature between 15 and 30 degrees C (59 and 86 degrees F). Protect from moisture and light. Throw away any unused medicine after the expiration date. ?NOTE: This sheet is a summary. It may not cover all possible information. If you have questions about this medicine, talk to your doctor, pharmacist, or health care provider. ?? 2022 Elsevier/Gold Standard (2018-12-13 00:00:00) ? ?Standing Labs ?We placed an order today for your standing lab work.  ? ?Please have your standing labs drawn in 2 weeks and then every 3 months ? ?If possible, please have your labs drawn 2 weeks prior to your appointment so that the provider can discuss your results at your appointment. ? ?Please note that you may see your imaging and lab results in [facility] before we have reviewed them. ?We may be awaiting multiple results to interpret others before contacting you. ?Please allow our office up to 72 hours to thoroughly review all of the results before contacting the office for clarification of your results. ? ?We have open lab daily: ?Monday through Thursday from 1:30-4:30 PM and Friday from 1:30-4:00 PM ?at the office of Dr. Bo Merino, [facility].   ?Please be advised, all patients with office appointments requiring lab work will take precedent over walk-in lab work.  ?If possible, please come for  your lab work on Monday and Friday afternoons, as you may experience shorter wait times. ?The office is located at [address] [place], [place], [place] [address] ?No appointment is necessary.   ?Labs are drawn by [facility]. Please bring your co-pay at the time of your lab draw.  You may receive a bill from [facility] for your lab work. ? ?Please note if you are on Hydroxychloroquine and and an order has been  placed for a Hydroxychloroquine level, you will need to have it drawn 4 hours or more after your last dose. ? ?If you wish to have your labs drawn at another location, please call the office 24 hours in advance to send orders. ?

## 2021-12-23 LAB — COMPLETE METABOLIC PANEL WITH GFR
AG Ratio: 1.7 (calc) (ref 1.0–2.5)
ALT: 26 U/L (ref 6–29)
AST: 31 U/L (ref 10–35)
Albumin: 4.3 g/dL (ref 3.6–5.1)
Alkaline phosphatase (APISO): 54 U/L (ref 37–153)
BUN: 14 mg/dL (ref 7–25)
CO2: 27 mmol/L (ref 20–32)
Calcium: 9.9 mg/dL (ref 8.6–10.4)
Chloride: 106 mmol/L (ref 98–110)
Creat: 0.77 mg/dL (ref 0.50–1.03)
Globulin: 2.5 g/dL (calc) (ref 1.9–3.7)
Glucose, Bld: 89 mg/dL (ref 65–99)
Potassium: 4.6 mmol/L (ref 3.5–5.3)
Sodium: 141 mmol/L (ref 135–146)
Total Bilirubin: 0.6 mg/dL (ref 0.2–1.2)
Total Protein: 6.8 g/dL (ref 6.1–8.1)
eGFR: 92 mL/min/{1.73_m2} (ref >=60)

## 2021-12-23 LAB — LIPID PANEL
Cholesterol: 151 mg/dL (ref <200)
HDL: 66 mg/dL (ref >=50)
LDL Cholesterol (Calc): 67 mg/dL (calc)
Non-HDL Cholesterol (Calc): 85 mg/dL (calc) (ref <130)
Total CHOL/HDL Ratio: 2.3 (calc) (ref <5.0)
Triglycerides: 96 mg/dL (ref <150)

## 2021-12-23 LAB — CBC WITH DIFFERENTIAL/PLATELET
Absolute Monocytes: 367 cells/uL (ref 200–950)
Basophils Absolute: 71 cells/uL (ref 0–200)
Basophils Relative: 1.4 %
Eosinophils Absolute: 179 cells/uL (ref 15–500)
Eosinophils Relative: 3.5 %
HCT: 38.9 % (ref 35.0–45.0)
Hemoglobin: 12.9 g/dL (ref 11.7–15.5)
Lymphs Abs: 1372 cells/uL (ref 850–3900)
MCH: 28.1 pg (ref 27.0–33.0)
MCHC: 33.2 g/dL (ref 32.0–36.0)
MCV: 84.7 fL (ref 80.0–100.0)
MPV: 11.2 fL (ref 7.5–12.5)
Monocytes Relative: 7.2 %
Neutro Abs: 3111 cells/uL (ref 1500–7800)
Neutrophils Relative %: 61 %
Platelets: 294 10*3/uL (ref 140–400)
RBC: 4.59 10*6/uL (ref 3.80–5.10)
RDW: 12.9 % (ref 11.0–15.0)
Total Lymphocyte: 26.9 %
WBC: 5.1 10*3/uL (ref 3.8–10.8)

## 2021-12-23 LAB — QUANTIFERON-TB GOLD PLUS
Mitogen-NIL: >10 IU/mL
NIL: 0.03 IU/mL
QuantiFERON-TB Gold Plus: NEGATIVE
TB1-NIL: 0.01 IU/mL
TB2-NIL: 0 IU/mL

## 2021-12-26 NOTE — Progress Notes (Signed)
TB gold negative

## 2021-12-27 ENCOUNTER — Encounter: Payer: Self-pay | Admitting: Rheumatology

## 2022-01-09 ENCOUNTER — Encounter: Payer: Self-pay | Admitting: Physician Assistant

## 2022-01-10 ENCOUNTER — Other Ambulatory Visit: Payer: Self-pay | Admitting: Physician Assistant

## 2022-01-10 MED ORDER — CYCLOBENZAPRINE HCL 10 MG PO TABS: ORAL_TABLET | ORAL | 0 refills | Status: AC

## 2022-01-10 NOTE — Telephone Encounter (Signed)
Please send medication below to pharmacy, as I cannot refill. ? ?Thanks!  ?

## 2022-01-10 NOTE — Telephone Encounter (Signed)
Please disregard last message. Medication has been sent.  ?

## 2022-01-14 ENCOUNTER — Other Ambulatory Visit: Payer: Self-pay | Admitting: Internal Medicine

## 2022-01-14 ENCOUNTER — Other Ambulatory Visit: Payer: Self-pay | Admitting: Physician Assistant

## 2022-01-16 ENCOUNTER — Other Ambulatory Visit: Payer: Self-pay

## 2022-01-17 ENCOUNTER — Other Ambulatory Visit: Payer: Self-pay

## 2022-01-17 MED ORDER — LEVOTHYROXINE SODIUM 150 MCG PO TABS: 150.0000 ug | ORAL_TABLET | Freq: Every day | ORAL | 0 refills | Status: DC

## 2022-01-17 NOTE — Telephone Encounter (Signed)
Discussed generic coverage with Alyssa. Can send in Levothyroxine 150 mcg to pharmacy.  ?

## 2022-01-25 ENCOUNTER — Other Ambulatory Visit: Payer: Self-pay | Admitting: *Deleted

## 2022-01-25 DIAGNOSIS — Z79899 Other long term (current) drug therapy: Secondary | ICD-10-CM

## 2022-01-25 NOTE — Progress Notes (Unsigned)
Office Visit Note  Patient: [name]             Date of Birth: [birth year]           MRN: [id number]             PCP: Fredirick Lathe, PA-C Referring: Allwardt, Randa Evens, PA-C Visit Date: 02/08/2022 Occupation: '@GUAROCC'$ @  Subjective:  Medication monitoring  History of Present Illness: Cass Edinger is a 56 y.o. female with history of seronegative rheumatoid arthritis and osteoarthritis.  She is taking arava 10 mg daily and plaquenil 200 mg 1 tablet by mouth twice daily.  She was started on [place] after her last office visit on 12/22/2021.  She has noticed some increased hair loss since initiating [place] but has not noticed any other side effects.  She has not had any infections since starting on Arava.  She has noticed some improvement in her joint pain and stiffness since initiating therapy.  She states that for the past 2-week she has been experiencing pain and swelling in her left foot.  She denies any injury or fall.  She has been unable to identify a trigger for her symptoms.  She continues to exercise on a daily basis.  She has ongoing generalized stiffness but has not noticed any other joints that have been swelling.  She has occasional pain in her hands but the sharp shooting pains have become less frequent.    Activities of Daily Living:  Patient reports joint stiffness all day  Patient Reports nocturnal pain.  Difficulty dressing/grooming: Denies Difficulty climbing stairs: Reports Difficulty getting out of chair: Denies Difficulty using hands for taps, buttons, cutlery, and/or writing: Reports  Review of Systems  Constitutional:  Positive for fatigue.  HENT:  Positive for mouth dryness. Negative for mouth sores and nose dryness.   Eyes:  Positive for dryness. Negative for pain and visual disturbance.  Respiratory:  Negative for cough, hemoptysis and difficulty breathing.   Cardiovascular:  Negative for chest pain, palpitations and hypertension.  Gastrointestinal:   Negative for blood in stool, constipation and diarrhea.  Endocrine: Positive for heat intolerance. Negative for increased urination.  Genitourinary:  Negative for difficulty urinating and painful urination.  Musculoskeletal:  Positive for joint pain, gait problem, joint pain, joint swelling, muscle weakness, morning stiffness and muscle tenderness. Negative for myalgias and myalgias.  Skin:  Positive for hair loss. Negative for color change, pallor, rash, nodules/bumps, skin tightness, ulcers and sensitivity to sunlight.  Allergic/Immunologic: Positive for susceptible to infections.  Neurological:  Positive for numbness. Negative for dizziness, headaches and weakness.  Hematological:  Positive for bruising/bleeding tendency. Negative for swollen glands.  Psychiatric/Behavioral:  Positive for sleep disturbance. Negative for depressed mood. The patient is not nervous/anxious.    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   Primary osteoarthritis of both feet 10/09/2018   Hx of migraines 09/27/2018   History of gastroesophageal reflux (GERD) 09/27/2018   Palindromic rheumatism, 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   Moderate persistent asthma 07/05/2018   Hypothyroid 06/28/2018    Past Medical History:  Diagnosis Date  Arthritis    ra zero negative   Asthma    mild/moderate persistent asthma   Complication of anesthesia    slow to awaken 25 yrs ago   Elevated liver function tests dr Neil Crouch pcp manages   for last year   Family history of breast cancer    Fibroid    Fractured rib     Frequent headaches    GERD (gastroesophageal reflux disease)    Gluten intolerance    H/O seasonal allergies    History of chicken pox    History of COVID-19 07/2019   high fever sob, x 7 days all symptoms resolved   Left bundle branch block 02/2019   Dr.Paula Ross   Migraines    Pneumonia    PONV (postoperative nausea and vomiting)    ponv with several surgeries, likes scopolamine patch   Thyroid disease    Multiple benign nodules--thyroid removed    Family History  Problem Relation Age of Onset   Arthritis Mother    Cancer Mother 74       breast ca--dec age 19   Miscarriages / [place] Mother    Breast cancer Mother 55   Glaucoma Father    Diabetes Father    Heart attack Father    Heart disease Father        pacemaker    Hyperlipidemia Father    Hypertension Father    Stroke Father        x4   COPD Sister    Peripheral Artery Disease Sister    Asthma Brother    Birth defects Brother    Cancer Brother        skin   Hyperlipidemia Brother    Alcohol abuse Brother    Early death Brother        suicide   Mental illness Brother    Heart attack Maternal Grandmother    Heart disease Maternal Grandmother    Hyperlipidemia Maternal Grandmother    Stroke Maternal Grandmother    Alcohol abuse Paternal Grandmother    Cancer Paternal Grandmother        colon   Cancer Paternal Grandfather        breast   Heart attack Paternal Grandfather    Breast cancer Paternal Grandfather 78   Asthma Daughter    Asthma Son    Asthma Son    Breast cancer Maternal Aunt 58   Kidney cancer Paternal Aunt 28   Breast cancer Paternal Aunt 64   Throat cancer Paternal Uncle    Thyroid cancer Paternal Uncle    Breast cancer Cousin 58   Prostate cancer Cousin    Past Surgical History:  Procedure Laterality Date   ANTERIOR AND POSTERIOR REPAIR N/A 06/14/2020   Procedure: ANTERIOR (CYSTOCELE) AND POSTERIOR REPAIR (RECTOCELE);  Surgeon: Nunzio Cobbs, MD;  Location:  [facility];  Service: Gynecology;  Laterality: N/A;   BLADDER SUSPENSION N/A 06/14/2020   Procedure: TRANSVAGINAL TAPE (TVT) PROCEDURE/EXACT MIDURETHRAL SLING;  Surgeon: Nunzio Cobbs, MD;  Location: [facility];  Service: Gynecology;  Laterality: N/A;  EXACT MIDURETHRAL SLING   CARDIAC CATHETERIZATION  2009   results normal done in [place]   CHOLECYSTECTOMY  1995   laparoscopic   CYSTOSCOPY N/A 06/14/2020   Procedure: CYSTOSCOPY;  Surgeon: Nunzio Cobbs, MD;  Location: [facility];  Service: Gynecology;  Laterality: N/A;   HAMMER TOE SURGERY Bilateral 2009   HERNIA  REPAIR  2015   Has abdominal wall mesh - [place], [place].  Laparoscopic incisional hernia repari with 7 x 9 inch Ventralight ST with Echo mesh   LAPAROSCOPIC HYSTERECTOMY  2007   Supracervical hysterectomy with cystosctopy [place], [place] partial   THYROID SURGERY  2013   total thyroidectomy due to 11 nodules   Social History   Social History Narrative   Right Handed   Lives in a two story home   Immunization History  Administered Date(s) Administered   Influenza,inj,Quad PF,6+ Mos 06/28/2018, 06/18/2019   Influenza-Unspecified 01/25/2018, 08/05/2020, 05/25/2021   Moderna Sars-Covid-2 Vaccination 01/27/2021   PFIZER(Purple Top)SARS-COV-2 Vaccination 12/08/2019, 01/05/2020, 08/05/2020   Pneumococcal Polysaccharide-23 10/22/2018   Tdap 06/28/2018   Zoster Recombinat (Shingrix) 04/15/2019, 07/16/2019     Objective: Vital Signs: BP 129/81 (BP Location: Left Arm, Patient Position: Sitting, Cuff Size: Small)   Pulse 90   Resp 12   Ht '5\' 7"'$  (1.702 m)   Wt 199 lb 9.6 oz (90.5 kg)   LMP  (LMP Unknown)   BMI 31.26 kg/m    Physical Exam Vitals and nursing note reviewed.  Constitutional:      Appearance: She is well-developed.  HENT:     Head: Normocephalic and atraumatic.  Eyes:     Conjunctiva/sclera: Conjunctivae normal.  Cardiovascular:      Rate and Rhythm: Normal rate and regular rhythm.     Heart sounds: Normal heart sounds.  Pulmonary:     Effort: Pulmonary effort is normal.     Breath sounds: Normal breath sounds.  Abdominal:     General: Bowel sounds are normal.     Palpations: Abdomen is soft.  Musculoskeletal:     Cervical back: Normal range of motion.  Skin:    General: Skin is warm and dry.     Capillary Refill: Capillary refill takes less than 2 seconds.  Neurological:     Mental Status: She is alert and oriented to person, place, and time.  Psychiatric:        Behavior: Behavior normal.     Musculoskeletal Exam: C-spine, thoracic spine, lumbar spine good range of motion.  Midline spinal tenderness in the lumbar region and over the right SI joint.  Shoulder joints, elbow joints, wrist joints, MCPs, PIPs, DIPs have good range of motion with no synovitis.  Complete fist formation bilaterally.  Hip joints have good range of motion with no groin pain.  Knee joints have good range of motion with no warmth or effusion.  Ankle joints have good range of motion with some tenderness over the lateral aspect of the left ankle joint.  She also has some tenderness and swelling at the base of the left fifth metatarsal.  No tenderness or synovitis over MTP joints.  CDAI Exam: CDAI Score: 0.4  Patient Global: 2 mm; Provider Global: 2 mm Swollen: 0 ; Tender: 1  Joint Exam 02/08/2022      Right  Left  Ankle      Tender     Investigation: No additional findings.  Imaging: No results found.  Recent Labs: Lab Results  Component Value Date   WBC 5.9 01/25/2022   HGB 13.4 01/25/2022   PLT 293 01/25/2022   NA 140 01/25/2022   K 4.4 01/25/2022   CL 106 01/25/2022   CO2 24 01/25/2022   GLUCOSE 84 01/25/2022   BUN 13 01/25/2022   CREATININE 0.86 01/25/2022   BILITOT 0.5 01/25/2022   ALKPHOS 52 10/20/2021   AST 34  01/25/2022   ALT 27 01/25/2022   PROT 6.6 01/25/2022   ALBUMIN 4.7 10/20/2021   CALCIUM 9.5 01/25/2022    GFRAA 94 02/08/2021   QFTBGOLDPLUS NEGATIVE 12/20/2021    Speciality Comments: PLQ Eye Exam: 05/12/2021 [facility] Follow up in 1 year.   Inadequate response to methotrexate, Enbrel, Humira, Orencia-hives everywhere Rinvoq-pneumonia  Procedures:  No procedures performed Allergies: Orencia [abatacept], Shellfish allergy, Strawberry (diagnostic), Gluten meal, Lortab [hydrocodone-acetaminophen], Morphine and related, and Augmentin [amoxicillin-pot clavulanate]    Assessment / Plan:     Visit Diagnoses: Rheumatoid arthritis of multiple sites with negative rheumatoid factor ([place]) - Diagnosed at [place] [facility].  Previous ultrasound examination performed showed synovitis in bilateral hands: She has no synovitis on examination today.  Her rheumatoid arthritis appears to currently be well controlled taking Arava 10 mg 1 tablet daily and Plaquenil 200 mg 1 tablet twice daily.  She is tolerating combination therapy.  She initiated low-dose Arava after her last office visit on 12/22/2021.  She has noticed some increased hair loss since initiating [place] but is okay with continuing therapy at this time.  She has not had any infections since initiating therapy.   She presents today with increased pain and swelling in the left foot which started 2 weeks ago.  X-rays were obtained today for further evaluation.  Most of her tenderness is at the base of the left fifth metatarsal and over the lateral aspect of the subtalar joint.  There was no tenderness or synovitis over the MTP joints.  Referral to [facility] will be placed today for further evaluation and management.  She will remain on combination therapy as prescribed with close monitoring.  She will follow-up in the office in 2 months to reassess her response to combination therapy.   High risk medication use - Arava 10 mg 1 tablet by mouth daily and Plaquenil 200 mg 1 tablet by mouth twice daily.  She has noticed some  increased hair loss on Arava but has not noticed any other side effects.  She is tolerating Plaquenil without any side effects. CBC and CMP WNL on 01/25/22. She will be due to update lab work in August and every 3 months.  Standing orders for CBC and CMP remain in place.   PLQ Eye Exam: 05/12/2021 [facility] Follow up in 1 year.  She has not had any infections since initiating [place].  Discussed the importance of holding arava if she develops signs or symptoms of an infection and to resume once the infection has completely cleared.  Inadequate response to methotrexate, Enbrel, Humira Intolerance to: Orencia-hives, Rinvoq-recurrent infections.   Lateral epicondylitis, right elbow: Resolved.   Pain in both hands: X-rays of both hands were unremarkable on 09/27/2018.  She continues to experience intermittent pain and stiffness in both hands.  No synovitis was noted on examination today.  She will remain on the current treatment regimen.  We will update x-rays of her hands and feet at her next follow up visit to assess for radiographic progression.   Trochanteric bursitis of both hips: She continues to experience intermittent pain and stiffness in her hips due to trochanteric bursitis.  She has some tenderness palpation over bilateral trochanteric bursa on examination today.  Discussed the importance of performing daily stretching exercises.  Pain in left foot - She presents today with increased pain and swelling in the left foot which started 2 weeks ago.  She has been unable to  identify a trigger.  She has not had any recent injury or fall.  She has tried using [name], Voltaren gel, and CBD oil with no improvement in her symptoms.  She has continued to exercise on a daily basis and wears proper fitting shoes. On examination she has tenderness on the lateral aspect of the subtalar joint as well as over the base of the left fifth metatarsal.  Some diffuse swelling was noted.  No ecchymosis or  erythema noted.  No tenderness or synovitis over MTP joints.  No warmth or swelling of the left ankle joint was noted.  X-rays of the left foot were obtained today for further evaluation.  A referral to [facility] will be placed today for further evaluation and management.  Plan: XR Foot Complete Left  Primary osteoarthritis of both feet: X-rays of both feet from 09/27/2018 were reviewed today and are consistent with osteoarthritis and postsurgical changes.  No erosive changes were noted at that time.  Chronic bilateral low back pain without sciatica: She continues to experience discomfort and stiffness in her lower back.  She has some midline spinal tenderness in the lumbar region.  Some tenderness over the right SI joint was also noted.  She has upcoming appointment scheduled with a spine specialist.  Other medical conditions are listed as follows:  Decreased cardiac ejection fraction  History of gastroesophageal reflux (GERD)  Hx of migraines  History of asthma  Dry scalp  Hot flashes  History of hypothyroidism  Postmenopausal - DEXA ordered September 22, 2021 showed T score -0.3.    Orders: Orders Placed This Encounter  Procedures   XR Foot Complete Left   No orders of the defined types were placed in this encounter.  Follow-Up Instructions: Return in about 2 months (around 04/10/2022) for Rheumatoid arthritis, Osteoarthritis.   Ofilia Neas, PA-C  Note - This record has been created using Dragon software.  Chart creation errors have been sought, but may not always  have been located. Such creation errors do not reflect on  the standard of medical care.

## 2022-01-26 LAB — COMPLETE METABOLIC PANEL WITH GFR
AG Ratio: 1.8 (calc) (ref 1.0–2.5)
ALT: 27 U/L (ref 6–29)
AST: 34 U/L (ref 10–35)
Albumin: 4.2 g/dL (ref 3.6–5.1)
Alkaline phosphatase (APISO): 65 U/L (ref 37–153)
BUN: 13 mg/dL (ref 7–25)
CO2: 24 mmol/L (ref 20–32)
Calcium: 9.5 mg/dL (ref 8.6–10.4)
Chloride: 106 mmol/L (ref 98–110)
Creat: 0.86 mg/dL (ref 0.50–1.03)
Globulin: 2.4 g/dL (calc) (ref 1.9–3.7)
Glucose, Bld: 84 mg/dL (ref 65–99)
Potassium: 4.4 mmol/L (ref 3.5–5.3)
Sodium: 140 mmol/L (ref 135–146)
Total Bilirubin: 0.5 mg/dL (ref 0.2–1.2)
Total Protein: 6.6 g/dL (ref 6.1–8.1)
eGFR: 81 mL/min/{1.73_m2} (ref >=60)

## 2022-01-26 LAB — CBC WITH DIFFERENTIAL/PLATELET
Absolute Monocytes: 443 cells/uL (ref 200–950)
Basophils Absolute: 59 cells/uL (ref 0–200)
Basophils Relative: 1 %
Eosinophils Absolute: 212 cells/uL (ref 15–500)
Eosinophils Relative: 3.6 %
HCT: 41.3 % (ref 35.0–45.0)
Hemoglobin: 13.4 g/dL (ref 11.7–15.5)
Lymphs Abs: 1599 cells/uL (ref 850–3900)
MCH: 27 pg (ref 27.0–33.0)
MCHC: 32.4 g/dL (ref 32.0–36.0)
MCV: 83.1 fL (ref 80.0–100.0)
MPV: 11.4 fL (ref 7.5–12.5)
Monocytes Relative: 7.5 %
Neutro Abs: 3587 cells/uL (ref 1500–7800)
Neutrophils Relative %: 60.8 %
Platelets: 293 10*3/uL (ref 140–400)
RBC: 4.97 10*6/uL (ref 3.80–5.10)
RDW: 13.2 % (ref 11.0–15.0)
Total Lymphocyte: 27.1 %
WBC: 5.9 10*3/uL (ref 3.8–10.8)

## 2022-01-26 NOTE — Progress Notes (Signed)
CBC and CMP normal

## 2022-02-08 ENCOUNTER — Encounter: Payer: Self-pay | Admitting: Physician Assistant

## 2022-02-08 ENCOUNTER — Ambulatory Visit (INDEPENDENT_AMBULATORY_CARE_PROVIDER_SITE_OTHER): Payer: 59

## 2022-02-08 ENCOUNTER — Ambulatory Visit (INDEPENDENT_AMBULATORY_CARE_PROVIDER_SITE_OTHER): Payer: 59 | Admitting: Physician Assistant

## 2022-02-08 VITALS — BP 129/81 | HR 90 | Resp 12 | Ht 67.0 in | Wt 199.6 lb

## 2022-02-08 DIAGNOSIS — R232 Flushing: Secondary | ICD-10-CM

## 2022-02-08 DIAGNOSIS — M79641 Pain in right hand: Secondary | ICD-10-CM | POA: Diagnosis not present

## 2022-02-08 DIAGNOSIS — M79642 Pain in left hand: Secondary | ICD-10-CM

## 2022-02-08 DIAGNOSIS — M7711 Lateral epicondylitis, right elbow: Secondary | ICD-10-CM | POA: Diagnosis not present

## 2022-02-08 DIAGNOSIS — M0609 Rheumatoid arthritis without rheumatoid factor, multiple sites: Secondary | ICD-10-CM | POA: Diagnosis not present

## 2022-02-08 DIAGNOSIS — Z8639 Personal history of other endocrine, nutritional and metabolic disease: Secondary | ICD-10-CM

## 2022-02-08 DIAGNOSIS — Z8669 Personal history of other diseases of the nervous system and sense organs: Secondary | ICD-10-CM

## 2022-02-08 DIAGNOSIS — R931 Abnormal findings on diagnostic imaging of heart and coronary circulation: Secondary | ICD-10-CM

## 2022-02-08 DIAGNOSIS — M545 Low back pain, unspecified: Secondary | ICD-10-CM

## 2022-02-08 DIAGNOSIS — R238 Other skin changes: Secondary | ICD-10-CM

## 2022-02-08 DIAGNOSIS — M19072 Primary osteoarthritis, left ankle and foot: Secondary | ICD-10-CM

## 2022-02-08 DIAGNOSIS — M7062 Trochanteric bursitis, left hip: Secondary | ICD-10-CM

## 2022-02-08 DIAGNOSIS — M79672 Pain in left foot: Secondary | ICD-10-CM | POA: Diagnosis not present

## 2022-02-08 DIAGNOSIS — Z8709 Personal history of other diseases of the respiratory system: Secondary | ICD-10-CM

## 2022-02-08 DIAGNOSIS — Z780 Asymptomatic menopausal state: Secondary | ICD-10-CM

## 2022-02-08 DIAGNOSIS — Z79899 Other long term (current) drug therapy: Secondary | ICD-10-CM

## 2022-02-08 DIAGNOSIS — M7061 Trochanteric bursitis, right hip: Secondary | ICD-10-CM

## 2022-02-08 DIAGNOSIS — M19071 Primary osteoarthritis, right ankle and foot: Secondary | ICD-10-CM

## 2022-02-08 DIAGNOSIS — Z8719 Personal history of other diseases of the digestive system: Secondary | ICD-10-CM

## 2022-02-08 DIAGNOSIS — G8929 Other chronic pain: Secondary | ICD-10-CM

## 2022-02-08 NOTE — Progress Notes (Signed)
X-rays of left foot are consistent with osteoarthritic changes.  No acute pathology noted.   Please place referral to Dr. Jacqualyn Posey for further evaluation and management.

## 2022-02-09 ENCOUNTER — Ambulatory Visit (INDEPENDENT_AMBULATORY_CARE_PROVIDER_SITE_OTHER): Payer: 59 | Admitting: Podiatry

## 2022-02-09 ENCOUNTER — Encounter: Payer: Self-pay | Admitting: Podiatry

## 2022-02-09 DIAGNOSIS — M7672 Peroneal tendinitis, left leg: Secondary | ICD-10-CM

## 2022-02-09 MED ORDER — MELOXICAM 15 MG PO TABS: 15.0000 mg | ORAL_TABLET | Freq: Every day | ORAL | 0 refills | Status: DC

## 2022-02-09 NOTE — Progress Notes (Signed)
  Subjective:  Patient ID: [name], female    DOB: 17-Jun-1966,   MRN: [id number]  Chief Complaint  Patient presents with   Foot Pain     LEFT TENDONITIS    56 y.o. female presents for concern of left foot tendonitis. It has been present for weeks. Relates pain on the outside of the foot especially when walking. Has tried Voltaren gel without help. Relates burning pain that varies. Denies diabetes.  Does have a history of RA.   Marland Kitchen Denies any other pedal complaints. Denies n/v/f/c.   Past Medical History:  Diagnosis Date   Arthritis    ra zero negative   Asthma    mild/moderate persistent asthma   Complication of anesthesia    slow to awaken 25 yrs ago   Elevated liver function tests dr Neil Crouch pcp manages   for last year   Family history of breast cancer    Fibroid    Fractured rib    Frequent headaches    GERD (gastroesophageal reflux disease)    Gluten intolerance    H/O seasonal allergies    History of chicken pox    History of COVID-19 07/2019   high fever sob, x 7 days all symptoms resolved   Left bundle branch block 02/2019   Dr.Paula Ross   Migraines    Pneumonia    PONV (postoperative nausea and vomiting)    ponv with several surgeries, likes scopolamine patch   Thyroid disease    Multiple benign nodules--thyroid removed    Objective:  Physical Exam: Vascular: DP/PT pulses 2/4 bilateral. CFT <3 seconds. Normal hair growth on digits. No edema.  Skin. No lacerations or abrasions bilateral feet.  Musculoskeletal: MMT 5/5 bilateral lower extremities in DF, PF, Inversion and Eversion. Deceased ROM in DF of ankle joint. Tender to palpation at insertion of peroneal tendon and tracking proximal along tendon to lateral malleolus. Pain with DF of the foot and some with eversion. No pain with PF or inversion. No pain anywhere else along foot.  Neurological: Sensation intact to light touch.   Assessment:   1. Peroneal tendonitis, left      Plan:  Patient was  evaluated and treated and all questions answered. X-rays reviewed and discussed with patient. No acute fractures or dislocations noted. Reviewed notes from rheumatology  Discussed peroneal tendinitis and treatment options at length with patient Discussed stretching exercises and provided handout. Prescription for meloxicam provided Dispensed Tri-Lock ankle brace. Discussed that if the symptoms do not improve can consider PT/MRI. Patient to return in 6 to 8 weeks or sooner if symptoms fail to improve or worsen.   Lorenda Peck, DPM

## 2022-02-09 NOTE — Patient Instructions (Signed)
Peroneal Tendinopathy Rehab Ask your health care provider which exercises are safe for you. Do exercises exactly as told by your health care provider and adjust them as directed. It is normal to feel mild stretching, pulling, tightness, or discomfort as you do these exercises. Stop right away if you feel sudden pain or your pain gets worse. Do not begin these exercises until told by your health care provider. Stretching and range-of-motion exercises These exercises warm up your muscles and joints and improve the movement and flexibility of your ankle. These exercises also help to relieve pain and stiffness. Gastroc and soleus stretch, standing This is an exercise in which you stand on a step and use your body weight to stretch your calf muscles. To do this exercise: Stand on the edge of a step on the ball of your left / right foot. The ball of your foot is on the walking surface, right under your toes. Keep your other foot firmly on the same step. Hold on to the wall, a railing, or a chair for balance. Slowly lift your other foot, allowing your body weight to press your left / right heel down over the edge of the step. You should feel a stretch in your left / right calf (gastrocnemius and soleus). Hold this position for __________ seconds. Return both feet to the step. Repeat this exercise with a slight bend in your left / right knee. Repeat __________ times with your left / right knee straight and __________ times with your left / right knee bent. Complete this exercise __________ times a day. Strengthening exercises These exercises build strength and endurance in your foot and ankle. Endurance is the ability to use your muscles for a long time, even after they get tired. Ankle dorsiflexion with band  Secure a rubber exercise band or tube to an object, such as a table leg, that will not move when the band is pulled. Secure the other end of the band around your left / right foot. Sit on the  floor, facing the object with your left / right leg extended. The band or tube should be slightly tense when your foot is relaxed. Slowly flex your left / right ankle and toes to bring your foot toward you (dorsiflexion). Hold this position for __________ seconds. Let the band or tube slowly pull your foot back to the starting position. Repeat __________ times. Complete this exercise __________ times a day. Ankle eversion Sit on the floor with your legs straight out in front of you. Loop a rubber exercise band or tube around the ball of your left / right foot. The ball of your foot is on the walking surface, right under your toes. Hold the ends of the band in your hands, or secure the band to a stable object. The band or tube should be slightly tense when your foot is relaxed. Slowly push your foot outward, away from your other leg (eversion). Hold this position for __________ seconds. Slowly return your foot to the starting position. Repeat __________ times. Complete this exercise __________ times a day. Plantar flexion, standing This exercise is sometimes called standing heel raise. Stand with your feet shoulder-width apart. Place your hands on a wall or table to steady yourself as needed, but try not to use it for support. Keep your weight spread evenly over the width of your feet while you slowly rise up on your toes (plantar flexion). If told by your health care provider: Shift your weight toward your left / right   leg until you feel challenged. Stand on your left / right leg only. Hold this position for __________ seconds. Repeat __________ times. Complete this exercise __________ times a day. Single leg stand Without shoes, stand near a railing or in a doorway. You may hold on to the railing or door frame as needed. Stand on your left / right foot. Keep your big toe down on the floor and try to keep your arch lifted. Do not roll to the outside of your foot. If this exercise is too  easy, you can try it with your eyes closed or while standing on a pillow. Hold this position for __________ seconds. Repeat __________ times. Complete this exercise __________ times a day. This information is not intended to replace advice given to you by your health care provider. Make sure you discuss any questions you have with your health care provider. Document Revised: 12/24/2018 Document Reviewed: 12/24/2018 Elsevier Patient Education  2023 [employer] Inc[employer]  

## 2022-02-21 ENCOUNTER — Other Ambulatory Visit: Payer: Self-pay | Admitting: Neurosurgery

## 2022-02-21 DIAGNOSIS — M5136 Other intervertebral disc degeneration, lumbar region: Secondary | ICD-10-CM

## 2022-02-22 LAB — COLOGUARD: COLOGUARD: NEGATIVE

## 2022-02-27 ENCOUNTER — Other Ambulatory Visit: Payer: Self-pay | Admitting: Rheumatology

## 2022-02-27 DIAGNOSIS — M0609 Rheumatoid arthritis without rheumatoid factor, multiple sites: Secondary | ICD-10-CM

## 2022-02-27 DIAGNOSIS — Z79899 Other long term (current) drug therapy: Secondary | ICD-10-CM

## 2022-03-08 ENCOUNTER — Ambulatory Visit
Admission: RE | Admit: 2022-03-08 | Discharge: 2022-03-08 | Disposition: A | Discharge status: Home / Self Care | Payer: 59 | Source: Ambulatory Visit | Attending: Neurosurgery | Admitting: Neurosurgery

## 2022-03-08 DIAGNOSIS — M5136 Other intervertebral disc degeneration, lumbar region: Secondary | ICD-10-CM

## 2022-03-16 ENCOUNTER — Other Ambulatory Visit: Payer: Self-pay | Admitting: Physician Assistant

## 2022-03-16 DIAGNOSIS — M1230 Palindromic rheumatism, unspecified site: Secondary | ICD-10-CM

## 2022-03-16 NOTE — Telephone Encounter (Signed)
Next Visit: 04/19/2022  Last Visit: 02/08/2022  Labs: 01/25/2022, CBC and CMP normal.  Eye exam: 05/12/2021   Current Dose per office note 02/08/2022: Plaquenil 200 mg 1 tablet by mouth twice daily  WN:PIOPPUGGPC arthritis of multiple sites with negative rheumatoid factor   Last Fill: 12/12/2021  Okay to refill Plaquenil?

## 2022-03-20 ENCOUNTER — Other Ambulatory Visit: Payer: Self-pay | Admitting: Rheumatology

## 2022-03-20 DIAGNOSIS — Z79899 Other long term (current) drug therapy: Secondary | ICD-10-CM

## 2022-03-20 DIAGNOSIS — M0609 Rheumatoid arthritis without rheumatoid factor, multiple sites: Secondary | ICD-10-CM

## 2022-03-20 NOTE — Telephone Encounter (Signed)
Next Visit: 04/19/2022  Last Visit: 02/08/2022  Last Fill: 12/22/2021  DX: Rheumatoid arthritis of multiple sites with negative rheumatoid factor   Current Dose per office note 02/08/2022:  Arava 10 mg 1 tablet by mouth daily   Labs: 01/25/2022, CBC and CMP normal.  Okay to refill Arava?

## 2022-03-22 ENCOUNTER — Encounter: Payer: Self-pay | Admitting: Physician Assistant

## 2022-03-23 ENCOUNTER — Other Ambulatory Visit: Payer: Self-pay

## 2022-03-23 MED ORDER — LEVOTHYROXINE SODIUM 150 MCG PO TABS: 150.0000 ug | ORAL_TABLET | Freq: Every day | ORAL | 0 refills | Status: DC

## 2022-03-24 ENCOUNTER — Other Ambulatory Visit: Payer: Self-pay | Admitting: Physician Assistant

## 2022-03-27 ENCOUNTER — Other Ambulatory Visit: Payer: Self-pay

## 2022-03-30 ENCOUNTER — Encounter: Payer: Self-pay | Admitting: Podiatry

## 2022-03-30 ENCOUNTER — Ambulatory Visit (INDEPENDENT_AMBULATORY_CARE_PROVIDER_SITE_OTHER): Payer: 59 | Admitting: Podiatry

## 2022-03-30 DIAGNOSIS — M7672 Peroneal tendinitis, left leg: Secondary | ICD-10-CM | POA: Diagnosis not present

## 2022-03-30 MED ORDER — DEXAMETHASONE SODIUM PHOSPHATE 120 MG/30ML IJ SOLN
4.0000 mg | Freq: Once | INTRAMUSCULAR | Status: AC
  Administered 2022-03-30: 4 mg via INTRA_ARTICULAR

## 2022-03-30 MED ORDER — MELOXICAM 15 MG PO TABS: 15.0000 mg | ORAL_TABLET | Freq: Every day | ORAL | 0 refills | Status: DC

## 2022-03-30 NOTE — Progress Notes (Signed)
  Subjective:  Patient ID: [name], female    DOB: [birth year],   MRN: [id number]  Chief Complaint  Patient presents with   Tendonitis     l peroneal     56 y.o. female presents for follow-up of left peroneal tendonitis. Relates it is doing about the same. Has been stretching and meloxicam helped a bit to take edge off. States the brace was just causing pain in other parts of her foot. Denies diabetes.  Does have a history of RA. Likely will be getting some injections in her back for back pain . Denies any other pedal complaints. Denies n/v/f/c.   Past Medical History:  Diagnosis Date   Arthritis    ra zero negative   Asthma    mild/moderate persistent asthma   Complication of anesthesia    slow to awaken 25 yrs ago   Elevated liver function tests dr Neil Crouch pcp manages   for last year   Family history of breast cancer    Fibroid    Fractured rib    Frequent headaches    GERD (gastroesophageal reflux disease)    Gluten intolerance    H/O seasonal allergies    History of chicken pox    History of COVID-19 07/2019   high fever sob, x 7 days all symptoms resolved   Left bundle branch block 02/2019   Dr.Paula Ross   Migraines    Pneumonia    PONV (postoperative nausea and vomiting)    ponv with several surgeries, likes scopolamine patch   Thyroid disease    Multiple benign nodules--thyroid removed    Objective:  Physical Exam: Vascular: DP/PT pulses 2/4 bilateral. CFT <3 seconds. Normal hair growth on digits. No edema.  Skin. No lacerations or abrasions bilateral feet.  Musculoskeletal: MMT 5/5 bilateral lower extremities in DF, PF, Inversion and Eversion. Deceased ROM in DF of ankle joint. Tender to palpation at insertion of peroneal tendon and tracking proximal along tendon to lateral malleolus. Pain with DF of the foot and some with eversion. No pain with PF or inversion. No pain anywhere else along foot.  Neurological: Sensation intact to light touch.    Assessment:   1. Peroneal tendonitis, left      Plan:  Patient was evaluated and treated and all questions answered. X-rays reviewed and discussed with patient. No acute fractures or dislocations noted. Reviewed notes from rheumatology  Discussed peroneal tendinitis and treatment options at length with patient Discussed stretching exercises and provided handout. Refill for meloxicam  Continue with brace as tolerated.  Amb ref to PT  Injection requested today. Procedure below.  Discussed potential for some pain coming from the back and possible the back injections planned may help with foot pain.  Discussed that if the symptoms do not improve can consider MRI. Patient to return in \ 8 weeks or sooner if symptoms fail to improve or worsen.  Procedure: Injection Tendon/Ligament Discussed alternatives, risks, complications and verbal consent was obtained.  Location: Left peroneal tendon. Skin Prep: Alcohol. Injectate: 1cc 0.5% marcaine plain, 1 cc dexamethasone.  Disposition: Patient tolerated procedure well. Injection site dressed with a band-aid.  Post-injection care was discussed and return precautions discussed.    Lorenda Peck, DPM

## 2022-03-31 ENCOUNTER — Encounter: Payer: Self-pay | Admitting: Internal Medicine

## 2022-03-31 ENCOUNTER — Encounter: Payer: Self-pay | Admitting: Physician Assistant

## 2022-04-03 MED ORDER — ROSUVASTATIN CALCIUM 5 MG PO TABS: 2.5000 mg | ORAL_TABLET | Freq: Every day | ORAL | 0 refills | Status: DC

## 2022-04-04 ENCOUNTER — Ambulatory Visit: Payer: 59 | Attending: Podiatry | Admitting: Physical Therapy

## 2022-04-04 ENCOUNTER — Other Ambulatory Visit: Payer: Self-pay

## 2022-04-04 DIAGNOSIS — M6281 Muscle weakness (generalized): Secondary | ICD-10-CM

## 2022-04-04 DIAGNOSIS — M25672 Stiffness of left ankle, not elsewhere classified: Secondary | ICD-10-CM

## 2022-04-04 DIAGNOSIS — R262 Difficulty in walking, not elsewhere classified: Secondary | ICD-10-CM | POA: Diagnosis not present

## 2022-04-04 DIAGNOSIS — M79672 Pain in left foot: Secondary | ICD-10-CM

## 2022-04-04 DIAGNOSIS — M7672 Peroneal tendinitis, left leg: Secondary | ICD-10-CM | POA: Insufficient documentation

## 2022-04-04 DIAGNOSIS — M25572 Pain in left ankle and joints of left foot: Secondary | ICD-10-CM | POA: Diagnosis not present

## 2022-04-04 NOTE — Therapy (Unsigned)
OUTPATIENT PHYSICAL THERAPY FOOT/ANKLE EVALUATION   Patient Name: [name] MRN: [id number] DOB:03-Jul-1966, 56 y.o., female Today's Date: 04/05/2022   PT End of Session - 04/04/22 1740     Visit Number 1    Number of Visits 12    Date for PT Re-Evaluation 05/16/22    Authorization Type [employer]    PT Start Time 3846    PT Stop Time 1615    PT Time Calculation (min) 45 min    Activity Tolerance Patient tolerated treatment well    Behavior During Therapy WFL for tasks assessed/performed              Past Medical History:  Diagnosis Date   Arthritis    ra zero negative   Asthma    mild/moderate persistent asthma   Complication of anesthesia    slow to awaken 25 yrs ago   Elevated liver function tests dr Neil Crouch pcp manages   for last year   Family history of breast cancer    Fibroid    Fractured rib    Frequent headaches    GERD (gastroesophageal reflux disease)    Gluten intolerance    H/O seasonal allergies    History of chicken pox    History of COVID-19 07/2019   high fever sob, x 7 days all symptoms resolved   Left bundle branch block 02/2019   Dr.Paula Ross   Migraines    Pneumonia    PONV (postoperative nausea and vomiting)    ponv with several surgeries, likes scopolamine patch   Thyroid disease    Multiple benign nodules--thyroid removed   Past Surgical History:  Procedure Laterality Date   ANTERIOR AND POSTERIOR REPAIR N/A 06/14/2020   Procedure: ANTERIOR (CYSTOCELE) AND POSTERIOR REPAIR (RECTOCELE);  Surgeon: Nunzio Cobbs, MD;  Location: [facility];  Service: Gynecology;  Laterality: N/A;   BLADDER SUSPENSION N/A 06/14/2020   Procedure: TRANSVAGINAL TAPE (TVT) PROCEDURE/EXACT MIDURETHRAL SLING;  Surgeon: Nunzio Cobbs, MD;  Location: [facility];  Service: Gynecology;  Laterality: N/A;  EXACT MIDURETHRAL SLING   CARDIAC CATHETERIZATION  2009   results normal done in [place]    CHOLECYSTECTOMY  1995   laparoscopic   CYSTOSCOPY N/A 06/14/2020   Procedure: CYSTOSCOPY;  Surgeon: Nunzio Cobbs, MD;  Location: [facility];  Service: Gynecology;  Laterality: N/A;   HAMMER TOE SURGERY Bilateral 2009   HERNIA REPAIR  2015   Has abdominal wall mesh - [place], [place].  Laparoscopic incisional hernia repari with 7 x 9 inch Ventralight ST with Echo mesh   LAPAROSCOPIC HYSTERECTOMY  2007   Supracervical hysterectomy with cystosctopy [place], [place] partial   THYROID SURGERY  2013   total thyroidectomy due to 11 nodules   Patient Active Problem List   Diagnosis Date Noted   Rheumatoid arthritis of multiple sites with negative rheumatoid factor ([place]) 12/22/2021   Status post surgery 06/14/2020   Genetic testing 02/18/2020   Family history of breast cancer    High risk medication use 10/30/2019   Left bundle branch block 04/28/2019   Right wrist pain 03/13/2019   Hamstring strain, left, initial encounter 02/20/2019   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 female presents with dx of left peroneal tendonitis. Has been stretching and taking meloxicam from podiatry. Has been wearing a brace for 6 weeks but it caused increased pain on the top of her foot. Pt states she got an injection during her last podiatry visit and it has helped. Reports it is still swollen but pain can fluctuate. Pt reports burning  pain on the side of her foot and occasional throbbing. Pt reports the pain had slowly came on. Pain is worse in the morning and seems to ease throughout the day.   PERTINENT HISTORY: Arthritis Asthma Multiple surgeries -- has pins in bilat toes Endorses prior back and bilat hip pain   PAIN:  Are you having pain? Yes: NPRS scale: 2 since the shot/10 Pain location: side of ankle Pain description: throbbing, burning Aggravating factors: Walking, separating toes and pointing toes, worse in the morning Relieving factors: Icing, CBD   PRECAUTIONS: None  WEIGHT BEARING RESTRICTIONS No  FALLS:  Has patient fallen in last 6 months? No  LIVING ENVIRONMENT: Lives with: lives with their family Lives in: House/apartment Stairs: Yes: Internal: 17 steps; on right going up; external: a few stairs Has following equipment at home: None  OCCUPATION: At home  PLOF: Independent  PATIENT GOALS  Walk pain free, better mobility   OBJECTIVE:   DIAGNOSTIC FINDINGS: Previous foot x-ray  PATIENT SURVEYS:  FOTO 63; predicted 73 visit #11  COGNITION:  Overall cognitive status: Within functional limits for tasks assessed     SENSATION: WFL   MUSCLE LENGTH: Hamstrings: Right 90 deg; Left 70 deg  POSTURE: No Significant postural limitations  PALPATION: Tender to palpation more along extensor digitorum brevis Hypermobile fibular head upon palpation -- PA mobs pt can feel nerve in her hip  LOWER EXTREMITY ROM:  Active ROM Right eval Left eval  Hip flexion    Hip extension    Hip abduction    Hip adduction    Hip internal rotation    Hip external rotation    Knee flexion    Knee extension    Ankle dorsiflexion 12 -5 (increases pain)  Ankle plantarflexion 35 35 (feels pull)  Ankle inversion 35 35 (burning)  Ankle eversion 10 2 (increases pain)  Big extension [facility] WFL   Little toe extension WFL WFL (increases pain)    (Blank rows = not tested)  LOWER EXTREMITY MMT:  MMT  Right eval Left eval  Hip flexion  4-/5  Hip extension  3+/5  Hip abduction  3+/5  Hip adduction    Hip internal rotation    Hip external rotation    Knee flexion  5/5  Knee extension  5/5  Ankle dorsiflexion  4+/5  Ankle plantarflexion  4+/5  Ankle inversion  4+/5  Ankle eversion  4+/5   (Blank rows = not tested)    FUNCTIONAL TESTS:  Peroneal nerve glide (+) tension test on L SLS: 1 min L & R Single leg squat: 3/4 squat each, increased burning on L Heel raise less on L vs R single leg (nerve pain/burning with foot facing anteriorly, improved with eversion)  GAIT: Distance walked: In clinic Assistive device utilized: None Level of assistance: Complete Independence Comments: [facility]  TODAY'S TREATMENT: 04/05/2022 See HEP exercises below  PATIENT EDUCATION:  Education details: Exam findings, POC, initial HEP Person educated: Patient Education method: Explanation and Handouts Education comprehension: verbalized understanding   HOME EXERCISE PROGRAM: Access Code: [email or web address] URL: [email or web address] Date: 04/04/2022 Prepared by: Estill Bamberg April Thurnell Garbe  Exercises - Gastroc Stretch on Wall  - 1 x daily - 7 x weekly - 2 sets - 30 sec hold - Soleus Stretch on Wall  - 1 x daily - 7 x weekly - 2 sets - 30 sec hold - Supine Peroneal Nerve Glide (Mirrored)  - 1 x daily - 7 x weekly - 1 sets - 10 reps  ASSESSMENT:  CLINICAL IMPRESSION: Patient is a 56 y.o. F who was seen today for physical therapy evaluation and treatment for L lateral foot/ankle pain. PMH significant for pins in bilat little toes, back pain and bilat hip pain. Assessment demos pain with toe extension resistance and ankle DF, pulling and nerve/burning pain with ankle PF and L>R LE weakness. Nerve tension testing demos pt has taut peroneal nerve. Pt is tender to palpation along L extensor digitorum brevis. Pt would benefit from PT to address these issues for return to normal mobility.     OBJECTIVE IMPAIRMENTS decreased activity tolerance, decreased endurance, decreased mobility, difficulty walking, decreased ROM, decreased strength, increased fascial restrictions, increased muscle spasms, impaired sensation, and pain.   ACTIVITY LIMITATIONS lifting, standing, squatting, sleeping, stairs, transfers, and locomotion level  PARTICIPATION LIMITATIONS: shopping, community activity, and yard work  PERSONAL FACTORS Past/current experiences and 1-2 comorbidities: Hip and back pain  are also affecting patient's functional outcome.   REHAB POTENTIAL: Good  CLINICAL DECISION MAKING: Evolving/moderate complexity  EVALUATION COMPLEXITY: Moderate   GOALS: Goals reviewed with patient? Yes  SHORT TERM GOALS: Target date: 04/25/2022   Pt will be ind with initial HEP Baseline: Goal status: INITIAL  2.  Pt will demo L = R ankle ROM for improved mobility on stairs Baseline: See above Goal status: INITIAL  3.  Pt will demo L = R hamstring length for reduced muscle imbalance Baseline:  Goal status: INITIAL   LONG TERM GOALS: Target date: 05/16/2022   Pt will be ind with advanced HEP Baseline:  Goal status: INITIAL  2.  Pt will report >/=50% decrease in pain/burning for improved comfort with normal activities Baseline:  Goal status: INITIAL  3.  Pt with demo at least 4+/5 L LE strength for improved weightbearing Baseline:  Goal status: INITIAL  4.  Pt will have increased FOTO score to >/=73 Baseline:  Goal status: INITIAL     PLAN: PT FREQUENCY: 2x/week  PT DURATION: 6 weeks  PLANNED INTERVENTIONS: Therapeutic exercises, Therapeutic activity, Neuromuscular re-education, Balance training, Gait training, Patient/Family education, Self Care, Joint mobilization, Stair training, DME instructions, Aquatic Therapy, Dry Needling, Cryotherapy, Moist heat, Taping, Vasopneumatic device, Ultrasound, Ionotophoresis '4mg'$ /ml Dexamethasone, Manual therapy, and  Re-evaluation  PLAN FOR NEXT SESSION: Review HEP. Work on ankle/intrinsic foot muscle strengthening and gross L LE strengthening. Continue nerve glides as able. Stretch hamstrings.    Errin Chewning April Ma L Deedee Lybarger, PT, DPT 04/05/2022, 8:38 AM

## 2022-04-05 NOTE — Progress Notes (Signed)
Office Visit Note  Patient: [name]             Date of Birth: 1966-04-04           MRN: [id number]             PCP: Fredirick Lathe, PA-C Referring: Allwardt, Randa Evens, PA-C Visit Date: 04/19/2022 Occupation: '@GUAROCC'$ @  Subjective:  Follow-up (Stiffness, joint pain. )   History of Present Illness: [name] is a 56 y.o. female with history of seronegative rheumatoid arthritis and osteoarthritis.  She states she has been doing better on the combination of leflunomide 10 mg p.o. daily and hydroxychloroquine 200 mg p.o. twice daily.  She continues to have some stiffness in her joints but she has not noticed any joint swelling.  She notices a stiffness in her right wrist and her left ankle.  She was seen by the podiatrist for her left ankle joint and had a cortisone injection which was helpful.  She denies pain and discomfort in any other joints.  She states that since she started leflunomide she has noticed some hair loss although she does not want to change the medication as she had good results.  She continues to have some discomfort in bilateral trochanteric bursa.  The right lateral epicondylitis has resolved.  She has off-and-on discomfort in her lower back.  She is also noticed a rash on her both arms and she is concerned if it is psoriasis or eczema.  Activities of Daily Living:  Patient reports morning stiffness for 1-2 hours.   Patient Reports nocturnal pain.  Difficulty dressing/grooming: Denies Difficulty climbing stairs: Denies Difficulty getting out of chair: Denies Difficulty using hands for taps, buttons, cutlery, and/or writing: Reports  Review of Systems  Constitutional:  Positive for fatigue.  HENT:  Positive for mouth dryness. Negative for mouth sores.   Eyes:  Positive for dryness.  Respiratory:  Positive for shortness of breath.   Cardiovascular:  Positive for chest pain. Negative for palpitations.  Gastrointestinal:  Negative for blood in stool,  constipation and diarrhea.  Endocrine: Negative for increased urination.  Genitourinary:  Negative for involuntary urination.  Musculoskeletal:  Positive for joint pain, joint pain, joint swelling and morning stiffness. Negative for myalgias, muscle weakness, muscle tenderness and myalgias.  Skin:  Positive for rash, hair loss and sensitivity to sunlight. Negative for color change.  Allergic/Immunologic: Negative for susceptible to infections.  Neurological:  Positive for headaches. Negative for dizziness.  Hematological:  Negative for swollen glands.  Psychiatric/Behavioral:  Positive for sleep disturbance. Negative for depressed mood. The patient is not nervous/anxious.     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   Primary osteoarthritis of both feet 10/09/2018   Hx of migraines 09/27/2018   History of gastroesophageal reflux (GERD) 09/27/2018   Palindromic rheumatism, 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   Moderate persistent asthma 07/05/2018   Hypothyroid 06/28/2018    Past Medical History:  Diagnosis Date   Arthritis    ra zero negative   Asthma    mild/moderate persistent asthma   Complication of anesthesia  slow to awaken 25 yrs ago   Elevated liver function tests dr Neil Crouch pcp manages   for last year   Family history of breast cancer    Fibroid    Fractured rib    Frequent headaches    GERD (gastroesophageal reflux disease)    Gluten intolerance    H/O seasonal allergies    History of chicken pox    History of COVID-19 07/2019   high fever sob, x 7 days  all symptoms resolved   Injury of peroneal tendon of left foot, initial encounter    Left bundle branch block 02/2019   Dr.Paula Ross   Migraines    Pneumonia    PONV (postoperative nausea and vomiting)    ponv with several surgeries, likes scopolamine patch   Thyroid disease    Multiple benign nodules--thyroid removed    Family History  Problem Relation Age of Onset   Arthritis Mother    Cancer Mother 56       breast ca--dec age 38   Miscarriages / [place] Mother    Breast cancer Mother 80   Glaucoma Father    Diabetes Father    Heart attack Father    Heart disease Father        pacemaker    Hyperlipidemia Father    Hypertension Father    Stroke Father        x4   COPD Sister    Peripheral Artery Disease Sister    Asthma Brother    Birth defects Brother    Cancer Brother        skin   Hyperlipidemia Brother    Alcohol abuse Brother    Early death Brother        suicide   Mental illness Brother    Heart attack Maternal Grandmother    Heart disease Maternal Grandmother    Hyperlipidemia Maternal Grandmother    Stroke Maternal Grandmother    Alcohol abuse Paternal Grandmother    Cancer Paternal Grandmother        colon   Cancer Paternal Grandfather        breast   Heart attack Paternal Grandfather    Breast cancer Paternal Grandfather 2   Asthma Daughter    Asthma Son    Asthma Son    Breast cancer Maternal Aunt 80   Kidney cancer Paternal Aunt 26   Breast cancer Paternal Aunt 40   Throat cancer Paternal Uncle    Thyroid cancer Paternal Uncle    Breast cancer Cousin 38   Prostate cancer Cousin    Past Surgical History:  Procedure Laterality Date   ANTERIOR AND POSTERIOR REPAIR N/A 06/14/2020   Procedure: ANTERIOR (CYSTOCELE) AND POSTERIOR REPAIR (RECTOCELE);  Surgeon: Nunzio Cobbs, MD;  [facility];  Service: Gynecology;  Laterality: N/A;   BLADDER SUSPENSION N/A 06/14/2020   Procedure: TRANSVAGINAL TAPE (TVT)  PROCEDURE/EXACT MIDURETHRAL SLING;  Surgeon: Nunzio Cobbs, MD;  Location: [facility];  Service: Gynecology;  Laterality: N/A;  EXACT MIDURETHRAL SLING   CARDIAC CATHETERIZATION  2009   results normal done in [place]   CHOLECYSTECTOMY  1995   laparoscopic   CYSTOSCOPY N/A 06/14/2020   Procedure: CYSTOSCOPY;  Surgeon: Nunzio Cobbs, MD;  Location: [facility];  Service: Gynecology;  Laterality: N/A;   HAMMER TOE SURGERY Bilateral 2009   HERNIA REPAIR  2015   Has abdominal wall mesh - [place],  [place].  Laparoscopic incisional hernia repari with 7 x 9 inch Ventralight ST with Echo mesh   LAPAROSCOPIC HYSTERECTOMY  2007   Supracervical hysterectomy with cystosctopy [place], [place] partial   THYROID SURGERY  2013   total thyroidectomy due to 11 nodules   Social History   Social History Narrative   Right Handed   Lives in a two story home   Immunization History  Administered Date(s) Administered   Influenza,inj,Quad PF,6+ Mos 06/28/2018, 06/18/2019   Influenza-Unspecified 01/25/2018, 08/05/2020, 05/25/2021   Moderna Sars-Covid-2 Vaccination 01/27/2021   PFIZER(Purple Top)SARS-COV-2 Vaccination 12/08/2019, 01/05/2020, 08/05/2020   Pneumococcal Polysaccharide-23 10/22/2018   Tdap 06/28/2018   Zoster Recombinat (Shingrix) 04/15/2019, 07/16/2019     Objective: Vital Signs: BP 117/75 (BP Location: Left Arm, Patient Position: Sitting, Cuff Size: Small)   Pulse 94   Resp 14   Ht '5\' 7"'$  (1.702 m)   Wt 195 lb 3.2 oz (88.5 kg)   LMP  (LMP Unknown)   BMI 30.57 kg/m    Physical Exam Vitals and nursing note reviewed.  Constitutional:      Appearance: She is well-developed.  HENT:     Head: Normocephalic and atraumatic.  Eyes:     Conjunctiva/sclera: Conjunctivae normal.  Cardiovascular:     Rate and Rhythm: Normal rate and regular rhythm.     Heart sounds: Normal heart sounds.  Pulmonary:     Effort: Pulmonary effort is normal.      Breath sounds: Normal breath sounds.  Abdominal:     General: Bowel sounds are normal.     Palpations: Abdomen is soft.  Musculoskeletal:     Cervical back: Normal range of motion.  Lymphadenopathy:     Cervical: No cervical adenopathy.  Skin:    General: Skin is warm and dry.     Capillary Refill: Capillary refill takes less than 2 seconds.     Comments: Keratosis pilaris noted on bilateral shoulders and upper arms.  Neurological:     Mental Status: She is alert and oriented to person, place, and time.  Psychiatric:        Behavior: Behavior normal.      Musculoskeletal Exam: C-spine was in good range of motion.  Shoulder joints, elbow joints, wrist joints, MCPs PIPs and DIPs with good range of motion with no synovitis.  Hip joints and knee joints in good range of motion.  Knee joints were in good range of motion without any warmth swelling or effusion.  There was no tenderness over ankles or MTPs.  CDAI Exam: CDAI Score: 0.4  Patient Global: 2 mm; Provider Global: 2 mm Swollen: 0 ; Tender: 0  Joint Exam 04/19/2022   No joint exam has been documented for this visit   There is currently no information documented on the homunculus. Go to the Rheumatology activity and complete the homunculus joint exam.  Investigation: No additional findings.  Imaging: No results found.  Recent Labs: Lab Results  Component Value Date   WBC 5.9 01/25/2022   HGB 13.4 01/25/2022   PLT 293 01/25/2022   NA 140 01/25/2022   K 4.4 01/25/2022   CL 106 01/25/2022   CO2 24 01/25/2022   GLUCOSE 84 01/25/2022   BUN 13 01/25/2022   CREATININE 0.86 01/25/2022   BILITOT 0.5 01/25/2022   ALKPHOS 52 10/20/2021   AST 34 01/25/2022   ALT 27 01/25/2022   PROT 6.6 01/25/2022   ALBUMIN 4.7 10/20/2021   CALCIUM 9.5 01/25/2022   GFRAA 94 02/08/2021  [facility] NEGATIVE 12/20/2021    Speciality Comments: PLQ Eye Exam: 05/12/2021 [facility] Follow up in 1 year.   Inadequate response to  methotrexate, Enbrel, Humira, Orencia-hives everywhere Rinvoq-pneumonia Arava-11/2021  Procedures:  No procedures performed Allergies: Orencia [abatacept], Shellfish allergy, Strawberry (diagnostic), Gluten meal, Lortab [hydrocodone-acetaminophen], Morphine and related, and Augmentin [amoxicillin-pot clavulanate]   Assessment / Plan:     Visit Diagnoses: Rheumatoid arthritis of multiple sites with negative rheumatoid factor ([place]) - Diagnosed at [place] [facility].  Previous ultrasound examination performed showed synovitis in bilateral hands: She started leflunomide 10 mg p.o. daily on March 2023.  She has been taking that in combination with hydroxychloroquine 200 mg p.o. twice daily.  She has noticed improvement in her joint pain and joint swelling.  She continues to have some morning stiffness.  She reports occasional discomfort in her right wrist and left ankle joint.  She had good response to cortisone injection to her left ankle joint by the podiatrist.  She has noticed some hair loss since she has been on leflunomide.  She does not want to change the medication as it is working well for her.  No synovitis was noted on the examination today.  High risk medication use - Arava 10 mg 1 tablet by mouth daily and Plaquenil 200 mg 1 tablet by mouth twice daily. PLQ Eye Exam: 05/12/2021 -Labs obtained on Jan 25, 2022 were within normal limits.  Plan: CBC with Differential/Platelet, COMPLETE METABOLIC PANEL WITH GFR today and then every 3 months.  She was advised to stop leflunomide if she develops an infection and resume after the infection resolves.  Information on immunization was also placed in the AVS.  Hair loss-she has been experiencing hair loss on leflunomide.  Use of biotin and topical Rogaine was discussed.  If she has persistent symptoms we can switch to other medication options.  Pain in both hands -she complains of occasional discomfort in her right wrist joint.  No  synovitis was noted.  X-rays of both hands were unremarkable on 09/27/2018.    Trochanteric bursitis of both hips-she continues to have pain and discomfort in the trochanteric region.  IT band stretches were demonstrated and discussed.  Pain in left foot-she was seen by the podiatrist and had cortisone injection to her left ankle joint which was helpful.  Primary osteoarthritis of both feet -she has off-and-on discomfort in her feet.  X-rays of both feet from 09/27/2018 were reviewed today and are consistent with osteoarthritis and postsurgical changes.  No erosive changes were noted  Chronic bilateral low back pain without sciatica-she has intermittent discomfort in her lower back.  Keratosis pilaris-dry rash was noted over bilateral arms and shoulders consistent with keratosis pilaris.  Use of over-the-counter CeraVe with salicylic acid was discussed.  Other medical problems are listed as follows:  Decreased cardiac ejection fraction  Hx of migraines  History of gastroesophageal reflux (GERD)  History of asthma  Hot flashes  Dry scalp  History of hypothyroidism  Postmenopausal - DEXA ordered September 22, 2021 showed T score -0.3.    Orders: Orders Placed This Encounter  Procedures   CBC with Differential/Platelet   COMPLETE METABOLIC PANEL WITH GFR   No orders of the defined types were placed in this encounter.    Follow-Up Instructions: Return in about 3 months (around 07/20/2022) for Rheumatoid arthritis.   Bo Merino, MD  Note - This record has been created using Editor, commissioning.  Chart creation errors have been sought,  but may not always  have been located. Such creation errors do not reflect on  the standard of medical care.

## 2022-04-06 ENCOUNTER — Encounter: Payer: Self-pay | Admitting: Physical Therapy

## 2022-04-06 ENCOUNTER — Encounter: Payer: Self-pay | Admitting: Physician Assistant

## 2022-04-06 ENCOUNTER — Ambulatory Visit: Payer: 59 | Admitting: Physical Therapy

## 2022-04-06 ENCOUNTER — Ambulatory Visit (INDEPENDENT_AMBULATORY_CARE_PROVIDER_SITE_OTHER): Payer: 59 | Admitting: Physician Assistant

## 2022-04-06 VITALS — BP 121/86 | HR 87 | Temp 98.3°F | Ht 67.0 in | Wt 194.2 lb

## 2022-04-06 DIAGNOSIS — M7672 Peroneal tendinitis, left leg: Secondary | ICD-10-CM | POA: Diagnosis not present

## 2022-04-06 DIAGNOSIS — M6281 Muscle weakness (generalized): Secondary | ICD-10-CM

## 2022-04-06 DIAGNOSIS — M79672 Pain in left foot: Secondary | ICD-10-CM

## 2022-04-06 DIAGNOSIS — M25672 Stiffness of left ankle, not elsewhere classified: Secondary | ICD-10-CM

## 2022-04-06 DIAGNOSIS — R262 Difficulty in walking, not elsewhere classified: Secondary | ICD-10-CM

## 2022-04-06 DIAGNOSIS — N951 Menopausal and female climacteric states: Secondary | ICD-10-CM | POA: Diagnosis not present

## 2022-04-06 DIAGNOSIS — M25572 Pain in left ankle and joints of left foot: Secondary | ICD-10-CM

## 2022-04-06 MED ORDER — VEOZAH 45 MG PO TABS: 45.0000 mg | ORAL_TABLET | Freq: Every day | ORAL | 0 refills | Status: DC

## 2022-04-06 NOTE — Progress Notes (Signed)
Subjective:    Patient ID: [name], female    DOB: 08-May-1966, 56 y.o.   MRN: [id number]  Chief Complaint  Patient presents with   Medication Discussion    Pt wanting to discuss a new medication for hot flashes and recently FDA approved per patient.     HPI Patient is in today for discussion about new medication for menopausal hot flashes. All night long at least 10-12 per night. Several times in the day as well, every few hours. Sweats from her shins even. Even at PT a nerve was stimulated and triggered a hot flash. Says she drenches the sheets at night. The last two years have been the worst.  No fevers or chills. No Unintentional weight loss. No fatigue. No change in bowel movements. UTD with all screenings other than mammogram due this year.       Previously failed treatments for hot flashes: Effexor, Gabapentin, estrogen patches, Estroven  Past Medical History:  Diagnosis Date   Arthritis    ra zero negative   Asthma    mild/moderate persistent asthma   Complication of anesthesia    slow to awaken 25 yrs ago   Elevated liver function tests dr Neil Crouch pcp manages   for last year   Family history of breast cancer    Fibroid    Fractured rib    Frequent headaches    GERD (gastroesophageal reflux disease)    Gluten intolerance    H/O seasonal allergies    History of chicken pox    History of COVID-19 07/2019   high fever sob, x 7 days all symptoms resolved   Left bundle branch block 02/2019   Dr.Paula Ross   Migraines    Pneumonia    PONV (postoperative nausea and vomiting)    ponv with several surgeries, likes scopolamine patch   Thyroid disease    Multiple benign nodules--thyroid removed    Past Surgical History:  Procedure Laterality Date   ANTERIOR AND POSTERIOR REPAIR N/A 06/14/2020   Procedure: ANTERIOR (CYSTOCELE) AND POSTERIOR REPAIR (RECTOCELE);  Surgeon: Nunzio Cobbs, MD;  Location: [facility];  Service:  Gynecology;  Laterality: N/A;   BLADDER SUSPENSION N/A 06/14/2020   Procedure: TRANSVAGINAL TAPE (TVT) PROCEDURE/EXACT MIDURETHRAL SLING;  Surgeon: Nunzio Cobbs, MD;  Location[facility];  Service: Gynecology;  Laterality: N/A;  EXACT MIDURETHRAL SLING   CARDIAC CATHETERIZATION  2009   results normal done in [place]   CHOLECYSTECTOMY  1995   laparoscopic   CYSTOSCOPY N/A 06/14/2020   Procedure: CYSTOSCOPY;  Surgeon: Nunzio Cobbs, MD;  Location: [facility];  Service: Gynecology;  Laterality: N/A;   HAMMER TOE SURGERY Bilateral 2009   HERNIA REPAIR  2015   Has abdominal wall mesh - [place], [place].  Laparoscopic incisional hernia repari with 7 x 9 inch Ventralight ST with Echo mesh   LAPAROSCOPIC HYSTERECTOMY  2007   Supracervical hysterectomy with cystosctopy [place], [place] partial   THYROID SURGERY  2013   total thyroidectomy due to 11 nodules    Family History  Problem Relation Age of Onset   Arthritis Mother    Cancer Mother 43       breast ca--dec age 84   Miscarriages / [place] Mother    Breast cancer Mother 68   Glaucoma Father    Diabetes Father    Heart attack Father    Heart disease Father  pacemaker    Hyperlipidemia Father    Hypertension Father    Stroke Father        x4   COPD Sister    Peripheral Artery Disease Sister    Asthma Brother    Birth defects Brother    Cancer Brother        skin   Hyperlipidemia Brother    Alcohol abuse Brother    Early death Brother        suicide   Mental illness Brother    Heart attack Maternal Grandmother    Heart disease Maternal Grandmother    Hyperlipidemia Maternal Grandmother    Stroke Maternal Grandmother    Alcohol abuse Paternal Grandmother    Cancer Paternal Grandmother        colon   Cancer Paternal Grandfather        breast   Heart attack Paternal Grandfather    Breast cancer Paternal Grandfather 30   Asthma Daughter    Asthma Son     Asthma Son    Breast cancer Maternal Aunt 60   Kidney cancer Paternal Aunt 77   Breast cancer Paternal Aunt 80   Throat cancer Paternal Uncle    Thyroid cancer Paternal Uncle    Breast cancer Cousin 2   Prostate cancer Cousin     Social History   Tobacco Use   Smoking status: Never    Passive exposure: Never   Smokeless tobacco: Never  Vaping Use   Vaping Use: Never used  Substance Use Topics   Alcohol use: Yes    Comment: 2 glasses of wine/month   Drug use: Never     Allergies  Allergen Reactions   Orencia [Abatacept] Hives   Shellfish Allergy Anaphylaxis and Hives   Strawberry (Diagnostic) Anaphylaxis    Hazelnuts hives asthma, okra hives asthma Strawberry throat closes   Gluten Meal     Bloody diarrhea and hives   Lortab [Hydrocodone-Acetaminophen]     Swelling of tongue   Morphine And Related Itching    irritable   Augmentin [Amoxicillin-Pot Clavulanate] Rash    Review of Systems NEGATIVE UNLESS OTHERWISE INDICATED IN HPI      Objective:     BP 121/86 (BP Location: Left Arm)   Pulse 87   Temp 98.3 F (36.8 C) (Temporal)   Ht '5\' 7"'$  (1.702 m)   Wt 194 lb 3.2 oz (88.1 kg)   LMP  (LMP Unknown)   SpO2 98%   BMI 30.42 kg/m   Wt Readings from Last 3 Encounters:  04/06/22 194 lb 3.2 oz (88.1 kg)  02/08/22 199 lb 9.6 oz (90.5 kg)  12/22/21 206 lb 12.8 oz (93.8 kg)    BP Readings from Last 3 Encounters:  04/06/22 121/86  02/08/22 129/81  12/22/21 128/88     Physical Exam Vitals and nursing note reviewed.  Constitutional:      Appearance: Normal appearance. She is obese. She is not toxic-appearing.  HENT:     Head: Normocephalic and atraumatic.  Eyes:     Extraocular Movements: Extraocular movements intact.     Conjunctiva/sclera: Conjunctivae normal.     Pupils: Pupils are equal, round, and reactive to light.  Cardiovascular:     Rate and Rhythm: Normal rate and regular rhythm.     Pulses: Normal pulses.     Heart sounds: Normal heart  sounds.  Pulmonary:     Effort: Pulmonary effort is normal.     Breath sounds: Normal breath sounds.  Musculoskeletal:  General: Normal range of motion.     Cervical back: Normal range of motion and neck supple.  Skin:    General: Skin is warm and dry.  Neurological:     General: No focal deficit present.     Mental Status: She is alert and oriented to person, place, and time.  Psychiatric:        Mood and Affect: Mood normal.        Behavior: Behavior normal.        Thought Content: Thought content normal.        Judgment: Judgment normal.        Assessment & Plan:   Problem List Items Addressed This Visit   None Visit Diagnoses     Menopausal hot flushes    -  Primary        Meds ordered this encounter  Medications   Fezolinetant (VEOZAH) 45 MG TABS    Sig: Take 45 mg by mouth daily.    Dispense:  30 tablet    Refill:  0    Order Specific Question:   Supervising Provider    Answer:   Yong Channel, STEPHEN O [[id number]]    1. Menopausal hot flushes -SEVERE, going on for years, huge affect on sleep and quality of life -Previously failed treatments for hot flashes: Effexor, Gabapentin, estrogen patches, Estroven -Discussed Veozah; will try to get this approved, sent to pharmacy -Pt aware of risks vs benefits and possible adverse reactions. -I will also check online for savings cards.    Brandy Kabat M Honor Frison, PA-C

## 2022-04-06 NOTE — Therapy (Signed)
OUTPATIENT PHYSICAL THERAPY TREATMENT   Patient Name: [name] MRN: [id number] DOB:07-06-1966, 56 y.o., female Today's Date: 04/06/2022   PT End of Session - 04/06/22 1555     Visit Number 2    Number of Visits 12    Date for PT Re-Evaluation 05/16/22    Authorization Type U[employer]    PT Start Time 8466    PT Stop Time 1615    PT Time Calculation (min) 40 min    Activity Tolerance Patient tolerated treatment well    Behavior During Therapy WFL for tasks assessed/performed               Past Medical History:  Diagnosis Date   Arthritis    ra zero negative   Asthma    mild/moderate persistent asthma   Complication of anesthesia    slow to awaken 25 yrs ago   Elevated liver function tests dr Neil Crouch pcp manages   for last year   Family history of breast cancer    Fibroid    Fractured rib    Frequent headaches    GERD (gastroesophageal reflux disease)    Gluten intolerance    H/O seasonal allergies    History of chicken pox    History of COVID-19 07/2019   high fever sob, x 7 days all symptoms resolved   Left bundle branch block 02/2019   Dr.Paula Ross   Migraines    Pneumonia    PONV (postoperative nausea and vomiting)    ponv with several surgeries, likes scopolamine patch   Thyroid disease    Multiple benign nodules--thyroid removed   Past Surgical History:  Procedure Laterality Date   ANTERIOR AND POSTERIOR REPAIR N/A 06/14/2020   Procedure: ANTERIOR (CYSTOCELE) AND POSTERIOR REPAIR (RECTOCELE);  Surgeon: Nunzio Cobbs, MD;  Location: [facility];  Service: Gynecology;  Laterality: N/A;   BLADDER SUSPENSION N/A 06/14/2020   Procedure: TRANSVAGINAL TAPE (TVT) PROCEDURE/EXACT MIDURETHRAL SLING;  Surgeon: Nunzio Cobbs, MD;  Location: [facility];  Service: Gynecology;  Laterality: N/A;  EXACT MIDURETHRAL SLING   CARDIAC CATHETERIZATION  2009   results normal done in [place]   CHOLECYSTECTOMY  1995    laparoscopic   CYSTOSCOPY N/A 06/14/2020   Procedure: CYSTOSCOPY;  Surgeon: Nunzio Cobbs, MD;  Location: [facility];  Service: Gynecology;  Laterality: N/A;   HAMMER TOE SURGERY Bilateral 2009   HERNIA REPAIR  2015   Has abdominal wall mesh - [place], [place].  Laparoscopic incisional hernia repari with 7 x 9 inch Ventralight ST with Echo mesh   LAPAROSCOPIC HYSTERECTOMY  2007   Supracervical hysterectomy with cystosctopy [place], [place] partial   THYROID SURGERY  2013   total thyroidectomy due to 11 nodules   Patient Active Problem List   Diagnosis Date Noted   Rheumatoid arthritis of multiple sites with negative rheumatoid factor ([place]) 12/22/2021   Status post surgery 06/14/2020   Genetic testing 02/18/2020   Family history of breast cancer    High risk medication use 10/30/2019   Left bundle branch block 04/28/2019   Right wrist pain 03/13/2019   Hamstring strain, left, initial encounter 02/20/2019   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   Primary osteoarthritis of both feet 10/09/2018   Hx of migraines 09/27/2018   History of gastroesophageal reflux (GERD) 09/27/2018   Palindromic rheumatism, 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   Moderate persistent asthma 07/05/2018   Hypothyroid 06/28/2018  PCP: Allwardt, Yetta Flock  REFERRING PROVIDER: Lorenda Peck, DPM   REFERRING DIAG: [phone] (ICD-10-CM) - Peroneal tendonitis, left   THERAPY DIAG:  Pain in left ankle and joints of left foot  - Primary M25.572   Pain in left foot  M79.672   Stiffness of left ankle, not elsewhere classified  M25.672   Muscle weakness (generalized)  M62.81   Difficulty in walking, not elsewhere classified  R26.2     Rationale for Evaluation and Treatment Rehabilitation  ONSET DATE: ~8-10 weeks  SUBJECTIVE:   SUBJECTIVE STATEMENT: Pt has been doing the exercises. Notes that the foot turned inwards exercise is the worst.  PERTINENT HISTORY: Arthritis Asthma Multiple  surgeries -- has pins in bilat toes Endorses prior back and bilat hip pain   PAIN:  Are you having pain? Yes: NPRS scale: 2/10 Pain location: side of ankle Pain description: sore Aggravating factors: Walking, separating toes and pointing toes, worse in the morning Relieving factors: Icing, CBD   PRECAUTIONS: None   PATIENT GOALS  Walk pain free, better mobility   OBJECTIVE:    TODAY'S TREATMENT: 04/06/22  Therex: Nu-step   L5 x 5 min LEs only    Supine    Ankle DF + inv 2x10   Ankle DF + ev 2x10   Toe flexion 2x10   Toe extension 2x10       Sitting    Peroneal nerve glide x10   Arch lifting 2x10   Self stretch into inv + DF X30 sec       Standing    Gastroc stretch 2x30 sec   Soleus stretch 2x30 sec   On blue side of bosu:    Ankle inv 2x10   Trialed ev but increased pain    Ankle PF 2x10     Manual therapy: Skilled assessment and palpation for TPDN. Trigger Point Dry-Needling  Treatment instructions: Expect mild to moderate muscle soreness. S/S of pneumothorax if dry needled over a lung field, and to seek immediate medical attention should they occur. Patient verbalized understanding of these instructions and education.  Patient Consent Given: Yes Education handout provided: No Muscles treated: extensor digitorum brevis and peroneal longus Electrical stimulation performed: No Parameters: N/A Treatment response/outcome: increased length to palpation  Gentle PROM of ankle into DF + Inv to stretch peroneals   PATIENT EDUCATION:  Education details: Exam findings, POC, initial HEP Person educated: Patient Education method: Explanation and Handouts Education comprehension: verbalized understanding   HOME EXERCISE PROGRAM: Access Code: [email or web address] URL: [email or web address] Date: 04/06/2022 Prepared by: Estill Bamberg April Thurnell Garbe  Exercises - Gastroc Stretch on Wall  - 1 x daily - 7 x weekly - 2 sets - 30 sec hold - Soleus Stretch on Wall   - 1 x daily - 7 x weekly - 2 sets - 30 sec hold - Supine Peroneal Nerve Glide (Mirrored)  - 1 x daily - 7 x weekly - 1 sets - 10 reps - CLX Ankle Dorsiflexion and Eversion  - 1 x daily - 7 x weekly - 2 sets - 10 reps - Seated Ankle Inversion Eversion PROM  - 1 x daily - 7 x weekly - 2 sets - 30 sec hold - Seated Arch Lifts  - 1 x daily - 7 x weekly - 2 sets - 10 reps  ASSESSMENT:  CLINICAL IMPRESSION: Session focused on reviewing HEP. Pt's pain is more a feeling of "soreness" today. Progressed pt's foot/ankle exercises as tolerated. Continued peroneal nerve glides and discussed movements to  avoid to keep from peroneal nerve impingement.   OBJECTIVE IMPAIRMENTS decreased activity tolerance, decreased endurance, decreased mobility, difficulty walking, decreased ROM, decreased strength, increased fascial restrictions, increased muscle spasms, impaired sensation, and pain.   ACTIVITY LIMITATIONS lifting, standing, squatting, sleeping, stairs, transfers, and locomotion level  PARTICIPATION LIMITATIONS: shopping, community activity, and yard work  PERSONAL FACTORS Past/current experiences and 1-2 comorbidities: Hip and back pain  are also affecting patient's functional outcome.   REHAB POTENTIAL: Good  CLINICAL DECISION MAKING: Evolving/moderate complexity  EVALUATION COMPLEXITY: Moderate   GOALS: Goals reviewed with patient? Yes  SHORT TERM GOALS: Target date: 04/25/2022   Pt will be ind with initial HEP Baseline: Goal status: INITIAL  2.  Pt will demo L = R ankle ROM for improved mobility on stairs Baseline: See above Goal status: INITIAL  3.  Pt will demo L = R hamstring length for reduced muscle imbalance Baseline:  Goal status: INITIAL   LONG TERM GOALS: Target date: 05/16/2022   Pt will be ind with advanced HEP Baseline:  Goal status: INITIAL  2.  Pt will report >/=50% decrease in pain/burning for improved comfort with normal activities Baseline:  Goal status:  INITIAL  3.  Pt with demo at least 4+/5 L LE strength for improved weightbearing Baseline:  Goal status: INITIAL  4.  Pt will have increased FOTO score to >/=73 Baseline:  Goal status: INITIAL     PLAN: PT FREQUENCY: 2x/week  PT DURATION: 6 weeks  PLANNED INTERVENTIONS: Therapeutic exercises, Therapeutic activity, Neuromuscular re-education, Balance training, Gait training, Patient/Family education, Self Care, Joint mobilization, Stair training, DME instructions, Aquatic Therapy, Dry Needling, Cryotherapy, Moist heat, Taping, Vasopneumatic device, Ultrasound, Ionotophoresis '4mg'$ /ml Dexamethasone, Manual therapy, and Re-evaluation  PLAN FOR NEXT SESSION: Stretch hamstrings. Manual work    [facility] April Ma L Burley, PT, DPT 04/06/2022, 3:56 PM

## 2022-04-07 ENCOUNTER — Telehealth: Payer: Self-pay

## 2022-04-07 NOTE — Telephone Encounter (Signed)
Called pt and lvm and advise to call back to the office. I have sample for her of Veozah and information.

## 2022-04-07 NOTE — Telephone Encounter (Signed)
Patient called back and advised sample and starter pack for Veozah. Patient is coming by the office on Monday to pick up and discuss some details with getting started on Rx.

## 2022-04-11 ENCOUNTER — Ambulatory Visit: Payer: 59 | Admitting: Physical Therapy

## 2022-04-11 DIAGNOSIS — M25672 Stiffness of left ankle, not elsewhere classified: Secondary | ICD-10-CM

## 2022-04-11 DIAGNOSIS — M25572 Pain in left ankle and joints of left foot: Secondary | ICD-10-CM

## 2022-04-11 DIAGNOSIS — M79672 Pain in left foot: Secondary | ICD-10-CM

## 2022-04-11 DIAGNOSIS — M6281 Muscle weakness (generalized): Secondary | ICD-10-CM

## 2022-04-11 DIAGNOSIS — M7672 Peroneal tendinitis, left leg: Secondary | ICD-10-CM | POA: Diagnosis not present

## 2022-04-11 DIAGNOSIS — R262 Difficulty in walking, not elsewhere classified: Secondary | ICD-10-CM

## 2022-04-11 NOTE — Therapy (Signed)
OUTPATIENT PHYSICAL THERAPY TREATMENT   Patient Name: [name] MRN: [id number] DOB:1966-06-09, 56 y.o., female Today's Date: 04/11/2022   PT End of Session - 04/11/22 1018     Visit Number 3    Number of Visits 12    Date for PT Re-Evaluation 05/16/22    Authorization Type UHC    PT Start Time 1019    PT Stop Time 1100    PT Time Calculation (min) 41 min    Activity Tolerance Patient tolerated treatment well    Behavior During Therapy WFL for tasks assessed/performed                Past Medical History:  Diagnosis Date   Arthritis    ra zero negative   Asthma    mild/moderate persistent asthma   Complication of anesthesia    slow to awaken 25 yrs ago   Elevated liver function tests dr Neil Crouch pcp manages   for last year   Family history of breast cancer    Fibroid    Fractured rib    Frequent headaches    GERD (gastroesophageal reflux disease)    Gluten intolerance    H/O seasonal allergies    History of chicken pox    History of COVID-19 07/2019   high fever sob, x 7 days all symptoms resolved   Left bundle branch block 02/2019   Dr.Paula Ross   Migraines    Pneumonia    PONV (postoperative nausea and vomiting)    ponv with several surgeries, likes scopolamine patch   Thyroid disease    Multiple benign nodules--thyroid removed   Past Surgical History:  Procedure Laterality Date   ANTERIOR AND POSTERIOR REPAIR N/A 06/14/2020   Procedure: ANTERIOR (CYSTOCELE) AND POSTERIOR REPAIR (RECTOCELE);  Surgeon: Nunzio Cobbs, MD;  Location: [facility];  Service: Gynecology;  Laterality: N/A;   BLADDER SUSPENSION N/A 06/14/2020   Procedure: TRANSVAGINAL TAPE (TVT) PROCEDURE/EXACT MIDURETHRAL SLING;  Surgeon: Nunzio Cobbs, MD;  Location: [facility];  Service: Gynecology;  Laterality: N/A;  EXACT MIDURETHRAL SLING   CARDIAC CATHETERIZATION  2009   results normal done in [place]   CHOLECYSTECTOMY  1995    laparoscopic   CYSTOSCOPY N/A 06/14/2020   Procedure: CYSTOSCOPY;  Surgeon: Nunzio Cobbs, MD;  Location: [facility];  Service: Gynecology;  Laterality: N/A;   HAMMER TOE SURGERY Bilateral 2009   HERNIA REPAIR  2015   Has abdominal wall mesh - [place], [place].  Laparoscopic incisional hernia repari with 7 x 9 inch Ventralight ST with Echo mesh   LAPAROSCOPIC HYSTERECTOMY  2007   Supracervical hysterectomy with cystosctopy [place], [place] partial   THYROID SURGERY  2013   total thyroidectomy due to 11 nodules   Patient Active Problem List   Diagnosis Date Noted   Rheumatoid arthritis of multiple sites with negative rheumatoid factor ([place]) 12/22/2021   Status post surgery 06/14/2020   Genetic testing 02/18/2020   Family history of breast cancer    High risk medication use 10/30/2019   Left bundle branch block 04/28/2019   Right wrist pain 03/13/2019   Hamstring strain, left, initial encounter 02/20/2019   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   Primary osteoarthritis of both feet 10/09/2018   Hx of migraines 09/27/2018   History of gastroesophageal reflux (GERD) 09/27/2018   Palindromic rheumatism, 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   Moderate persistent asthma 07/05/2018   Hypothyroid 06/28/2018  PCP: Allwardt, Yetta Flock  REFERRING PROVIDER: Lorenda Peck, DPM   REFERRING DIAG: [phone] (ICD-10-CM) - Peroneal tendonitis, left   THERAPY DIAG:  Pain in left ankle and joints of left foot  - Primary M25.572   Pain in left foot  M79.672   Stiffness of left ankle, not elsewhere classified  M25.672   Muscle weakness (generalized)  M62.81   Difficulty in walking, not elsewhere classified  R26.2     Rationale for Evaluation and Treatment Rehabilitation  ONSET DATE: ~8-10 weeks  SUBJECTIVE:   SUBJECTIVE STATEMENT: Pt states that it felt good after the needling. Started to burn again just yesterday.  PERTINENT HISTORY: Arthritis Asthma Multiple  surgeries -- has pins in bilat toes Endorses prior back and bilat hip pain   PAIN:  Are you having pain? Yes: NPRS scale: 3 or 4/10 Pain location: side of ankle Pain description: sore Aggravating factors: Walking, separating toes and pointing toes, worse in the morning Relieving factors: Icing, CBD   PRECAUTIONS: None   PATIENT GOALS  Walk pain free, better mobility   OBJECTIVE:    TODAY'S TREATMENT: 04/11/22 Therex: Nu-step   L5 x 5 min LEs only    Supine    Piriformis stretch x30 sec   Low trunk rotation x10   Bridging 2x10   Sidelying    Clamshell green tband 2x10   Sitting    Hamstring stretch 2x30 sec   Peroneal nerve glide x10   Self stretch into inv + DF 2x30 sec       Standing    Gastroc stretch x30 sec   Soleus stretch 2x30 sec   Single leg heel raise x10   DL heel raise toes in and then out x10            Manual therapy:  Skilled assessment and palpation for TPDN. Trigger Point Dry-Needling  Treatment instructions: Expect mild to moderate muscle soreness. S/S of pneumothorax if dry needled over a lung field, and to seek immediate medical attention should they occur. Patient verbalized understanding of these instructions and education.   Patient Consent Given: Yes Education handout provided: No Muscles treated: extensor digitorum brevis and peroneal longus Electrical stimulation performed: No Parameters: N/A Treatment response/outcome: increased length to palpation K tape to facilitate peroneals with one "I" strip, stabilization of distal fibular head with one "I" strip       04/06/22 Therex: Nu-step   L5 x 5 min LEs only    Supine    Ankle DF + inv 2x10   Ankle DF + ev 2x10   Toe flexion 2x10   Toe extension 2x10       Sitting    Peroneal nerve glide x10   Arch lifting 2x10   Self stretch into inv + DF X30 sec       Standing    Gastroc stretch 2x30 sec   Soleus stretch 2x30 sec   On blue side of bosu:    Ankle inv 2x10   Trialed  ev but increased pain    Ankle PF 2x10     Manual therapy: Skilled assessment and palpation for TPDN. Trigger Point Dry-Needling  Treatment instructions: Expect mild to moderate muscle soreness. S/S of pneumothorax if dry needled over a lung field, and to seek immediate medical attention should they occur. Patient verbalized understanding of these instructions and education.  Patient Consent Given: Yes Education handout provided: No Muscles treated: extensor digitorum brevis and peroneal longus Electrical stimulation performed: No Parameters: N/A Treatment  response/outcome: increased length to palpation  Gentle PROM of ankle into DF + Inv to stretch peroneals   PATIENT EDUCATION:  Education details: Exam findings, POC, initial HEP Person educated: Patient Education method: Explanation and Handouts Education comprehension: verbalized understanding   HOME EXERCISE PROGRAM: Access Code: [email or web address] URL: [email or web address] Date: 04/06/2022 Prepared by: Estill Bamberg April Thurnell Garbe  Exercises - Gastroc Stretch on Wall  - 1 x daily - 7 x weekly - 2 sets - 30 sec hold - Soleus Stretch on Wall  - 1 x daily - 7 x weekly - 2 sets - 30 sec hold - Supine Peroneal Nerve Glide (Mirrored)  - 1 x daily - 7 x weekly - 1 sets - 10 reps - CLX Ankle Dorsiflexion and Eversion  - 1 x daily - 7 x weekly - 2 sets - 10 reps - Seated Ankle Inversion Eversion PROM  - 1 x daily - 7 x weekly - 2 sets - 30 sec hold - Seated Arch Lifts  - 1 x daily - 7 x weekly - 2 sets - 10 reps  ASSESSMENT:  CLINICAL IMPRESSION: Pt with good response to TPDN -- able to get relief of burning for 4 days. Provided another round of TPDN and added k-tape to provide further stability along fibula. Pt found to have hypermobile distal fibular head with PA mobs. Initiated hip strengthening this session.   OBJECTIVE IMPAIRMENTS decreased activity tolerance, decreased endurance, decreased mobility, difficulty  walking, decreased ROM, decreased strength, increased fascial restrictions, increased muscle spasms, impaired sensation, and pain.   ACTIVITY LIMITATIONS lifting, standing, squatting, sleeping, stairs, transfers, and locomotion level  PARTICIPATION LIMITATIONS: shopping, community activity, and yard work  PERSONAL FACTORS Past/current experiences and 1-2 comorbidities: Hip and back pain  are also affecting patient's functional outcome.   REHAB POTENTIAL: Good  CLINICAL DECISION MAKING: Evolving/moderate complexity  EVALUATION COMPLEXITY: Moderate   GOALS: Goals reviewed with patient? Yes  SHORT TERM GOALS: Target date: 04/25/2022   Pt will be ind with initial HEP Baseline: Goal status: INITIAL  2.  Pt will demo L = R ankle ROM for improved mobility on stairs Baseline: See above Goal status: INITIAL  3.  Pt will demo L = R hamstring length for reduced muscle imbalance Baseline:  Goal status: INITIAL   LONG TERM GOALS: Target date: 05/16/2022   Pt will be ind with advanced HEP Baseline:  Goal status: INITIAL  2.  Pt will report >/=50% decrease in pain/burning for improved comfort with normal activities Baseline:  Goal status: INITIAL  3.  Pt with demo at least 4+/5 L LE strength for improved weightbearing Baseline:  Goal status: INITIAL  4.  Pt will have increased FOTO score to >/=73 Baseline:  Goal status: INITIAL     PLAN: PT FREQUENCY: 2x/week  PT DURATION: 6 weeks  PLANNED INTERVENTIONS: Therapeutic exercises, Therapeutic activity, Neuromuscular re-education, Balance training, Gait training, Patient/Family education, Self Care, Joint mobilization, Stair training, DME instructions, Aquatic Therapy, Dry Needling, Cryotherapy, Moist heat, Taping, Vasopneumatic device, Ultrasound, Ionotophoresis '4mg'$ /ml Dexamethasone, Manual therapy, and Re-evaluation  PLAN FOR NEXT SESSION: Stretch hamstrings. Manual work    [facility] April Ma L [place], PT, DPT 04/11/2022,  10:19 AM

## 2022-04-12 NOTE — Therapy (Signed)
OUTPATIENT PHYSICAL THERAPY TREATMENT   Patient Name: [name] MRN: [id number] DOB:1966/01/31, 56 y.o., female Today's Date: 04/13/2022   PT End of Session - 04/13/22 1019     Visit Number 4    Number of Visits 12    Date for PT Re-Evaluation 05/16/22    Authorization Type UHC    PT Start Time 1019    PT Stop Time 1102    PT Time Calculation (min) 43 min    Activity Tolerance Patient tolerated treatment well    Behavior During Therapy WFL for tasks assessed/performed                 Past Medical History:  Diagnosis Date   Arthritis    ra zero negative   Asthma    mild/moderate persistent asthma   Complication of anesthesia    slow to awaken 25 yrs ago   Elevated liver function tests dr Neil Crouch pcp manages   for last year   Family history of breast cancer    Fibroid    Fractured rib    Frequent headaches    GERD (gastroesophageal reflux disease)    Gluten intolerance    H/O seasonal allergies    History of chicken pox    History of COVID-19 07/2019   high fever sob, x 7 days all symptoms resolved   Left bundle branch block 02/2019   Dr.Paula Ross   Migraines    Pneumonia    PONV (postoperative nausea and vomiting)    ponv with several surgeries, likes scopolamine patch   Thyroid disease    Multiple benign nodules--thyroid removed   Past Surgical History:  Procedure Laterality Date   ANTERIOR AND POSTERIOR REPAIR N/A 06/14/2020   Procedure: ANTERIOR (CYSTOCELE) AND POSTERIOR REPAIR (RECTOCELE);  Surgeon: Nunzio Cobbs, MD;  Location: [facility];  Service: Gynecology;  Laterality: N/A;   BLADDER SUSPENSION N/A 06/14/2020   Procedure: TRANSVAGINAL TAPE (TVT) PROCEDURE/EXACT MIDURETHRAL SLING;  Surgeon: Nunzio Cobbs, MD;  Location: [facility];  Service: Gynecology;  Laterality: N/A;  EXACT MIDURETHRAL SLING   CARDIAC CATHETERIZATION  2009   results normal done in [place]   CHOLECYSTECTOMY   1995   laparoscopic   CYSTOSCOPY N/A 06/14/2020   Procedure: CYSTOSCOPY;  Surgeon: Nunzio Cobbs, MD;  Location: [facility];  Service: Gynecology;  Laterality: N/A;   HAMMER TOE SURGERY Bilateral 2009   HERNIA REPAIR  2015   Has abdominal wall mesh - [place], [place].  Laparoscopic incisional hernia repari with 7 x 9 inch Ventralight ST with Echo mesh   LAPAROSCOPIC HYSTERECTOMY  2007   Supracervical hysterectomy with cystosctopy [place], [place] partial   THYROID SURGERY  2013   total thyroidectomy due to 11 nodules   Patient Active Problem List   Diagnosis Date Noted   Rheumatoid arthritis of multiple sites with negative rheumatoid factor ([place]) 12/22/2021   Status post surgery 06/14/2020   Genetic testing 02/18/2020   Family history of breast cancer    High risk medication use 10/30/2019   Left bundle branch block 04/28/2019   Right wrist pain 03/13/2019   Hamstring strain, left, initial encounter 02/20/2019   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   Primary osteoarthritis of both feet 10/09/2018   Hx of migraines 09/27/2018   History of gastroesophageal reflux (GERD) 09/27/2018   Palindromic rheumatism, 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   Moderate persistent asthma 07/05/2018   Hypothyroid 06/28/2018  PCP: Allwardt, Yetta Flock  REFERRING PROVIDER: Lorenda Peck, DPM   REFERRING DIAG: [phone] (ICD-10-CM) - Peroneal tendonitis, left   THERAPY DIAG:  Pain in left ankle and joints of left foot  - Primary M25.572   Pain in left foot  M79.672   Stiffness of left ankle, not elsewhere classified  M25.672   Muscle weakness (generalized)  M62.81   Difficulty in walking, not elsewhere classified  R26.2     Rationale for Evaluation and Treatment Rehabilitation  ONSET DATE: ~8-10 weeks  SUBJECTIVE:   SUBJECTIVE STATEMENT: A little sore today.  PERTINENT HISTORY: Arthritis Asthma Multiple surgeries -- has pins in bilat toes Endorses prior back and bilat  hip pain   PAIN:  Are you having pain? Yes: NPRS scale: 2/10 Pain location: side of ankle Pain description: sore Aggravating factors: Walking, separating toes and pointing toes, worse in the morning Relieving factors: Icing, CBD   PRECAUTIONS: None   PATIENT GOALS  Walk pain free, better mobility   OBJECTIVE:    TODAY'S TREATMENT: 04/12/22 L5 x 5 min with LE's only Gastroc stretch 2x 30 sec Soleus stretch 2x 30 sec SL heel raise leaning right side into wall x 10 DL heel raise toes in and out x 10 ea  Seated: HS stretch 2x30 sec Peroneal nerve glide 5 sec x 10  Stretch into inv and PF  2x 30 sec  SDLY: GTB clams 2 x 10 bil  Manual: IASTM  to left peroneals and extensor digitorum brevis  K tape to facilitate peroneals with  two side by side "I" strip, stabilization of distal fibular head    04/11/22 Therex: Nu-step   L5 x 5 min LEs only    Supine    Piriformis stretch x30 sec   Low trunk rotation x10   Bridging 2x10   Sidelying    Clamshell green tband 2x10   Sitting    Hamstring stretch 2x30 sec   Peroneal nerve glide x10   Self stretch into inv + DF 2x30 sec       Standing    Gastroc stretch x30 sec   Soleus stretch 2x30 sec   Single leg heel raise x10   DL heel raise toes in and then out x10            Manual therapy:  Skilled assessment and palpation for TPDN. Trigger Point Dry-Needling  Treatment instructions: Expect mild to moderate muscle soreness. S/S of pneumothorax if dry needled over a lung field, and to seek immediate medical attention should they occur. Patient verbalized understanding of these instructions and education.   Patient Consent Given: Yes Education handout provided: No Muscles treated: extensor digitorum brevis and peroneal longus Electrical stimulation performed: No Parameters: N/A Treatment response/outcome: increased length to palpation K tape to facilitate peroneals with one "I" strip, stabilization of distal fibular  head with one "I" strip       04/06/22 Therex: Nu-step   L5 x 5 min LEs only    Supine    Ankle DF + inv 2x10   Ankle DF + ev 2x10   Toe flexion 2x10   Toe extension 2x10       Sitting    Peroneal nerve glide x10   Arch lifting 2x10   Self stretch into inv + DF X30 sec       Standing    Gastroc stretch 2x30 sec   Soleus stretch 2x30 sec   On blue side of bosu:    Ankle inv  2x10   Trialed ev but increased pain    Ankle PF 2x10     Manual therapy: Skilled assessment and palpation for TPDN. Trigger Point Dry-Needling  Treatment instructions: Expect mild to moderate muscle soreness. S/S of pneumothorax if dry needled over a lung field, and to seek immediate medical attention should they occur. Patient verbalized understanding of these instructions and education.  Patient Consent Given: Yes Education handout provided: No Muscles treated: extensor digitorum brevis and peroneal longus Electrical stimulation performed: No Parameters: N/A Treatment response/outcome: increased length to palpation  Gentle PROM of ankle into DF + Inv to stretch peroneals   PATIENT EDUCATION:  Education details: Exam findings, POC, initial HEP Person educated: Patient Education method: Explanation and Handouts Education comprehension: verbalized understanding   HOME EXERCISE PROGRAM: Access Code: [email or web address] URL: [email or web address] Date: 04/06/2022 Prepared by: Estill Bamberg April Thurnell Garbe  Exercises - Gastroc Stretch on Wall  - 1 x daily - 7 x weekly - 2 sets - 30 sec hold - Soleus Stretch on Wall  - 1 x daily - 7 x weekly - 2 sets - 30 sec hold - Supine Peroneal Nerve Glide (Mirrored)  - 1 x daily - 7 x weekly - 1 sets - 10 reps - CLX Ankle Dorsiflexion and Eversion  - 1 x daily - 7 x weekly - 2 sets - 10 reps - Seated Ankle Inversion Eversion PROM  - 1 x daily - 7 x weekly - 2 sets - 30 sec hold - Seated Arch Lifts  - 1 x daily - 7 x weekly - 2 sets - 10  reps  ASSESSMENT:  CLINICAL IMPRESSION: Good response to tape and DN from last session. Still very tight in peroneals. Deep IASTM decreased tissue tension of peroneals. Reapplied tape per pt request as initial tape did not last long due to heat.    OBJECTIVE IMPAIRMENTS decreased activity tolerance, decreased endurance, decreased mobility, difficulty walking, decreased ROM, decreased strength, increased fascial restrictions, increased muscle spasms, impaired sensation, and pain.   ACTIVITY LIMITATIONS lifting, standing, squatting, sleeping, stairs, transfers, and locomotion level  PARTICIPATION LIMITATIONS: shopping, community activity, and yard work  PERSONAL FACTORS Past/current experiences and 1-2 comorbidities: Hip and back pain  are also affecting patient's functional outcome.   REHAB POTENTIAL: Good  CLINICAL DECISION MAKING: Evolving/moderate complexity  EVALUATION COMPLEXITY: Moderate   GOALS: Goals reviewed with patient? Yes  SHORT TERM GOALS: Target date: 04/25/2022   Pt will be ind with initial HEP Baseline: Goal status: INITIAL  2.  Pt will demo L = R ankle ROM for improved mobility on stairs Baseline: See above Goal status: INITIAL  3.  Pt will demo L = R hamstring length for reduced muscle imbalance Baseline:  Goal status: INITIAL   LONG TERM GOALS: Target date: 05/16/2022   Pt will be ind with advanced HEP Baseline:  Goal status: INITIAL  2.  Pt will report >/=50% decrease in pain/burning for improved comfort with normal activities Baseline:  Goal status: INITIAL  3.  Pt with demo at least 4+/5 L LE strength for improved weightbearing Baseline:  Goal status: INITIAL  4.  Pt will have increased FOTO score to >/=73 Baseline:  Goal status: INITIAL     PLAN: PT FREQUENCY: 2x/week  PT DURATION: 6 weeks  PLANNED INTERVENTIONS: Therapeutic exercises, Therapeutic activity, Neuromuscular re-education, Balance training, Gait training,  Patient/Family education, Self Care, Joint mobilization, Stair training, DME instructions, Aquatic Therapy, Dry Needling, Cryotherapy, Moist heat, Taping, Vasopneumatic device, Ultrasound, Ionotophoresis '4mg'$ /ml  Dexamethasone, Manual therapy, and Re-evaluation  PLAN FOR NEXT SESSION: Assess tape, continue Manual work    Adylin Hankey, PT, 04/13/2022, 11:10 AM

## 2022-04-13 ENCOUNTER — Encounter: Payer: Self-pay | Admitting: Physical Therapy

## 2022-04-13 ENCOUNTER — Ambulatory Visit: Payer: 59 | Admitting: Physical Therapy

## 2022-04-13 DIAGNOSIS — M6281 Muscle weakness (generalized): Secondary | ICD-10-CM

## 2022-04-13 DIAGNOSIS — M25672 Stiffness of left ankle, not elsewhere classified: Secondary | ICD-10-CM

## 2022-04-13 DIAGNOSIS — M7672 Peroneal tendinitis, left leg: Secondary | ICD-10-CM | POA: Diagnosis not present

## 2022-04-13 DIAGNOSIS — M79672 Pain in left foot: Secondary | ICD-10-CM

## 2022-04-13 DIAGNOSIS — M25572 Pain in left ankle and joints of left foot: Secondary | ICD-10-CM

## 2022-04-13 DIAGNOSIS — R262 Difficulty in walking, not elsewhere classified: Secondary | ICD-10-CM

## 2022-04-18 ENCOUNTER — Ambulatory Visit: Payer: 59 | Admitting: Physical Therapy

## 2022-04-18 ENCOUNTER — Other Ambulatory Visit: Payer: Self-pay | Admitting: Physician Assistant

## 2022-04-19 ENCOUNTER — Ambulatory Visit: Payer: 59 | Attending: Rheumatology | Admitting: Rheumatology

## 2022-04-19 ENCOUNTER — Encounter: Payer: Self-pay | Admitting: Rheumatology

## 2022-04-19 VITALS — BP 117/75 | HR 94 | Resp 14 | Ht 67.0 in | Wt 195.2 lb

## 2022-04-19 DIAGNOSIS — M79641 Pain in right hand: Secondary | ICD-10-CM | POA: Diagnosis not present

## 2022-04-19 DIAGNOSIS — M19072 Primary osteoarthritis, left ankle and foot: Secondary | ICD-10-CM

## 2022-04-19 DIAGNOSIS — G8929 Other chronic pain: Secondary | ICD-10-CM

## 2022-04-19 DIAGNOSIS — M0609 Rheumatoid arthritis without rheumatoid factor, multiple sites: Secondary | ICD-10-CM | POA: Diagnosis not present

## 2022-04-19 DIAGNOSIS — M79642 Pain in left hand: Secondary | ICD-10-CM

## 2022-04-19 DIAGNOSIS — M7711 Lateral epicondylitis, right elbow: Secondary | ICD-10-CM

## 2022-04-19 DIAGNOSIS — M19071 Primary osteoarthritis, right ankle and foot: Secondary | ICD-10-CM

## 2022-04-19 DIAGNOSIS — Z780 Asymptomatic menopausal state: Secondary | ICD-10-CM

## 2022-04-19 DIAGNOSIS — M79672 Pain in left foot: Secondary | ICD-10-CM

## 2022-04-19 DIAGNOSIS — Z8709 Personal history of other diseases of the respiratory system: Secondary | ICD-10-CM

## 2022-04-19 DIAGNOSIS — Z8719 Personal history of other diseases of the digestive system: Secondary | ICD-10-CM

## 2022-04-19 DIAGNOSIS — Z79899 Other long term (current) drug therapy: Secondary | ICD-10-CM

## 2022-04-19 DIAGNOSIS — Z8639 Personal history of other endocrine, nutritional and metabolic disease: Secondary | ICD-10-CM

## 2022-04-19 DIAGNOSIS — L659 Nonscarring hair loss, unspecified: Secondary | ICD-10-CM

## 2022-04-19 DIAGNOSIS — M545 Low back pain, unspecified: Secondary | ICD-10-CM

## 2022-04-19 DIAGNOSIS — M7061 Trochanteric bursitis, right hip: Secondary | ICD-10-CM

## 2022-04-19 DIAGNOSIS — L858 Other specified epidermal thickening: Secondary | ICD-10-CM

## 2022-04-19 DIAGNOSIS — Z8669 Personal history of other diseases of the nervous system and sense organs: Secondary | ICD-10-CM

## 2022-04-19 DIAGNOSIS — R238 Other skin changes: Secondary | ICD-10-CM

## 2022-04-19 DIAGNOSIS — M7062 Trochanteric bursitis, left hip: Secondary | ICD-10-CM

## 2022-04-19 DIAGNOSIS — R931 Abnormal findings on diagnostic imaging of heart and coronary circulation: Secondary | ICD-10-CM

## 2022-04-19 DIAGNOSIS — R232 Flushing: Secondary | ICD-10-CM

## 2022-04-19 NOTE — Patient Instructions (Signed)
Standing Labs We placed an order today for your standing lab work.   Please have your standing labs drawn in November and every 3 months  If possible, please have your labs drawn 2 weeks prior to your appointment so that the provider can discuss your results at your appointment.  Please note that you may see your imaging and lab results in MyChart before we have reviewed them. We may be awaiting multiple results to interpret others before contacting you. Please allow our office up to 72 hours to thoroughly review all of the results before contacting the office for clarification of your results.  We have open lab daily: Monday through Thursday from 1:30-4:30 PM and Friday from 1:30-4:00 PM at the office of Dr. Elianah Karis, [facility].   Please be advised, all patients with office appointments requiring lab work will take precedent over walk-in lab work.  If possible, please come for your lab work on Monday and Friday afternoons, as you may experience shorter wait times. The office is located at [address], [address], [place], [place] [address] No appointment is necessary.   Labs are drawn by [facility]. Please bring your co-pay at the time of your lab draw.  You may receive a bill from [facility] for your lab work.  Please note if you are on Hydroxychloroquine and and an order has been placed for a Hydroxychloroquine level, you will need to have it drawn 4 hours or more after your last dose.  If you wish to have your labs drawn at another location, please call the office 24 hours in advance to send orders.  If you have any questions regarding directions or hours of operation,  please call [phone].   As a reminder, please drink plenty of water prior to coming for your lab work. Thanks!   Vaccines You are taking a medication(s) that can suppress your immune system.  The following immunizations are recommended: Flu annually Covid-19  Td/Tdap (tetanus, diphtheria,  pertussis) every 10 years Pneumonia (Prevnar 15 then Pneumovax 23 at least 1 year apart.  Alternatively, can take Prevnar 20 without needing additional dose) Shingrix: 2 doses from 4 weeks to 6 months apart  Please check with your PCP to make sure you are up to date.   If you have signs or symptoms of an infection or start antibiotics: First, call your PCP for workup of your infection. Hold your medication through the infection, until you complete your antibiotics, and until symptoms resolve if you take the following: Injectable medication (Actemra, Benlysta, Cimzia, Cosentyx, Enbrel, Humira, Kevzara, Orencia, Remicade, Simponi, Stelara, Taltz, Tremfya) Methotrexate Leflunomide (Arava) Mycophenolate (Cellcept) Xeljanz, Olumiant, or Rinvoq  

## 2022-04-20 ENCOUNTER — Ambulatory Visit: Payer: 59 | Attending: Podiatry | Admitting: Physical Therapy

## 2022-04-20 ENCOUNTER — Encounter: Payer: Self-pay | Admitting: Physical Therapy

## 2022-04-20 DIAGNOSIS — M79672 Pain in left foot: Secondary | ICD-10-CM | POA: Insufficient documentation

## 2022-04-20 DIAGNOSIS — R262 Difficulty in walking, not elsewhere classified: Secondary | ICD-10-CM | POA: Diagnosis present

## 2022-04-20 DIAGNOSIS — M25672 Stiffness of left ankle, not elsewhere classified: Secondary | ICD-10-CM | POA: Diagnosis present

## 2022-04-20 DIAGNOSIS — M6281 Muscle weakness (generalized): Secondary | ICD-10-CM | POA: Diagnosis present

## 2022-04-20 DIAGNOSIS — M25572 Pain in left ankle and joints of left foot: Secondary | ICD-10-CM | POA: Insufficient documentation

## 2022-04-20 LAB — COMPLETE METABOLIC PANEL WITH GFR
AG Ratio: 1.8 (calc) (ref 1.0–2.5)
ALT: 25 U/L (ref 6–29)
AST: 29 U/L (ref 10–35)
Albumin: 4.5 g/dL (ref 3.6–5.1)
Alkaline phosphatase (APISO): 70 U/L (ref 37–153)
BUN: 13 mg/dL (ref 7–25)
CO2: 30 mmol/L (ref 20–32)
Calcium: 10.2 mg/dL (ref 8.6–10.4)
Chloride: 104 mmol/L (ref 98–110)
Creat: 0.81 mg/dL (ref 0.50–1.03)
Globulin: 2.5 g/dL (calc) (ref 1.9–3.7)
Glucose, Bld: 88 mg/dL (ref 65–99)
Potassium: 4.6 mmol/L (ref 3.5–5.3)
Sodium: 141 mmol/L (ref 135–146)
Total Bilirubin: 0.7 mg/dL (ref 0.2–1.2)
Total Protein: 7 g/dL (ref 6.1–8.1)
eGFR: 87 mL/min/{1.73_m2} (ref >=60)

## 2022-04-20 LAB — CBC WITH DIFFERENTIAL/PLATELET
Absolute Monocytes: 398 cells/uL (ref 200–950)
Basophils Absolute: 67 cells/uL (ref 0–200)
Basophils Relative: 1.2 %
Eosinophils Absolute: 179 cells/uL (ref 15–500)
Eosinophils Relative: 3.2 %
HCT: 43.3 % (ref 35.0–45.0)
Hemoglobin: 14 g/dL (ref 11.7–15.5)
Lymphs Abs: 1534 cells/uL (ref 850–3900)
MCH: 26.8 pg — ABNORMAL LOW (ref 27.0–33.0)
MCHC: 32.3 g/dL (ref 32.0–36.0)
MCV: 82.8 fL (ref 80.0–100.0)
MPV: 11.3 fL (ref 7.5–12.5)
Monocytes Relative: 7.1 %
Neutro Abs: 3422 cells/uL (ref 1500–7800)
Neutrophils Relative %: 61.1 %
Platelets: 315 10*3/uL (ref 140–400)
RBC: 5.23 10*6/uL — ABNORMAL HIGH (ref 3.80–5.10)
RDW: 13.7 % (ref 11.0–15.0)
Total Lymphocyte: 27.4 %
WBC: 5.6 10*3/uL (ref 3.8–10.8)

## 2022-04-20 NOTE — Progress Notes (Signed)
CBC and CMP are normal.

## 2022-04-20 NOTE — Therapy (Signed)
OUTPATIENT PHYSICAL THERAPY TREATMENT   Patient Name: [name] MRN: [id number] DOB:[birth year], 56 y.o., female Today's Date: 04/20/2022   PT End of Session - 04/20/22 1019     Visit Number 5    Number of Visits 12    Date for PT Re-Evaluation 05/16/22    Authorization Type UHC    PT Start Time 1019    PT Stop Time 1100    PT Time Calculation (min) 41 min    Activity Tolerance Patient tolerated treatment well    Behavior During Therapy WFL for tasks assessed/performed                 Past Medical History:  Diagnosis Date   Arthritis    ra zero negative   Asthma    mild/moderate persistent asthma   Complication of anesthesia    slow to awaken 25 yrs ago   Elevated liver function tests dr Neil Crouch pcp manages   for last year   Family history of breast cancer    Fibroid    Fractured rib    Frequent headaches    GERD (gastroesophageal reflux disease)    Gluten intolerance    H/O seasonal allergies    History of chicken pox    History of COVID-19 07/2019   high fever sob, x 7 days all symptoms resolved   Injury of peroneal tendon of left foot, initial encounter    Left bundle branch block 02/2019   Dr.Paula Ross   Migraines    Pneumonia    PONV (postoperative nausea and vomiting)    ponv with several surgeries, likes scopolamine patch   Thyroid disease    Multiple benign nodules--thyroid removed   Past Surgical History:  Procedure Laterality Date   ANTERIOR AND POSTERIOR REPAIR N/A 06/14/2020   Procedure: ANTERIOR (CYSTOCELE) AND POSTERIOR REPAIR (RECTOCELE);  Surgeon: Nunzio Cobbs, MD;  Location: [facility];  Service: Gynecology;  Laterality: N/A;   BLADDER SUSPENSION N/A 06/14/2020   Procedure: TRANSVAGINAL TAPE (TVT) PROCEDURE/EXACT MIDURETHRAL SLING;  Surgeon: Nunzio Cobbs, MD;  Location: [facility];  Service: Gynecology;  Laterality: N/A;  EXACT MIDURETHRAL SLING   CARDIAC CATHETERIZATION   2009   results normal done in [place]   CHOLECYSTECTOMY  1995   laparoscopic   CYSTOSCOPY N/A 06/14/2020   Procedure: CYSTOSCOPY;  Surgeon: Nunzio Cobbs, MD;  Location: [facility];  Service: Gynecology;  Laterality: N/A;   HAMMER TOE SURGERY Bilateral 2009   HERNIA REPAIR  2015   Has abdominal wall mesh - [place], [place].  Laparoscopic incisional hernia repari with 7 x 9 inch Ventralight ST with Echo mesh   LAPAROSCOPIC HYSTERECTOMY  2007   Supracervical hysterectomy with cystosctopy [place], [place] partial   THYROID SURGERY  2013   total thyroidectomy due to 11 nodules   Patient Active Problem List   Diagnosis Date Noted   Rheumatoid arthritis of multiple sites with negative rheumatoid factor ([place]) 12/22/2021   Status post surgery 06/14/2020   Genetic testing 02/18/2020   Family history of breast cancer    High risk medication use 10/30/2019   Left bundle branch block 04/28/2019   Right wrist pain 03/13/2019   Hamstring strain, left, initial encounter 02/20/2019   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   Primary osteoarthritis of both feet 10/09/2018   Hx of migraines 09/27/2018   History of gastroesophageal reflux (GERD) 09/27/2018   Palindromic rheumatism, multiple sites  08/08/2018   Moderate persistent asthma 07/05/2018   Hypothyroid 06/28/2018    PCP: Theresa Duty  REFERRING PROVIDER: Lorenda Peck, DPM   REFERRING DIAG: [phone] (ICD-10-CM) - Peroneal tendonitis, left   THERAPY DIAG:  Pain in left ankle and joints of left foot  - Primary M25.572   Pain in left foot  M79.672   Stiffness of left ankle, not elsewhere classified  M25.672   Muscle weakness (generalized)  M62.81   Difficulty in walking, not elsewhere classified  R26.2     Rationale for Evaluation and Treatment Rehabilitation  ONSET DATE: ~8-10 weeks  SUBJECTIVE:   SUBJECTIVE STATEMENT: Pt reports she was sore for a few days after IASTM. Feels sore but not sure if  it's the weather impacting it.  PERTINENT HISTORY: Arthritis Asthma Multiple surgeries -- has pins in bilat toes Endorses prior back and bilat hip pain   PAIN:  Are you having pain? Yes: NPRS scale: 3/10 Pain location: side of ankle Pain description: sore Aggravating factors: Walking, separating toes and pointing toes, worse in the morning Relieving factors: Icing, CBD   PRECAUTIONS: None   PATIENT GOALS  Walk pain free, better mobility   OBJECTIVE:    TODAY'S TREATMENT: 04/20/22 L2 x 5 min recumbent bike Standing:  Gastroc stretch 2x30 sec  Soleus stretch 2x30 sec  Peroneal stretch standing 2x30 sec  SL heel raise 2x10  DL Toe raises 2x10  On blue side of bosu:   Single leg standing x30 sec   Ankle inv x10   Ankle ev x10   Seated:  Figure 4 stretch x30 sec L&R  Peroneal nerve glide x10 L  HS stretch x30 sec L  Sidelying:  Green tband clam 2x10  Supine:  Piriformis stretch x30 sec L&R  Manual: STM  to left peroneals and extensor digitorum brevis  K tape to facilitate peroneals with  two side by side "I" strip, stabilization of distal fibular head    04/12/22 L5 x 5 min with LE's only Gastroc stretch 2x 30 sec Soleus stretch 2x 30 sec SL heel raise leaning right side into wall x 10 DL heel raise toes in and out x 10 ea  Seated: HS stretch 2x30 sec Peroneal nerve glide 5 sec x 10  Stretch into inv and PF  2x 30 sec  SDLY: GTB clams 2 x 10 bil  Manual: IASTM  to left peroneals and extensor digitorum brevis  K tape to facilitate peroneals with  two side by side "I" strip, stabilization of distal fibular head       PATIENT EDUCATION:  Education details: Exam findings, POC, initial HEP Person educated: Patient Education method: Explanation and Handouts Education comprehension: verbalized understanding   HOME EXERCISE PROGRAM: Access Code: [email or web address] URL: [email or web address] Date: 04/06/2022 Prepared by: Estill Bamberg April Thurnell Garbe  Exercises - Gastroc Stretch on Wall  - 1 x daily - 7 x weekly - 2 sets - 30 sec hold - Soleus Stretch on Wall  - 1 x daily - 7 x weekly - 2 sets - 30 sec hold - Supine Peroneal Nerve Glide (Mirrored)  - 1 x daily - 7 x weekly - 1 sets - 10 reps - CLX Ankle Dorsiflexion and Eversion  - 1 x daily - 7 x weekly - 2 sets - 10 reps - Seated Ankle Inversion Eversion PROM  - 1 x daily - 7 x weekly - 2 sets - 30 sec hold - Seated Arch Lifts  - 1 x daily -  7 x weekly - 2 sets - 10 reps  ASSESSMENT:  CLINICAL IMPRESSION: Continued taping this session. 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 Able to tolerate standing eversion this session without pain. Improved extensor digitorum puffiness.     OBJECTIVE IMPAIRMENTS decreased activity tolerance, decreased endurance, decreased mobility, difficulty walking, decreased ROM, decreased strength, increased fascial restrictions, increased muscle spasms, impaired sensation, and pain.   ACTIVITY LIMITATIONS lifting, standing, squatting, sleeping, stairs, transfers, and locomotion level  PARTICIPATION LIMITATIONS: shopping, community activity, and yard work  PERSONAL FACTORS Past/current experiences and 1-2 comorbidities: Hip and back pain  are also affecting patient's functional outcome.   REHAB POTENTIAL: Good  CLINICAL DECISION MAKING: Evolving/moderate complexity  EVALUATION COMPLEXITY: Moderate   GOALS: Goals reviewed with patient? Yes  SHORT TERM GOALS: Target date: 04/25/2022   Pt will be ind with initial HEP Baseline: Goal status: INITIAL  2.  Pt will demo L = R ankle ROM for improved mobility on stairs Baseline: See above Goal status: INITIAL  3.  Pt will demo L = R hamstring length for reduced muscle imbalance Baseline:  Goal status: INITIAL   LONG TERM GOALS: Target date: 05/16/2022   Pt will be ind with advanced HEP Baseline:  Goal status: INITIAL  2.  Pt will report >/=50% decrease in pain/burning for improved  comfort with normal activities Baseline:  Goal status: INITIAL  3.  Pt with demo at least 4+/5 L LE strength for improved weightbearing Baseline:  Goal status: INITIAL  4.  Pt will have increased FOTO score to >/=73 Baseline:  Goal status: INITIAL     PLAN: PT FREQUENCY: 2x/week  PT DURATION: 6 weeks  PLANNED INTERVENTIONS: Therapeutic exercises, Therapeutic activity, Neuromuscular re-education, Balance training, Gait training, Patient/Family education, Self Care, Joint mobilization, Stair training, DME instructions, Aquatic Therapy, Dry Needling, Cryotherapy, Moist heat, Taping, Vasopneumatic device, Ultrasound, Ionotophoresis '4mg'$ /ml Dexamethasone, Manual therapy, and Re-evaluation  PLAN FOR NEXT SESSION: Assess tape, continue Manual work    Manon [place] April [place], PT,DPT 04/20/2022, 10:21 AM

## 2022-04-24 ENCOUNTER — Encounter: Payer: Self-pay | Admitting: Physical Therapy

## 2022-04-24 ENCOUNTER — Ambulatory Visit: Payer: 59 | Admitting: Physical Therapy

## 2022-04-24 DIAGNOSIS — M6281 Muscle weakness (generalized): Secondary | ICD-10-CM

## 2022-04-24 DIAGNOSIS — M25672 Stiffness of left ankle, not elsewhere classified: Secondary | ICD-10-CM

## 2022-04-24 DIAGNOSIS — R262 Difficulty in walking, not elsewhere classified: Secondary | ICD-10-CM

## 2022-04-24 DIAGNOSIS — M25572 Pain in left ankle and joints of left foot: Secondary | ICD-10-CM | POA: Diagnosis not present

## 2022-04-24 DIAGNOSIS — M79672 Pain in left foot: Secondary | ICD-10-CM

## 2022-04-24 NOTE — Therapy (Addendum)
OUTPATIENT PHYSICAL THERAPY TREATMENT AND DISCHARGE   Patient Name: [name] MRN: [id number] DOB:Dec 20, 1965, 56 y.o., female Today's Date: 04/24/2022  PHYSICAL THERAPY DISCHARGE SUMMARY  Visits from Start of Care: 6  Current functional level related to goals / functional outcomes: See below   Remaining deficits: See below   Education / Equipment: See below   Patient agrees to discharge. Patient goals were partially met. Patient is being discharged due to not returning since the last visit.    PT End of Session - 04/24/22 1029     Visit Number 6    Number of Visits 12    Date for PT Re-Evaluation 05/16/22    Authorization Type [employer]    PT Start Time 1030   late arrival   PT Stop Time 1100    PT Time Calculation (min) 30 min    Activity Tolerance Patient tolerated treatment well    Behavior During Therapy WFL for tasks assessed/performed                 Past Medical History:  Diagnosis Date   Arthritis    ra zero negative   Asthma    mild/moderate persistent asthma   Complication of anesthesia    slow to awaken 25 yrs ago   Elevated liver function tests dr Neil Crouch pcp manages   for last year   Family history of breast cancer    Fibroid    Fractured rib    Frequent headaches    GERD (gastroesophageal reflux disease)    Gluten intolerance    H/O seasonal allergies    History of chicken pox    History of COVID-19 07/2019   high fever sob, x 7 days all symptoms resolved   Injury of peroneal tendon of left foot, initial encounter    Left bundle branch block 02/2019   Dr.Paula Ross   Migraines    Pneumonia    PONV (postoperative nausea and vomiting)    ponv with several surgeries, likes scopolamine patch   Thyroid disease    Multiple benign nodules--thyroid removed   Past Surgical History:  Procedure Laterality Date   ANTERIOR AND POSTERIOR REPAIR N/A 06/14/2020   Procedure: ANTERIOR (CYSTOCELE) AND POSTERIOR REPAIR (RECTOCELE);  Surgeon:  Nunzio Cobbs, MD;  Location: [facility];  Service: Gynecology;  Laterality: N/A;   BLADDER SUSPENSION N/A 06/14/2020   Procedure: TRANSVAGINAL TAPE (TVT) PROCEDURE/EXACT MIDURETHRAL SLING;  Surgeon: Nunzio Cobbs, MD;  [facility];  Service: Gynecology;  Laterality: N/A;  EXACT MIDURETHRAL SLING   CARDIAC CATHETERIZATION  2009   results normal done in [place]   CHOLECYSTECTOMY  1995   laparoscopic   CYSTOSCOPY N/A 06/14/2020   Procedure: CYSTOSCOPY;  Surgeon: Nunzio Cobbs, MD;  [facility];  Service: Gynecology;  Laterality: N/A;   HAMMER TOE SURGERY Bilateral 2009   HERNIA REPAIR  2015   Has abdominal wall mesh - [place], [place].  Laparoscopic incisional hernia repari with 7 x 9 inch Ventralight ST with Echo mesh   LAPAROSCOPIC HYSTERECTOMY  2007   Supracervical hysterectomy with cystosctopy [place], [place] partial   THYROID SURGERY  2013   total thyroidectomy due to 11 nodules   Patient Active Problem List   Diagnosis Date Noted   Rheumatoid arthritis of multiple sites with negative rheumatoid factor ([place]) 12/22/2021   Status post surgery 06/14/2020   Genetic testing 02/18/2020   Family history  of breast cancer    High risk medication use 10/30/2019   Left bundle branch block 04/28/2019   Right wrist pain 03/13/2019   Hamstring strain, left, initial encounter 02/20/2019   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   Primary osteoarthritis of both feet 10/09/2018   Hx of migraines 09/27/2018   History of gastroesophageal reflux (GERD) 09/27/2018   Palindromic rheumatism, 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   Moderate persistent asthma 07/05/2018   Hypothyroid 06/28/2018    PCP: Theresa Duty  REFERRING PROVIDER: Lorenda Peck, DPM   REFERRING DIAG: [id number] (ICD-10-CM) - Peroneal tendonitis, left   THERAPY DIAG:  Pain in left ankle and joints of left foot  - Primary M25.572   Pain in  left foot  M79.672   Stiffness of left ankle, not elsewhere classified  M25.672   Muscle weakness (generalized)  M62.81   Difficulty in walking, not elsewhere classified  R26.2     Rationale for Evaluation and Treatment Rehabilitation  ONSET DATE: ~8-10 weeks  SUBJECTIVE:   SUBJECTIVE STATEMENT: Pt reports she was sore for a few days after IASTM. Feels sore but not sure if it's the weather impacting it.  PERTINENT HISTORY: Arthritis Asthma Multiple surgeries -- has pins in bilat toes Endorses prior back and bilat hip pain   PAIN:  Are you having pain? Yes: NPRS scale: 3/10 Pain location: side of ankle Pain description: sore Aggravating factors: Walking, separating toes and pointing toes, worse in the morning Relieving factors: Icing, CBD   PRECAUTIONS: None   PATIENT GOALS  Walk pain free, better mobility   OBJECTIVE:    TODAY'S TREATMENT: 04/24/22 L2 x 5 min recumbent bike Seated:  DF + Ev 2x10 blue tband  PF + inv 2x10 blue tband Standing:  Arch lifting 2x10 Manual: STM  to left peroneals and extensor digitorum brevis  K tape to facilitate peroneals with two side by side "I" strip, stabilization of distal fibular head    04/20/22 L2 x 5 min recumbent bike Standing:  Gastroc stretch 2x30 sec  Soleus stretch 2x30 sec  Peroneal stretch standing 2x30 sec  SL heel raise 2x10  DL Toe raises 2x10  On blue side of bosu:   Single leg standing x30 sec   Ankle inv x10   Ankle ev x10   Seated:  Figure 4 stretch x30 sec L&R  Peroneal nerve glide x10 L  HS stretch x30 sec L  Sidelying:  Green tband clam 2x10  Supine:  Piriformis stretch x30 sec L&R  Manual: STM  to left peroneals and extensor digitorum brevis  K tape to facilitate peroneals with  two side by side "I" strip, stabilization of distal fibular head      PATIENT EDUCATION:  Education details: Exam findings, POC, initial HEP Person educated: Patient Education method: Explanation and  Handouts Education comprehension: verbalized understanding   HOME EXERCISE PROGRAM: Access Code: [email or web address] URL: [email or web address] Date: 04/06/2022 Prepared by: Estill Bamberg April Thurnell Garbe  Exercises - Gastroc Stretch on Wall  - 1 x daily - 7 x weekly - 2 sets - 30 sec hold - Soleus Stretch on Wall  - 1 x daily - 7 x weekly - 2 sets - 30 sec hold - Supine Peroneal Nerve Glide (Mirrored)  - 1 x daily - 7 x weekly - 1 sets - 10 reps - CLX Ankle Dorsiflexion and Eversion  - 1 x daily - 7 x weekly - 2 sets - 10 reps - Seated Ankle Inversion Eversion PROM  - 1 x daily -  7 x weekly - 2 sets - 30 sec hold - Seated Arch Lifts  - 1 x daily - 7 x weekly - 2 sets - 10 reps  ASSESSMENT:  CLINICAL IMPRESSION: Continued taping this session. 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 Progressed tband strengthening and heel lifting in standing.     OBJECTIVE IMPAIRMENTS decreased activity tolerance, decreased endurance, decreased mobility, difficulty walking, decreased ROM, decreased strength, increased fascial restrictions, increased muscle spasms, impaired sensation, and pain.   ACTIVITY LIMITATIONS lifting, standing, squatting, sleeping, stairs, transfers, and locomotion level  PARTICIPATION LIMITATIONS: shopping, community activity, and yard work  PERSONAL FACTORS Past/current experiences and 1-2 comorbidities: Hip and back pain  are also affecting patient's functional outcome.   REHAB POTENTIAL: Good  CLINICAL DECISION MAKING: Evolving/moderate complexity  EVALUATION COMPLEXITY: Moderate   GOALS: Goals reviewed with patient? Yes  SHORT TERM GOALS: Target date: 04/25/2022   Pt will be ind with initial HEP Baseline: Goal status: INITIAL  2.  Pt will demo L = R ankle ROM for improved mobility on stairs Baseline: See above Goal status: INITIAL  3.  Pt will demo L = R hamstring length for reduced muscle imbalance Baseline:  Goal status: INITIAL   LONG TERM GOALS:  Target date: 05/16/2022   Pt will be ind with advanced HEP Baseline:  Goal status: INITIAL  2.  Pt will report >/=50% decrease in pain/burning for improved comfort with normal activities Baseline:  Goal status: INITIAL  3.  Pt with demo at least 4+/5 L LE strength for improved weightbearing Baseline:  Goal status: INITIAL  4.  Pt will have increased FOTO score to >/=73 Baseline:  Goal status: INITIAL     PLAN: PT FREQUENCY: 2x/week  PT DURATION: 6 weeks  PLANNED INTERVENTIONS: Therapeutic exercises, Therapeutic activity, Neuromuscular re-education, Balance training, Gait training, Patient/Family education, Self Care, Joint mobilization, Stair training, DME instructions, Aquatic Therapy, Dry Needling, Cryotherapy, Moist heat, Taping, Vasopneumatic device, Ultrasound, Ionotophoresis 9m/ml Dexamethasone, Manual therapy, and Re-evaluation  PLAN FOR NEXT SESSION: Assess tape, continue Manual work    Geselle Cardosa [place] PT,DPT 04/24/2022, 10:30 AM

## 2022-04-26 ENCOUNTER — Encounter: Payer: 59 | Admitting: Physical Therapy

## 2022-05-02 ENCOUNTER — Ambulatory Visit: Payer: 59 | Admitting: Physical Therapy

## 2022-05-03 ENCOUNTER — Other Ambulatory Visit: Payer: Self-pay | Admitting: Podiatry

## 2022-05-22 NOTE — Progress Notes (Signed)
Cardiology Office Note   Date:  05/23/2022   ID:  [name], DOB 08/23/1966, MRN [id number]  PCP:  Fredirick Lathe, PA-C  Cardiologist:   Dorris Carnes, MD   Pt presednts for follow up of bradycardia, dizzienss     History of Present Illness: [name] is a 56 y.o. female with a history of chest pain (pleuritic, positional), bradycardia , LBBB and dizziness  In 2020  48 hour holter showed SR to sT 60 to 178 bpm  Average HR was 84 BPM  I last saw the pt in clinic in 2021 Since seen she has done well  Notes occasional dizziness with quick standing / moving    Not at other times Denies CP     Remains active  Daughter has POTS   Sees S [facility]   Appt at [facility] later this month    Current Meds  Medication Sig   acetaminophen (TYLENOL) 500 MG tablet Take 500 mg by mouth every 6 (six) hours as needed.   ADVAIR DISKUS 250-50 MCG/DOSE AEPB INHALE 1 PUFF BY MOUTH TWICE A DAY   albuterol (VENTOLIN HFA) 108 (90 Base) MCG/ACT inhaler TAKE 2 PUFFS BY MOUTH EVERY 6 HOURS AS NEEDED FOR WHEEZE OR SHORTNESS OF BREATH   cholecalciferol (VITAMIN D) 400 units TABS tablet Take 5,000 Units by mouth.    cyclobenzaprine (FLEXERIL) 10 MG tablet Daily at bed time as needed   fexofenadine-pseudoephedrine (ALLEGRA-D 24) 180-240 MG 24 hr tablet Take 1 tablet by mouth daily.   fluticasone-salmeterol (ADVAIR HFA) 115-21 MCG/ACT inhaler Inhale 2 puffs into the lungs 2 (two) times daily.   hydroxychloroquine (PLAQUENIL) 200 MG tablet TAKE 1 TABLET BY MOUTH TWICE A DAY   leflunomide (ARAVA) 10 MG tablet TAKE 1 TABLET BY MOUTH EVERY DAY   levothyroxine (SYNTHROID) 150 MCG tablet Take 1 tablet (150 mcg total) by mouth daily before breakfast.   Magnesium 400 MG CAPS Take by mouth.   meloxicam (MOBIC) 15 MG tablet TAKE 1 TABLET (15 MG TOTAL) BY MOUTH DAILY.   montelukast (SINGULAIR) 10 MG tablet TAKE 1 TABLET BY MOUTH EVERYDAY AT BEDTIME   TURMERIC PO Take by mouth daily.   [DISCONTINUED] rosuvastatin  (CRESTOR) 5 MG tablet Take 0.5 tablets (2.5 mg total) by mouth daily.     Allergies:   Orencia [abatacept], Shellfish allergy, Strawberry (diagnostic), Gluten meal, Lortab [hydrocodone-acetaminophen], Morphine and related, and Augmentin [amoxicillin-pot clavulanate]   Past Medical History:  Diagnosis Date   Arthritis    ra zero negative   Asthma    mild/moderate persistent asthma   Complication of anesthesia    slow to awaken 25 yrs ago   Elevated liver function tests dr Neil Crouch pcp manages   for last year   Family history of breast cancer    Fibroid    Fractured rib    Frequent headaches    GERD (gastroesophageal reflux disease)    Gluten intolerance    H/O seasonal allergies    History of chicken pox    History of COVID-19 07/2019   high fever sob, x 7 days all symptoms resolved   Injury of peroneal tendon of left foot, initial encounter    Left bundle branch block 02/2019   Dr.Jerolyn Flenniken   Migraines    Pneumonia    PONV (postoperative nausea and vomiting)    ponv with several surgeries, likes scopolamine patch   Thyroid disease    Multiple benign nodules--thyroid removed    Past Surgical  History:  Procedure Laterality Date   ANTERIOR AND POSTERIOR REPAIR N/A 06/14/2020   Procedure: ANTERIOR (CYSTOCELE) AND POSTERIOR REPAIR (RECTOCELE);  Surgeon: Nunzio Cobbs, MD;  Location: [facility];  Service: Gynecology;  Laterality: N/A;   BLADDER SUSPENSION N/A 06/14/2020   Procedure: TRANSVAGINAL TAPE (TVT) PROCEDURE/EXACT MIDURETHRAL SLING;  Surgeon: Nunzio Cobbs, MD;  Location: [facility];  Service: Gynecology;  Laterality: N/A;  EXACT MIDURETHRAL SLING   CARDIAC CATHETERIZATION  2009   results normal done in [place]   CHOLECYSTECTOMY  1995   laparoscopic   CYSTOSCOPY N/A 06/14/2020   Procedure: CYSTOSCOPY;  Surgeon: Nunzio Cobbs, MD;  Location: [facility];  Service: Gynecology;   Laterality: N/A;   HAMMER TOE SURGERY Bilateral 2009   HERNIA REPAIR  2015   Has abdominal wall mesh - [place], [place].  Laparoscopic incisional hernia repari with 7 x 9 inch Ventralight ST with Echo mesh   LAPAROSCOPIC HYSTERECTOMY  2007   Supracervical hysterectomy with cystosctopy [place], [place] partial   THYROID SURGERY  2013   total thyroidectomy due to 11 nodules     Social History:  The patient  reports that she has never smoked. She has never been exposed to tobacco smoke. She has never used smokeless tobacco. She reports current alcohol use. She reports that she does not use drugs.   Family History:  The patient's family history includes Alcohol abuse in her brother and paternal grandmother; Arthritis in her mother; Asthma in her brother, daughter, son, and son; Birth defects in her brother; Breast cancer (age of onset: 89) in her mother; Breast cancer (age of onset: 82) in her cousin; Breast cancer (age of onset: 30) in her maternal aunt and paternal grandfather; Breast cancer (age of onset: 28) in her paternal aunt; COPD in her sister; Cancer in her brother, paternal grandfather, and paternal grandmother; Cancer (age of onset: 4) in her mother; Diabetes in her father; Early death in her brother; Glaucoma in her father; Heart attack in her father, maternal grandmother, and paternal grandfather; Heart disease in her father and maternal grandmother; Hyperlipidemia in her brother, father, and maternal grandmother; Hypertension in her father; Kidney cancer (age of onset: 39) in her paternal aunt; Mental illness in her brother; Miscarriages / Stillbirths in her mother; Peripheral Artery Disease in her sister; Prostate cancer in her cousin; Stroke in her father and maternal grandmother; Throat cancer in her paternal uncle; Thyroid cancer in her paternal uncle.    ROS:  Please see the history of present illness. All other systems are reviewed and  Negative to the above problem except as noted.     PHYSICAL EXAM: VS:  BP 110/80   Pulse 86   Ht '5\' 7"'$  (1.702 m)   Wt 195 lb 3.2 oz (88.5 kg)   LMP  (LMP Unknown)   SpO2 97%   BMI 30.57 kg/m   GEN: Well nourished, well developed, in no acute distress  HEENT: normal  Neck: no JVD, no carotid bruits  Cardiac: RRR; no murmurs.  No LE  edema  Respiratory:  clear to auscultation bilaterally,  GI: soft, nontender, nondistended, + BS  No hepatomegaly  MS: no deformity Moving all extremities   Skin: warm and dry, no rash   Neuro:  Strength and sensation are intact Psych: euthymic mood, full affect   EKG:  EKG is not done today    CTcoronary angiogram   2020  Aorta:  Normal size.  No calcifications.  No dissection.   Aortic Valve:  Trileaflet.  No calcifications.   Coronary Arteries:  Normal coronary origin.  Right dominance.   RCA is a large dominant artery that gives rise to PDA and PLVB. There is no plaque.   Left main is a large artery that gives rise to LAD, Ramus and LCX arteries. There is no plaque.   LAD is a large vessel that gives rise to a large 1st diagonal with a large superior sub branch and a very small inferior sub branch. The ongoing LAD gives rise to a small 2nd diagonal. There is minimal calcified plaque in the ostial LAD with associated stenosis of 0 to 24% stenosis.   Ramus is a small to moderate sized vessel that has no plaque.   LCX is a non-dominant artery that gives rise to one large OM1 branch. There is no plaque.   Other findings:   Normal pulmonary vein drainage into the left atrium.   Normal let atrial appendage without a thrombus.   Normal size of the pulmonary artery.   IMPRESSION: 1. Coronary calcium score of 3. This was 84th percentile for age and sex matched control.   2.  Normal coronary origin with right dominance.   3. Minimal atherosclerotic plaque in the ostial LAD with associated stenosis of 0-24%. CAD-RADS 1.   4.  Recommend aggressive risk factor  modification.  Echo   Aug 2020  1. The left ventricle has normal systolic function, with an ejection fraction of 55-60%. The cavity size was normal. There is mildly increased left ventricular wall thickness. Left ventricular diastolic Doppler parameters are consistent with impaired relaxation. Indeterminate filling pressures The E/e' is 8-15. There is abnormal septal motion consistent with left bundle branch block. 2. The right ventricle has normal systolic function. The cavity was normal. There is no increase in right ventricular wall thickness. 3. The mitral valve is abnormal. Mild thickening of the mitral valve leaflet. 4. The tricuspid valve is grossly normal. 5. The aortic valve is tricuspid. 6. The aorta is normal unless otherwise noted. 7. The inferior vena cava was dilated in size with >50% respiratory variability. 8. Trivial pericardial effusion is present. 9. The pericardial effusion is posterior to the left ventricle.   Lipid Panel    Component Value Date/Time   CHOL 151 12/20/2021 0926   CHOL 147 07/28/2019 0847   TRIG 96 12/20/2021 0926   HDL 66 12/20/2021 0926   HDL 71 07/28/2019 0847   CHOLHDL 2.3 12/20/2021 0926   VLDL 17.0 06/28/2018 0938   LDLCALC 67 12/20/2021 0926      Wt Readings from Last 3 Encounters:  05/23/22 195 lb 3.2 oz (88.5 kg)  04/19/22 195 lb 3.2 oz (88.5 kg)  04/06/22 194 lb 3.2 oz (88.1 kg)      ASSESSMENT AND PLAN:  1  Hx bradycardia  Monitor in past without signidicant bradycardia   Pt asymptomatic   Follow   2   Hx HTN   BP is excellent      3  CAD   Minimal plaquing on CT scan   Ca score is 3    Follow   Risk factor modifcation  4  Dizzienss  Only with quick changes in position     5   LBBB   Old.  Myovue  with normal perfusion   Echo with normal LVEF    Follow    6 HL   On Crestor  Switch to every other day 5 mg    LDL in April was 67  HDL was 66        Current medicines are reviewed at length with the patient today.   The patient does not have concerns regarding medicines.  Signed, Dorris Carnes, MD  05/23/2022 11:17 AM    [address], [place], [place]  [address] Phone: [phone]; Fax: [phone]

## 2022-05-23 ENCOUNTER — Encounter: Payer: Self-pay | Admitting: Internal Medicine

## 2022-05-23 ENCOUNTER — Ambulatory Visit: Payer: 59 | Attending: Internal Medicine | Admitting: Internal Medicine

## 2022-05-23 VITALS — BP 110/80 | HR 86 | Ht 67.0 in | Wt 195.2 lb

## 2022-05-23 DIAGNOSIS — I2510 Atherosclerotic heart disease of native coronary artery without angina pectoris: Secondary | ICD-10-CM | POA: Diagnosis not present

## 2022-05-23 MED ORDER — ROSUVASTATIN CALCIUM 5 MG PO TABS: 2.5000 mg | ORAL_TABLET | ORAL | 0 refills | Status: DC

## 2022-05-23 NOTE — Patient Instructions (Signed)
Medication Instructions:  Decrease Crestor to every other day  *If you need a refill on your cardiac medications before your next appointment, please call your pharmacy*   Lab Work:  If you have labs (blood work) drawn today and your tests are completely normal, you will receive your results only by: [place] (if you have MyChart) OR A paper copy in the mail If you have any lab test that is abnormal or we need to change your treatment, we will call you to review the results.   Testing/Procedures:    Follow-Up: At [facility], you and your health needs are our priority.  As part of our continuing mission to provide you with exceptional heart care, we have created designated Provider Care Teams.  These Care Teams include your primary Cardiologist (physician) and Advanced Practice Providers (APPs -  Physician Assistants and Nurse Practitioners) who all work together to provide you with the care you need, when you need it.  We recommend signing up for the patient portal called "MyChart".  Sign up information is provided on this After Visit Summary.  MyChart is used to connect with patients for Virtual Visits (Telemedicine).  Patients are able to view lab/test results, encounter notes, upcoming appointments, etc.  Non-urgent messages can be sent to your provider as well.   To learn more about what you can do with MyChart, go to [email or web address].    Your next appointment:   1 year(s)  The format for your next appointment:   In Person  Provider:   Dorris Carnes, MD     Other Instructions   Important Information About Sugar

## 2022-05-25 ENCOUNTER — Ambulatory Visit: Payer: 59 | Admitting: Podiatry

## 2022-06-06 ENCOUNTER — Ambulatory Visit: Payer: 59 | Admitting: Podiatry

## 2022-06-12 ENCOUNTER — Encounter: Payer: Self-pay | Admitting: Physician Assistant

## 2022-06-13 ENCOUNTER — Encounter: Payer: Self-pay | Admitting: Podiatry

## 2022-06-13 ENCOUNTER — Ambulatory Visit (INDEPENDENT_AMBULATORY_CARE_PROVIDER_SITE_OTHER): Payer: 59 | Admitting: Podiatry

## 2022-06-13 DIAGNOSIS — M7672 Peroneal tendinitis, left leg: Secondary | ICD-10-CM | POA: Diagnosis not present

## 2022-06-13 MED ORDER — MELOXICAM 15 MG PO TABS: 15.0000 mg | ORAL_TABLET | Freq: Every day | ORAL | 0 refills | Status: DC

## 2022-06-13 MED ORDER — TRIAMCINOLONE ACETONIDE 10 MG/ML IJ SUSP
10.0000 mg | Freq: Once | INTRAMUSCULAR | Status: AC
  Administered 2022-06-13: 10 mg

## 2022-06-13 NOTE — Progress Notes (Signed)
  Subjective:  Patient ID: [name], female    DOB: 05/21/1966,   MRN: [id number]  No chief complaint on file.   56 y.o. female presents for follow-up of left peroneal tendonitis. Relates she was doing well almost 70% better but then recently had to do a bunch of traveling to [place] clinic for her daughter and a health concern. Relates it has flared since then as she had to stop therapy. Injection did help for a time. PT has been helpful  Denies diabetes.  Does have a history of RA. Denies any other pedal complaints. Denies n/v/f/c.   Past Medical History:  Diagnosis Date   Arthritis    ra zero negative   Asthma    mild/moderate persistent asthma   Complication of anesthesia    slow to awaken 25 yrs ago   Elevated liver function tests dr Neil Crouch pcp manages   for last year   Family history of breast cancer    Fibroid    Fractured rib    Frequent headaches    GERD (gastroesophageal reflux disease)    Gluten intolerance    H/O seasonal allergies    History of chicken pox    History of COVID-19 07/2019   high fever sob, x 7 days all symptoms resolved   Injury of peroneal tendon of left foot, initial encounter    Left bundle branch block 02/2019   Dr.Paula Ross   Migraines    Pneumonia    PONV (postoperative nausea and vomiting)    ponv with several surgeries, likes scopolamine patch   Thyroid disease    Multiple benign nodules--thyroid removed    Objective:  Physical Exam: Vascular: DP/PT pulses 2/4 bilateral. CFT <3 seconds. Normal hair growth on digits. No edema.  Skin. No lacerations or abrasions bilateral feet.  Musculoskeletal: MMT 5/5 bilateral lower extremities in DF, PF, Inversion and Eversion. Deceased ROM in DF of ankle joint. Tender to palpation at insertion of peroneal tendon and tracking proximal along tendon to lateral malleolus. Pain with DF of the foot and some with eversion. No pain with PF or inversion. No pain anywhere else along foot.  Neurological:  Sensation intact to light touch.   Assessment:   1. Peroneal tendonitis, left      Plan:  Patient was evaluated and treated and all questions answered. X-rays reviewed and discussed with patient. No acute fractures or dislocations noted. Reviewed notes from rheumatology  Discussed peroneal tendinitis and treatment options at length with patient Discussed stretching exercises and provided handout. Refill for meloxicam  Continue with brace as tolerated.  Continue PT when schedule allows.  Injection requested today. Procedure below.  Discussed potential for some pain coming from the back and possible the back injections planned may help with foot pain.  Discussed that if the symptoms do not improve can consider MRI. Patient to return as needed as her schedule calms down with her daughter.   Procedure: Injection Tendon/Ligament Discussed alternatives, risks, complications and verbal consent was obtained.  Location: Left peroneal tendon. Skin Prep: Alcohol. Injectate: 1cc 0.5% marcaine plain, 1 cc kenalog Disposition: Patient tolerated procedure well. Injection site dressed with a band-aid.  Post-injection care was discussed and return precautions discussed.    Lorenda Peck, DPM

## 2022-06-15 ENCOUNTER — Other Ambulatory Visit: Payer: Self-pay | Admitting: Physician Assistant

## 2022-06-15 DIAGNOSIS — Z79899 Other long term (current) drug therapy: Secondary | ICD-10-CM

## 2022-06-15 DIAGNOSIS — M0609 Rheumatoid arthritis without rheumatoid factor, multiple sites: Secondary | ICD-10-CM

## 2022-06-15 NOTE — Telephone Encounter (Signed)
Please schedule patient a follow up visit. Patient due November 2023. Thanks!   Follow-Up Instructions: Return in about 3 months (around 07/20/2022) for Rheumatoid arthritis.

## 2022-06-15 NOTE — Telephone Encounter (Signed)
Next Visit: Due November 2023. Message sent to the front to schedule.   Last Visit: 04/19/2022  Last Fill: 03/20/2022  DX: Rheumatoid arthritis of multiple sites with negative rheumatoid factor   Current Dose per office note 04/19/2022: Arava 10 mg 1 tablet by mouth daily   Labs: 04/19/2022 CBC and CMP are normal.  Okay to refill Arava?

## 2022-06-16 ENCOUNTER — Other Ambulatory Visit: Payer: Self-pay | Admitting: Physician Assistant

## 2022-06-16 DIAGNOSIS — M1230 Palindromic rheumatism, unspecified site: Secondary | ICD-10-CM

## 2022-06-16 NOTE — Telephone Encounter (Signed)
Next Visit: 07/25/2022   Last Visit: 04/19/2022   Last Fill: 03/16/2022  DX: Rheumatoid arthritis of multiple sites with negative rheumatoid factor    Current Dose per office note 04/19/2022: Plaquenil 200 mg 1 tablet by mouth twice daily  Labs: 04/19/2022 CBC and CMP are normal.  PLQ Eye Exam: 05/12/2021 WNL  Patient advised she is due to update her PLQ eye exam. Patient will call to schedule appointment and call to advise [place] when it's scheduled for.   Okay to refill PLQ?

## 2022-06-22 ENCOUNTER — Other Ambulatory Visit: Payer: Self-pay | Admitting: Physician Assistant

## 2022-07-08 ENCOUNTER — Other Ambulatory Visit: Payer: Self-pay | Admitting: Physician Assistant

## 2022-07-10 ENCOUNTER — Other Ambulatory Visit: Payer: Self-pay | Admitting: Podiatry

## 2022-07-11 ENCOUNTER — Other Ambulatory Visit: Payer: Self-pay | Admitting: Physician Assistant

## 2022-07-11 NOTE — Telephone Encounter (Signed)
Called pt and lvm advising provider recommendations, advised cb number to call to schedule VV appt and what Alyssa had available for her

## 2022-07-11 NOTE — Telephone Encounter (Signed)
Please see pt msg and advise 

## 2022-07-12 ENCOUNTER — Ambulatory Visit (INDEPENDENT_AMBULATORY_CARE_PROVIDER_SITE_OTHER): Payer: 59 | Admitting: Physician Assistant

## 2022-07-12 ENCOUNTER — Encounter: Payer: Self-pay | Admitting: Physician Assistant

## 2022-07-12 VITALS — BP 118/90 | HR 100 | Temp 98.0°F | Ht 67.0 in | Wt 195.2 lb

## 2022-07-12 DIAGNOSIS — U071 COVID-19: Secondary | ICD-10-CM | POA: Diagnosis not present

## 2022-07-12 DIAGNOSIS — R300 Dysuria: Secondary | ICD-10-CM

## 2022-07-12 DIAGNOSIS — R350 Frequency of micturition: Secondary | ICD-10-CM | POA: Diagnosis not present

## 2022-07-12 LAB — POCT URINALYSIS DIPSTICK
Bilirubin, UA: NEGATIVE
Blood, UA: NEGATIVE
Glucose, UA: NEGATIVE
Ketones, UA: NEGATIVE
Leukocytes, UA: NEGATIVE
Nitrite, UA: NEGATIVE
Protein, UA: NEGATIVE
Spec Grav, UA: 1.01 (ref 1.010–1.025)
Urobilinogen, UA: 0.2 E.U./dL
pH, UA: 6.5 (ref 5.0–8.0)

## 2022-07-12 LAB — POC COVID19 BINAXNOW: SARS Coronavirus 2 Ag: POSITIVE — AB

## 2022-07-12 LAB — GLUCOSE, POCT (MANUAL RESULT ENTRY): POC Glucose: 87 mg/dl (ref 70–99)

## 2022-07-12 MED ORDER — NITROFURANTOIN MONOHYD MACRO 100 MG PO CAPS: 100.0000 mg | ORAL_CAPSULE | Freq: Two times a day (BID) | ORAL | 0 refills | Status: DC

## 2022-07-12 MED ORDER — MOLNUPIRAVIR EUA 200MG CAPSULE: 4.0000 | ORAL_CAPSULE | Freq: Two times a day (BID) | ORAL | 0 refills | Status: AC

## 2022-07-12 NOTE — Patient Instructions (Signed)
Thanks for coming in today.  Your COVID test was positive.  Some of the medications you are taking do interact with Paxlovid, therefore I have sent Molnupiravir to start on as an antiviral to fight against COVID-19.  Please take this medication as directed.  Call me if you have any issues or side effects that come up.  Please keep me updated on how you are feeling.  If acutely worse symptoms such as chest pain, shortness of breath, severe fatigue or dizziness, you may need to present to the emergency department.  For the urinary symptoms, I do wonder if you have interstitial cystitis, which is inflammation of the bladder, secondary to stress and now COVID infection.  Please take the Macrobid as directed.  See handout for additional guidelines.  Try to avoid all caffeine.  I have sent your urine off for culture to make sure no infection grows from this as well.

## 2022-07-12 NOTE — Progress Notes (Signed)
Subjective:    Patient ID: [name], female    DOB: 26-Jul-1966, 56 y.o.   MRN: [id number]  Chief Complaint  Patient presents with   Urinary Tract Infection    Pt states the UTI sx has been since 2x weeks, says she did the AZO twice and they were purple. Pt also thinks she may have covid. Pt also wants to know can you get bloodwork for her tsh today.     Urinary Tract Infection    Patient is in today for acute sick symptoms. C/o urinary frequency x 2 weeks. Occasional twinge in R flank. Rare burning. States she is running to the bathroom, very unusual for her.  Husband home sick with COVID-19.  She is now starting to have some ear pain and fatigue this morning.  She did test herself last night and thought that she saw a pink line which was positive for COVID.  Very stressful September - back and forth to [facility] with daughter who has POTS. Also has first newborn grandchild on the way.   Past Medical History:  Diagnosis Date   Arthritis    ra zero negative   Asthma    mild/moderate persistent asthma   Complication of anesthesia    slow to awaken 25 yrs ago   Elevated liver function tests dr Neil Crouch pcp manages   for last year   Family history of breast cancer    Fibroid    Fractured rib    Frequent headaches    GERD (gastroesophageal reflux disease)    Gluten intolerance    H/O seasonal allergies    History of chicken pox    History of COVID-19 07/2019   high fever sob, x 7 days all symptoms resolved   Injury of peroneal tendon of left foot, initial encounter    Left bundle branch block 02/2019   Dr.Paula Ross   Migraines    Pneumonia    PONV (postoperative nausea and vomiting)    ponv with several surgeries, likes scopolamine patch   Thyroid disease    Multiple benign nodules--thyroid removed    Past Surgical History:  Procedure Laterality Date   ANTERIOR AND POSTERIOR REPAIR N/A 06/14/2020   Procedure: ANTERIOR (CYSTOCELE) AND POSTERIOR REPAIR  (RECTOCELE);  Surgeon: Nunzio Cobbs, MD;  Location: [facility];  Service: Gynecology;  Laterality: N/A;   BLADDER SUSPENSION N/A 06/14/2020   Procedure: TRANSVAGINAL TAPE (TVT) PROCEDURE/EXACT MIDURETHRAL SLING;  Surgeon: Nunzio Cobbs, MD;  Location: [facility]  Service: Gynecology;  Laterality: N/A;  EXACT MIDURETHRAL SLING   CARDIAC CATHETERIZATION  2009   results normal done in [place]   CHOLECYSTECTOMY  1995   laparoscopic   CYSTOSCOPY N/A 06/14/2020   Procedure: CYSTOSCOPY;  Surgeon: Nunzio Cobbs, MD;  Location: [facility];  Service: Gynecology;  Laterality: N/A;   HAMMER TOE SURGERY Bilateral 2009   HERNIA REPAIR  2015   Has abdominal wall mesh - [place], [place].  Laparoscopic incisional hernia repari with 7 x 9 inch Ventralight ST with Echo mesh   LAPAROSCOPIC HYSTERECTOMY  2007   Supracervical hysterectomy with cystosctopy [place] Flav[place], [place] partial   THYROID SURGERY  2013   total thyroidectomy due to 11 nodules    Family History  Problem Relation Age of Onset   Arthritis Mother    Cancer Mother 42       breast ca--dec age 59   Miscarriages /  Stillbirths Mother    Breast cancer Mother 38   Glaucoma Father    Diabetes Father    Heart attack Father    Heart disease Father        pacemaker    Hyperlipidemia Father    Hypertension Father    Stroke Father        x4   COPD Sister    Peripheral Artery Disease Sister    Asthma Brother    Birth defects Brother    Cancer Brother        skin   Hyperlipidemia Brother    Alcohol abuse Brother    Early death Brother        suicide   Mental illness Brother    Heart attack Maternal Grandmother    Heart disease Maternal Grandmother    Hyperlipidemia Maternal Grandmother    Stroke Maternal Grandmother    Alcohol abuse Paternal Grandmother    Cancer Paternal Grandmother        colon   Cancer Paternal Grandfather        breast   Heart attack  Paternal Grandfather    Breast cancer Paternal Grandfather 57   Asthma Daughter    Asthma Son    Asthma Son    Breast cancer Maternal Aunt 60   Kidney cancer Paternal Aunt 86   Breast cancer Paternal Aunt 39   Throat cancer Paternal Uncle    Thyroid cancer Paternal Uncle    Breast cancer Cousin 54   Prostate cancer Cousin     Social History   Tobacco Use   Smoking status: Never    Passive exposure: Never   Smokeless tobacco: Never  Vaping Use   Vaping Use: Never used  Substance Use Topics   Alcohol use: Yes    Comment: 2 glasses of wine/month   Drug use: Never     Allergies  Allergen Reactions   Orencia [Abatacept] Hives   Shellfish Allergy Anaphylaxis and Hives   Strawberry (Diagnostic) Anaphylaxis    Hazelnuts hives asthma, okra hives asthma Strawberry throat closes   Gluten Meal     Bloody diarrhea and hives   Lortab [Hydrocodone-Acetaminophen]     Swelling of tongue   Morphine And Related Itching    irritable   Augmentin [Amoxicillin-Pot Clavulanate] Rash    Review of Systems NEGATIVE UNLESS OTHERWISE INDICATED IN HPI      Objective:     BP (!) 118/90 (BP Location: Left Arm, Patient Position: Sitting)   Pulse 100   Temp 98 F (36.7 C) (Temporal)   Ht '5\' 7"'$  (1.702 m)   Wt 195 lb 3.2 oz (88.5 kg)   LMP  (LMP Unknown)   SpO2 97%   BMI 30.57 kg/m   Wt Readings from Last 3 Encounters:  07/12/22 195 lb 3.2 oz (88.5 kg)  05/23/22 195 lb 3.2 oz (88.5 kg)  04/19/22 195 lb 3.2 oz (88.5 kg)    BP Readings from Last 3 Encounters:  07/12/22 (!) 118/90  05/23/22 110/80  04/19/22 117/75     Physical Exam Vitals and nursing note reviewed.  Constitutional:      General: She is not in acute distress.    Appearance: Normal appearance. She is not ill-appearing.  HENT:     Head: Normocephalic and atraumatic.  Cardiovascular:     Rate and Rhythm: Normal rate and regular rhythm.     Pulses: Normal pulses.     Heart sounds: Normal heart sounds.   Pulmonary:  Effort: Pulmonary effort is normal.     Breath sounds: Normal breath sounds.  Abdominal:     General: Abdomen is flat. Bowel sounds are normal.     Palpations: Abdomen is soft.     Tenderness: There is no abdominal tenderness. There is no right CVA tenderness, left CVA tenderness, guarding or rebound.  Skin:    General: Skin is warm and dry.  Neurological:     General: No focal deficit present.     Mental Status: She is alert.  Psychiatric:        Mood and Affect: Mood normal.        Assessment & Plan:  COVID-19 -     POC COVID-19 BinaxNow  Urinary frequency -     Urine Culture -     POCT glucose (manual entry)  Dysuria -     POCT urinalysis dipstick -     Urine Culture  Other orders -     Nitrofurantoin Monohyd Macro; Take 1 capsule (100 mg total) by mouth 2 (two) times daily.  Dispense: 14 capsule; Refill: 0 -     molnupiravir EUA; Take 4 capsules (800 mg total) by mouth 2 (two) times daily for 5 days.  Dispense: 40 capsule; Refill: 68   56 year old female with history of asthma and rheumatoid arthritis, now testing positive for COVID-19 in office today and just starting with symptoms.  Plan to start on Molnupiravir, possible side effects discussed.  Considered Paxlovid, but several medications of hers interact with this.  Conservative treatment at home.  Isolate x5 days, then mask around others for 5 days as long as feeling better.  ED if acutely worse.  Reassured patient that point-of-care urinalysis was clear.  I do wonder about IC with her urinary frequency.  Reassured that her glucose test was normal.  Plan to start on Macrobid to help with symptoms.  Culture.  Recheck if worse or no improvement of symptoms.     Return if symptoms worsen or fail to improve.  This note was prepared with assistance of Systems analyst. Occasional wrong-word or sound-a-like substitutions may have occurred due to the inherent limitations of voice  recognition software.   Time Spent: 36 minutes of total time was spent on the date of the encounter performing the following actions: chart review prior to seeing the patient, obtaining history, performing a medically necessary exam, counseling on the treatment plan, placing orders, and documenting in our EHR.     Mitsy Owen M Jacobi Nile, PA-C

## 2022-07-13 LAB — URINE CULTURE
MICRO NUMBER:: 14099657
SPECIMEN QUALITY:: ADEQUATE

## 2022-07-13 NOTE — Progress Notes (Unsigned)
Office Visit Note  Patient: [name]             Date of Birth: 21-Jun-1966           MRN: [id number]             PCP: Fredirick Lathe, PA-C Referring: Allwardt, Randa Evens, PA-C Visit Date: 07/25/2022 Occupation: '@GUAROCC'$ @  Subjective:  Medication monitoring  History of Present Illness: [name] is a 56 y.o. female with history of seronegative rheumatoid arthritis and osteoarthritis.  Patient remains on Arava 10 mg 1 tablet by mouth daily and Plaquenil 200 mg 1 tablet by mouth twice daily.  She is tolerating combination therapy without any side effects.  Patient reports that she was recently diagnosed with COVID-19.  She states that she held her medications for about 10 days and has resumed therapy as prescribed.  She denies any signs or symptoms of a rheumatoid arthritis flare.  Overall she rates her rheumatoid arthritis pain and swelling a 2 out of 10 currently.  She denies any joint swelling currently.  She takes meloxicam very sparingly and Tylenol as needed for pain relief.  She has been practicing Pilates for exercise and is no longer kickboxing.   She denies any recurrent infections on the current treatment regimen.  She received the annual flu shot and RSV vaccine.    Activities of Daily Living:  Patient reports morning stiffness for several hours.   Patient Reports nocturnal pain.  Difficulty dressing/grooming: Denies Difficulty climbing stairs: Denies Difficulty getting out of chair: Denies Difficulty using hands for taps, buttons, cutlery, and/or writing: Reports  Review of Systems  Constitutional:  Negative for fatigue.  HENT:  Positive for mouth sores and mouth dryness. Negative for nose dryness.   Eyes:  Positive for dryness. Negative for pain and visual disturbance.  Respiratory:  Negative for cough, hemoptysis, shortness of breath and difficulty breathing.   Cardiovascular:  Negative for chest pain, palpitations, hypertension and swelling in legs/feet.   Gastrointestinal:  Negative for blood in stool, constipation and diarrhea.  Endocrine: Positive for increased urination.  Genitourinary:  Negative for painful urination and involuntary urination.  Musculoskeletal:  Positive for joint pain, joint pain, myalgias, muscle weakness, morning stiffness, muscle tenderness and myalgias. Negative for gait problem and joint swelling.  Skin:  Positive for hair loss. Negative for color change, pallor, rash, nodules/bumps, skin tightness, ulcers and sensitivity to sunlight.  Allergic/Immunologic: Negative for susceptible to infections.  Neurological:  Negative for dizziness, numbness, headaches and weakness.  Hematological:  Negative for swollen glands.  Psychiatric/Behavioral:  Positive for sleep disturbance. Negative for depressed mood. The patient is not nervous/anxious.     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   Primary osteoarthritis of both feet 10/09/2018   Hx of migraines 09/27/2018   History of gastroesophageal reflux (GERD) 09/27/2018   Palindromic rheumatism, 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   Moderate persistent asthma 07/05/2018   Hypothyroid 06/28/2018    Past Medical History:  Diagnosis Date   Arthritis    ra zero negative   Asthma    mild/moderate  persistent asthma   Complication of anesthesia    slow to awaken 25 yrs ago   Elevated liver function tests dr Neil Crouch pcp manages   for last year   Family history of breast cancer    Fibroid    Fractured rib    Frequent headaches    GERD (gastroesophageal reflux  disease)    Gluten intolerance    H/O seasonal allergies    History of chicken pox    History of COVID-19 07/2019   high fever sob, x 7 days all symptoms resolved   Injury of peroneal tendon of left foot, initial encounter    Left bundle branch block 02/2019   Dr.Paula Ross   Migraines    Pneumonia    PONV (postoperative nausea and vomiting)    ponv with several surgeries, likes scopolamine patch   Thyroid disease    Multiple benign nodules--thyroid removed    Family History  Problem Relation Age of Onset   Arthritis Mother    Cancer Mother 52       breast ca--dec age 83   Miscarriages / [place] Mother    Breast cancer Mother 74   Glaucoma Father    Diabetes Father    Heart attack Father    Heart disease Father        pacemaker    Hyperlipidemia Father    Hypertension Father    Stroke Father        x4   COPD Sister    Peripheral Artery Disease Sister    Asthma Brother    Birth defects Brother    Cancer Brother        skin   Hyperlipidemia Brother    Alcohol abuse Brother    Early death Brother        suicide   Mental illness Brother    Heart attack Maternal Grandmother    Heart disease Maternal Grandmother    Hyperlipidemia Maternal Grandmother    Stroke Maternal Grandmother    Alcohol abuse Paternal Grandmother    Cancer Paternal Grandmother        colon   Cancer Paternal Grandfather        breast   Heart attack Paternal Grandfather    Breast cancer Paternal Grandfather 9   Asthma Daughter    Asthma Son    Asthma Son    Breast cancer Maternal Aunt 29   Kidney cancer Paternal Aunt 66   Breast cancer Paternal Aunt 18   Throat cancer Paternal Uncle    Thyroid cancer Paternal Uncle    Breast cancer Cousin 71   Prostate cancer Cousin    Past Surgical History:  Procedure Laterality Date   ANTERIOR AND POSTERIOR REPAIR N/A 06/14/2020   Procedure: ANTERIOR (CYSTOCELE) AND POSTERIOR REPAIR (RECTOCELE);  Surgeon: Nunzio Cobbs, MD;  Location:  [facility];  Service: Gynecology;  Laterality: N/A;   BLADDER SUSPENSION N/A 06/14/2020   Procedure: TRANSVAGINAL TAPE (TVT) PROCEDURE/EXACT MIDURETHRAL SLING;  Surgeon: Nunzio Cobbs, MD;  Location: [facility];  Service: Gynecology;  Laterality: N/A;  EXACT MIDURETHRAL SLING   CARDIAC CATHETERIZATION  2009   results normal done in [place]   CHOLECYSTECTOMY  1995   laparoscopic   CYSTOSCOPY N/A 06/14/2020   Procedure: CYSTOSCOPY;  Surgeon: Nunzio Cobbs, MD;  Location: [facility];  Service: Gynecology;  Laterality: N/A;   HAMMER TOE SURGERY Bilateral 2009   HERNIA REPAIR  2015   Has abdominal wall mesh - [place], [place].  Laparoscopic incisional hernia repari with 7 x 9 inch Ventralight ST with Echo mesh   LAPAROSCOPIC HYSTERECTOMY  2007   Supracervical hysterectomy with cystosctopy [place], [place] partial   THYROID SURGERY  2013   total thyroidectomy due to 11 nodules   Social History   Social History Narrative   Right Handed   Lives in a two story home   Immunization History  Administered Date(s) Administered   Influenza,inj,Quad PF,6+ Mos 06/28/2018, 06/18/2019, 06/12/2022   Influenza-Unspecified 08/05/2020, 05/25/2021, 06/12/2022   Moderna Sars-Covid-2 Vaccination 01/27/2021   PFIZER(Purple Top)SARS-COV-2 Vaccination 12/08/2019, 01/05/2020, 08/05/2020   Pneumococcal Polysaccharide-23 10/22/2018   Td 06/12/2022   Tdap 06/28/2018, 06/12/2022   Zoster Recombinat (Shingrix) 04/15/2019, 07/16/2019     Objective: Vital Signs: BP (!) 142/81 (BP Location: Left Arm, Patient Position: Sitting, Cuff Size: Normal)   Pulse 96   Resp 15   Ht '5\' 7"'$  (1.702 m)   Wt 198 lb 3.2 oz (89.9 kg)   LMP  (LMP Unknown)   BMI 31.04 kg/m    Physical Exam Vitals and nursing note reviewed.  Constitutional:      Appearance: She is well-developed.  HENT:     Head: Normocephalic and atraumatic.  Eyes:     Conjunctiva/sclera:  Conjunctivae normal.  Cardiovascular:     Rate and Rhythm: Normal rate and regular rhythm.     Heart sounds: Normal heart sounds.  Pulmonary:     Effort: Pulmonary effort is normal.     Breath sounds: Normal breath sounds.  Abdominal:     General: Bowel sounds are normal.     Palpations: Abdomen is soft.  Musculoskeletal:     Cervical back: Normal range of motion.  Skin:    General: Skin is warm and dry.     Capillary Refill: Capillary refill takes less than 2 seconds.     Comments: KP noted on bilateral upper arms.   Neurological:     Mental Status: She is alert and oriented to person, place, and time.  Psychiatric:        Behavior: Behavior normal.       Musculoskeletal Exam: C-spine has good range of motion with no discomfort at this time.  No midline spinal tenderness or SI joint tenderness.  Shoulder joints, elbow joints, wrist joints, MCPs, PIPs, DIPs have good range of motion with no synovitis.  Complete fist formation bilaterally.  Hip joints have good range of motion with no groin pain.  Knee joints have good range of motion with no warmth or effusion.  Ankle joints have good range of motion with no tenderness or joint swelling.  CDAI Exam: CDAI Score: -- Patient Global: 2 mm; Provider Global: 2 mm Swollen: --; Tender: -- Joint Exam 07/25/2022   No joint exam has been documented for this visit   There is currently no information documented on the homunculus. Go to the Rheumatology activity and complete the homunculus joint exam.  Investigation: No additional findings.  Imaging: No results found.  Recent Labs: Lab Results  Component Value Date   WBC 5.6 04/19/2022   HGB 14.0 04/19/2022   PLT 315 04/19/2022   NA 141 04/19/2022   K 4.6 04/19/2022   CL 104 04/19/2022   CO2 30 04/19/2022   GLUCOSE 88 04/19/2022   BUN 13 04/19/2022   CREATININE 0.81 04/19/2022   BILITOT 0.7 04/19/2022   ALKPHOS 52 10/20/2021   AST 29 04/19/2022  ALT 25 04/19/2022   PROT  7.0 04/19/2022   ALBUMIN 4.7 10/20/2021   CALCIUM 10.2 04/19/2022   GFRAA 94 02/08/2021   QFTBGOLDPLUS NEGATIVE 12/20/2021    Speciality Comments: PLQ Eye Exam: 05/12/2021 WNL [facility] Follow up in 1 year.   Inadequate response to methotrexate, Enbrel, Humira, Orencia-hives everywhere Rinvoq-pneumonia Arava-11/2021  Procedures:  No procedures performed Allergies: Orencia [abatacept], Shellfish allergy, Strawberry (diagnostic), Gluten meal, Lortab [hydrocodone-acetaminophen], Morphine and related, and Augmentin [amoxicillin-pot clavulanate]    Assessment / Plan:     Visit Diagnoses: Rheumatoid arthritis of multiple sites with negative rheumatoid factor ([place]) - Diagnosed at [place] [facility].  Previous ultrasound examination performed showed synovitis in bilateral hands: She has no synovitis on examination today.  She has not had any signs or symptoms of a rheumatoid arthritis flare.  She has clinically been doing well taking Arava 10 mg 1 tablet by mouth daily and Plaquenil 200 mg 1 tablet by mouth twice daily.  She is tolerating combination therapy without any side effects.  She had a was recently diagnosed with COVID-19 and held both medications for 10 days until her symptoms have completely resolved.  She has resumed therapy as prescribed.  She did not notice any increased joint pain or inflammation during the In therapy.  She has been practicing Pilates for exercise and has not had any difficulty performing ADLs.  She will remain on the current treatment regimen.  She was advised to notify us if she develops increased joint pain or joint swelling.  She will follow-up in the office in 5 months or sooner if needed.  High risk medication use - Arava 10 mg 1 tablet by mouth daily and Plaquenil 200 mg 1 tablet by mouth twice daily.  CBC and CMP were drawn on 04/19/2022.  Orders for CBC and CMP were released today.  Her next lab work will be due in February and every 3  months to monitor for drug toxicity. Discussed the importance of holding [place] if she develops signs or symptoms of an infection and to resume once the infection has completely cleared. PLQ Eye Exam: 05/12/2021 WNL [facility] Follow up in 1 year.  Patient is overdue to update Plaquenil eye examination.  She was given a Plaquenil eye examination form to take with her to her upcoming appointment. - Plan: CBC with Differential/Platelet, COMPLETE METABOLIC PANEL WITH GFR  Hair loss: Unchanged.   Pain in both hands - X-rays of both hands were unremarkable on 09/27/2018.  No tenderness or synovitis noted on examination today.  Trochanteric bursitis of both hips: She has some tenderness over bilateral trochanteric bursa consistent with bursitis.  Encouraged the patient to perform stretching exercises daily.  She continues to practice Pilates.  Pain in left foot: Her left ankle joint pain has improved.  No synovitis noted.  She is wearing proper fitting shoes.   Primary osteoarthritis of both feet - X-rays of both feet from 09/27/2018 were reviewed today and are consistent with osteoarthritis and postsurgical changes.  No erosive changes were noted.  She takes meloxicam very sparingly for pain relief.   Other medical conditions are listed as follows:   Keratosis pilaris  Decreased cardiac ejection fraction  Hx of migraines  Dry scalp  Hot flashes  History of asthma  History of gastroesophageal reflux (GERD)  History of hypothyroidism -Patient requested to have TSH checked today and results forwarded to her PCP.  Plan: TSH  Postmenopausal - DEXA ordered September 22, 2021 showed T score -0.3.    Orders: Orders Placed This Encounter  Procedures   CBC with Differential/Platelet   COMPLETE METABOLIC PANEL WITH GFR   TSH   No orders of the defined types were placed in this encounter.   Follow-Up Instructions: Return in about 5 months (around 12/24/2022) for Rheumatoid arthritis,  Osteoarthritis.   Ofilia Neas, PA-C  Note - This record has been created using Dragon software.  Chart creation errors have been sought, but may not always  have been located. Such creation errors do not reflect on  the standard of medical care.

## 2022-07-25 ENCOUNTER — Ambulatory Visit: Payer: 59 | Attending: Physician Assistant | Admitting: Physician Assistant

## 2022-07-25 ENCOUNTER — Encounter: Payer: Self-pay | Admitting: Physician Assistant

## 2022-07-25 VITALS — BP 142/81 | HR 96 | Resp 15 | Ht 67.0 in | Wt 198.2 lb

## 2022-07-25 DIAGNOSIS — R232 Flushing: Secondary | ICD-10-CM

## 2022-07-25 DIAGNOSIS — M79641 Pain in right hand: Secondary | ICD-10-CM

## 2022-07-25 DIAGNOSIS — M19072 Primary osteoarthritis, left ankle and foot: Secondary | ICD-10-CM

## 2022-07-25 DIAGNOSIS — Z8719 Personal history of other diseases of the digestive system: Secondary | ICD-10-CM

## 2022-07-25 DIAGNOSIS — M0609 Rheumatoid arthritis without rheumatoid factor, multiple sites: Secondary | ICD-10-CM | POA: Diagnosis not present

## 2022-07-25 DIAGNOSIS — M545 Low back pain, unspecified: Secondary | ICD-10-CM

## 2022-07-25 DIAGNOSIS — L858 Other specified epidermal thickening: Secondary | ICD-10-CM

## 2022-07-25 DIAGNOSIS — Z79899 Other long term (current) drug therapy: Secondary | ICD-10-CM | POA: Diagnosis not present

## 2022-07-25 DIAGNOSIS — M19071 Primary osteoarthritis, right ankle and foot: Secondary | ICD-10-CM

## 2022-07-25 DIAGNOSIS — M79642 Pain in left hand: Secondary | ICD-10-CM

## 2022-07-25 DIAGNOSIS — M79672 Pain in left foot: Secondary | ICD-10-CM

## 2022-07-25 DIAGNOSIS — L659 Nonscarring hair loss, unspecified: Secondary | ICD-10-CM

## 2022-07-25 DIAGNOSIS — Z8709 Personal history of other diseases of the respiratory system: Secondary | ICD-10-CM

## 2022-07-25 DIAGNOSIS — R238 Other skin changes: Secondary | ICD-10-CM

## 2022-07-25 DIAGNOSIS — M7061 Trochanteric bursitis, right hip: Secondary | ICD-10-CM

## 2022-07-25 DIAGNOSIS — Z8639 Personal history of other endocrine, nutritional and metabolic disease: Secondary | ICD-10-CM

## 2022-07-25 DIAGNOSIS — R931 Abnormal findings on diagnostic imaging of heart and coronary circulation: Secondary | ICD-10-CM

## 2022-07-25 DIAGNOSIS — Z8669 Personal history of other diseases of the nervous system and sense organs: Secondary | ICD-10-CM

## 2022-07-25 DIAGNOSIS — M7062 Trochanteric bursitis, left hip: Secondary | ICD-10-CM

## 2022-07-25 DIAGNOSIS — Z780 Asymptomatic menopausal state: Secondary | ICD-10-CM

## 2022-07-25 NOTE — Patient Instructions (Signed)
Standing Labs We placed an order today for your standing lab work.   Please have your standing labs drawn in February and every 3 months   Please have your labs drawn 2 weeks prior to your appointment so that the provider can discuss your lab results at your appointment.  Please note that you may see your imaging and lab results in MyChart before we have reviewed them. We will contact you once all results are reviewed. Please allow our office up to 72 hours to thoroughly review all of the results before contacting the office for clarification of your results.  Lab hours are:   Monday through Thursday from 8:00 am -12:30 pm and 1:00 pm-5:00 pm and Friday from 8:00 am-12:00 pm.  Please be advised, all patients with office appointments requiring lab work will take precedent over walk-in lab work.   Labs are drawn by [facility]. Please bring your co-pay at the time of your lab draw.  You may receive a bill from [facility] for your lab work.  Please note if you are on Hydroxychloroquine and and an order has been placed for a Hydroxychloroquine level, you will need to have it drawn 4 hours or more after your last dose.  If you wish to have your labs drawn at another location, please call the office 24 hours in advance so we can fax the orders.  The office is located at [address], [address], [place],  [address] No appointment is necessary.    If you have any questions regarding directions or hours of operation,  please call [phone].   As a reminder, please drink plenty of water prior to coming for your lab work. Thanks!  

## 2022-07-26 LAB — CBC WITH DIFFERENTIAL/PLATELET
Absolute Monocytes: 533 cells/uL (ref 200–950)
Basophils Absolute: 60 cells/uL (ref 0–200)
Basophils Relative: 0.8 %
Eosinophils Absolute: 128 cells/uL (ref 15–500)
Eosinophils Relative: 1.7 %
HCT: 40.5 % (ref 35.0–45.0)
Hemoglobin: 13.6 g/dL (ref 11.7–15.5)
Lymphs Abs: 1890 cells/uL (ref 850–3900)
MCH: 28.1 pg (ref 27.0–33.0)
MCHC: 33.6 g/dL (ref 32.0–36.0)
MCV: 83.7 fL (ref 80.0–100.0)
MPV: 11.2 fL (ref 7.5–12.5)
Monocytes Relative: 7.1 %
Neutro Abs: 4890 cells/uL (ref 1500–7800)
Neutrophils Relative %: 65.2 %
Platelets: 282 10*3/uL (ref 140–400)
RBC: 4.84 10*6/uL (ref 3.80–5.10)
RDW: 13.3 % (ref 11.0–15.0)
Total Lymphocyte: 25.2 %
WBC: 7.5 10*3/uL (ref 3.8–10.8)

## 2022-07-26 LAB — COMPLETE METABOLIC PANEL WITH GFR
AG Ratio: 1.5 (calc) (ref 1.0–2.5)
ALT: 26 U/L (ref 6–29)
AST: 29 U/L (ref 10–35)
Albumin: 4 g/dL (ref 3.6–5.1)
Alkaline phosphatase (APISO): 65 U/L (ref 37–153)
BUN: 12 mg/dL (ref 7–25)
CO2: 29 mmol/L (ref 20–32)
Calcium: 9.3 mg/dL (ref 8.6–10.4)
Chloride: 105 mmol/L (ref 98–110)
Creat: 0.82 mg/dL (ref 0.50–1.03)
Globulin: 2.7 g/dL (calc) (ref 1.9–3.7)
Glucose, Bld: 88 mg/dL (ref 65–99)
Potassium: 4.4 mmol/L (ref 3.5–5.3)
Sodium: 141 mmol/L (ref 135–146)
Total Bilirubin: 0.7 mg/dL (ref 0.2–1.2)
Total Protein: 6.7 g/dL (ref 6.1–8.1)
eGFR: 85 mL/min/{1.73_m2} (ref >=60)

## 2022-07-26 LAB — TSH: TSH: 1.39 mIU/L

## 2022-07-26 NOTE — Progress Notes (Signed)
CBC and CMP WNL TSH WNL.  Please notify the patient and forward results to PCP as requested.

## 2022-08-08 ENCOUNTER — Other Ambulatory Visit: Payer: Self-pay | Admitting: Podiatry

## 2022-08-11 ENCOUNTER — Encounter: Payer: Self-pay | Admitting: Pharmacist

## 2022-08-11 NOTE — Telephone Encounter (Signed)
Error

## 2022-08-14 ENCOUNTER — Other Ambulatory Visit: Payer: Self-pay | Admitting: Physician Assistant

## 2022-08-14 DIAGNOSIS — M1230 Palindromic rheumatism, unspecified site: Secondary | ICD-10-CM

## 2022-08-14 NOTE — Telephone Encounter (Signed)
Next Visit: 10/26/2022  Last Visit: 07/25/2022  Labs: 07/25/2022 CBC and CMP WNL   Eye exam: 05/12/2021 WNL    Current Dose per office note 07/25/2022:  Plaquenil 200 mg 1 tablet by mouth twice daily   DT:HYHOOILNZV arthritis of multiple sites with negative rheumatoid factor   Last Fill: 06/16/2022  Left message to advise patient we need her update PLQ eye exam.   Okay to refill Plaquenil?

## 2022-08-29 ENCOUNTER — Encounter: Payer: Self-pay | Admitting: Pharmacist

## 2022-08-29 NOTE — Telephone Encounter (Signed)
Error

## 2022-09-09 ENCOUNTER — Other Ambulatory Visit: Payer: Self-pay | Admitting: Rheumatology

## 2022-09-09 DIAGNOSIS — M0609 Rheumatoid arthritis without rheumatoid factor, multiple sites: Secondary | ICD-10-CM

## 2022-09-09 DIAGNOSIS — Z79899 Other long term (current) drug therapy: Secondary | ICD-10-CM

## 2022-09-13 NOTE — Telephone Encounter (Signed)
Next Visit: 10/26/2022  Last Visit: 07/25/2022  Last Fill: 06/15/2022  DX:  Rheumatoid arthritis of multiple sites with negative rheumatoid factor    Current Dose per office note 07/25/2022: - Arava 10 mg 1 tablet by mouth daily   Labs: 07/25/2022  CBC and CMP WNL TSH WNL.  Please notify the patient and forward results to PCP as requested.   Okay to refill Arava?

## 2022-09-27 ENCOUNTER — Encounter: Payer: Self-pay | Admitting: Physician Assistant

## 2022-09-28 ENCOUNTER — Other Ambulatory Visit: Payer: Self-pay

## 2022-09-28 MED ORDER — MONTELUKAST SODIUM 10 MG PO TABS: ORAL_TABLET | ORAL | 3 refills | Status: AC

## 2022-10-15 ENCOUNTER — Other Ambulatory Visit: Payer: Self-pay | Admitting: Rheumatology

## 2022-10-15 DIAGNOSIS — M1230 Palindromic rheumatism, unspecified site: Secondary | ICD-10-CM

## 2022-10-16 NOTE — Progress Notes (Deleted)
Office Visit Note  Patient: [name]             Date of Birth: Feb 08, 1966           MRN: [id number]             PCP: Fredirick Lathe, PA-C Referring: Allwardt, Randa Evens, PA-C Visit Date: 10/26/2022 Occupation: '@GUAROCC'$ @  Subjective:  No chief complaint on file.   History of Present Illness: [name] is a 57 y.o. female ***     Activities of Daily Living:  Patient reports morning stiffness for *** {minute/hour:19697}.   Patient {ACTIONS;DENIES/REPORTS:21021675::"Denies"} nocturnal pain.  Difficulty dressing/grooming: {ACTIONS;DENIES/REPORTS:21021675::"Denies"} Difficulty climbing stairs: {ACTIONS;DENIES/REPORTS:21021675::"Denies"} Difficulty getting out of chair: {ACTIONS;DENIES/REPORTS:21021675::"Denies"} Difficulty using hands for taps, buttons, cutlery, and/or writing: {ACTIONS;DENIES/REPORTS:21021675::"Denies"}  No Rheumatology ROS completed.   PMFS History:  Patient Active Problem List   Diagnosis Date Noted   Rheumatoid arthritis of multiple sites with negative rheumatoid factor ([place]) 12/22/2021   Status post surgery 06/14/2020   Genetic testing 02/18/2020   Family history of breast cancer    High risk medication use 10/30/2019   Left bundle branch block 04/28/2019   Right wrist pain 03/13/2019   Hamstring strain, left, initial encounter 02/20/2019   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   Primary osteoarthritis of both feet 10/09/2018   Hx of migraines 09/27/2018   History of gastroesophageal reflux (GERD) 09/27/2018   Palindromic rheumatism, 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   Moderate persistent asthma 07/05/2018   Hypothyroid 06/28/2018    Past Medical History:  Diagnosis Date   Arthritis    ra zero negative   Asthma    mild/moderate persistent asthma   Complication of anesthesia    slow to awaken 25 yrs ago   Elevated liver function tests dr Neil Crouch pcp manages   for last year   Family history of breast cancer    Fibroid    Fractured  rib    Frequent headaches    GERD (gastroesophageal reflux disease)    Gluten intolerance    H/O seasonal allergies    History of chicken pox    History of COVID-19 07/2019   high fever sob, x 7 days all symptoms resolved   Injury of peroneal tendon of left foot, initial encounter    Left bundle branch block 02/2019   Dr.Paula Ross   Migraines    Pneumonia    PONV (postoperative nausea and vomiting)    ponv with several surgeries, likes scopolamine patch   Thyroid disease    Multiple benign nodules--thyroid removed    Family History  Problem Relation Age of Onset   Arthritis Mother    Cancer Mother 68       breast ca--dec age 42   Miscarriages / [place] Mother    Breast cancer Mother 60   Glaucoma Father    Diabetes Father    Heart attack Father    Heart disease Father        pacemaker    Hyperlipidemia Father    Hypertension Father    Stroke Father        x4   COPD Sister    Peripheral Artery Disease Sister    Asthma Brother    Birth defects Brother    Cancer Brother        skin   Hyperlipidemia Brother    Alcohol abuse Brother    Early death Brother        suicide   Mental illness Brother    Heart  attack Maternal Grandmother    Heart disease Maternal Grandmother    Hyperlipidemia Maternal Grandmother    Stroke Maternal Grandmother    Alcohol abuse Paternal Grandmother    Cancer Paternal Grandmother        colon   Cancer Paternal Grandfather        breast   Heart attack Paternal Grandfather    Breast cancer Paternal Grandfather 46   Asthma Daughter    Asthma Son    Asthma Son    Breast cancer Maternal Aunt 60   Kidney cancer Paternal Aunt 32   Breast cancer Paternal Aunt 74   Throat cancer Paternal Uncle    Thyroid cancer Paternal Uncle    Breast cancer Cousin 49   Prostate cancer Cousin    Past Surgical History:  Procedure Laterality Date   ANTERIOR AND POSTERIOR REPAIR N/A 06/14/2020   Procedure: ANTERIOR (CYSTOCELE) AND POSTERIOR REPAIR  (RECTOCELE);  Surgeon: Nunzio Cobbs, MD;  Location: [facility];  Service: Gynecology;  Laterality: N/A;   BLADDER SUSPENSION N/A 06/14/2020   Procedure: TRANSVAGINAL TAPE (TVT) PROCEDURE/EXACT MIDURETHRAL SLING;  Surgeon: Nunzio Cobbs, MD;  Location: [facility];  Service: Gynecology;  Laterality: N/A;  EXACT MIDURETHRAL SLING   CARDIAC CATHETERIZATION  2009   results normal done in [place]   CHOLECYSTECTOMY  1995   laparoscopic   CYSTOSCOPY N/A 06/14/2020   Procedure: CYSTOSCOPY;  Surgeon: Nunzio Cobbs, MD;  Location: [facility];  Service: Gynecology;  Laterality: N/A;   HAMMER TOE SURGERY Bilateral 2009   HERNIA REPAIR  2015   Has abdominal wall mesh - [place], [place].  Laparoscopic incisional hernia repari with 7 x 9 inch Ventralight ST with Echo mesh   LAPAROSCOPIC HYSTERECTOMY  2007   Supracervical hysterectomy with cystosctopy [place] partial   THYROID SURGERY  2013   total thyroidectomy due to 11 nodules   Social History   Social History Narrative   Right Handed   Lives in a two story home   Immunization History  Administered Date(s) Administered   Influenza,inj,Quad PF,6+ Mos 06/28/2018, 06/18/2019, 06/12/2022   Influenza-Unspecified 08/05/2020, 05/25/2021, 06/12/2022   Moderna Sars-Covid-2 Vaccination 01/27/2021   PFIZER(Purple Top)SARS-COV-2 Vaccination 12/08/2019, 01/05/2020, 08/05/2020   Pneumococcal Polysaccharide-23 10/22/2018   Td 06/12/2022   Tdap 06/28/2018, 06/12/2022   Zoster Recombinat (Shingrix) 04/15/2019, 07/16/2019     Objective: Vital Signs: LMP  (LMP Unknown)    Physical Exam   Musculoskeletal Exam: ***  CDAI Exam: CDAI Score: -- Patient Global: --; Provider Global: -- Swollen: --; Tender: -- Joint Exam 10/26/2022   No joint exam has been documented for this visit   There is currently no information documented on the homunculus. Go to the Rheumatology  activity and complete the homunculus joint exam.  Investigation: No additional findings.  Imaging: No results found.  Recent Labs: Lab Results  Component Value Date   WBC 7.5 07/25/2022   HGB 13.6 07/25/2022   PLT 282 07/25/2022   NA 141 07/25/2022   K 4.4 07/25/2022   CL 105 07/25/2022   CO2 29 07/25/2022   GLUCOSE 88 07/25/2022   BUN 12 07/25/2022   CREATININE 0.82 07/25/2022   BILITOT 0.7 07/25/2022   ALKPHOS 52 10/20/2021   AST 29 07/25/2022   ALT 26 07/25/2022   PROT 6.7 07/25/2022   ALBUMIN 4.7 10/20/2021   CALCIUM 9.3 07/25/2022   GFRAA 94 02/08/2021   QFTBGOLDPLUS  NEGATIVE 12/20/2021    Speciality Comments: PLQ Eye Exam: 09/21/2022 WNL [facility] Follow up in 1 year.   Inadequate response to methotrexate, Enbrel, Humira, Orencia-hives everywhere Rinvoq-pneumonia Arava-11/2021  Procedures:  No procedures performed Allergies: Orencia [abatacept], Shellfish allergy, Strawberry (diagnostic), Gluten meal, Lortab [hydrocodone-acetaminophen], Morphine and related, and Augmentin [amoxicillin-pot clavulanate]   Assessment / Plan:     Visit Diagnoses: No diagnosis found.  Orders: No orders of the defined types were placed in this encounter.  No orders of the defined types were placed in this encounter.   Face-to-face time spent with patient was *** minutes. Greater than 50% of time was spent in counseling and coordination of care.  Follow-Up Instructions: No follow-ups on file.   Earnestine Mealing, CMA  Note - This record has been created using [employer], [employer].  Chart creation errors have been sought, but may not always  have been located. Such creation errors do not reflect on  the standard of medical care.

## 2022-10-16 NOTE — Telephone Encounter (Signed)
Next Visit: 10/26/2022  Last Visit: 07/25/2022  Labs: 07/25/2022 CBC and CMP WNL TSH WNL.  Eye exam: 05/12/2021   Current Dose per office note on 07/25/2022: Plaquenil 200 mg 1 tablet by mouth twice daily.    AY:OKHTXHFSFS arthritis of multiple sites with negative rheumatoid factor   Last Fill: 08/14/2022  Patient states she updated the eye exam approximately 1 week ago at [facility]. I have called to obtain eye exam.   Okay to refill Plaquenil?

## 2022-10-21 ENCOUNTER — Other Ambulatory Visit: Payer: Self-pay | Admitting: Physician Assistant

## 2022-10-24 NOTE — Progress Notes (Unsigned)
Office Visit Note  Patient: [name]             Date of Birth: Jun 11, 1966           MRN: [id number]             PCP: Amy Lathe, PA-C Referring: Allwardt, Amy Evens, PA-C Visit Date: 11/01/2022 Occupation: '@GUAROCC'$ @  Subjective:  Aching and joint stiffness  History of Present Illness: [name] is a 57 y.o. female with history of seronegative rheumatoid arthritis and osteoarthritis.  Patient is currently taking Arava 10 mg 1 tablet by mouth daily and Plaquenil 200 mg 1 tablet by mouth twice daily.  She has been tolerate adding combination therapy without any side effects.  She has not missed any doses recently.  She denies any recent or recurrent infections.  Patient reports that she has been experiencing some increased stiffness and achiness which she attributes to cooler weather temperatures.  Her discomfort has been most severe in the right wrist and right knee joint.  She has noticed intermittent swelling but overall her symptoms have been tolerable compared to previous flares.    Activities of Daily Living:  Patient reports morning stiffness for Most of the day x last week .   Patient Denies nocturnal pain.  Difficulty dressing/grooming: Denies Difficulty climbing stairs: Denies- tries to avoid Difficulty getting out of chair: Denies Difficulty using hands for taps, buttons, cutlery, and/or writing: Reports  Review of Systems  Constitutional:  Positive for fatigue.  HENT: Negative.  Negative for mouth sores and mouth dryness.   Eyes:  Positive for dryness.  Respiratory:  Positive for shortness of breath.   Cardiovascular: Negative.  Negative for chest pain and palpitations.  Gastrointestinal: Negative.  Negative for blood in stool, constipation and diarrhea.  Endocrine: Positive for increased urination.  Genitourinary:  Positive for involuntary urination.  Musculoskeletal:  Positive for joint pain, joint pain, joint swelling, morning stiffness and muscle  tenderness. Negative for gait problem, myalgias, muscle weakness and myalgias.  Skin:  Negative for color change, rash, hair loss and sensitivity to sunlight.  Allergic/Immunologic: Positive for susceptible to infections.  Neurological: Negative.  Negative for dizziness and headaches.  Hematological: Negative.  Negative for swollen glands.  Psychiatric/Behavioral:  Positive for sleep disturbance. Negative for depressed mood. The patient is not nervous/anxious.     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   Primary osteoarthritis of both feet 10/09/2018   Hx of migraines 09/27/2018   History of gastroesophageal reflux (GERD) 09/27/2018   Palindromic rheumatism, 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   Moderate persistent asthma 07/05/2018   Hypothyroid 06/28/2018    Past Medical History:  Diagnosis Date   Arthritis    ra zero negative   Asthma    mild/moderate persistent asthma   Complication of anesthesia    slow to awaken 25 yrs ago   Elevated liver function tests dr Neil Crouch pcp manages   for last year   Family history of breast cancer    Fibroid    Fractured rib    Frequent headaches  GERD (gastroesophageal reflux disease)    Gluten intolerance    H/O seasonal allergies    History of chicken pox    History of COVID-19 07/2019   high fever sob, x 7 days all symptoms resolved   Injury of peroneal tendon of left foot, initial encounter    Left bundle branch block 02/2019   Dr.Paula Ross   Migraines    Pneumonia    PONV (postoperative nausea and vomiting)    ponv  with several surgeries, likes scopolamine patch   Thyroid disease    Multiple benign nodules--thyroid removed    Family History  Problem Relation Age of Onset   Arthritis Mother    Cancer Mother 67       breast ca--dec age 61   Miscarriages / [place] Mother    Breast cancer Mother 31   Glaucoma Father    Diabetes Father    Heart attack Father    Heart disease Father        pacemaker    Hyperlipidemia Father    Hypertension Father    Stroke Father        x4   COPD Sister    Peripheral Artery Disease Sister    Asthma Brother    Birth defects Brother    Cancer Brother        skin   Hyperlipidemia Brother    Alcohol abuse Brother    Early death Brother        suicide   Mental illness Brother    Breast cancer Maternal Aunt 47   Kidney cancer Paternal Aunt 54   Breast cancer Paternal Aunt 64   Throat cancer Paternal Uncle    Thyroid cancer Paternal Uncle    Heart attack Maternal Grandmother    Heart disease Maternal Grandmother    Hyperlipidemia Maternal Grandmother    Stroke Maternal Grandmother    Alcohol abuse Paternal Grandmother    Cancer Paternal Grandmother        colon   Cancer Paternal Grandfather        breast   Heart attack Paternal Grandfather    Breast cancer Paternal Grandfather 77   Asthma Daughter    Ehlers-Danlos syndrome Daughter    Fibromyalgia Daughter    Asthma Son    Asthma Son    Breast cancer Cousin 20   Prostate cancer Cousin    Past Surgical History:  Procedure Laterality Date   ANTERIOR AND POSTERIOR REPAIR N/A 06/14/2020   Procedure: ANTERIOR (CYSTOCELE) AND POSTERIOR REPAIR (RECTOCELE);  Surgeon: Nunzio Cobbs, MD;  [facility]  Service: Gynecology;  Laterality: N/A;   BLADDER SUSPENSION N/A 06/14/2020   Procedure: TRANSVAGINAL TAPE (TVT) PROCEDURE/EXACT MIDURETHRAL SLING;  Surgeon: Nunzio Cobbs, MD;  [facility];  Service: Gynecology;  Laterality: N/A;   EXACT MIDURETHRAL SLING   CARDIAC CATHETERIZATION  2009   results normal done in [place]   CHOLECYSTECTOMY  1995   laparoscopic   CYSTOSCOPY N/A 06/14/2020   Procedure: CYSTOSCOPY;  Surgeon: Nunzio Cobbs, MD;  Location: [facility];  Service: Gynecology;  Laterality: N/A;   HAMMER TOE SURGERY Bilateral 2009   HERNIA REPAIR  2015   Has abdominal wall mesh - [place], [place].  Laparoscopic incisional hernia repari with 7 x 9 inch Ventralight ST with Echo mesh   LAPAROSCOPIC HYSTERECTOMY  2007   Supracervical hysterectomy with cystosctopy - [place], [place] partial   THYROID  SURGERY  2013   total thyroidectomy due to 11 nodules   Social History   Social History Narrative   Right Handed   Lives in a two story home   Immunization History  Administered Date(s) Administered   Influenza,inj,Quad PF,6+ Mos 06/28/2018, 06/18/2019, 06/12/2022   Influenza-Unspecified 08/05/2020, 05/25/2021, 06/12/2022   Moderna Sars-Covid-2 Vaccination 01/27/2021   PFIZER(Purple Top)SARS-COV-2 Vaccination 12/08/2019, 01/05/2020, 08/05/2020   Pneumococcal Polysaccharide-23 10/22/2018   Td 06/12/2022   Tdap 06/28/2018, 06/12/2022   Zoster Recombinat (Shingrix) 04/15/2019, 07/16/2019     Objective: Vital Signs: BP 124/85 (BP Location: Left Arm, Patient Position: Sitting, Cuff Size: Large)   Pulse 94   Resp 16   Ht '5\' 7"'$  (1.702 m)   Wt 201 lb 9.6 oz (91.4 kg)   LMP  (LMP Unknown)   BMI 31.58 kg/m    Physical Exam Vitals and nursing note reviewed.  Constitutional:      Appearance: She is well-developed.  HENT:     Head: Normocephalic and atraumatic.  Eyes:     Conjunctiva/sclera: Conjunctivae normal.  Cardiovascular:     Rate and Rhythm: Normal rate and regular rhythm.     Heart sounds: Normal heart sounds.  Pulmonary:     Effort: Pulmonary effort is normal.     Breath sounds: Normal breath sounds.  Abdominal:     General: Bowel sounds are normal.     Palpations:  Abdomen is soft.  Musculoskeletal:     Cervical back: Normal range of motion.  Skin:    General: Skin is warm and dry.     Capillary Refill: Capillary refill takes less than 2 seconds.  Neurological:     Mental Status: She is alert and oriented to person, place, and time.  Psychiatric:        Behavior: Behavior normal.      Musculoskeletal Exam: C-spine has good range of motion.  No midline spinal tenderness or SI joint tenderness.  Shoulder joints and elbow joints have good range of motion.  Some stiffness in the right wrist but no synovitis noted.  No tenderness or synovitis over MCP, PIP, DIP joints.  Complete fist formation bilaterally.  Hip joints have good range of motion with no groin pain.  No tenderness over the trochanteric bursa at this time.  Right knee has mild warmth compared to the left knee but no effusions noted.  Ankle joints have good range of motion with no joint tenderness.  Some tenderness along the Achilles tendon but no plantar fasciitis noted.  No tenderness or synovitis over MTP joints.  CDAI Exam: CDAI Score: -- Patient Global: 2 mm; Provider Global: 2 mm Swollen: --; Tender: -- Joint Exam 11/01/2022   No joint exam has been documented for this visit   There is currently no information documented on the homunculus. Go to the Rheumatology activity and complete the homunculus joint exam.  Investigation: No additional findings.  Imaging: No results found.  Recent Labs: Lab Results  Component Value Date   WBC 7.5 07/25/2022   HGB 13.6 07/25/2022   PLT 282 07/25/2022   NA 141 07/25/2022   K 4.4 07/25/2022   CL 105 07/25/2022   CO2 29 07/25/2022   GLUCOSE 88 07/25/2022   BUN 12 07/25/2022   CREATININE 0.82 07/25/2022   BILITOT 0.7 07/25/2022   ALKPHOS 52 10/20/2021   AST 29 07/25/2022   ALT 26 07/25/2022   PROT 6.7 07/25/2022   ALBUMIN 4.7 10/20/2021   CALCIUM 9.3 07/25/2022  GFRAA 94 02/08/2021   QFTBGOLDPLUS NEGATIVE 12/20/2021     Speciality Comments: PLQ Eye Exam: 09/21/2022 WNL [facility] Follow up in 1 year.   Inadequate response to methotrexate, Enbrel, Humira, Orencia-hives everywhere Rinvoq-pneumonia Arava-11/2021  Procedures:  No procedures performed Allergies: Orencia [abatacept], Shellfish allergy, Strawberry (diagnostic), Gluten meal, Lortab [hydrocodone-acetaminophen], Morphine and related, and Augmentin [amoxicillin-pot clavulanate]     Assessment / Plan:     Visit Diagnoses: Rheumatoid arthritis of multiple sites with negative rheumatoid factor ([place]) - Diagnosed at [place] [facility].  Previous ultrasound examination performed showed synovitis in bilateral hands: She has no synovitis on examination today.  Overall her rheumatoid arthritis has been well-controlled taking Arava 10 mg 1 tablet by mouth daily and Plaquenil 200 mg 1 tablet by mouth twice daily.  She has been tolerating combination therapy without any side effects and has not missed any doses recently.  She has not had any recent or recurrent infections.  She is been experiencing some increased arthralgias and joint stiffness which she attributes to cooler weather temperatures.  Her symptoms have been most prominent in the right wrist and right knee joint.  Mild warmth in the right knee compared to the left knee noted today but no effusion was apparent.  No synovitis of the right wrist was noted.  The patient will remain on the current treatment regimen.  She was advised to notify us if her symptoms persist or worsen at which time she could return for a right knee joint cortisone injection if needed.  She will follow-up in the office in 5 months or sooner if needed.  High risk medication use - Arava 10 mg 1 tablet by mouth daily and Plaquenil 200 mg 1 tablet by mouth twice daily.  CBC and CMP WNL on 07/25/22.  Orders for CBC and CMP were released today.  Her next lab work will be due in May and every 3 months to monitor for  drug toxicity. No recent or recurrent infections.  Discussed the importance of holding [place] if she develops signs or symptoms of an infection and to resume once the infection has completely cleared. PLQ Eye Exam: 09/21/2022 WNL [facility] Follow up in 1 year.  - Plan: CBC with Differential/Platelet, COMPLETE METABOLIC PANEL WITH GFR  Pain in both hands - X-rays of both hands were unremarkable on 09/27/2018.  Patient is experiencing some tenderness and stiffness in the right wrist joint but no obvious synovitis was noted today.  Trochanteric bursitis of both hips: Currently asymptomatic.  Primary osteoarthritis of both feet - X-rays of both feet from 09/27/2018 were reviewed today and are consistent with osteoarthritis and postsurgical changes.  No erosive changes were noted.  No tenderness or synovitis over MTP joints.  Some tenderness along the Achilles tendon of the left ankle.  No evidence of plantar fasciitis currently.  Other medical conditions are listed as follows:  Keratosis pilaris  Hx of migraines  Decreased cardiac ejection fraction  Dry scalp  Hair loss  Hot flashes  History of gastroesophageal reflux (GERD)  History of asthma  History of hypothyroidism  Postmenopausal - DEXA ordered September 22, 2021 showed T score -0.3.  Orders: Orders Placed This Encounter  Procedures   CBC with Differential/Platelet   COMPLETE METABOLIC PANEL WITH GFR   No orders of the defined types were placed in this encounter.   Follow-Up Instructions: Return in about 5 months (around 04/01/2023) for Rheumatoid arthritis.   Ofilia Neas, PA-C  Note - This record has been created using [employer].  Chart creation errors have been sought, but may not always  have been located. Such creation errors do not reflect on  the standard of medical care.

## 2022-10-26 ENCOUNTER — Ambulatory Visit: Payer: 59 | Admitting: Physician Assistant

## 2022-10-26 DIAGNOSIS — R232 Flushing: Secondary | ICD-10-CM

## 2022-10-26 DIAGNOSIS — M79641 Pain in right hand: Secondary | ICD-10-CM

## 2022-10-26 DIAGNOSIS — R238 Other skin changes: Secondary | ICD-10-CM

## 2022-10-26 DIAGNOSIS — Z8709 Personal history of other diseases of the respiratory system: Secondary | ICD-10-CM

## 2022-10-26 DIAGNOSIS — R931 Abnormal findings on diagnostic imaging of heart and coronary circulation: Secondary | ICD-10-CM

## 2022-10-26 DIAGNOSIS — Z780 Asymptomatic menopausal state: Secondary | ICD-10-CM

## 2022-10-26 DIAGNOSIS — M79672 Pain in left foot: Secondary | ICD-10-CM

## 2022-10-26 DIAGNOSIS — Z8639 Personal history of other endocrine, nutritional and metabolic disease: Secondary | ICD-10-CM

## 2022-10-26 DIAGNOSIS — L659 Nonscarring hair loss, unspecified: Secondary | ICD-10-CM

## 2022-10-26 DIAGNOSIS — M7061 Trochanteric bursitis, right hip: Secondary | ICD-10-CM

## 2022-10-26 DIAGNOSIS — L858 Other specified epidermal thickening: Secondary | ICD-10-CM

## 2022-10-26 DIAGNOSIS — Z8669 Personal history of other diseases of the nervous system and sense organs: Secondary | ICD-10-CM

## 2022-10-26 DIAGNOSIS — Z79899 Other long term (current) drug therapy: Secondary | ICD-10-CM

## 2022-10-26 DIAGNOSIS — M19071 Primary osteoarthritis, right ankle and foot: Secondary | ICD-10-CM

## 2022-10-26 DIAGNOSIS — M0609 Rheumatoid arthritis without rheumatoid factor, multiple sites: Secondary | ICD-10-CM

## 2022-10-26 DIAGNOSIS — Z8719 Personal history of other diseases of the digestive system: Secondary | ICD-10-CM

## 2022-10-31 NOTE — Progress Notes (Signed)
57 y.o. [id number] Married Caucasian female here for annual exam.  Wants to discuss vaginal dryness and lack of libido  Tried vitamin E capsules for vaginal dryness, and this did not work well.   Still with hot flashes.  Was on Gabapentin for hot flashes and arthritis.  Stopped as it was not helping.  Effexor decreased her libido.   Saw urology for overactive bladder.  Started Mybetriq which is helping.   She will do pelvic floor therapy.   No urinary incontinence with cough, laugh, or sneeze.   Having stress due to her daughter's health issues.  Dysautonomia.   PCP:  Dr. Brand Males  No LMP recorded (lmp unknown). Patient has had a hysterectomy.           Sexually active: Yes.    The current method of family planning is status post hysterectomy.    Exercising: Yes.   Strength training, walking Smoker:  no  Health Maintenance: Pap:  10/16/19 neg: HR HPV neg History of abnormal Pap:  no MMG:  11/21/19 Breast Density Category B, BI-RADS CATEGORY 1 Neg Colonoscopy:  02/15/22 cologuard BMD:   09/22/21  Result  normal TDaP:  06/12/22 Gardasil:   no HIV: 10/23/18 NR Hep C: 10/23/18 Neg Screening Labs:  PCP   reports that she has never smoked. She has never been exposed to tobacco smoke. She has never used smokeless tobacco. She reports current alcohol use. She reports that she does not use drugs.  Past Medical History:  Diagnosis Date   Arthritis    ra zero negative   Asthma    mild/moderate persistent asthma   Complication of anesthesia    slow to awaken 25 yrs ago   Elevated liver function tests dr Neil Crouch pcp manages   for last year   Family history of breast cancer    Fibroid    Fractured rib    Frequent headaches    GERD (gastroesophageal reflux disease)    Gluten intolerance    H/O seasonal allergies    History of chicken pox    History of COVID-19 07/2019   high fever sob, x 7 days all symptoms resolved   Injury of peroneal tendon of left foot, initial encounter     Left bundle branch block 02/2019   Dr.Paula Ross   Migraines    Pneumonia    PONV (postoperative nausea and vomiting)    ponv with several surgeries, likes scopolamine patch   Thyroid disease    Multiple benign nodules--thyroid removed    Past Surgical History:  Procedure Laterality Date   ANTERIOR AND POSTERIOR REPAIR N/A 06/14/2020   Procedure: ANTERIOR (CYSTOCELE) AND POSTERIOR REPAIR (RECTOCELE);  Surgeon: Nunzio Cobbs, MD;  Location: [facility];  Service: Gynecology;  Laterality: N/A;   BLADDER SUSPENSION N/A 06/14/2020   Procedure: TRANSVAGINAL TAPE (TVT) PROCEDURE/EXACT MIDURETHRAL SLING;  Surgeon: Nunzio Cobbs, MD;  Location: [facility];  Service: Gynecology;  Laterality: N/A;  EXACT MIDURETHRAL SLING   CARDIAC CATHETERIZATION  2009   results normal done in [place]   CHOLECYSTECTOMY  1995   laparoscopic   CYSTOSCOPY N/A 06/14/2020   Procedure: CYSTOSCOPY;  Surgeon: Nunzio Cobbs, MD;  Location: [facility];  Service: Gynecology;  Laterality: N/A;   HAMMER TOE SURGERY Bilateral 2009   HERNIA REPAIR  2015   Has abdominal wall mesh - [place], [place].  Laparoscopic incisional hernia repari with 7 x 9 inch  Ventralight ST with Echo mesh   LAPAROSCOPIC HYSTERECTOMY  2007   Supracervical hysterectomy with cystosctopy [place], [place] partial   THYROID SURGERY  2013   total thyroidectomy due to 11 nodules    Current Outpatient Medications  Medication Sig Dispense Refill   acetaminophen (TYLENOL) 500 MG tablet Take 500 mg by mouth every 6 (six) hours as needed.     ADVAIR DISKUS 250-50 MCG/DOSE AEPB INHALE 1 PUFF BY MOUTH TWICE A DAY 180 each 3   ADVAIR HFA 115-21 MCG/ACT inhaler INHALE 2 PUFFS INTO THE LUNGS TWICE A DAY 36 each 3   albuterol (VENTOLIN HFA) 108 (90 Base) MCG/ACT inhaler TAKE 2 PUFFS BY MOUTH EVERY 6 HOURS AS NEEDED FOR WHEEZE OR SHORTNESS OF BREATH 25.5 each 5   cholecalciferol (VITAMIN  D) 400 units TABS tablet Take 5,000 Units by mouth.      [facility] ALLERGY RELIEF D24 180-240 MG 24 hr tablet TAKE 1 TABLET BY MOUTH EVERY DAY 15 tablet 7   cyclobenzaprine (FLEXERIL) 10 MG tablet Daily at bed time as needed 30 tablet 0   hydroxychloroquine (PLAQUENIL) 200 MG tablet TAKE 1 TABLET BY MOUTH TWICE A DAY 60 tablet 0   leflunomide (ARAVA) 10 MG tablet TAKE 1 TABLET BY MOUTH EVERY DAY 90 tablet 0   levothyroxine (SYNTHROID) 150 MCG tablet TAKE 1 TABLET BY MOUTH DAILY BEFORE BREAKFAST. 90 tablet 0   Magnesium 400 MG CAPS Take by mouth.     meloxicam (MOBIC) 15 MG tablet TAKE 1 TABLET (15 MG TOTAL) BY MOUTH DAILY. 30 tablet 0   montelukast (SINGULAIR) 10 MG tablet TAKE 1 TABLET BY MOUTH EVERYDAY AT BEDTIME 90 tablet 3   rosuvastatin (CRESTOR) 5 MG tablet Take 0.5 tablets (2.5 mg total) by mouth every other day. 90 tablet 0   TURMERIC PO Take by mouth daily.     No current facility-administered medications for this visit.    Family History  Problem Relation Age of Onset   Arthritis Mother    Cancer Mother 79       breast ca--dec age 36   Miscarriages / [place] Mother    Breast cancer Mother 42   Glaucoma Father    Diabetes Father    Heart attack Father    Heart disease Father        pacemaker    Hyperlipidemia Father    Hypertension Father    Stroke Father        x4   COPD Sister    Peripheral Artery Disease Sister    Asthma Brother    Birth defects Brother    Cancer Brother        skin   Hyperlipidemia Brother    Alcohol abuse Brother    Early death Brother        suicide   Mental illness Brother    Breast cancer Maternal Aunt 73   Kidney cancer Paternal Aunt 54   Breast cancer Paternal Aunt 41   Throat cancer Paternal Uncle    Thyroid cancer Paternal Uncle    Heart attack Maternal Grandmother    Heart disease Maternal Grandmother    Hyperlipidemia Maternal Grandmother    Stroke Maternal Grandmother    Alcohol abuse Paternal Grandmother    Cancer Paternal  Grandmother        colon   Cancer Paternal Grandfather        breast   Heart attack Paternal Grandfather    Breast cancer Paternal Grandfather 29  Asthma Daughter    Ehlers-Danlos syndrome Daughter    Fibromyalgia Daughter    Asthma Son    Asthma Son    Breast cancer Cousin 59   Prostate cancer Cousin     Review of Systems  All other systems reviewed and are negative.   Exam:   BP 112/74 (BP Location: Left Arm, Patient Position: Sitting, Cuff Size: Normal)   Pulse 85   Ht 5' 5.5" (1.664 m)   Wt 199 lb (90.3 kg)   LMP  (LMP Unknown)   SpO2 98%   BMI 32.61 kg/m     General appearance: alert, cooperative and appears stated age Head: normocephalic, without obvious abnormality, atraumatic Neck: no adenopathy, supple, symmetrical, trachea midline and thyroid normal to inspection and palpation Lungs: clear to auscultation bilaterally Breasts: normal appearance, no masses or tenderness, No nipple retraction or dimpling, No nipple discharge or bleeding, No axillary adenopathy Heart: regular rate and rhythm Abdomen: soft, non-tender; no masses, no organomegaly Extremities: extremities normal, atraumatic, no cyanosis or edema Skin: skin color, texture, turgor normal. No rashes or lesions Lymph nodes: cervical, supraclavicular, and axillary nodes normal. Neurologic: grossly normal  Pelvic: External genitalia:  no lesions              No abnormal inguinal nodes palpated.              Urethra:  normal appearing urethra with no masses, tenderness or lesions              Bartholins and Skenes: normal                 Vagina: normal appearing vagina with normal color and discharge, no lesions              Cervix: no lesions              Pap taken: no Bimanual Exam:  Uterus:  absent              Adnexa: no mass, fullness, tenderness              Rectal exam: yes.  Confirms.              Anus:  normal sphincter tone, no lesions  Chaperone was present for exam:  Sharee Pimple,  RN  Assessment:   Well woman visit with gynecologic exam. Status post laparoscopic supracervical hysterectomy. Status post ant and post repair with TVT, cystoscopy.  Post op hematoma.    FH breast cancer.  Genetic testing negative.  Lifetime risk of breast cancer 14.9%.  Menopausal symptoms. Vaginal atrophy. Decreased libido.  Overactive bladder.  Rheumatoid arthritis.   LBBB.  Possible CAD.  Followed by cardiology.   Plan: Mammogram screening discussed.  She will update.  Self breast awareness reviewed. Pap and HR HPV 2026.  Guidelines for Calcium, Vitamin D, regular exercise program including cardiovascular and weight bearing exercise. We discussed cooking oils for vaginal lubrication. She will do pelvic floor therapy.  We discussed Wellbutrin for decreased libido.  No Rx given.  We discussed bladder irritants.   Follow up annually and prn.   After visit summary provided.

## 2022-11-01 ENCOUNTER — Encounter: Payer: Self-pay | Admitting: Physician Assistant

## 2022-11-01 ENCOUNTER — Ambulatory Visit: Payer: 59 | Attending: Physician Assistant | Admitting: Physician Assistant

## 2022-11-01 VITALS — BP 124/85 | HR 94 | Resp 16 | Ht 67.0 in | Wt 201.6 lb

## 2022-11-01 DIAGNOSIS — Z780 Asymptomatic menopausal state: Secondary | ICD-10-CM

## 2022-11-01 DIAGNOSIS — Z79899 Other long term (current) drug therapy: Secondary | ICD-10-CM | POA: Diagnosis not present

## 2022-11-01 DIAGNOSIS — L659 Nonscarring hair loss, unspecified: Secondary | ICD-10-CM

## 2022-11-01 DIAGNOSIS — M19072 Primary osteoarthritis, left ankle and foot: Secondary | ICD-10-CM

## 2022-11-01 DIAGNOSIS — R232 Flushing: Secondary | ICD-10-CM

## 2022-11-01 DIAGNOSIS — M79641 Pain in right hand: Secondary | ICD-10-CM

## 2022-11-01 DIAGNOSIS — Z8709 Personal history of other diseases of the respiratory system: Secondary | ICD-10-CM

## 2022-11-01 DIAGNOSIS — M79642 Pain in left hand: Secondary | ICD-10-CM

## 2022-11-01 DIAGNOSIS — R238 Other skin changes: Secondary | ICD-10-CM

## 2022-11-01 DIAGNOSIS — M19071 Primary osteoarthritis, right ankle and foot: Secondary | ICD-10-CM

## 2022-11-01 DIAGNOSIS — M7062 Trochanteric bursitis, left hip: Secondary | ICD-10-CM

## 2022-11-01 DIAGNOSIS — Z8669 Personal history of other diseases of the nervous system and sense organs: Secondary | ICD-10-CM

## 2022-11-01 DIAGNOSIS — Z8719 Personal history of other diseases of the digestive system: Secondary | ICD-10-CM

## 2022-11-01 DIAGNOSIS — M0609 Rheumatoid arthritis without rheumatoid factor, multiple sites: Secondary | ICD-10-CM | POA: Diagnosis not present

## 2022-11-01 DIAGNOSIS — L858 Other specified epidermal thickening: Secondary | ICD-10-CM

## 2022-11-01 DIAGNOSIS — M7061 Trochanteric bursitis, right hip: Secondary | ICD-10-CM

## 2022-11-01 DIAGNOSIS — Z8639 Personal history of other endocrine, nutritional and metabolic disease: Secondary | ICD-10-CM

## 2022-11-01 DIAGNOSIS — M79672 Pain in left foot: Secondary | ICD-10-CM

## 2022-11-01 DIAGNOSIS — R931 Abnormal findings on diagnostic imaging of heart and coronary circulation: Secondary | ICD-10-CM

## 2022-11-01 NOTE — Patient Instructions (Signed)
Standing Labs We placed an order today for your standing lab work.  Please have your standing labs drawn in May and every 3 months   Please have your labs drawn 2 weeks prior to your appointment so that the provider can discuss your lab results at your appointment.  Please note that you may see your imaging and lab results in [place] before we have reviewed them. We will contact you once all results are reviewed. Please allow our office up to 72 hours to thoroughly review all of the results before contacting the office for clarification of your results.  Lab hours are:   Monday through Thursday from 8:00 am -12:30 pm and 1:00 pm-5:00 pm and Friday from 8:00 am-12:00 pm.  Please be advised, all patients with office appointments requiring lab work will take precedent over walk-in lab work.   Labs are drawn by [facility]. Please bring your co-pay at the time of your lab draw.  You may receive a bill from [facility] for your lab work.  Please note if you are on Hydroxychloroquine and and an order has been placed for a Hydroxychloroquine level, you will need to have it drawn 4 hours or more after your last dose.  If you wish to have your labs drawn at another location, please call the office 24 hours in advance so we can fax the orders.  The office is located at [address] [place], [place], [place] [address] No appointment is necessary.    If you have any questions regarding directions or hours of operation,  please call [phone].   As a reminder, please drink plenty of water prior to coming for your lab work. Thanks!

## 2022-11-01 NOTE — Progress Notes (Signed)
CBC WNL

## 2022-11-02 LAB — CBC WITH DIFFERENTIAL/PLATELET
Absolute Monocytes: 403 cells/uL (ref 200–950)
Basophils Absolute: 78 cells/uL (ref 0–200)
Basophils Relative: 1.4 %
Eosinophils Absolute: 465 cells/uL (ref 15–500)
Eosinophils Relative: 8.3 %
HCT: 41.5 % (ref 35.0–45.0)
Hemoglobin: 13.8 g/dL (ref 11.7–15.5)
Lymphs Abs: 1775 cells/uL (ref 850–3900)
MCH: 28 pg (ref 27.0–33.0)
MCHC: 33.3 g/dL (ref 32.0–36.0)
MCV: 84.2 fL (ref 80.0–100.0)
MPV: 11.9 fL (ref 7.5–12.5)
Monocytes Relative: 7.2 %
Neutro Abs: 2878 cells/uL (ref 1500–7800)
Neutrophils Relative %: 51.4 %
Platelets: 288 10*3/uL (ref 140–400)
RBC: 4.93 10*6/uL (ref 3.80–5.10)
RDW: 12.5 % (ref 11.0–15.0)
Total Lymphocyte: 31.7 %
WBC: 5.6 10*3/uL (ref 3.8–10.8)

## 2022-11-02 LAB — COMPLETE METABOLIC PANEL WITH GFR
AG Ratio: 1.8 (calc) (ref 1.0–2.5)
ALT: 22 U/L (ref 6–29)
AST: 29 U/L (ref 10–35)
Albumin: 4.4 g/dL (ref 3.6–5.1)
Alkaline phosphatase (APISO): 65 U/L (ref 37–153)
BUN: 12 mg/dL (ref 7–25)
CO2: 28 mmol/L (ref 20–32)
Calcium: 9.2 mg/dL (ref 8.6–10.4)
Chloride: 106 mmol/L (ref 98–110)
Creat: 0.78 mg/dL (ref 0.50–1.03)
Globulin: 2.5 g/dL (calc) (ref 1.9–3.7)
Glucose, Bld: 70 mg/dL (ref 65–99)
Potassium: 3.9 mmol/L (ref 3.5–5.3)
Sodium: 143 mmol/L (ref 135–146)
Total Bilirubin: 0.5 mg/dL (ref 0.2–1.2)
Total Protein: 6.9 g/dL (ref 6.1–8.1)
eGFR: 90 mL/min/{1.73_m2} (ref >=60)

## 2022-11-02 NOTE — Progress Notes (Signed)
CMP WNL

## 2022-11-12 ENCOUNTER — Other Ambulatory Visit: Payer: Self-pay | Admitting: Podiatry

## 2022-11-14 ENCOUNTER — Encounter: Payer: Self-pay | Admitting: Obstetrics and Gynecology

## 2022-11-14 ENCOUNTER — Ambulatory Visit (INDEPENDENT_AMBULATORY_CARE_PROVIDER_SITE_OTHER): Payer: 59 | Admitting: Obstetrics and Gynecology

## 2022-11-14 VITALS — BP 112/74 | HR 85 | Ht 65.5 in | Wt 199.0 lb

## 2022-11-14 DIAGNOSIS — Z01419 Encounter for gynecological examination (general) (routine) without abnormal findings: Secondary | ICD-10-CM

## 2022-11-14 DIAGNOSIS — N951 Menopausal and female climacteric states: Secondary | ICD-10-CM

## 2022-11-14 NOTE — Patient Instructions (Signed)

## 2022-11-15 ENCOUNTER — Other Ambulatory Visit: Payer: Self-pay | Admitting: Physician Assistant

## 2022-11-15 DIAGNOSIS — M1230 Palindromic rheumatism, unspecified site: Secondary | ICD-10-CM

## 2022-11-15 NOTE — Telephone Encounter (Signed)
Next Visit: 04/01/2022  Last Visit: 11/01/2022  Labs: 11/01/2022 CBC and CMP WNL  Eye exam: 09/21/2022 WNL   Current Dose per office note 11/01/2022: Plaquenil 200 mg 1 tablet by mouth twice daily.   [id number] arthritis of multiple sites with negative rheumatoid factor   Last Fill: 10/16/2022 30 day supply  Okay to refill Plaquenil?

## 2022-12-04 ENCOUNTER — Other Ambulatory Visit: Payer: Self-pay | Admitting: Physician Assistant

## 2022-12-04 DIAGNOSIS — M0609 Rheumatoid arthritis without rheumatoid factor, multiple sites: Secondary | ICD-10-CM

## 2022-12-04 DIAGNOSIS — Z79899 Other long term (current) drug therapy: Secondary | ICD-10-CM

## 2022-12-04 NOTE — Telephone Encounter (Signed)
Next Visit: 04/02/2023  Last Visit: 11/01/2022  Last Fill: 09/13/2022  DX: Rheumatoid arthritis of multiple sites with negative rheumatoid factor   Current Dose per office note 11/01/2022: Arava 10 mg 1 tablet by mouth daily   Labs: 11/01/2022 CBC WNL, CMP WNL   Okay to refill Arava?

## 2022-12-06 IMAGING — DX DG CHEST 2V
2 series · 2 of 2 positions shown · non-contrast
Comparison: October 20, 2021

CLINICAL DATA: Shortness of breath.

EXAM:
CHEST - 2 VIEW

[chest pa]
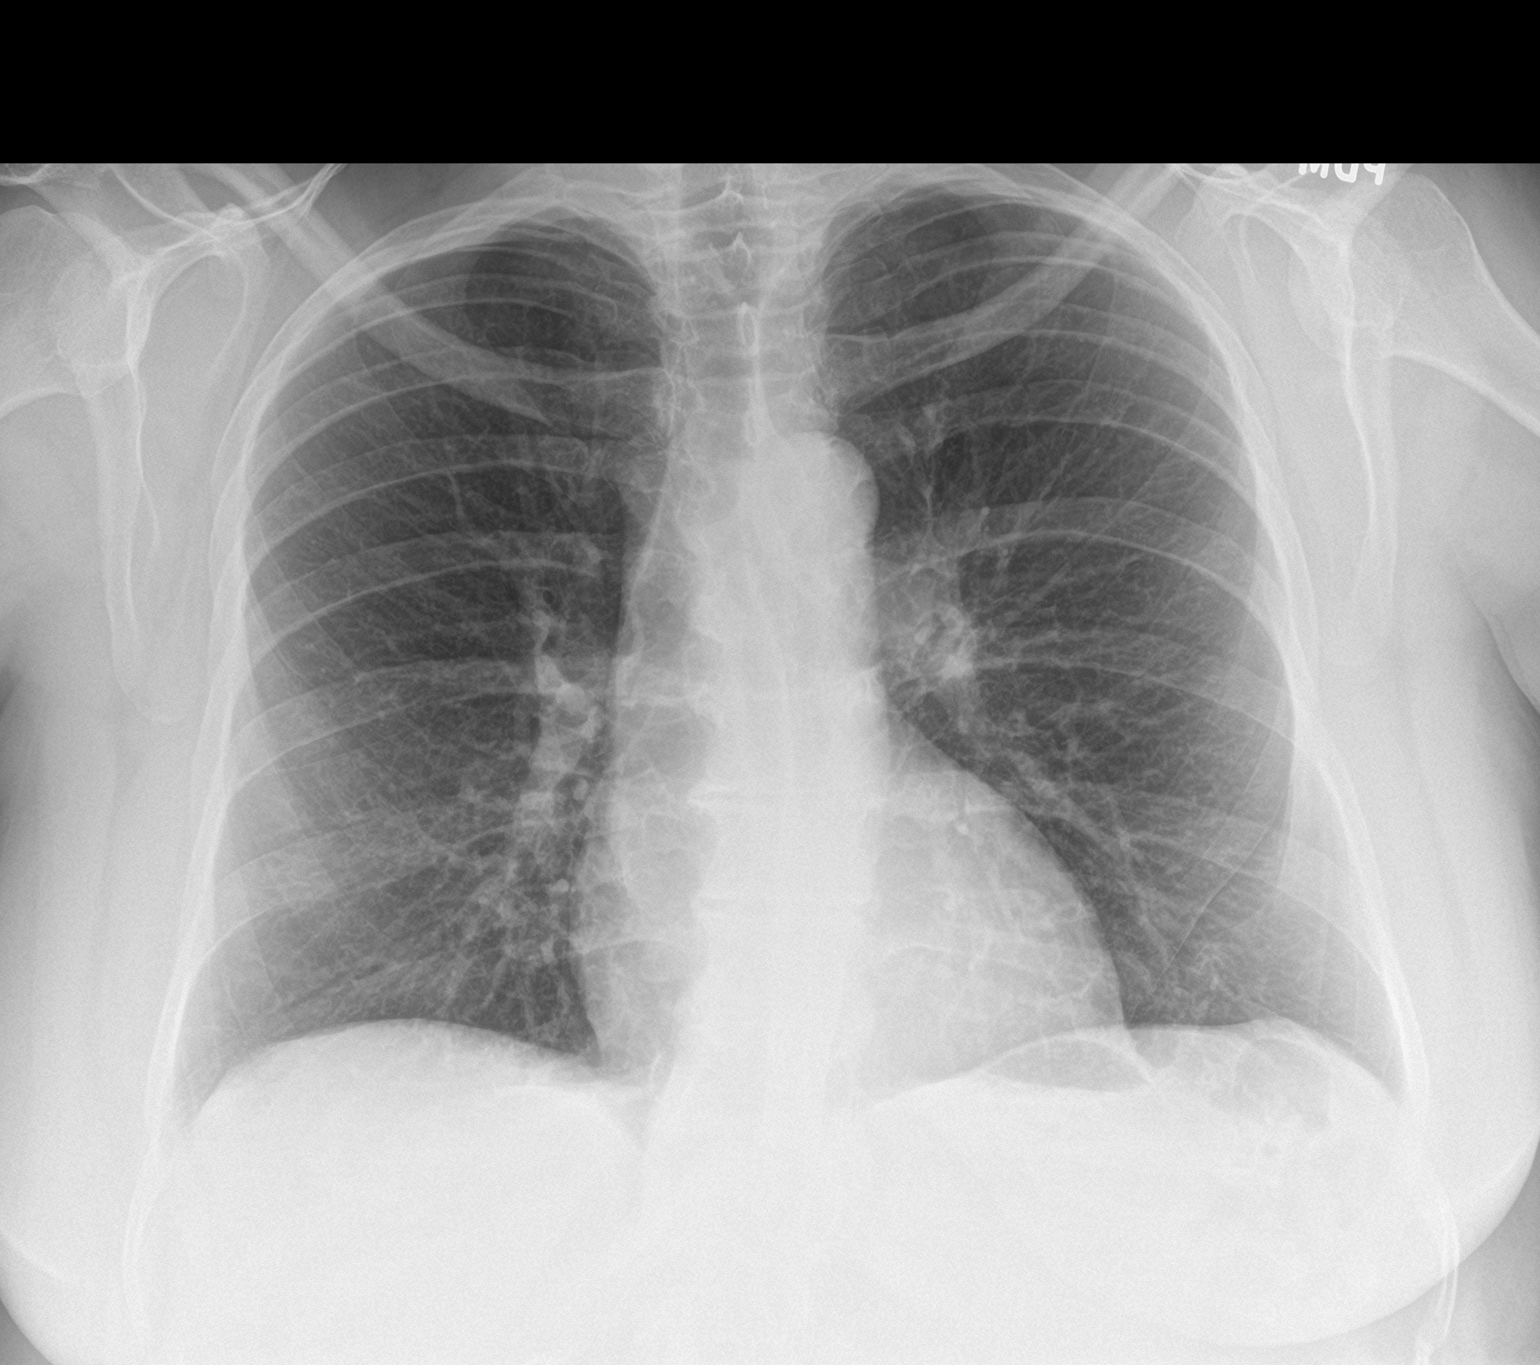

[chest lat]
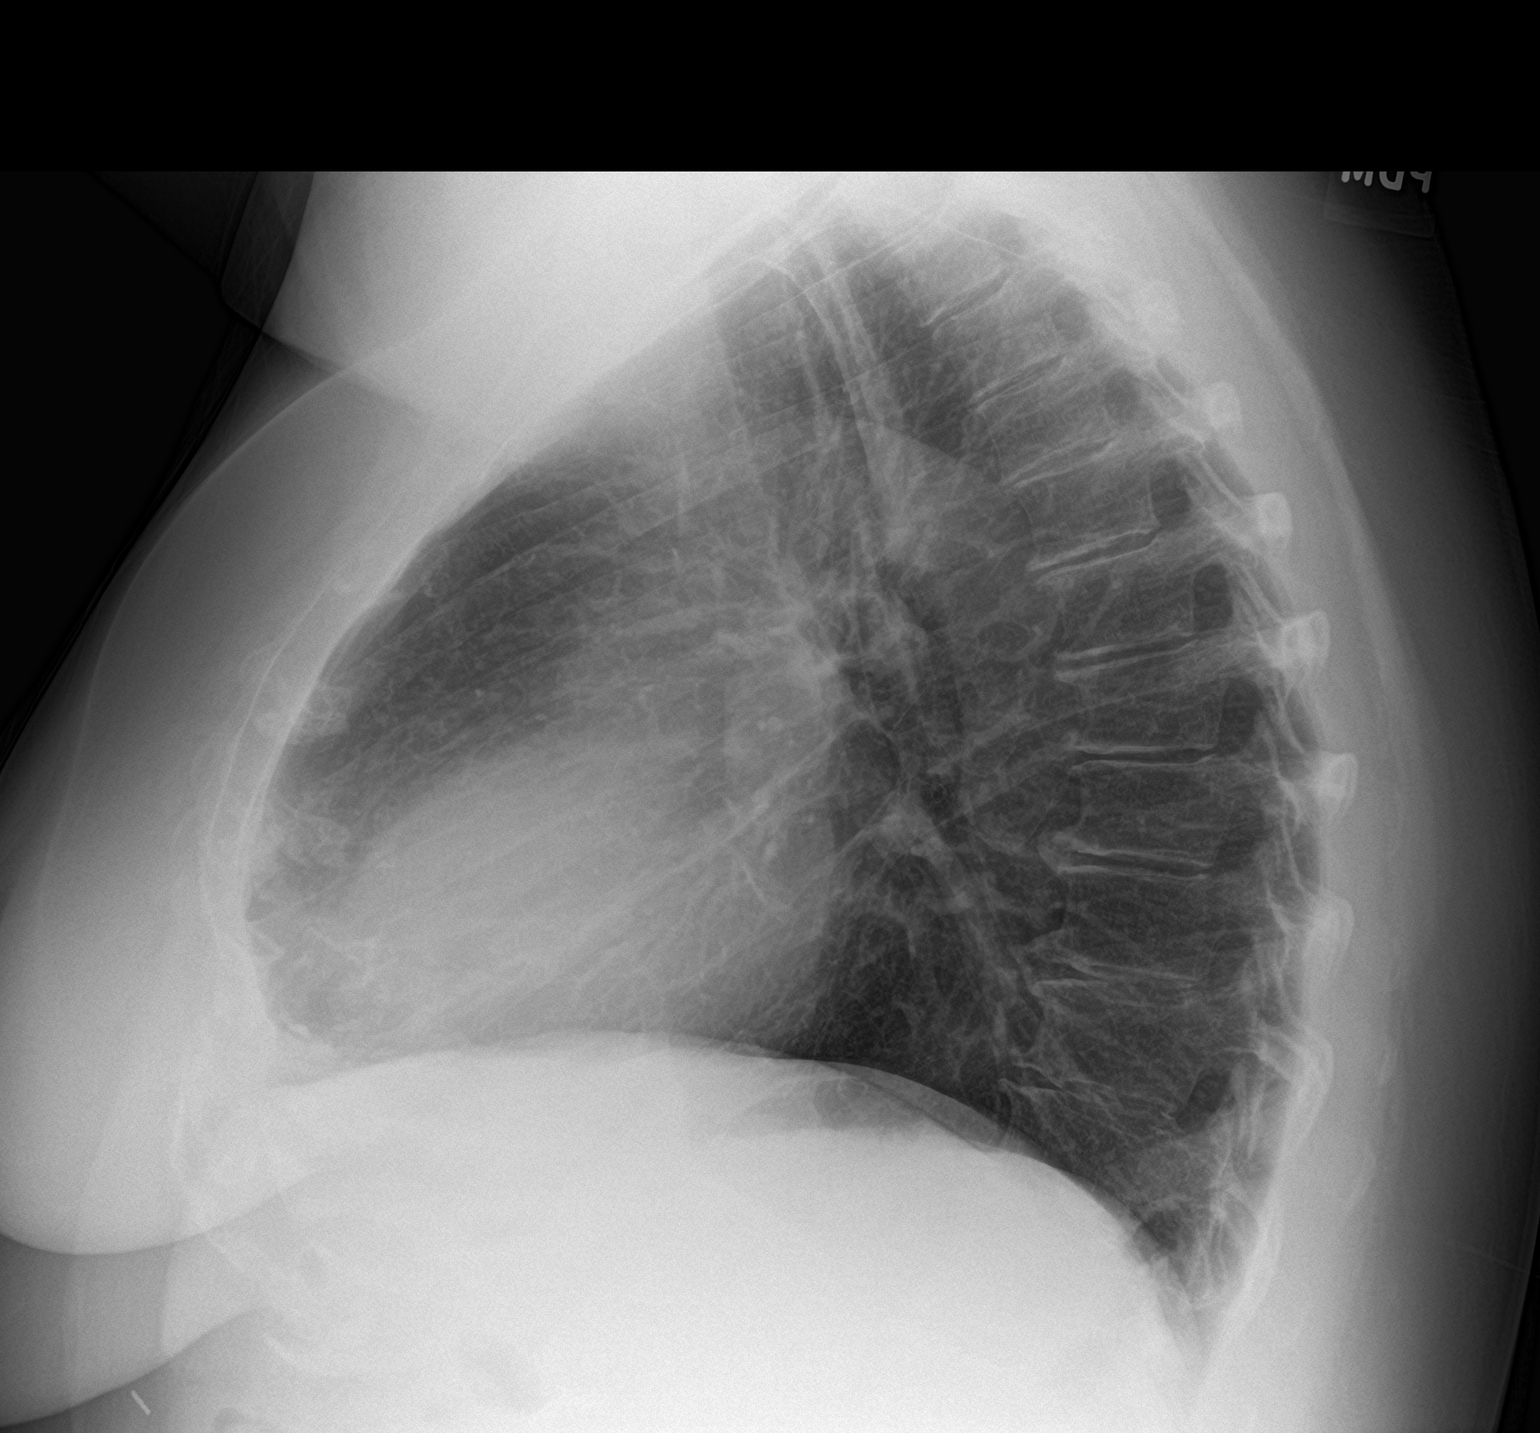

[2 of 2 positions shown; findings below may reference images not displayed]

FINDINGS: The heart size and mediastinal contours are within normal limits.
Both lungs are clear. The visualized skeletal structures are
unremarkable.
IMPRESSION: No active cardiopulmonary disease.

## 2022-12-18 NOTE — Progress Notes (Unsigned)
    Benito Mccreedy D.[place] Phone: [phone]   Assessment and Plan:     There are no diagnoses linked to this encounter.  ***   Pertinent previous records reviewed include ***   Follow Up: ***     Subjective:   I, Iola Turri, am serving as a Education administrator for Doctor Glennon Mac  Chief Complaint: left hand pain   HPI:   12/19/2022 Patient is a 57 year old female complaining of left hand pain. Patient states  Relevant Historical Information: ***  Additional pertinent review of systems negative.   Current Outpatient Medications:    acetaminophen (TYLENOL) 500 MG tablet, Take 500 mg by mouth every 6 (six) hours as needed., Disp: , Rfl:    ADVAIR DISKUS 250-50 MCG/DOSE AEPB, INHALE 1 PUFF BY MOUTH TWICE A DAY, Disp: 180 each, Rfl: 3   ADVAIR HFA 115-21 MCG/ACT inhaler, INHALE 2 PUFFS INTO THE LUNGS TWICE A DAY, Disp: 36 each, Rfl: 3   albuterol (VENTOLIN HFA) 108 (90 Base) MCG/ACT inhaler, TAKE 2 PUFFS BY MOUTH EVERY 6 HOURS AS NEEDED FOR WHEEZE OR SHORTNESS OF BREATH, Disp: 25.5 each, Rfl: 4   cholecalciferol (VITAMIN D) 400 units TABS tablet, Take 5,000 Units by mouth. , Disp: , Rfl:    [facility] ALLERGY RELIEF D24 180-240 MG 24 hr tablet, TAKE 1 TABLET BY MOUTH EVERY DAY, Disp: 15 tablet, Rfl: 7   cyclobenzaprine (FLEXERIL) 10 MG tablet, Daily at bed time as needed, Disp: 30 tablet, Rfl: 0   hydroxychloroquine (PLAQUENIL) 200 MG tablet, TAKE 1 TABLET BY MOUTH TWICE A DAY, Disp: 180 tablet, Rfl: 0   leflunomide (ARAVA) 10 MG tablet, TAKE 1 TABLET BY MOUTH EVERY DAY, Disp: 90 tablet, Rfl: 0   levothyroxine (SYNTHROID) 150 MCG tablet, TAKE 1 TABLET BY MOUTH DAILY BEFORE BREAKFAST., Disp: 90 tablet, Rfl: 0   Magnesium 400 MG CAPS, Take by mouth., Disp: , Rfl:    meloxicam (MOBIC) 15 MG tablet, TAKE 1 TABLET (15 MG TOTAL) BY MOUTH DAILY., Disp: 30 tablet, Rfl: 0   montelukast (SINGULAIR) 10 MG tablet, TAKE 1  TABLET BY MOUTH EVERYDAY AT BEDTIME, Disp: 90 tablet, Rfl: 3   rosuvastatin (CRESTOR) 5 MG tablet, Take 0.5 tablets (2.5 mg total) by mouth every other day., Disp: 90 tablet, Rfl: 0   TURMERIC PO, Take by mouth daily., Disp: , Rfl:    Objective:     There were no vitals filed for this visit.    There is no height or weight on file to calculate BMI.    Physical Exam:    ***   Electronically signed by:  Benito Mccreedy D.[facility] 7:22 AM 12/18/22

## 2022-12-19 ENCOUNTER — Other Ambulatory Visit: Payer: Self-pay

## 2022-12-19 ENCOUNTER — Ambulatory Visit (INDEPENDENT_AMBULATORY_CARE_PROVIDER_SITE_OTHER): Payer: 59 | Admitting: Sports Medicine

## 2022-12-19 VITALS — BP 120/80 | HR 74 | Ht 65.0 in | Wt 197.0 lb

## 2022-12-19 DIAGNOSIS — M79643 Pain in unspecified hand: Secondary | ICD-10-CM

## 2022-12-19 DIAGNOSIS — M67442 Ganglion, left hand: Secondary | ICD-10-CM

## 2022-12-19 MED ORDER — MELOXICAM 15 MG PO TABS: 15.0000 mg | ORAL_TABLET | Freq: Every day | ORAL | 0 refills | Status: AC

## 2022-12-19 NOTE — Patient Instructions (Addendum)
Good to see you  - Start meloxicam 15 mg daily x2 weeks.  If still having pain after 2 weeks, complete 3rd-week of meloxicam. May use remaining meloxicam as needed once daily for pain control.  Do not to use additional NSAIDs while taking meloxicam.  May use Tylenol [phone] mg 2 to 3 times a day for breakthrough pain. Try to rest hand as much as possible for the next two weeks  Knee HEP  As needed follow up ,if no improvement 3-4 weeks

## 2023-01-20 ENCOUNTER — Other Ambulatory Visit: Payer: Self-pay | Admitting: Sports Medicine

## 2023-01-28 ENCOUNTER — Other Ambulatory Visit: Payer: Self-pay | Admitting: Internal Medicine

## 2023-01-29 ENCOUNTER — Other Ambulatory Visit: Payer: Self-pay | Admitting: Physician Assistant

## 2023-02-18 ENCOUNTER — Other Ambulatory Visit: Payer: Self-pay | Admitting: Physician Assistant

## 2023-02-18 DIAGNOSIS — M1230 Palindromic rheumatism, unspecified site: Secondary | ICD-10-CM

## 2023-02-19 ENCOUNTER — Other Ambulatory Visit: Payer: Self-pay | Admitting: Obstetrics and Gynecology

## 2023-02-19 ENCOUNTER — Other Ambulatory Visit: Payer: Self-pay | Admitting: Physician Assistant

## 2023-02-19 DIAGNOSIS — Z0000 Encounter for general adult medical examination without abnormal findings: Secondary | ICD-10-CM

## 2023-02-19 NOTE — Telephone Encounter (Signed)
Last Fill: 11/15/2022  Eye exam: 09/21/2022 WNL    Labs: 11/01/2022 CMP WNL CBC WNL   Next Visit: 04/02/2023  Last Visit: 11/01/2022  DX: Rheumatoid arthritis of multiple sites with negative rheumatoid factor   Current Dose per office note 11/01/2022:  Plaquenil 200 mg 1 tablet by mouth twice daily.   Okay to refill Plaquenil?

## 2023-02-20 NOTE — Telephone Encounter (Signed)
LAST OV: 07/12/22  NEXT OV: NONE SCHEDULED  LAST FILLED: 01/10/22  QUANTITY: 30 W/ 0 REFILLS

## 2023-02-21 ENCOUNTER — Ambulatory Visit: Payer: 59

## 2023-02-22 ENCOUNTER — Ambulatory Visit
Admission: RE | Admit: 2023-02-22 | Discharge: 2023-02-22 | Disposition: A | Discharge status: Home / Self Care | Payer: 59 | Source: Ambulatory Visit | Attending: Obstetrics and Gynecology | Admitting: Obstetrics and Gynecology

## 2023-02-22 DIAGNOSIS — Z0000 Encounter for general adult medical examination without abnormal findings: Secondary | ICD-10-CM

## 2023-02-28 ENCOUNTER — Other Ambulatory Visit: Payer: Self-pay | Admitting: Physician Assistant

## 2023-02-28 DIAGNOSIS — M0609 Rheumatoid arthritis without rheumatoid factor, multiple sites: Secondary | ICD-10-CM

## 2023-02-28 DIAGNOSIS — Z79899 Other long term (current) drug therapy: Secondary | ICD-10-CM

## 2023-02-28 NOTE — Telephone Encounter (Signed)
Last Fill: 12/04/2022  Labs: 11/01/2022 CBC WNL CMP WNL   Next Visit: 04/02/2023  Last Visit: 11/01/2022  DX: Rheumatoid arthritis of multiple sites with negative rheumatoid factor   Current Dose per office note 11/01/2022:  Arava 10 mg 1 tablet by mouth daily   Patient advised she is due to update labs. Patient will update labs this week.   Okay to refill Arava ?

## 2023-03-01 ENCOUNTER — Other Ambulatory Visit: Payer: Self-pay | Admitting: *Deleted

## 2023-03-01 DIAGNOSIS — Z79899 Other long term (current) drug therapy: Secondary | ICD-10-CM

## 2023-03-01 LAB — CBC WITH DIFFERENTIAL/PLATELET
Basophils Relative: 1.1 %
MCH: 27.8 pg (ref 27.0–33.0)
MPV: 11.7 fL (ref 7.5–12.5)
Monocytes Relative: 8.5 %
Neutro Abs: 3186 cells/uL (ref 1500–7800)
RBC: 5.29 10*6/uL — ABNORMAL HIGH (ref 3.80–5.10)
Total Lymphocyte: 28.4 %
WBC: 5.4 10*3/uL (ref 3.8–10.8)

## 2023-03-02 LAB — CBC WITH DIFFERENTIAL/PLATELET
Absolute Monocytes: 459 cells/uL (ref 200–950)
Basophils Absolute: 59 cells/uL (ref 0–200)
Eosinophils Absolute: 162 cells/uL (ref 15–500)
Eosinophils Relative: 3 %
HCT: 44.2 % (ref 35.0–45.0)
Hemoglobin: 14.7 g/dL (ref 11.7–15.5)
Lymphs Abs: 1534 cells/uL (ref 850–3900)
MCHC: 33.3 g/dL (ref 32.0–36.0)
MCV: 83.6 fL (ref 80.0–100.0)
Neutrophils Relative %: 59 %
Platelets: 283 10*3/uL (ref 140–400)
RDW: 13.4 % (ref 11.0–15.0)

## 2023-03-02 LAB — COMPLETE METABOLIC PANEL WITH GFR
AG Ratio: 2 (calc) (ref 1.0–2.5)
ALT: 26 U/L (ref 6–29)
AST: 28 U/L (ref 10–35)
Albumin: 4.7 g/dL (ref 3.6–5.1)
Alkaline phosphatase (APISO): 49 U/L (ref 37–153)
BUN: 14 mg/dL (ref 7–25)
CO2: 27 mmol/L (ref 20–32)
Calcium: 10.4 mg/dL (ref 8.6–10.4)
Chloride: 104 mmol/L (ref 98–110)
Creat: 0.85 mg/dL (ref 0.50–1.03)
Globulin: 2.3 g/dL (calc) (ref 1.9–3.7)
Glucose, Bld: 78 mg/dL (ref 65–99)
Potassium: 4.5 mmol/L (ref 3.5–5.3)
Sodium: 142 mmol/L (ref 135–146)
Total Bilirubin: 0.7 mg/dL (ref 0.2–1.2)
Total Protein: 7 g/dL (ref 6.1–8.1)
eGFR: 81 mL/min/{1.73_m2} (ref >=60)

## 2023-03-02 NOTE — Progress Notes (Signed)
CMP WNL RBC count is borderline elevated-rest of CBC WNL.

## 2023-03-19 NOTE — Progress Notes (Signed)
Office Visit Note  Patient: [name]             Date of Birth: [birth year]           MRN: [id number]             PCP: Bary Leriche, PA-C Referring: Allwardt, Crist Infante, PA-C Visit Date: 04/02/2023 Occupation: @GUAROCC @  Subjective:  Left knee pain   History of Present Illness: [name] is a 57 y.o. female with history of seronegative rheumatoid arthritis and osteoarthritis.  Patient remains on  Arava 10 mg 1 tablet by mouth daily and Plaquenil 200 mg 1 tablet by mouth twice daily.  She is tolerating combination therapy without any side effects and has not missed any doses recently.  She denies any recent rheumatoid arthritis flares.  Patient states that she has been under the care of Dr. Penni Bombard at [facility] for management of left knee joint and left ankle joint pain.  She has been going to physical therapy for the past 6 weeks which has been alleviating her symptoms.  She had an MRI of the left knee and the left ankle in June and brought the records for [place] to review.  Patient had a left knee joint cortisone injection which provided little to no relief.  According to the patient it is felt that some of her discomfort is radiating from her lower back.  She will be starting physical therapy for her lower back and there has been discussion of possibly proceeding with an epidural injection in the future. She has established care with pain management.   She denies any recent or recurrent infections.      Activities of Daily Living:  Patient reports morning stiffness for several hours.   Patient Reports nocturnal pain.  Difficulty dressing/grooming: Denies Difficulty climbing stairs: Denies Difficulty getting out of chair: Denies Difficulty using hands for taps, buttons, cutlery, and/or writing: Reports  Review of Systems  Constitutional:  Negative for fatigue.  HENT:  Negative for mouth sores and mouth dryness.   Eyes:  Positive for itching and dryness. Negative for  pain.  Respiratory:  Positive for shortness of breath and wheezing.   Cardiovascular:  Negative for chest pain and palpitations.  Gastrointestinal:  Negative for blood in stool, constipation and diarrhea.  Endocrine: Negative for increased urination.  Genitourinary:  Negative for involuntary urination.  Musculoskeletal:  Positive for joint pain, gait problem, joint pain, joint swelling and morning stiffness. Negative for myalgias, muscle weakness, muscle tenderness and myalgias.  Skin:  Positive for color change and sensitivity to sunlight. Negative for rash and hair loss.  Allergic/Immunologic: Positive for susceptible to infections.  Neurological:  Positive for numbness. Negative for dizziness and headaches.  Hematological:  Positive for bruising/bleeding tendency. Negative for swollen glands.  Psychiatric/Behavioral:  Positive for sleep disturbance. Negative for depressed mood. The patient is not nervous/anxious.     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   Primary osteoarthritis of both feet 10/09/2018   Hx of migraines 09/27/2018   History of gastroesophageal reflux (GERD) 09/27/2018  Palindromic rheumatism, 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   Moderate persistent asthma 07/05/2018   Hypothyroid 06/28/2018    Past Medical History:  Diagnosis Date   Arthritis    ra zero negative   Asthma    mild/moderate persistent asthma   Complication of anesthesia    slow to awaken 25 yrs ago   Elevated liver function tests dr Ivor Costa pcp manages   for last year   Family  history of breast cancer    Fibroid    Fractured rib    Frequent headaches    GERD (gastroesophageal reflux disease)    Gluten intolerance    H/O seasonal allergies    History of chicken pox    History of COVID-19 07/2019   high fever sob, x 7 days all symptoms resolved   Injury of peroneal tendon of left foot, initial encounter    Left bundle branch block 02/2019   Dr.Paula Ross   Migraines    Pneumonia    PONV (postoperative nausea and vomiting)    ponv with several surgeries, likes scopolamine patch   Thyroid disease    Multiple benign nodules--thyroid removed    Family History  Problem Relation Age of Onset   Arthritis Mother    Cancer Mother 58       breast ca--dec age 68   Miscarriages / [place] Mother    Breast cancer Mother 70   Glaucoma Father    Diabetes Father    Heart attack Father    Heart disease Father        pacemaker    Hyperlipidemia Father    Hypertension Father    Stroke Father        x4   COPD Sister    Peripheral Artery Disease Sister    Asthma Brother    Birth defects Brother    Cancer Brother        skin   Hyperlipidemia Brother    Alcohol abuse Brother    Early death Brother        suicide   Mental illness Brother    Breast cancer Maternal Aunt 60   Kidney cancer Paternal Aunt 42   Breast cancer Paternal Aunt 56   Throat cancer Paternal Uncle    Thyroid cancer Paternal Uncle    Heart attack Maternal Grandmother    Heart disease Maternal Grandmother    Hyperlipidemia Maternal Grandmother    Stroke Maternal Grandmother    Alcohol abuse Paternal Grandmother    Cancer Paternal Grandmother        colon   Cancer Paternal Grandfather        breast   Heart attack Paternal Grandfather    Breast cancer Paternal Grandfather 28   Asthma Daughter    Ehlers-Danlos syndrome Daughter    Fibromyalgia Daughter    Asthma Son    Asthma Son    Breast cancer Cousin 45   Prostate cancer Cousin    Past Surgical History:  Procedure  Laterality Date   ANTERIOR AND POSTERIOR REPAIR N/A 06/14/2020   Procedure: ANTERIOR (CYSTOCELE) AND POSTERIOR REPAIR (RECTOCELE);  Surgeon: Patton Salles, MD;  Location: [facility];  Service: Gynecology;  Laterality: N/A;   BLADDER SUSPENSION N/A 06/14/2020   Procedure: TRANSVAGINAL TAPE (TVT) PROCEDURE/EXACT MIDURETHRAL SLING;  Surgeon: Patton Salles, MD;  Location: [facility];  Service: Gynecology;  Laterality: N/A;  EXACT MIDURETHRAL SLING   CARDIAC CATHETERIZATION  2009   results normal done  in [place]   CHOLECYSTECTOMY  1995   laparoscopic   CYSTOSCOPY N/A 06/14/2020   Procedure: CYSTOSCOPY;  Surgeon: Patton Salles, MD;  Location: [facility];  Service: Gynecology;  Laterality: N/A;   HAMMER TOE SURGERY Bilateral 2009   HERNIA REPAIR  2015   Has abdominal wall mesh - [place], [place].  Laparoscopic incisional hernia repari with 7 x 9 inch Ventralight ST with Echo mesh   LAPAROSCOPIC HYSTERECTOMY  2007   Supracervical hysterectomy with cystosctopy [place], [place] partial   THYROID SURGERY  2013   total thyroidectomy due to 11 nodules   Social History   Social History Narrative   Right Handed   Lives in a two story home   Immunization History  Administered Date(s) Administered   Influenza,inj,Quad PF,6+ Mos 06/28/2018, 06/18/2019, 06/12/2022   Influenza-Unspecified 08/05/2020, 05/25/2021, 06/12/2022   Moderna Sars-Covid-2 Vaccination 01/27/2021   PFIZER(Purple Top)SARS-COV-2 Vaccination 12/08/2019, 01/05/2020, 08/05/2020   Pneumococcal Polysaccharide-23 10/22/2018   Td 06/12/2022   Tdap 06/28/2018, 06/12/2022   Zoster Recombinant(Shingrix) 04/15/2019, 07/16/2019     Objective: Vital Signs: BP 128/87 (BP Location: Left Arm, Patient Position: Sitting, Cuff Size: Normal)   Pulse 85   Resp 15   Ht 5\' 7"  (1.702 m)   Wt 180 lb 9.6 oz (81.9 kg)   LMP  (LMP Unknown)   BMI 28.29 kg/m    Physical  Exam Vitals and nursing note reviewed.  Constitutional:      Appearance: She is well-developed.  HENT:     Head: Normocephalic and atraumatic.  Eyes:     Conjunctiva/sclera: Conjunctivae normal.  Cardiovascular:     Rate and Rhythm: Normal rate and regular rhythm.     Heart sounds: Normal heart sounds.  Pulmonary:     Effort: Pulmonary effort is normal.     Breath sounds: Normal breath sounds.  Abdominal:     General: Bowel sounds are normal.     Palpations: Abdomen is soft.  Musculoskeletal:     Cervical back: Normal range of motion.  Lymphadenopathy:     Cervical: No cervical adenopathy.  Skin:    General: Skin is warm and dry.     Capillary Refill: Capillary refill takes less than 2 seconds.  Neurological:     Mental Status: She is alert and oriented to person, place, and time.  Psychiatric:        Behavior: Behavior normal.      Musculoskeletal Exam: C-spine, thoracic spine, lumbar spine have good range of motion.  No midline spinal tenderness.  No SI joint tenderness.  Shoulder joints, elbow joints, wrist joints, MCPs, PIPs, DIPs have good range of motion with no synovitis.  Complete fist formation bilaterally.  Hip joints have good range of motion with no groin pain.  Painful range of motion of the left knee joint but no warmth or effusion noted.  Ankle joints have good range of motion with no tenderness or synovitis.  Tenderness at the base of the left fifth metatarsal. No evidence of Achilles tendinitis or plantar fasciitis.  No tenderness or synovitis over MTP joints.  CDAI Exam: CDAI Score: 10  Patient Global: 40 / 100; Provider Global: 40 / 100 Swollen: 0 ; Tender: 2  Joint Exam 04/02/2023      Right  Left  Wrist   Tender     Knee      Tender     Investigation: No additional findings.  Imaging: No results found.  Recent Labs:  Lab Results  Component Value Date   WBC 5.4 03/01/2023   HGB 14.7 03/01/2023   PLT 283 03/01/2023   NA 142 03/01/2023   K  4.5 03/01/2023   CL 104 03/01/2023   CO2 27 03/01/2023   GLUCOSE 78 03/01/2023   BUN 14 03/01/2023   CREATININE 0.85 03/01/2023   BILITOT 0.7 03/01/2023   ALKPHOS 52 10/20/2021   AST 28 03/01/2023   ALT 26 03/01/2023   PROT 7.0 03/01/2023   ALBUMIN 4.7 10/20/2021   CALCIUM 10.4 03/01/2023   GFRAA 94 02/08/2021   QFTBGOLDPLUS NEGATIVE 12/20/2021    Speciality Comments: PLQ Eye Exam: 09/21/2022 [facility] Follow up in 1 year.   Inadequate response to methotrexate, Enbrel, Humira, Orencia-hives everywhere Rinvoq-pneumonia Arava-11/2021  Procedures:  No procedures performed Allergies: Orencia [abatacept], Shellfish allergy, Strawberry (diagnostic), Gluten meal, Lortab [hydrocodone-acetaminophen], Morphine and codeine, and Augmentin [amoxicillin-pot clavulanate]       Assessment / Plan:     Visit Diagnoses: Rheumatoid arthritis of multiple sites with negative rheumatoid factor (HCC) - Diagnosed at [place] [facility].  Previous ultrasound examination performed showed synovitis in bilateral hands: She has no synovitis on examination today.  She has not had any signs or symptoms of a rheumatoid arthritis flare.  She is clinically doing well taking Arava 10 mg 1 tablet by mouth daily and Plaquenil 200 mg 1 tablet by mouth twice daily.  She is tolerating combination therapy without any side effects.  She has not had any recent or recurrent infections.  She has been under the care of Dr. Penni Bombard for management of left knee and left ankle joint pain.  She had MRIs of both the left knee and left ankle in June 2024.  She has been going to physical therapy which has been helpful but according to the patient it is thought that her discomfort may be coming from her lower back and radiating down her left leg.  She will be starting physical therapy for her lower back and there has been discussion of possibly proceeding with an epidural injection in the future if needed.  She  will be initiating a trial of diclofenac 75 mg 1 tablet by mouth daily as needed for pain relief.  Discussed the importance of close lab monitoring. Overall her rheumatoid arthritis remains stable on the current treatment regimen.  No medication changes will be made at this time.  She was advised to notify us if she develops signs or symptoms of a flare.  She will follow-up in the office in 5 months or sooner if needed.  High risk medication use - Arava 10 mg 1 tablet by mouth daily and Plaquenil 200 mg 1 tablet by mouth twice daily. CBC and CMP updated on 03/01/23. Her next lab work will be due in September and every 3 months.  PLQ Eye Exam: 09/21/2022 [facility] Follow up in 1 year.  No recent or recurrent infections. Discussed the importance of holding arava if she develops signs or symptoms of an infection and to resume once the infection has completely cleared.   Pain in both hands - X-rays of both hands were unremarkable on 09/27/2018.  No tenderness or synovitis.   Trochanteric bursitis of both hips: Not currently symptomatic.   Chronic pain of left knee: Under the care of Dr. Penni Bombard.  Minimal to no response to a left knee joint cortisone injection.  MRI of the left knee 03/08/2023: Severe lateral predominant patellofemoral chondral thinning  with mild subchondral cystic change.  1.5 cm ganglion cyst arising from proximal posterior joint capsule and extending proximally along the anterior margin of the popliteal neurovascular bundle. She has been going to physical therapy for the past 6 weeks which has been helpful.  At times her left knee joint pain is a 6 out of 10.  Discussed the possibility of proceeding with viscosupplementation in the future.  Primary osteoarthritis of both feet -Under care of Dr. Penni Bombard. X-rays of both feet from 09/27/2018 were reviewed today and are consistent with osteoarthritis and postsurgical changes.  No erosive changes.  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  Edema and irregularity of the calcaneofibular ligament suspicious for subacute high-grade sprain.  Low-grade interstitial split of the peroneus longus posterior to the lateral malleolus.  Frayed and mildly flattened  peroneus bevis at and distal to lateral malleolus with no discrete tear.  Improved slightly with PT.    Chronic midline low back pain with left-sided sciatica: She has established care with Dr. Penni Bombard and pain management.  She will be starting physical therapy for management of lower back pain.  There has also been discussion of possibly proceeding with an epidural injection in the future.   Other medical conditions are listed as follows:   Keratosis pilaris  Hx of migraines  Decreased cardiac ejection fraction  Dry scalp  Hair loss  Hot flashes  History of gastroesophageal reflux (GERD)  History of asthma  History of hypothyroidism  Orders: No orders of the defined types were placed in this encounter.  No orders of the defined types were placed in this encounter.    Follow-Up Instructions: Return in about 5 months (around 09/02/2023) for Rheumatoid arthritis, Osteoarthritis.   Gearldine Bienenstock, PA-C  Note - This record has been created using Dragon software.  Chart creation errors have been sought, but may not always  have been located. Such creation errors do not reflect on  the standard of medical care.

## 2023-04-02 ENCOUNTER — Ambulatory Visit: Payer: 59 | Attending: Physician Assistant | Admitting: Physician Assistant

## 2023-04-02 ENCOUNTER — Encounter: Payer: Self-pay | Admitting: Physician Assistant

## 2023-04-02 VITALS — BP 128/87 | HR 85 | Resp 15 | Ht 67.0 in | Wt 180.6 lb

## 2023-04-02 DIAGNOSIS — L659 Nonscarring hair loss, unspecified: Secondary | ICD-10-CM | POA: Diagnosis not present

## 2023-04-02 DIAGNOSIS — M25562 Pain in left knee: Secondary | ICD-10-CM

## 2023-04-02 DIAGNOSIS — R931 Abnormal findings on diagnostic imaging of heart and coronary circulation: Secondary | ICD-10-CM

## 2023-04-02 DIAGNOSIS — M19072 Primary osteoarthritis, left ankle and foot: Secondary | ICD-10-CM

## 2023-04-02 DIAGNOSIS — M79641 Pain in right hand: Secondary | ICD-10-CM

## 2023-04-02 DIAGNOSIS — L858 Other specified epidermal thickening: Secondary | ICD-10-CM

## 2023-04-02 DIAGNOSIS — Z8709 Personal history of other diseases of the respiratory system: Secondary | ICD-10-CM

## 2023-04-02 DIAGNOSIS — Z79899 Other long term (current) drug therapy: Secondary | ICD-10-CM | POA: Diagnosis not present

## 2023-04-02 DIAGNOSIS — M5442 Lumbago with sciatica, left side: Secondary | ICD-10-CM

## 2023-04-02 DIAGNOSIS — Z8639 Personal history of other endocrine, nutritional and metabolic disease: Secondary | ICD-10-CM

## 2023-04-02 DIAGNOSIS — M0609 Rheumatoid arthritis without rheumatoid factor, multiple sites: Secondary | ICD-10-CM

## 2023-04-02 DIAGNOSIS — Z8669 Personal history of other diseases of the nervous system and sense organs: Secondary | ICD-10-CM

## 2023-04-02 DIAGNOSIS — M7061 Trochanteric bursitis, right hip: Secondary | ICD-10-CM

## 2023-04-02 DIAGNOSIS — R238 Other skin changes: Secondary | ICD-10-CM

## 2023-04-02 DIAGNOSIS — G8929 Other chronic pain: Secondary | ICD-10-CM

## 2023-04-02 DIAGNOSIS — M7062 Trochanteric bursitis, left hip: Secondary | ICD-10-CM

## 2023-04-02 DIAGNOSIS — M19071 Primary osteoarthritis, right ankle and foot: Secondary | ICD-10-CM

## 2023-04-02 DIAGNOSIS — Z8719 Personal history of other diseases of the digestive system: Secondary | ICD-10-CM

## 2023-04-02 DIAGNOSIS — R232 Flushing: Secondary | ICD-10-CM

## 2023-04-02 DIAGNOSIS — M79642 Pain in left hand: Secondary | ICD-10-CM

## 2023-04-02 NOTE — Patient Instructions (Signed)
Standing Labs We placed an order today for your standing lab work.   Please have your standing labs drawn in September and every 3 months   Please have your labs drawn 2 weeks prior to your appointment so that the provider can discuss your lab results at your appointment, if possible.  Please note that you may see your imaging and lab results in MyChart before we have reviewed them. We will contact you once all results are reviewed. Please allow our office up to 72 hours to thoroughly review all of the results before contacting the office for clarification of your results.  WALK-IN LAB HOURS  Monday through Thursday from 8:00 am -12:30 pm and 1:00 pm-5:00 pm and Friday from 8:00 am-12:00 pm.  Patients with office visits requiring labs will be seen before walk-in labs.  You may encounter longer than normal wait times. Please allow additional time. Wait times may be shorter on  Monday and Thursday afternoons.  We do not book appointments for walk-in labs. We appreciate your patience and understanding with our staff.   Labs are drawn by [facility]. Please bring your co-pay at the time of your lab draw.  You may receive a bill from [facility] for your lab work.  Please note if you are on Hydroxychloroquine and and an order has been placed for a Hydroxychloroquine level,  you will need to have it drawn 4 hours or more after your last dose.  If you wish to have your labs drawn at another location, please call the office 24 hours in advance so we can fax the orders.  The office is located at [address], [address], [place], [place] [address]   If you have any questions regarding directions or hours of operation,  please call [phone].   As a reminder, please drink plenty of water prior to coming for your lab work. Thanks!  

## 2023-04-24 NOTE — Progress Notes (Unsigned)
Office Visit Note  Patient: [name]             Date of Birth: 1965-11-30           MRN: [id number]             PCP: Bary Leriche, PA-C Referring: Allwardt, Crist Infante, PA-C Visit Date: 04/25/2023 Occupation: @GUAROCC @  Subjective:  Rash   History of Present Illness: [name] is a 57 y.o. female with history of seronegative rheumatoid arthritis and osteoarthritis.  She is taking Arava 10 mg 1 tablet by mouth daily and Plaquenil 200 mg 1 tablet by mouth twice daily.   CBC and CMP updated on 03/01/23.  PLQ Eye Exam: 09/21/2022 WNL [facility] Follow up in 1 year.   Activities of Daily Living:  Patient reports morning stiffness for *** {minute/hour:19697}.   Patient {ACTIONS;DENIES/REPORTS:21021675::"Denies"} nocturnal pain.  Difficulty dressing/grooming: {ACTIONS;DENIES/REPORTS:21021675::"Denies"} Difficulty climbing stairs: {ACTIONS;DENIES/REPORTS:21021675::"Denies"} Difficulty getting out of chair: {ACTIONS;DENIES/REPORTS:21021675::"Denies"} Difficulty using hands for taps, buttons, cutlery, and/or writing: {ACTIONS;DENIES/REPORTS:21021675::"Denies"}  No Rheumatology ROS completed.   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   Primary osteoarthritis of both feet 10/09/2018   Hx of migraines 09/27/2018   History of gastroesophageal reflux (GERD) 09/27/2018   Palindromic rheumatism, 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   Moderate persistent asthma 07/05/2018   Hypothyroid 06/28/2018    Past Medical History:  Diagnosis Date   Arthritis    ra zero negative    Asthma    mild/moderate persistent asthma   Complication of anesthesia    slow to awaken 25 yrs ago   Elevated liver function tests dr Ivor Costa pcp manages   for last year   Family history of breast cancer    Fibroid    Fractured rib    Frequent headaches    GERD (gastroesophageal reflux disease)    Gluten intolerance    H/O seasonal allergies    History of chicken pox    History of COVID-19 07/2019   high fever sob, x 7 days all symptoms resolved   Injury of peroneal tendon of left foot, initial encounter    Left bundle branch block 02/2019   Dr.Paula Ross   Migraines    Pneumonia    PONV (postoperative nausea and vomiting)    ponv with several surgeries, likes scopolamine patch   Thyroid disease    Multiple benign nodules--thyroid removed    Family History  Problem Relation Age of Onset   Arthritis Mother    Cancer Mother 100       breast ca--dec age 64   Miscarriages / [place] Mother    Breast cancer Mother 58   Glaucoma Father    Diabetes Father    Heart attack Father    Heart disease Father        pacemaker    Hyperlipidemia Father    Hypertension Father    Stroke Father        x4   COPD Sister    Peripheral Artery Disease Sister    Asthma Brother    Birth defects Brother  Cancer Brother        skin   Hyperlipidemia Brother    Alcohol abuse Brother    Early death Brother        suicide   Mental illness Brother    Breast cancer Maternal Aunt 60   Kidney cancer Paternal Aunt 78   Breast cancer Paternal Aunt 70   Throat cancer Paternal Uncle    Thyroid cancer Paternal Uncle    Heart attack Maternal Grandmother    Heart disease Maternal Grandmother    Hyperlipidemia Maternal Grandmother    Stroke Maternal Grandmother    Alcohol abuse Paternal Grandmother    Cancer Paternal Grandmother        colon   Cancer Paternal Grandfather        breast   Heart attack Paternal Grandfather    Breast cancer Paternal Grandfather 70   Asthma Daughter     Ehlers-Danlos syndrome Daughter    Fibromyalgia Daughter    Asthma Son    Asthma Son    Breast cancer Cousin 45   Prostate cancer Cousin    Past Surgical History:  Procedure Laterality Date   ANTERIOR AND POSTERIOR REPAIR N/A 06/14/2020   Procedure: ANTERIOR (CYSTOCELE) AND POSTERIOR REPAIR (RECTOCELE);  Surgeon: Patton Salles, MD;  Location: [facility];  Service: Gynecology;  Laterality: N/A;   BLADDER SUSPENSION N/A 06/14/2020   Procedure: TRANSVAGINAL TAPE (TVT) PROCEDURE/EXACT MIDURETHRAL SLING;  Surgeon: Patton Salles, MD;  Location: [facility];  Service: Gynecology;  Laterality: N/A;  EXACT MIDURETHRAL SLING   CARDIAC CATHETERIZATION  2009   results normal done in [place]   CHOLECYSTECTOMY  1995   laparoscopic   CYSTOSCOPY N/A 06/14/2020   Procedure: CYSTOSCOPY;  Surgeon: Patton Salles, MD;  Location: [facility];  Service: Gynecology;  Laterality: N/A;   HAMMER TOE SURGERY Bilateral 2009   HERNIA REPAIR  2015   Has abdominal wall mesh - [place], [place].  Laparoscopic incisional hernia repari with 7 x 9 inch Ventralight ST with Echo mesh   LAPAROSCOPIC HYSTERECTOMY  2007   Supracervical hysterectomy with cystosctopy [place], [place] partial   THYROID SURGERY  2013   total thyroidectomy due to 11 nodules   Social History   Social History Narrative   Right Handed   Lives in a two story home   Immunization History  Administered Date(s) Administered   Influenza,inj,Quad PF,6+ Mos 06/28/2018, 06/18/2019, 06/12/2022   Influenza-Unspecified 08/05/2020, 05/25/2021, 06/12/2022   Moderna Sars-Covid-2 Vaccination 01/27/2021   PFIZER(Purple Top)SARS-COV-2 Vaccination 12/08/2019, 01/05/2020, 08/05/2020   Pneumococcal Polysaccharide-23 10/22/2018   Td 06/12/2022   Tdap 06/28/2018, 06/12/2022   Zoster Recombinant(Shingrix) 04/15/2019, 07/16/2019     Objective: Vital Signs: LMP  (LMP Unknown)     Physical Exam Vitals and nursing note reviewed.  Constitutional:      Appearance: She is well-developed.  HENT:     Head: Normocephalic and atraumatic.  Eyes:     Conjunctiva/sclera: Conjunctivae normal.  Cardiovascular:     Rate and Rhythm: Normal rate and regular rhythm.     Heart sounds: Normal heart sounds.  Pulmonary:     Effort: Pulmonary effort is normal.     Breath sounds: Normal breath sounds.  Abdominal:     General: Bowel sounds are normal.     Palpations: Abdomen is soft.  Musculoskeletal:     Cervical back: Normal range of motion.  Lymphadenopathy:     Cervical:  No cervical adenopathy.  Skin:    General: Skin is warm and dry.     Capillary Refill: Capillary refill takes less than 2 seconds.  Neurological:     Mental Status: She is alert and oriented to person, place, and time.  Psychiatric:        Behavior: Behavior normal.      Musculoskeletal Exam: ***  CDAI Exam: CDAI Score: -- Patient Global: --; Provider Global: -- Swollen: --; Tender: -- Joint Exam 04/25/2023   No joint exam has been documented for this visit   There is currently no information documented on the homunculus. Go to the Rheumatology activity and complete the homunculus joint exam.  Investigation: No additional findings.  Imaging: No results found.  Recent Labs: Lab Results  Component Value Date   WBC 5.4 03/01/2023   HGB 14.7 03/01/2023   PLT 283 03/01/2023   NA 142 03/01/2023   K 4.5 03/01/2023   CL 104 03/01/2023   CO2 27 03/01/2023   GLUCOSE 78 03/01/2023   BUN 14 03/01/2023   CREATININE 0.85 03/01/2023   BILITOT 0.7 03/01/2023   ALKPHOS 52 10/20/2021   AST 28 03/01/2023   ALT 26 03/01/2023   PROT 7.0 03/01/2023   ALBUMIN 4.7 10/20/2021   CALCIUM 10.4 03/01/2023   GFRAA 94 02/08/2021   QFTBGOLDPLUS NEGATIVE 12/20/2021    Speciality Comments: PLQ Eye Exam: 09/21/2022 [facility] Follow up in 1 year.   Inadequate response to methotrexate, Enbrel,  Humira, Orencia-hives everywhere Rinvoq-pneumonia Arava-11/2021  Procedures:  No procedures performed Allergies: Orencia [abatacept], Shellfish allergy, Strawberry (diagnostic), Gluten meal, Lortab [hydrocodone-acetaminophen], Morphine and codeine, and Augmentin [amoxicillin-pot clavulanate]   Assessment / Plan:     Visit Diagnoses: Rheumatoid arthritis of multiple sites with negative rheumatoid factor (HCC)  High risk medication use  Hair loss  Pain in both hands  Trochanteric bursitis of both hips  Primary osteoarthritis of both feet  Keratosis pilaris  Decreased cardiac ejection fraction  Hx of migraines  Dry scalp  Hot flashes  History of gastroesophageal reflux (GERD)  History of asthma  History of hypothyroidism  Orders: No orders of the defined types were placed in this encounter.  No orders of the defined types were placed in this encounter.   Face-to-face time spent with patient was *** minutes. Greater than 50% of time was spent in counseling and coordination of care.  Follow-Up Instructions: No follow-ups on file.   Gearldine Bienenstock, PA-C  Note - This record has been created using Dragon software.  Chart creation errors have been sought, but may not always  have been located. Such creation errors do not reflect on  the standard of medical care.

## 2023-04-25 ENCOUNTER — Encounter: Payer: Self-pay | Admitting: Physician Assistant

## 2023-04-25 ENCOUNTER — Ambulatory Visit: Payer: 59 | Attending: Physician Assistant | Admitting: Physician Assistant

## 2023-04-25 VITALS — BP 125/83 | HR 87 | Resp 15 | Ht 67.0 in | Wt 177.0 lb

## 2023-04-25 DIAGNOSIS — L659 Nonscarring hair loss, unspecified: Secondary | ICD-10-CM

## 2023-04-25 DIAGNOSIS — Z8669 Personal history of other diseases of the nervous system and sense organs: Secondary | ICD-10-CM

## 2023-04-25 DIAGNOSIS — M7061 Trochanteric bursitis, right hip: Secondary | ICD-10-CM

## 2023-04-25 DIAGNOSIS — Z79899 Other long term (current) drug therapy: Secondary | ICD-10-CM | POA: Diagnosis not present

## 2023-04-25 DIAGNOSIS — M19072 Primary osteoarthritis, left ankle and foot: Secondary | ICD-10-CM

## 2023-04-25 DIAGNOSIS — M7062 Trochanteric bursitis, left hip: Secondary | ICD-10-CM

## 2023-04-25 DIAGNOSIS — R7689 Other specified abnormal immunological findings in serum: Secondary | ICD-10-CM

## 2023-04-25 DIAGNOSIS — R768 Other specified abnormal immunological findings in serum: Secondary | ICD-10-CM | POA: Diagnosis not present

## 2023-04-25 DIAGNOSIS — Z8719 Personal history of other diseases of the digestive system: Secondary | ICD-10-CM

## 2023-04-25 DIAGNOSIS — Z8709 Personal history of other diseases of the respiratory system: Secondary | ICD-10-CM

## 2023-04-25 DIAGNOSIS — M79642 Pain in left hand: Secondary | ICD-10-CM

## 2023-04-25 DIAGNOSIS — M0609 Rheumatoid arthritis without rheumatoid factor, multiple sites: Secondary | ICD-10-CM

## 2023-04-25 DIAGNOSIS — G8929 Other chronic pain: Secondary | ICD-10-CM

## 2023-04-25 DIAGNOSIS — M79641 Pain in right hand: Secondary | ICD-10-CM

## 2023-04-25 DIAGNOSIS — R232 Flushing: Secondary | ICD-10-CM

## 2023-04-25 DIAGNOSIS — M5442 Lumbago with sciatica, left side: Secondary | ICD-10-CM

## 2023-04-25 DIAGNOSIS — L858 Other specified epidermal thickening: Secondary | ICD-10-CM

## 2023-04-25 DIAGNOSIS — Z8639 Personal history of other endocrine, nutritional and metabolic disease: Secondary | ICD-10-CM

## 2023-04-25 DIAGNOSIS — R931 Abnormal findings on diagnostic imaging of heart and coronary circulation: Secondary | ICD-10-CM

## 2023-04-25 DIAGNOSIS — R238 Other skin changes: Secondary | ICD-10-CM

## 2023-04-25 DIAGNOSIS — M19071 Primary osteoarthritis, right ankle and foot: Secondary | ICD-10-CM

## 2023-04-25 MED ORDER — LEFLUNOMIDE 20 MG PO TABS: 20.0000 mg | ORAL_TABLET | Freq: Every day | ORAL | 0 refills | Status: DC

## 2023-04-25 NOTE — Patient Instructions (Signed)
Standing Labs We placed an order today for your standing lab work.   Please have your standing labs drawn in 2-3 weeks  Please have your labs drawn 2 weeks prior to your appointment so that the provider can discuss your lab results at your appointment, if possible.  Please note that you may see your imaging and lab results in MyChart before we have reviewed them. We will contact you once all results are reviewed. Please allow our office up to 72 hours to thoroughly review all of the results before contacting the office for clarification of your results.  WALK-IN LAB HOURS  Monday through Thursday from 8:00 am -12:30 pm and 1:00 pm-5:00 pm and Friday from 8:00 am-12:00 pm.  Patients with office visits requiring labs will be seen before walk-in labs.  You may encounter longer than normal wait times. Please allow additional time. Wait times may be shorter on  Monday and Thursday afternoons.  We do not book appointments for walk-in labs. We appreciate your patience and understanding with our staff.   Labs are drawn by [facility]. Please bring your co-pay at the time of your lab draw.  You may receive a bill from [facility] for your lab work.  Please note if you are on Hydroxychloroquine and and an order has been placed for a Hydroxychloroquine level,  you will need to have it drawn 4 hours or more after your last dose.  If you wish to have your labs drawn at another location, please call the office 24 hours in advance so we can fax the orders.  The office is located at [address], [address], [place], [place] [address]   If you have any questions regarding directions or hours of operation,  please call [phone].   As a reminder, please drink plenty of water prior to coming for your lab work. Thanks!

## 2023-05-07 ENCOUNTER — Other Ambulatory Visit: Payer: Self-pay | Admitting: Physician Assistant

## 2023-05-08 NOTE — Telephone Encounter (Signed)
Hair loss is a possible side effect with Arava.  Ok to reduce arava if she would like to but we were trying the increased dose due to recurrent rashes and ongoing joint pain.

## 2023-05-08 NOTE — Telephone Encounter (Signed)
It would be difficult to predict if the hair loss will slow down/improve with time.   Patient can try alternating 10 mg and 20 mg every other day to see if the hair loss will slow down.

## 2023-05-10 ENCOUNTER — Ambulatory Visit (INDEPENDENT_AMBULATORY_CARE_PROVIDER_SITE_OTHER): Payer: 59 | Admitting: Podiatry

## 2023-05-10 ENCOUNTER — Encounter: Payer: Self-pay | Admitting: Podiatry

## 2023-05-10 DIAGNOSIS — B351 Tinea unguium: Secondary | ICD-10-CM | POA: Diagnosis not present

## 2023-05-10 NOTE — Progress Notes (Signed)
  Subjective:  Patient ID: [name], female    DOB: March 03, 1966,   MRN: [id number]  No chief complaint on file.   57 y.o. female presents for concern of a hole in her right great toenail and her left great toenail. Relates she has had polish on for a long time and recently went to the salon and found some discoloration and a hole in her nail and wanted to check it out. Denies any treatments.  . Denies any other pedal complaints. Denies n/v/f/c.   Past Medical History:  Diagnosis Date   Arthritis    ra zero negative   Asthma    mild/moderate persistent asthma   Complication of anesthesia    slow to awaken 25 yrs ago   Elevated liver function tests dr Ivor Costa pcp manages   for last year   Family history of breast cancer    Fibroid    Fractured rib    Frequent headaches    GERD (gastroesophageal reflux disease)    Gluten intolerance    H/O seasonal allergies    History of chicken pox    History of COVID-19 07/2019   high fever sob, x 7 days all symptoms resolved   Injury of peroneal tendon of left foot, initial encounter    Left bundle branch block 02/2019   Dr.Paula Ross   Migraines    Pneumonia    PONV (postoperative nausea and vomiting)    ponv with several surgeries, likes scopolamine patch   Thyroid disease    Multiple benign nodules--thyroid removed    Objective:  Physical Exam: Vascular: DP/PT pulses 2/4 bilateral. CFT <3 seconds. Normal hair growth on digits. No edema.  Skin. No lacerations or abrasions bilateral feet. Bilateral hallux nails dystrophic and discolored with subungual debris.  Musculoskeletal: MMT 5/5 bilateral lower extremities in DF, PF, Inversion and Eversion. Deceased ROM in DF of ankle joint.  Neurological: Sensation intact to light touch.   Assessment:   1. Onychomycosis      Plan:  Patient was evaluated and treated and all questions answered. -Examined patient -Discussed treatment options for painful dystrophic nails  -Clinical  picture and Fungal culture was obtained by removing a portion of the hard nail itself from each of the involved toenails using a sterile nail nipper and sent to [facility] lab. Patient tolerated the biopsy procedure well without discomfort or need for anesthesia.  -Discussed fungal nail treatment options including oral, topical, and laser treatments.  -Patient to return in 4 weeks for follow up evaluation and discussion of fungal culture results or sooner if symptoms worsen.   Louann Sjogren, DPM

## 2023-05-16 ENCOUNTER — Other Ambulatory Visit: Payer: Self-pay | Admitting: *Deleted

## 2023-05-16 DIAGNOSIS — Z79899 Other long term (current) drug therapy: Secondary | ICD-10-CM

## 2023-05-16 NOTE — Progress Notes (Signed)
CBC WNL

## 2023-05-17 LAB — COMPLETE METABOLIC PANEL WITH GFR
AG Ratio: 1.9 (calc) (ref 1.0–2.5)
ALT: 29 U/L (ref 6–29)
AST: 31 U/L (ref 10–35)
Albumin: 4.2 g/dL (ref 3.6–5.1)
Alkaline phosphatase (APISO): 52 U/L (ref 37–153)
BUN: 15 mg/dL (ref 7–25)
CO2: 27 mmol/L (ref 20–32)
Calcium: 9.6 mg/dL (ref 8.6–10.4)
Chloride: 105 mmol/L (ref 98–110)
Creat: 0.83 mg/dL (ref 0.50–1.03)
Globulin: 2.2 g/dL (ref 1.9–3.7)
Glucose, Bld: 84 mg/dL (ref 65–99)
Potassium: 4.4 mmol/L (ref 3.5–5.3)
Sodium: 139 mmol/L (ref 135–146)
Total Bilirubin: 0.8 mg/dL (ref 0.2–1.2)
Total Protein: 6.4 g/dL (ref 6.1–8.1)
eGFR: 83 mL/min/{1.73_m2} (ref >=60)

## 2023-05-17 LAB — CBC WITH DIFFERENTIAL/PLATELET
Absolute Monocytes: 459 {cells}/uL (ref 200–950)
Basophils Absolute: 61 {cells}/uL (ref 0–200)
Basophils Relative: 1.2 %
Eosinophils Absolute: 138 {cells}/uL (ref 15–500)
Eosinophils Relative: 2.7 %
HCT: 40.2 % (ref 35.0–45.0)
Hemoglobin: 13.4 g/dL (ref 11.7–15.5)
Lymphs Abs: 1627 {cells}/uL (ref 850–3900)
MCH: 28.2 pg (ref 27.0–33.0)
MCHC: 33.3 g/dL (ref 32.0–36.0)
MCV: 84.5 fL (ref 80.0–100.0)
MPV: 11.9 fL (ref 7.5–12.5)
Monocytes Relative: 9 %
Neutro Abs: 2815 {cells}/uL (ref 1500–7800)
Neutrophils Relative %: 55.2 %
Platelets: 293 10*3/uL (ref 140–400)
RBC: 4.76 10*6/uL (ref 3.80–5.10)
RDW: 12.8 % (ref 11.0–15.0)
Total Lymphocyte: 31.9 %
WBC: 5.1 10*3/uL (ref 3.8–10.8)

## 2023-05-17 NOTE — Progress Notes (Signed)
CMP WNL

## 2023-05-18 ENCOUNTER — Ambulatory Visit: Payer: 59 | Admitting: Podiatry

## 2023-05-20 ENCOUNTER — Other Ambulatory Visit: Payer: Self-pay | Admitting: Physician Assistant

## 2023-05-20 DIAGNOSIS — M1230 Palindromic rheumatism, unspecified site: Secondary | ICD-10-CM

## 2023-05-22 NOTE — Telephone Encounter (Signed)
Last Fill: 02/19/2023  Eye exam: 09/21/2022 WNL    Labs: 05/16/2023 CMP WNL CBC WNL   Next Visit: 06/26/2023  Last Visit: 04/25/2023  DX: Rheumatoid arthritis of multiple sites with negative rheumatoid factor   Current Dose per office note 04/25/2023: Plaquenil 200 mg 1 tablet by mouth twice daily.   Okay to refill Plaquenil?

## 2023-06-07 ENCOUNTER — Encounter: Payer: Self-pay | Admitting: Podiatry

## 2023-06-07 ENCOUNTER — Ambulatory Visit (INDEPENDENT_AMBULATORY_CARE_PROVIDER_SITE_OTHER): Payer: 59 | Admitting: Podiatry

## 2023-06-07 DIAGNOSIS — B351 Tinea unguium: Secondary | ICD-10-CM | POA: Diagnosis not present

## 2023-06-07 NOTE — Progress Notes (Signed)
Subjective:  Patient ID: [name], female    DOB: 10-14-1965,   MRN: [id number]  Chief Complaint  Patient presents with   Nail Problem     Pt presents for a follow up and discussion of fungal culture results.      57 y.o. female presents for  follow-up on nail changes and to discuss results.   . Denies any other pedal complaints. Denies n/v/f/c.   Past Medical History:  Diagnosis Date   Arthritis    ra zero negative   Asthma    mild/moderate persistent asthma   Complication of anesthesia    slow to awaken 25 yrs ago   Elevated liver function tests dr Ivor Costa pcp manages   for last year   Family history of breast cancer    Fibroid    Fractured rib    Frequent headaches    GERD (gastroesophageal reflux disease)    Gluten intolerance    H/O seasonal allergies    History of chicken pox    History of COVID-19 07/2019   high fever sob, x 7 days all symptoms resolved   Injury of peroneal tendon of left foot, initial encounter    Left bundle branch block 02/2019   Dr.Paula Ross   Migraines    Pneumonia    PONV (postoperative nausea and vomiting)    ponv with several surgeries, likes scopolamine patch   Thyroid disease    Multiple benign nodules--thyroid removed    Objective:  Physical Exam: Vascular: DP/PT pulses 2/4 bilateral. CFT <3 seconds. Normal hair growth on digits. No edema.  Skin. No lacerations or abrasions bilateral feet. Bilateral hallux thickend but nicely covered in polish today.  Musculoskeletal: MMT 5/5 bilateral lower extremities in DF, PF, Inversion and Eversion. Deceased ROM in DF of ankle joint.  Neurological: Sensation intact to light touch.   Assessment:   1. Onychomycosis      Plan:  Patient was evaluated and treated and all questions answered. -Examined patient -Discussed treatment options for painful dystrophic nails  -Culture not finalized but so far no growth of fungus. Likely just damage to the nail at this point.  -Discussed  treatment options including urea and avoiding keeping nail polish on for too long of a time.   -Patient to return as needed.    Louann Sjogren, DPM

## 2023-06-11 NOTE — Telephone Encounter (Signed)
Leflunomide can cause hair loss.  If patient wants to switch therapy then we will have to schedule an appointment.

## 2023-06-15 ENCOUNTER — Other Ambulatory Visit: Payer: Self-pay | Admitting: Internal Medicine

## 2023-06-26 ENCOUNTER — Encounter: Payer: Self-pay | Admitting: Physician Assistant

## 2023-06-26 ENCOUNTER — Other Ambulatory Visit: Payer: Self-pay | Admitting: Pharmacist

## 2023-06-26 ENCOUNTER — Ambulatory Visit: Payer: 59 | Attending: Physician Assistant | Admitting: Physician Assistant

## 2023-06-26 VITALS — BP 120/87 | HR 86 | Resp 14 | Ht 67.0 in | Wt 170.6 lb

## 2023-06-26 DIAGNOSIS — R768 Other specified abnormal immunological findings in serum: Secondary | ICD-10-CM | POA: Diagnosis not present

## 2023-06-26 DIAGNOSIS — L659 Nonscarring hair loss, unspecified: Secondary | ICD-10-CM | POA: Diagnosis not present

## 2023-06-26 DIAGNOSIS — M0609 Rheumatoid arthritis without rheumatoid factor, multiple sites: Secondary | ICD-10-CM

## 2023-06-26 DIAGNOSIS — M79642 Pain in left hand: Secondary | ICD-10-CM

## 2023-06-26 DIAGNOSIS — Z111 Encounter for screening for respiratory tuberculosis: Secondary | ICD-10-CM

## 2023-06-26 DIAGNOSIS — L858 Other specified epidermal thickening: Secondary | ICD-10-CM

## 2023-06-26 DIAGNOSIS — M7062 Trochanteric bursitis, left hip: Secondary | ICD-10-CM

## 2023-06-26 DIAGNOSIS — Z8639 Personal history of other endocrine, nutritional and metabolic disease: Secondary | ICD-10-CM

## 2023-06-26 DIAGNOSIS — M19071 Primary osteoarthritis, right ankle and foot: Secondary | ICD-10-CM

## 2023-06-26 DIAGNOSIS — Z8719 Personal history of other diseases of the digestive system: Secondary | ICD-10-CM

## 2023-06-26 DIAGNOSIS — M79641 Pain in right hand: Secondary | ICD-10-CM

## 2023-06-26 DIAGNOSIS — Z8709 Personal history of other diseases of the respiratory system: Secondary | ICD-10-CM

## 2023-06-26 DIAGNOSIS — Z79899 Other long term (current) drug therapy: Secondary | ICD-10-CM

## 2023-06-26 DIAGNOSIS — M7061 Trochanteric bursitis, right hip: Secondary | ICD-10-CM

## 2023-06-26 DIAGNOSIS — R931 Abnormal findings on diagnostic imaging of heart and coronary circulation: Secondary | ICD-10-CM

## 2023-06-26 DIAGNOSIS — R238 Other skin changes: Secondary | ICD-10-CM

## 2023-06-26 DIAGNOSIS — R232 Flushing: Secondary | ICD-10-CM

## 2023-06-26 DIAGNOSIS — M19072 Primary osteoarthritis, left ankle and foot: Secondary | ICD-10-CM

## 2023-06-26 DIAGNOSIS — R7689 Other specified abnormal immunological findings in serum: Secondary | ICD-10-CM

## 2023-06-26 DIAGNOSIS — Z8669 Personal history of other diseases of the nervous system and sense organs: Secondary | ICD-10-CM

## 2023-06-26 NOTE — Progress Notes (Signed)
Pending OV note from today, please start Xeljanz BIV. She will take low-dose:  Dose: 5mg  once daily Dx: RA  Has tried MTX (elevated LFTs), leflunomide (mood changes and hair loss)), Enbrel (inadequate response), Humira (N/V), Orencia (hives), Kevzara (inadequate response)  Chesley Mires, PharmD, MPH, BCPS, CPP Clinical Pharmacist (Rheumatology and Pulmonology)

## 2023-06-26 NOTE — Progress Notes (Signed)
Submitted a Prior Authorization request to [employer] for [employer] via CoverMyMeds. Will update once we receive a response.  Key: [id number]

## 2023-06-26 NOTE — Patient Instructions (Signed)
Standing Labs We placed an order today for your standing lab work.   Please have your standing labs drawn in 1 month then every 3 months  Please have your labs drawn 2 weeks prior to your appointment so that the provider can discuss your lab results at your appointment, if possible.  Please note that you may see your imaging and lab results in MyChart before we have reviewed them. We will contact you once all results are reviewed. Please allow our office up to 72 hours to thoroughly review all of the results before contacting the office for clarification of your results.  WALK-IN LAB HOURS  Monday through Thursday from 8:00 am -12:30 pm and 1:00 pm-5:00 pm and Friday from 8:00 am-12:00 pm.  Patients with office visits requiring labs will be seen before walk-in labs.  You may encounter longer than normal wait times. Please allow additional time. Wait times may be shorter on  Monday and Thursday afternoons.  We do not book appointments for walk-in labs. We appreciate your patience and understanding with our staff.   Labs are drawn by [facility]. Please bring your co-pay at the time of your lab draw.  You may receive a bill from [facility] for your lab work.  Please note if you are on Hydroxychloroquine and and an order has been placed for a Hydroxychloroquine level,  you will need to have it drawn 4 hours or more after your last dose.  If you wish to have your labs drawn at another location, please call the office 24 hours in advance so we can fax the orders.  The office is located at [address], [address], [place], [place] [address]   If you have any questions regarding directions or hours of operation,  please call [phone].   As a reminder, please drink plenty of water prior to coming for your lab work. Thanks!  Tofacitinib Extended-Release Tablets What is this medication? TOFACITINIB (TOE fa SYE it nib) treats autoimmune conditions, such as arthritis and ulcerative colitis. It is  prescribed when other medications have not worked or cannot be tolerated. It works by slowing down an overactive immune system. This decreases inflammation. This medicine may be used for other purposes; ask your health care provider or pharmacist if you have questions. COMMON BRAND NAME(S): Harriette Ohara XR What should I tell my care team before I take this medication? They need to know if you have any of these conditions: Blood clots Cancer Diabetes Heart disease High blood pressure High cholesterol HIV or AIDS Immune system problems Infection, such as tuberculosis (TB), or other bacterial, fungal, or viral infections Kidney disease Liver disease Low blood counts (white cells, red cells, and platelets) Lung or breathing disease, such as asthma or COPD Organ transplant Stomach or intestine problems Tobacco use An unusual or allergic reaction to tofacitinib, other medications, foods, dyes, or preservatives Pregnant or trying to get pregnant Breast-feeding How should I use this medication? Take this medication by mouth with water. Take it as directed on the prescription label at the same time every day. Do not cut, crush, or chew this medication. Swallow the tablets whole. You can take it with or without food. If it upsets your stomach, take it with food. Keep taking it unless your care team tells you to stop. A special MedGuide will be given to you by the pharmacist with each prescription and refill. Be sure to read this information carefully each time. Talk to your care team about the use of this  medication in children. Special care may be needed. Overdosage: If you think you have taken too much of this medicine contact a poison control center or emergency room at once. NOTE: This medicine is only for you. Do not share this medicine with others. What if I miss a dose? If you miss a dose, take it as soon as you can. If it is almost time for your next dose, take only that dose. Do not take  double or extra doses. What may interact with this medication? Do not take this medication with any of the following: Baricitinib Upadacitinib This medication may also interact with the following: Azathioprine Biologic medications, such as abatacept, adalimumab, anakinra, certolizumab, etanercept, golimumab, infliximab, ofatumumab, rituximab, sarilumab, secukinumab, tocilizumab, ustekinumab, vedolizumab Certain antivirals for HIV or hepatitis Certain medications for fungal infections, such as fluconazole, itraconazole, ketoconazole, voriconazole Certain medications for seizures, such as carbamazepine, phenobarbital, phenytoin Cyclosporine Live virus vaccines Medications that lower your chance of fighting infection Rifampin Supplements, such as St. John's wort Tacrolimus This list may not describe all possible interactions. Give your health care provider a list of all the medicines, herbs, non-prescription drugs, or dietary supplements you use. Also tell them if you smoke, drink alcohol, or use illegal drugs. Some items may interact with your medicine. What should I watch for while using this medication? Visit your care team for regular checks on your progress. Tell your care team if your symptoms do not start to get better or if they get worse. You may need blood work while taking this medication. The tablet shell of this medication does not dissolve. This is normal. The tablet shell may appear whole in the stool. This is not a cause for concern. This medication may increase your risk of getting an infection. Call your care team for advice if you get a fever, chills, sore throat, or other symptoms of a cold or flu. Do not treat yourself. Try to avoid being around people who are sick. Avoid taking medications that contain aspirin, acetaminophen, ibuprofen, naproxen, or ketoprofen unless instructed by your care team. These medications may hide a fever. Talk to your care team if you wish to  become pregnant or think you might be pregnant. This medication can cause serious birth defects. Do not breastfeed while taking this medication and for 36 hours after the last dose. Talk to your care team about your risk of cancer. You may be more at risk for certain types of cancer if you take this medication. Talk to your care team about your risk of skin cancer. You may be more at risk for skin cancer if you take this medication. This medication can make you more sensitive to the sun. Keep out of the sun. If you cannot avoid being in the sun, wear protective clothing and sunscreen. Do not use sun lamps, tanning beds, or tanning booths. What side effects may I notice from receiving this medication? Side effects that you should report to your care team as soon as possible: Allergic reactions--skin rash, itching, hives, swelling of the face, lips, tongue, or throat Blood clot in your leg--pain, swelling, or warmth in the leg Blood clot in your lungs--shortness of breath or chest pain Bowel blockage--stomach cramping, unable to have a bowel movement or pass gas, loss of appetite, vomiting Change in your skin, such as a new growth, a sore that doesn't heal, or a change in a mole Heart attack--pain or tightness in the chest, shoulders, arms, or jaw, nausea, shortness of breath,  cold or clammy skin, feeling faint or lightheaded Infection--fever, chills, cough, sore throat, wounds that don't heal, pain or trouble when passing urine, general feeling of discomfort or being unwell Low red blood cell level--unusual weakness or fatigue, dizziness, headache, trouble breathing Stomach pain that is severe, does not go away, or gets worse Stroke--sudden numbness or weakness of the face, arm, or leg, trouble speaking, confusion, trouble walking, loss of balance or coordination, dizziness, severe headache, change in vision Side effects that usually do not require medical attention (report to your care team if they  continue or are bothersome): Diarrhea Fever Headache Increase in blood pressure Nausea Runny or stuffy nose Sore throat This list may not describe all possible side effects. Call your doctor for medical advice about side effects. You may report side effects to FDA at 1-800-FDA-1088. Where should I keep my medication? Keep out of the reach of children and pets. Store at room temperature between 20 and 25 degrees C (68 and 77 degrees F). Get rid of any unused medication after the expiration date. To get rid of medications that are no longer needed or have expired: Take the medication to a medication take-back program. Check with your pharmacy or law enforcement to find a location. If you cannot return the medication, check the label or package insert to see if the medication should be thrown out in the garbage or flushed down the toilet. If you are not sure, ask your care team. If it is safe to put it in the trash, empty the medication out of the container. Mix the medication with cat litter, dirt, coffee grounds, or other unwanted substance. Seal the mixture in a bag or container. Put it in the trash. NOTE: This sheet is a summary. It may not cover all possible information. If you have questions about this medicine, talk to your doctor, pharmacist, or health care provider.  2024 Elsevier/Gold Standard (2022-01-06 00:00:00)

## 2023-06-26 NOTE — Progress Notes (Signed)
Pharmacy Note  Subjective: Patient presents today to [facility] for follow up office visit.  Patient seen by the pharmacist for counseling on Xeljanz for rheumatoid arthritis.    Prior therapy includes:Leflunomide   History of diverticulitis?  No  History of MI, stroke, or CV events:  No  Objective:  CMP     Component Value Date/Time   NA 139 05/16/2023 0917   NA 137 07/25/2016 0000   K 4.4 05/16/2023 0917   CL 105 05/16/2023 0917   CO2 27 05/16/2023 0917   GLUCOSE 84 05/16/2023 0917   BUN 15 05/16/2023 0917   CREATININE 0.83 05/16/2023 0917   CALCIUM 9.6 05/16/2023 0917   PROT 6.4 05/16/2023 0917   PROT 6.5 07/28/2019 0847   ALBUMIN 4.7 10/20/2021 2127   ALBUMIN 4.5 07/28/2019 0847   AST 31 05/16/2023 0917   ALT 29 05/16/2023 0917   ALKPHOS 52 10/20/2021 2127   BILITOT 0.8 05/16/2023 0917   BILITOT 0.6 07/28/2019 0847   GFRNONAA >60 10/20/2021 2127   GFRNONAA 81 02/08/2021 1044   GFRAA 94 02/08/2021 1044    CBC    Component Value Date/Time   WBC 5.1 05/16/2023 0917   RBC 4.76 05/16/2023 0917   HGB 13.4 05/16/2023 0917   HGB 12.8 07/27/2020 1038   HGB 12.2 07/05/2020 0936   HCT 40.2 05/16/2023 0917   HCT 35.3 07/05/2020 0936   PLT 293 05/16/2023 0917   PLT 298 07/27/2020 1038   PLT 275 07/05/2020 0936   MCV 84.5 05/16/2023 0917   MCV 88 07/05/2020 0936   MCH 28.2 05/16/2023 0917   MCHC 33.3 05/16/2023 0917   RDW 12.8 05/16/2023 0917   RDW 12.7 07/05/2020 0936   LYMPHSABS 1,627 05/16/2023 0917   LYMPHSABS 1.8 07/05/2020 0936   MONOABS 0.6 10/20/2021 2127   EOSABS 138 05/16/2023 0917   EOSABS 0.2 07/05/2020 0936   BASOSABS 61 05/16/2023 0917   BASOSABS 0.1 07/05/2020 0936    Baseline Immunosuppressant Therapy Labs TB GOLD    Latest Ref Rng & Units 12/20/2021    9:26 AM  Quantiferon TB Gold  Quantiferon TB Gold Plus NEGATIVE NEGATIVE    Hepatitis Panel    Latest Ref Rng & Units 10/23/2018   12:16 PM  Hepatitis  Hep B Surface Ag  NON-REACTI NON-REACTIVE   Hep B IgM NON-REACTI NON-REACTIVE   Hep C Ab NON-REACTI NON-REACTIVE   Hep A IgM NON-REACTI NON-REACTIVE    HIV Lab Results  Component Value Date   HIV NON-REACTIVE 10/23/2018   Immunoglobulins    Latest Ref Rng & Units 10/23/2018   12:16 PM  Immunoglobulin Electrophoresis  IgA  47 - 310 mg/dL 161   IgG 096 - 0,454 mg/dL 098   IgM 50 - 119 mg/dL 147    SPEP    Latest Ref Rng & Units 05/16/2023    9:17 AM  Serum Protein Electrophoresis  Total Protein 6.1 - 8.1 g/dL 6.4    W2NF Lab Results  Component Value Date   G6PDH 16.2 09/27/2018   TPMT No results found for: "TPMT"   Lipid panel Lab Results  Component Value Date   CHOL 151 12/20/2021   HDL 66 12/20/2021   LDLCALC 67 12/20/2021   TRIG 96 12/20/2021   CHOLHDL 2.3 12/20/2021     Chest x-ray (February 2023):  IMPRESSION: No active cardiopulmonary disease.  Assessment/Plan: Patient received counseling for Xeljanz. We discussed Harriette Ohara is a JAK inhibitor.  Counseled patient on purpose, proper  use, and adverse effects of Xeljanz. Reviewed the most common adverse effects including infection, diarrhea, headaches. Also reviewed rare adverse effects such as bowel injury and the need to contact us if they develop stomach pain during treatment.  Counseled on the increased risk of DVT/PE.  Counseled about FDA black box warning of MACE (major adverse CV events including cardiovascular death, myocardial infarction, and stroke).  Reviewed with patient that there is the possibility of an increased risk of malignancy specifically lung cancer and lymphomas but it is not well understood if this increased risk is due to the medication or the disease state. Instructed patient that medication should be held for infection and prior to surgery.  Advised patient to avoid live vaccines. Recommend annual influenza, PCV 15 or PCV20 or Pneumovax 23, and Shingrix as indicated.  Counseled that Harriette Ohara should be held for  infection and prior to surgery.  Reviewed importance of routine lab monitoring including a lipid panel.  Will recheck lipid panel 3 months after starting and annually thereafter. CBC and CMP will be monitored routinely every 3 months. Standing orders placed and patient is to return in 1 month and then every 3 months after initiation.   Provided patient with medication education material and answered all questions.  Patient consented to [place]. Will upload Harriette Ohara into patient's chart.  Will apply through patient's insurance and update when we receive a response.  Sofie Rower, PharmD [employer] PGY1

## 2023-06-27 ENCOUNTER — Other Ambulatory Visit (HOSPITAL_COMMUNITY): Payer: Self-pay

## 2023-06-27 LAB — CBC WITH DIFFERENTIAL/PLATELET
Absolute Monocytes: 452 {cells}/uL (ref 200–950)
Basophils Absolute: 62 {cells}/uL (ref 0–200)
Basophils Relative: 1.2 %
Eosinophils Absolute: 120 {cells}/uL (ref 15–500)
Eosinophils Relative: 2.3 %
HCT: 40.7 % (ref 35.0–45.0)
Hemoglobin: 13.4 g/dL (ref 11.7–15.5)
Lymphs Abs: 1544 {cells}/uL (ref 850–3900)
MCH: 28.2 pg (ref 27.0–33.0)
MCHC: 32.9 g/dL (ref 32.0–36.0)
MCV: 85.7 fL (ref 80.0–100.0)
MPV: 12.2 fL (ref 7.5–12.5)
Monocytes Relative: 8.7 %
Neutro Abs: 3021 {cells}/uL (ref 1500–7800)
Neutrophils Relative %: 58.1 %
Platelets: 248 10*3/uL (ref 140–400)
RBC: 4.75 10*6/uL (ref 3.80–5.10)
RDW: 13 % (ref 11.0–15.0)
Total Lymphocyte: 29.7 %
WBC: 5.2 10*3/uL (ref 3.8–10.8)

## 2023-06-27 LAB — COMPLETE METABOLIC PANEL WITH GFR
AG Ratio: 2.1 (calc) (ref 1.0–2.5)
ALT: 20 U/L (ref 6–29)
AST: 26 U/L (ref 10–35)
Albumin: 4.6 g/dL (ref 3.6–5.1)
Alkaline phosphatase (APISO): 47 U/L (ref 37–153)
BUN: 13 mg/dL (ref 7–25)
CO2: 27 mmol/L (ref 20–32)
Calcium: 10.3 mg/dL (ref 8.6–10.4)
Chloride: 105 mmol/L (ref 98–110)
Creat: 0.82 mg/dL (ref 0.50–1.03)
Globulin: 2.2 g/dL (ref 1.9–3.7)
Glucose, Bld: 80 mg/dL (ref 65–99)
Potassium: 4.3 mmol/L (ref 3.5–5.3)
Sodium: 140 mmol/L (ref 135–146)
Total Bilirubin: 1 mg/dL (ref 0.2–1.2)
Total Protein: 6.8 g/dL (ref 6.1–8.1)
eGFR: 84 mL/min/{1.73_m2} (ref >=60)

## 2023-06-27 LAB — LIPID PANEL
Cholesterol: 134 mg/dL (ref <200)
HDL: 58 mg/dL (ref >=50)
LDL Cholesterol (Calc): 61 mg/dL
Non-HDL Cholesterol (Calc): 76 mg/dL (ref <130)
Total CHOL/HDL Ratio: 2.3 (calc) (ref <5.0)
Triglycerides: 69 mg/dL (ref <150)

## 2023-06-27 NOTE — Progress Notes (Signed)
CBC and CMP WNL  Lipid panel WNL

## 2023-06-27 NOTE — Progress Notes (Signed)
Received notification from [employer] regarding a prior authorization for [facility]. Authorization has been APPROVED from 06/26/2023 to 12/23/2023.   Unable to run test claim because patient must fill through [facility]: [phone]  Authorization # [id number]  Patient will need to sign up for the copay card. Rx can be sent to [facility] after TB gold results and as long as it's negative  Chesley Mires, PharmD, MPH, BCPS, CPP Clinical Pharmacist (Rheumatology and Pulmonology)

## 2023-06-29 LAB — QUANTIFERON-TB GOLD PLUS
Mitogen-NIL: >10 [IU]/mL
NIL: 0.03 [IU]/mL
QuantiFERON-TB Gold Plus: NEGATIVE
TB1-NIL: 0 [IU]/mL
TB2-NIL: <0 [IU]/mL

## 2023-06-29 NOTE — Progress Notes (Signed)
TB gold negative

## 2023-07-02 MED ORDER — XELJANZ 5 MG PO TABS
5.0000 mg | ORAL_TABLET | Freq: Every day | ORAL | 0 refills | Status: DC
Start: 2023-07-02 — End: 2023-07-24

## 2023-07-02 NOTE — Progress Notes (Signed)
TB gold negative on 06/26/2023.  Rx for [facility] sent to [facility]. Spoke with patient. She states she talked to pharmacy the other day and was advised to opt into Save On benefit to help with copay. She will plan to call insurance today. She has already signed up for the [place] copay card  Chesley Mires, PharmD, MPH, BCPS, CPP Clinical Pharmacist (Rheumatology and Pulmonology)

## 2023-07-02 NOTE — Addendum Note (Signed)
Addended by: Murrell Redden on: 07/02/2023 10:30 AM   Modules accepted: Orders

## 2023-07-21 ENCOUNTER — Other Ambulatory Visit: Payer: Self-pay | Admitting: Physician Assistant

## 2023-07-24 ENCOUNTER — Other Ambulatory Visit: Payer: Self-pay | Admitting: Physician Assistant

## 2023-07-24 DIAGNOSIS — Z79899 Other long term (current) drug therapy: Secondary | ICD-10-CM

## 2023-07-24 DIAGNOSIS — M0609 Rheumatoid arthritis without rheumatoid factor, multiple sites: Secondary | ICD-10-CM

## 2023-07-24 NOTE — Telephone Encounter (Signed)
Last Fill: 07/02/2023 (30 day supply)  Labs: 06/26/2023 CBC and CMP WNL Lipid panel WNL  TB Gold: 06/26/2023 Neg   Next Visit: 09/06/2023  Last Visit: 06/26/2023  DX: Rheumatoid arthritis of multiple sites with negative rheumatoid factor   Current Dose per office note 06/26/2023: Harriette Ohara 5 mg 1 tablet daily   Okay to refill Harriette Ohara?

## 2023-08-09 ENCOUNTER — Other Ambulatory Visit: Payer: Self-pay | Admitting: Physician Assistant

## 2023-08-21 ENCOUNTER — Ambulatory Visit: Payer: 59 | Admitting: Physician Assistant

## 2023-08-24 NOTE — Progress Notes (Deleted)
Office Visit Note  Patient: [name]             Date of Birth: [birth year]           MRN: [id number]             PCP: Bary Leriche, PA-C Referring: Allwardt, Crist Infante, PA-C Visit Date: 09/06/2023 Occupation: @GUAROCC @  Subjective:  No chief complaint on file.   History of Present Illness: [name] is a 57 y.o. female ***     Activities of Daily Living:  Patient reports morning stiffness for *** {minute/hour:19697}.   Patient {ACTIONS;DENIES/REPORTS:21021675::"Denies"} nocturnal pain.  Difficulty dressing/grooming: {ACTIONS;DENIES/REPORTS:21021675::"Denies"} Difficulty climbing stairs: {ACTIONS;DENIES/REPORTS:21021675::"Denies"} Difficulty getting out of chair: {ACTIONS;DENIES/REPORTS:21021675::"Denies"} Difficulty using hands for taps, buttons, cutlery, and/or writing: {ACTIONS;DENIES/REPORTS:21021675::"Denies"}  No Rheumatology ROS completed.   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   Primary osteoarthritis of both feet 10/09/2018   Hx of migraines 09/27/2018   History of gastroesophageal reflux (GERD) 09/27/2018   Palindromic rheumatism, 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   Moderate persistent asthma 07/05/2018   Hypothyroid 06/28/2018    Past Medical History:  Diagnosis Date   Arthritis    ra zero negative   Asthma    mild/moderate persistent asthma   Complication of anesthesia    slow to awaken 25 yrs ago   Elevated liver function tests dr Ivor Costa pcp manages   for last year   Family history of breast cancer    Fibroid    Fractured  rib    Frequent headaches    GERD (gastroesophageal reflux disease)    Gluten intolerance    H/O seasonal allergies    History of chicken pox    History of COVID-19 07/2019   high fever sob, x 7 days all symptoms resolved   Injury of peroneal tendon of left foot, initial encounter    Left bundle branch block 02/2019   Dr.Paula Ross   Migraines    Pneumonia    PONV (postoperative nausea and vomiting)    ponv with several surgeries, likes scopolamine patch   Thyroid disease    Multiple benign nodules--thyroid removed    Family History  Problem Relation Age of Onset   Arthritis Mother    Cancer Mother 42       breast ca--dec age 65   Miscarriages / [place] Mother    Breast cancer Mother 86   Glaucoma Father    Diabetes Father    Heart attack Father    Heart disease Father        pacemaker    Hyperlipidemia Father    Hypertension Father    Stroke Father        x4   COPD Sister    Peripheral Artery Disease Sister    Asthma Brother    Birth defects Brother    Cancer Brother        skin   Hyperlipidemia Brother    Alcohol abuse Brother    Early death Brother        suicide   Mental illness Brother    Breast  cancer Maternal Aunt 60   Kidney cancer Paternal Aunt 92   Breast cancer Paternal Aunt 59   Throat cancer Paternal Uncle    Thyroid cancer Paternal Uncle    Heart attack Maternal Grandmother    Heart disease Maternal Grandmother    Hyperlipidemia Maternal Grandmother    Stroke Maternal Grandmother    Alcohol abuse Paternal Grandmother    Cancer Paternal Grandmother        colon   Cancer Paternal Grandfather        breast   Heart attack Paternal Grandfather    Breast cancer Paternal Grandfather 31   Asthma Daughter    Ehlers-Danlos syndrome Daughter    Fibromyalgia Daughter    Asthma Son    Asthma Son    Breast cancer Cousin 45   Prostate cancer Cousin    Past Surgical History:  Procedure Laterality Date   ANTERIOR AND POSTERIOR REPAIR N/A  06/14/2020   Procedure: ANTERIOR (CYSTOCELE) AND POSTERIOR REPAIR (RECTOCELE);  Surgeon: Patton Salles, MD;  [facility];  Service: Gynecology;  Laterality: N/A;   BLADDER SUSPENSION N/A 06/14/2020   Procedure: TRANSVAGINAL TAPE (TVT) PROCEDURE/EXACT MIDURETHRAL SLING;  Surgeon: Patton Salles, MD;  Location[facility];  Service: Gynecology;  Laterality: N/A;  EXACT MIDURETHRAL SLING   CARDIAC CATHETERIZATION  2009   results normal done in [place]   CHOLECYSTECTOMY  1995   laparoscopic   CYSTOSCOPY N/A 06/14/2020   Procedure: CYSTOSCOPY;  Surgeon: Patton Salles, MD;  Location: [facility];  Service: Gynecology;  Laterality: N/A;   HAMMER TOE SURGERY Bilateral 2009   HERNIA REPAIR  2015   Has abdominal wall mesh - [place], [place].  Laparoscopic incisional hernia repari with 7 x 9 inch Ventralight ST with Echo mesh   LAPAROSCOPIC HYSTERECTOMY  2007   Supracervical hysterectomy with cystosctopy [place], [place] partial   THYROID SURGERY  2013   total thyroidectomy due to 11 nodules   Social History   Social History Narrative   Right Handed   Lives in a two story home   Immunization History  Administered Date(s) Administered   Influenza,inj,Quad PF,6+ Mos 06/28/2018, 06/18/2019, 06/12/2022   Influenza-Unspecified 08/05/2020, 05/25/2021, 06/12/2022, 06/28/2023   Moderna Sars-Covid-2 Vaccination 01/27/2021   PFIZER(Purple Top)SARS-COV-2 Vaccination 12/08/2019, 01/05/2020, 08/05/2020   Pneumococcal Polysaccharide-23 10/22/2018   Pneumococcal-Unspecified 06/28/2023   Td 06/12/2022   Tdap 06/28/2018, 06/12/2022   Zoster Recombinant(Shingrix) 04/15/2019, 07/16/2019     Objective: Vital Signs: LMP  (LMP Unknown)    Physical Exam   Musculoskeletal Exam: ***  CDAI Exam: CDAI Score: -- Patient Global: --; Provider Global: -- Swollen: --; Tender: -- Joint Exam 09/06/2023   No joint exam has been  documented for this visit   There is currently no information documented on the homunculus. Go to the Rheumatology activity and complete the homunculus joint exam.  Investigation: No additional findings.  Imaging: No results found.  Recent Labs: Lab Results  Component Value Date   WBC 5.2 06/26/2023   HGB 13.4 06/26/2023   PLT 248 06/26/2023   NA 140 06/26/2023   K 4.3 06/26/2023   CL 105 06/26/2023   CO2 27 06/26/2023   GLUCOSE 80 06/26/2023   BUN 13 06/26/2023   CREATININE 0.82 06/26/2023   BILITOT 1.0 06/26/2023   ALKPHOS 52 10/20/2021   AST 26 06/26/2023   ALT 20 06/26/2023   PROT 6.8 06/26/2023   ALBUMIN  4.7 10/20/2021   CALCIUM 10.3 06/26/2023   GFRAA 94 02/08/2021   QFTBGOLDPLUS NEGATIVE 06/26/2023    Speciality Comments: PLQ Eye Exam: 09/21/2022 WNL [facility] Follow up in 1 year.   Inadequate response to methotrexate, Enbrel, Humira, Orencia-hives everywhere Rinvoq-pneumonia [place]-11/2021  Procedures:  No procedures performed Allergies: Orencia [abatacept], Shellfish allergy, Strawberry (diagnostic), Gluten meal, Lortab [hydrocodone-acetaminophen], Morphine and codeine, and Augmentin [amoxicillin-pot clavulanate]   Assessment / Plan:     Visit Diagnoses: No diagnosis found.  Orders: No orders of the defined types were placed in this encounter.  No orders of the defined types were placed in this encounter.   Face-to-face time spent with patient was *** minutes. Greater than 50% of time was spent in counseling and coordination of care.  Follow-Up Instructions: No follow-ups on file.   Ellen Henri, CMA  Note - This record has been created using Animal nutritionist.  Chart creation errors have been sought, but may not always  have been located. Such creation errors do not reflect on  the standard of medical care.

## 2023-08-30 NOTE — Progress Notes (Signed)
Office Visit Note  Patient: [name]             Date of Birth: 10/08/1965           MRN: [id number]             PCP: Bary Leriche, PA-C Referring: Allwardt, Crist Infante, PA-C Visit Date: 09/10/2023 Occupation: @GUAROCC @  Subjective:  Joint pain and stiffness  History of Present Illness: [name] is a 57 y.o. female with seronegative rheumatoid arthritis and osteoarthritis.  She states she just came back from [place] yesterday.  She states that she has been experiencing increased discomfort in her joints with temperature change and also taking care of her baggage..  She describes discomfort in her bilateral hands, knees and her ankles.  She has noticed occasional swelling in her ankles.  She continues to take Xeljanz 5 mg p.o. daily and Plaquenil 200 mg p.o. twice daily without any interruption.  She states the Raynauds has been very bothersome especially in her feet.  She denies any shortness of breath.      Activities of Daily Living:  Patient reports morning stiffness for 1 hour.   Patient Reports nocturnal pain.  Difficulty dressing/grooming: Reports Difficulty climbing stairs: Denies Difficulty getting out of chair: Denies Difficulty using hands for taps, buttons, cutlery, and/or writing: Denies  Review of Systems  Constitutional:  Negative for fatigue.  HENT:  Positive for mouth dryness. Negative for mouth sores.   Eyes:  Positive for dryness.  Respiratory:  Negative for shortness of breath.   Cardiovascular:  Positive for palpitations. Negative for chest pain.  Gastrointestinal:  Negative for blood in stool, constipation and diarrhea.  Endocrine: Negative for increased urination.  Genitourinary:  Negative for involuntary urination.  Musculoskeletal:  Positive for joint pain, joint pain, joint swelling and morning stiffness. Negative for gait problem, myalgias, muscle weakness, muscle tenderness and myalgias.  Skin:  Positive for hair loss. Negative for color  change, rash and sensitivity to sunlight.  Allergic/Immunologic: Positive for susceptible to infections.  Neurological:  Negative for dizziness and headaches.  Hematological:  Negative for swollen glands.  Psychiatric/Behavioral:  Negative for depressed mood and sleep disturbance. The patient is not nervous/anxious.     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   Primary osteoarthritis of both feet 10/09/2018   Hx of migraines 09/27/2018   History of gastroesophageal reflux (GERD) 09/27/2018   Palindromic rheumatism, 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   Moderate persistent asthma 07/05/2018   Hypothyroid 06/28/2018    Past Medical History:  Diagnosis Date   Arthritis    ra zero negative   Asthma    mild/moderate persistent asthma   Complication of anesthesia    slow to awaken 25 yrs ago   Elevated liver function tests dr Ivor Costa pcp manages   for last year   Family history of breast cancer    Fibroid    Fractured rib    Frequent headaches    GERD (gastroesophageal reflux disease)    Gluten intolerance    H/O seasonal allergies  History of chicken pox    History of COVID-19 07/2019   high fever sob, x 7 days all symptoms resolved   Injury of peroneal tendon of left foot, initial encounter    Left bundle branch block 02/2019   Dr.Paula Ross   Migraines    Pneumonia    PONV (postoperative nausea and vomiting)    ponv with several surgeries, likes scopolamine patch   Thyroid disease    Multiple benign nodules--thyroid removed    Family History  Problem  Relation Age of Onset   Arthritis Mother    Cancer Mother 26       breast ca--dec age 36   Miscarriages / [place] Mother    Breast cancer Mother 64   Glaucoma Father    Diabetes Father    Heart attack Father    Heart disease Father        pacemaker    Hyperlipidemia Father    Hypertension Father    Stroke Father        x4   COPD Sister    Peripheral Artery Disease Sister    Asthma Brother    Birth defects Brother    Cancer Brother        skin   Hyperlipidemia Brother    Alcohol abuse Brother    Early death Brother        suicide   Mental illness Brother    Breast cancer Maternal Aunt 60   Kidney cancer Paternal Aunt 62   Breast cancer Paternal Aunt 41   Throat cancer Paternal Uncle    Thyroid cancer Paternal Uncle    Heart attack Maternal Grandmother    Heart disease Maternal Grandmother    Hyperlipidemia Maternal Grandmother    Stroke Maternal Grandmother    Alcohol abuse Paternal Grandmother    Cancer Paternal Grandmother        colon   Cancer Paternal Grandfather        breast   Heart attack Paternal Grandfather    Breast cancer Paternal Grandfather 37   Asthma Daughter    Ehlers-Danlos syndrome Daughter    Fibromyalgia Daughter    Asthma Son    Asthma Son    Breast cancer Cousin 45   Prostate cancer Cousin    Past Surgical History:  Procedure Laterality Date   ANTERIOR AND POSTERIOR REPAIR N/A 06/14/2020   Procedure: ANTERIOR (CYSTOCELE) AND POSTERIOR REPAIR (RECTOCELE);  Surgeon: Patton Salles, MD;  Location: [facility];  Service: Gynecology;  Laterality: N/A;   BLADDER SUSPENSION N/A 06/14/2020   Procedure: TRANSVAGINAL TAPE (TVT) PROCEDURE/EXACT MIDURETHRAL SLING;  Surgeon: Patton Salles, MD;  Location: [facility];  Service: Gynecology;  Laterality: N/A;  EXACT MIDURETHRAL SLING   CARDIAC CATHETERIZATION  2009   results normal done in [place]   CHOLECYSTECTOMY  1995   laparoscopic   CYSTOSCOPY  N/A 06/14/2020   Procedure: CYSTOSCOPY;  Surgeon: Patton Salles, MD;  Location: [facility];  Service: Gynecology;  Laterality: N/A;   HAMMER TOE SURGERY Bilateral 2009   HERNIA REPAIR  2015   Has abdominal wall mesh - [place], [place].  Laparoscopic incisional hernia repari with 7 x 9 inch Ventralight ST with Echo mesh   LAPAROSCOPIC HYSTERECTOMY  2007   Supracervical hysterectomy with cystosctopy [place], [place] partial   THYROID SURGERY  2013   total thyroidectomy due to 11 nodules   Social History   Social  History Narrative   Right Handed   Lives in a two story home   Immunization History  Administered Date(s) Administered   Influenza,inj,Quad PF,6+ Mos 06/28/2018, 06/18/2019, 06/12/2022   Influenza-Unspecified 08/05/2020, 05/25/2021, 06/12/2022, 06/28/2023   Moderna Sars-Covid-2 Vaccination 01/27/2021   PFIZER(Purple Top)SARS-COV-2 Vaccination 12/08/2019, 01/05/2020, 08/05/2020   Pneumococcal Polysaccharide-23 10/22/2018   Pneumococcal-Unspecified 06/28/2023   Td 06/12/2022   Tdap 06/28/2018, 06/12/2022   Zoster Recombinant(Shingrix) 04/15/2019, 07/16/2019     Objective: Vital Signs: BP 119/82 (BP Location: Left Arm, Patient Position: Sitting, Cuff Size: Normal)   Pulse 84   Ht 5\' 7"  (1.702 m)   Wt 161 lb (73 kg)   LMP  (LMP Unknown)   BMI 25.22 kg/m    Physical Exam Vitals and nursing note reviewed.  Constitutional:      Appearance: She is well-developed.  HENT:     Head: Normocephalic and atraumatic.  Eyes:     Conjunctiva/sclera: Conjunctivae normal.  Cardiovascular:     Rate and Rhythm: Normal rate and regular rhythm.     Heart sounds: Normal heart sounds.  Pulmonary:     Effort: Pulmonary effort is normal.     Breath sounds: Normal breath sounds.  Abdominal:     General: Bowel sounds are normal.     Palpations: Abdomen is soft.  Musculoskeletal:     Cervical back: Normal range of motion.  Lymphadenopathy:     Cervical: No  cervical adenopathy.  Skin:    General: Skin is warm and dry.     Capillary Refill: Capillary refill takes less than 2 seconds.  Neurological:     Mental Status: She is alert and oriented to person, place, and time.  Psychiatric:        Behavior: Behavior normal.      Musculoskeletal Exam: Cervical, thoracic and lumbar spine 1 good range of motion.  Shoulder joints, elbow joints, wrist joints, MCPs PIPs and DIPs Juengel range of motion with no synovitis.  Hip joints and knee joints with good range of motion without any warmth swelling or effusion.  There was no tenderness over ankles or MTPs.  CDAI Exam: CDAI Score: -- Patient Global: --; Provider Global: -- Swollen: --; Tender: -- Joint Exam 09/10/2023   No joint exam has been documented for this visit   There is currently no information documented on the homunculus. Go to the Rheumatology activity and complete the homunculus joint exam.  Investigation: No additional findings.  Imaging: No results found.  Recent Labs: Lab Results  Component Value Date   WBC 5.2 06/26/2023   HGB 13.4 06/26/2023   PLT 248 06/26/2023   NA 140 06/26/2023   K 4.3 06/26/2023   CL 105 06/26/2023   CO2 27 06/26/2023   GLUCOSE 80 06/26/2023   BUN 13 06/26/2023   CREATININE 0.82 06/26/2023   BILITOT 1.0 06/26/2023   ALKPHOS 52 10/20/2021   AST 26 06/26/2023   ALT 20 06/26/2023   PROT 6.8 06/26/2023   ALBUMIN 4.7 10/20/2021   CALCIUM 10.3 06/26/2023   GFRAA 94 02/08/2021   QFTBGOLDPLUS NEGATIVE 06/26/2023   June 26, 2023 LDL 61  Speciality Comments: PLQ Eye Exam: 09/21/2022 WNL [facility] Follow up in 1 year.   Inadequate response to methotrexate, Enbrel, Humira, Orencia-hives everywhere Rinvoq-pneumonia Arava-11/2021  Procedures:  No procedures performed Allergies: Orencia [abatacept], Shellfish allergy, Strawberry (diagnostic), Gluten meal, Lortab [hydrocodone-acetaminophen], Morphine and codeine, and Augmentin  [amoxicillin-pot clavulanate]   Assessment / Plan:     Visit Diagnoses:  Rheumatoid arthritis of multiple sites with negative rheumatoid factor (HCC) - Diagnosed at [place] [facility].  Previous ultrasound examination performed showed synovitis in bilateral hands: Patient reports having discomfort in her bilateral hands, knee joints and ankles.  She just returned from [place] and she believes the increased activity put increased stress on her joints.  She notices intermittent swelling in her ankles.  On examination today she had no synovitis.  She denies any interruption in her treatment.  She continues to be on Xeljanz and Plaquenil combination.  High risk medication use - Xeljanz 5 mg 1 tablet by mouth daily, Plaquenil 200 mg 1 tablet po BID. Previous therapy: Enbrel, Humira, Orencia, Rinvoq, methotrexate, Kevzara,Arava.  June 26, 2023 CBC and CMP were normal.  TB Gold was negative.  Lipid panel was within normal limits.  She was advised to get repeat labs in January and every 3 months.  Information on immunization was placed in the AVS.  She was advised to hold Harriette Ohara if she develops an infection resume after the infection resolves.  Plaquenil eye exam was normal in January 2024.  Increased risk of skin cancer Harriette Ohara was discussed.  FDA blackbox warning associated with Harriette Ohara was again discussed and information was placed in the AVS.  Positive ANA (antinuclear antibody) - 04/16/2023: ANA positive, RNP +4.8, dsDNA-, Smith-, SCL 70-, Ro-, La- antichromatin antibodies negative, anti-Jo1 negative, anticentromere B antibodies negative.  Hair loss - discontinued Arava.  Pain in both hands-she complains of discomfort in her hands.  No synovitis was noted on the examination today.  Trochanteric bursitis of both hips-she is remittent discomfort in the trochanteric region.  Primary osteoarthritis of both feet -she complains of intermittent swelling in her ankles.  No synovitis was  noted.  Under care of Dr. Penni Bombard. X-rays of both feet from 09/27/2018 were reviewed today and are consistent w/ OA and postsurgical changes. Tried PT.  Other medical problems are listed as follows:  Keratosis pilaris  Decreased cardiac ejection fraction  Hx of migraines  Dry scalp  Hot flashes  History of gastroesophageal reflux (GERD)  History of hypothyroidism  History of asthma  Orders: No orders of the defined types were placed in this encounter.  No orders of the defined types were placed in this encounter.   Follow-Up Instructions: Return in about 5 months (around 02/08/2024) for Rheumatoid arthritis, Osteoarthritis.   Pollyann Savoy, MD  Note - This record has been created using Animal nutritionist.  Chart creation errors have been sought, but may not always  have been located. Such creation errors do not reflect on  the standard of medical care.

## 2023-09-06 ENCOUNTER — Ambulatory Visit: Payer: 59 | Admitting: Rheumatology

## 2023-09-06 DIAGNOSIS — R768 Other specified abnormal immunological findings in serum: Secondary | ICD-10-CM

## 2023-09-06 DIAGNOSIS — M19071 Primary osteoarthritis, right ankle and foot: Secondary | ICD-10-CM

## 2023-09-06 DIAGNOSIS — Z8719 Personal history of other diseases of the digestive system: Secondary | ICD-10-CM

## 2023-09-06 DIAGNOSIS — Z8639 Personal history of other endocrine, nutritional and metabolic disease: Secondary | ICD-10-CM

## 2023-09-06 DIAGNOSIS — Z8669 Personal history of other diseases of the nervous system and sense organs: Secondary | ICD-10-CM

## 2023-09-06 DIAGNOSIS — R232 Flushing: Secondary | ICD-10-CM

## 2023-09-06 DIAGNOSIS — Z8709 Personal history of other diseases of the respiratory system: Secondary | ICD-10-CM

## 2023-09-06 DIAGNOSIS — M79642 Pain in left hand: Secondary | ICD-10-CM

## 2023-09-06 DIAGNOSIS — L659 Nonscarring hair loss, unspecified: Secondary | ICD-10-CM

## 2023-09-06 DIAGNOSIS — M7061 Trochanteric bursitis, right hip: Secondary | ICD-10-CM

## 2023-09-06 DIAGNOSIS — R238 Other skin changes: Secondary | ICD-10-CM

## 2023-09-06 DIAGNOSIS — L858 Other specified epidermal thickening: Secondary | ICD-10-CM

## 2023-09-06 DIAGNOSIS — Z79899 Other long term (current) drug therapy: Secondary | ICD-10-CM

## 2023-09-06 DIAGNOSIS — M0609 Rheumatoid arthritis without rheumatoid factor, multiple sites: Secondary | ICD-10-CM

## 2023-09-06 DIAGNOSIS — R931 Abnormal findings on diagnostic imaging of heart and coronary circulation: Secondary | ICD-10-CM

## 2023-09-10 ENCOUNTER — Ambulatory Visit: Payer: 59 | Attending: Rheumatology | Admitting: Rheumatology

## 2023-09-10 ENCOUNTER — Encounter: Payer: Self-pay | Admitting: Rheumatology

## 2023-09-10 VITALS — BP 119/82 | HR 84 | Ht 67.0 in | Wt 161.0 lb

## 2023-09-10 DIAGNOSIS — M7062 Trochanteric bursitis, left hip: Secondary | ICD-10-CM

## 2023-09-10 DIAGNOSIS — Z79899 Other long term (current) drug therapy: Secondary | ICD-10-CM | POA: Diagnosis not present

## 2023-09-10 DIAGNOSIS — M19071 Primary osteoarthritis, right ankle and foot: Secondary | ICD-10-CM

## 2023-09-10 DIAGNOSIS — R768 Other specified abnormal immunological findings in serum: Secondary | ICD-10-CM

## 2023-09-10 DIAGNOSIS — R931 Abnormal findings on diagnostic imaging of heart and coronary circulation: Secondary | ICD-10-CM

## 2023-09-10 DIAGNOSIS — L858 Other specified epidermal thickening: Secondary | ICD-10-CM

## 2023-09-10 DIAGNOSIS — L659 Nonscarring hair loss, unspecified: Secondary | ICD-10-CM | POA: Diagnosis not present

## 2023-09-10 DIAGNOSIS — Z8719 Personal history of other diseases of the digestive system: Secondary | ICD-10-CM

## 2023-09-10 DIAGNOSIS — M19072 Primary osteoarthritis, left ankle and foot: Secondary | ICD-10-CM

## 2023-09-10 DIAGNOSIS — M0609 Rheumatoid arthritis without rheumatoid factor, multiple sites: Secondary | ICD-10-CM | POA: Diagnosis not present

## 2023-09-10 DIAGNOSIS — Z8639 Personal history of other endocrine, nutritional and metabolic disease: Secondary | ICD-10-CM

## 2023-09-10 DIAGNOSIS — M79642 Pain in left hand: Secondary | ICD-10-CM

## 2023-09-10 DIAGNOSIS — R232 Flushing: Secondary | ICD-10-CM

## 2023-09-10 DIAGNOSIS — M79641 Pain in right hand: Secondary | ICD-10-CM

## 2023-09-10 DIAGNOSIS — Z8709 Personal history of other diseases of the respiratory system: Secondary | ICD-10-CM

## 2023-09-10 DIAGNOSIS — M7061 Trochanteric bursitis, right hip: Secondary | ICD-10-CM

## 2023-09-10 DIAGNOSIS — Z8669 Personal history of other diseases of the nervous system and sense organs: Secondary | ICD-10-CM

## 2023-09-10 DIAGNOSIS — R238 Other skin changes: Secondary | ICD-10-CM

## 2023-09-10 NOTE — Patient Instructions (Addendum)
Standing Labs We placed an order today for your standing lab work.   Please have your standing labs drawn in January and every 3 months  Please have your labs drawn 2 weeks prior to your appointment so that the provider can discuss your lab results at your appointment, if possible.  Please note that you may see your imaging and lab results in MyChart before we have reviewed them. We will contact you once all results are reviewed. Please allow our office up to 72 hours to thoroughly review all of the results before contacting the office for clarification of your results.  WALK-IN LAB HOURS  Monday through Thursday from 8:00 am -12:30 pm and 1:00 pm-5:00 pm and Friday from 8:00 am-12:00 pm.  Patients with office visits requiring labs will be seen before walk-in labs.  You may encounter longer than normal wait times. Please allow additional time. Wait times may be shorter on  Monday and Thursday afternoons.  We do not book appointments for walk-in labs. We appreciate your patience and understanding with our staff.   Labs are drawn by [facility]. Please bring your co-pay at the time of your lab draw.  You may receive a bill from [facility] for your lab work.  Please note if you are on Hydroxychloroquine and and an order has been placed for a Hydroxychloroquine level,  you will need to have it drawn 4 hours or more after your last dose.  If you wish to have your labs drawn at another location, please call the office 24 hours in advance so we can fax the orders.  The office is located at [address], [place], [place] [address]   If you have any questions regarding directions or hours of operation,  please call [phone].   As a reminder, please drink plenty of water prior to coming for your lab work. Thanks!   Vaccines You are taking a medication(s) that can suppress your immune system.  The following immunizations are recommended: Flu annually Covid-19 RSV  Td/Tdap (tetanus,  diphtheria, pertussis) every 10 years Pneumonia (Prevnar 15 then Pneumovax 23 at least 1 year apart.  Alternatively, can take Prevnar 20 without needing additional dose) Shingrix: 2 doses from 4 weeks to 6 months apart  Please check with your PCP to make sure you are up to date.   If you have signs or symptoms of an infection or start antibiotics: First, call your PCP for workup of your infection. Hold your medication through the infection, until you complete your antibiotics, and until symptoms resolve if you take the following: Injectable medication (Actemra, Benlysta, Cimzia, Cosentyx, Enbrel, Humira, Kevzara, Orencia, Remicade, Simponi, Stelara, Taltz, Tremfya) Methotrexate Leflunomide (Arava) Mycophenolate (Cellcept) [name], or Rinvoq   Because you are taking Harriette Ohara, Rinvoq, or Olumiant, it is very important to know that this class of medications has a FDA BLACK BOX WARNING for major adverse cardiovascular events (MACE), thrombosis, mortality (including sudden cardiovascular death), serious infections, and lymphomas. MACE is defined as cardiovascular death, myocardial infarction, and stroke. Thrombosis includes deep venous thrombosis (DVT), pulmonary embolism (PE), and arterial thrombosis. If you are a current or former smoker, you are at higher risk for MACE.   Please get an annual skin examination to rule out skin cancer while you are on Xeljanz.  Please use sunscreen and sun protection.

## 2023-09-23 ENCOUNTER — Other Ambulatory Visit: Payer: Self-pay | Admitting: Physician Assistant

## 2023-09-23 DIAGNOSIS — Z79899 Other long term (current) drug therapy: Secondary | ICD-10-CM

## 2023-09-23 DIAGNOSIS — M0609 Rheumatoid arthritis without rheumatoid factor, multiple sites: Secondary | ICD-10-CM

## 2023-10-18 ENCOUNTER — Other Ambulatory Visit: Payer: Self-pay | Admitting: *Deleted

## 2023-10-18 ENCOUNTER — Other Ambulatory Visit: Payer: Self-pay

## 2023-10-18 DIAGNOSIS — Z79899 Other long term (current) drug therapy: Secondary | ICD-10-CM

## 2023-10-18 DIAGNOSIS — M0609 Rheumatoid arthritis without rheumatoid factor, multiple sites: Secondary | ICD-10-CM

## 2023-10-18 MED ORDER — XELJANZ 5 MG PO TABS: 5.0000 mg | ORAL_TABLET | Freq: Every day | ORAL | 0 refills | Status: DC

## 2023-10-18 NOTE — Telephone Encounter (Signed)
Last Fill: 07/24/2023  Labs: 06/26/2023  CBC and CMP WNL Lipid panel WNL  TB Gold: 06/26/2023  TB gold negative   Next Visit: 02/07/2024  Last Visit: 09/10/2023  [id number] arthritis of multiple sites with negative rheumatoid factor   Current Dose per office note 09/10/2023: Harriette Ohara 5 mg 1 tablet by mouth daily,   Okay to refill Harriette Ohara?

## 2023-11-02 ENCOUNTER — Other Ambulatory Visit: Payer: Self-pay | Admitting: Physician Assistant

## 2023-11-02 DIAGNOSIS — M1230 Palindromic rheumatism, unspecified site: Secondary | ICD-10-CM

## 2023-11-02 NOTE — Telephone Encounter (Signed)
Last Fill: 05/22/2023  Eye exam: 10/02/2023 WNL    Labs: 06/26/2023 CBC and CMP WNL   Next Visit: 02/07/2024  Last Visit: 09/10/2023  [id number] arthritis of multiple sites with negative rheumatoid factor   Current Dose per office note 09/10/2023: Plaquenil 200 mg 1 tablet po BID   Okay to refill Plaquenil?

## 2023-11-23 ENCOUNTER — Other Ambulatory Visit: Payer: Self-pay | Admitting: Physician Assistant

## 2023-12-11 ENCOUNTER — Other Ambulatory Visit: Payer: Self-pay | Admitting: Rheumatology

## 2023-12-11 DIAGNOSIS — M0609 Rheumatoid arthritis without rheumatoid factor, multiple sites: Secondary | ICD-10-CM

## 2023-12-11 DIAGNOSIS — Z79899 Other long term (current) drug therapy: Secondary | ICD-10-CM

## 2023-12-13 ENCOUNTER — Telehealth: Payer: Self-pay | Admitting: Pharmacist

## 2023-12-13 NOTE — Telephone Encounter (Signed)
 Received notification from [employer] regarding a prior authorization for [facility]. Authorization has been APPROVED from 11/13/2023 to 12/12/2024.  Patient must continue to fill through [facility]: [phone]  Authorization # [id number]  Chesley Mires, PharmD, MPH, BCPS, CPP Clinical Pharmacist (Rheumatology and Pulmonology)

## 2023-12-13 NOTE — Telephone Encounter (Signed)
 Submitted a Prior Authorization RENEWAL request to [employer] for [facility] via CoverMyMeds. Will update once we receive a response.  Key: [id number]

## 2024-01-27 ENCOUNTER — Other Ambulatory Visit: Payer: Self-pay | Admitting: Physician Assistant

## 2024-01-27 DIAGNOSIS — M1230 Palindromic rheumatism, unspecified site: Secondary | ICD-10-CM

## 2024-01-27 NOTE — Progress Notes (Deleted)
 Office Visit Note  Patient: [name]             Date of Birth: May 31, 1966           MRN: [id number]             PCP: Alda Amas, PA-C Referring: Allwardt, Deleta Felix, PA-C Visit Date: 02/07/2024 Occupation: @GUAROCC @  Subjective:  No chief complaint on file.   History of Present Illness: [name] is a 58 y.o. female ***     Activities of Daily Living:  Patient reports morning stiffness for *** {minute/hour:19697}.   Patient {ACTIONS;DENIES/REPORTS:21021675::"Denies"} nocturnal pain.  Difficulty dressing/grooming: {ACTIONS;DENIES/REPORTS:21021675::"Denies"} Difficulty climbing stairs: {ACTIONS;DENIES/REPORTS:21021675::"Denies"} Difficulty getting out of chair: {ACTIONS;DENIES/REPORTS:21021675::"Denies"} Difficulty using hands for taps, buttons, cutlery, and/or writing: {ACTIONS;DENIES/REPORTS:21021675::"Denies"}  No Rheumatology ROS completed.   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   Primary osteoarthritis of both feet 10/09/2018   Hx of migraines 09/27/2018   History of gastroesophageal reflux (GERD) 09/27/2018   Palindromic rheumatism, 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   Moderate persistent asthma 07/05/2018   Hypothyroid 06/28/2018    Past Medical History:  Diagnosis Date   Arthritis    ra zero negative   Asthma    mild/moderate persistent asthma   Complication of anesthesia    slow to awaken 25 yrs ago   Elevated liver function tests dr Sharyon Deis pcp manages   for last year   Family history of breast cancer    Fibroid    Fractured  rib    Frequent headaches    GERD (gastroesophageal reflux disease)    Gluten intolerance    H/O seasonal allergies    History of chicken pox    History of COVID-19 07/2019   high fever sob, x 7 days all symptoms resolved   Injury of peroneal tendon of left foot, initial encounter    Left bundle branch block 02/2019   Dr.Paula Ross   Migraines    Pneumonia    PONV (postoperative nausea and vomiting)    ponv with several surgeries, likes scopolamine  patch   Thyroid  disease    Multiple benign nodules--thyroid  removed    Family History  Problem Relation Age of Onset   Arthritis Mother    Cancer Mother 21       breast ca--dec age 32   Miscarriages / [place] Mother    Breast cancer Mother 21   Glaucoma Father    Diabetes Father    Heart attack Father    Heart disease Father        pacemaker    Hyperlipidemia Father    Hypertension Father    Stroke Father        x4   COPD Sister    Peripheral Artery Disease Sister    Asthma Brother    Birth defects Brother    Cancer Brother        skin   Hyperlipidemia Brother    Alcohol abuse Brother    Early death Brother        suicide   Mental illness Brother    Breast  cancer Maternal Aunt 60   Kidney cancer Paternal Aunt 62   Breast cancer Paternal Aunt 10   Throat cancer Paternal Uncle    Thyroid  cancer Paternal Uncle    Heart attack Maternal Grandmother    Heart disease Maternal Grandmother    Hyperlipidemia Maternal Grandmother    Stroke Maternal Grandmother    Alcohol abuse Paternal Grandmother    Cancer Paternal Grandmother        colon   Cancer Paternal Grandfather        breast   Heart attack Paternal Grandfather    Breast cancer Paternal Grandfather 85   Asthma Daughter    Ehlers-Danlos syndrome Daughter    Fibromyalgia Daughter    Asthma Son    Asthma Son    Breast cancer Cousin 45   Prostate cancer Cousin    Past Surgical History:  Procedure Laterality Date   ANTERIOR AND POSTERIOR REPAIR N/A  06/14/2020   Procedure: ANTERIOR (CYSTOCELE) AND POSTERIOR REPAIR (RECTOCELE);  Surgeon: Greta Leatherwood, MD;  Location: [facility];  Service: Gynecology;  Laterality: N/A;   BLADDER SUSPENSION N/A 06/14/2020   Procedure: TRANSVAGINAL TAPE (TVT) PROCEDURE/EXACT MIDURETHRAL SLING;  Surgeon: Greta Leatherwood, MD;  Location: [facility];  Service: Gynecology;  Laterality: N/A;  EXACT MIDURETHRAL SLING   CARDIAC CATHETERIZATION  2009   results normal done in [place]    CHOLECYSTECTOMY  1995   laparoscopic   CYSTOSCOPY N/A 06/14/2020   Procedure: CYSTOSCOPY;  Surgeon: Greta Leatherwood, MD;  Location: [facility];  Service: Gynecology;  Laterality: N/A;   HAMMER TOE SURGERY Bilateral 2009   HERNIA REPAIR  2015   Has abdominal wall mesh - [place], [place] .  Laparoscopic incisional hernia repari with 7 x 9 inch Ventralight ST with Echo mesh   LAPAROSCOPIC HYSTERECTOMY  2007   Supracervical hysterectomy with cystosctopy [place] partial   THYROID  SURGERY  2013   total thyroidectomy due to 11 nodules   Social History   Social History Narrative   Right Handed   Lives in a two story home   Immunization History  Administered Date(s) Administered   Influenza,inj,Quad PF,6+ Mos 06/28/2018, 06/18/2019, 06/12/2022   Influenza-Unspecified 08/05/2020, 05/25/2021, 06/12/2022, 06/28/2023   Moderna Sars-Covid-2 Vaccination 01/27/2021   PFIZER(Purple Top)SARS-COV-2 Vaccination 12/08/2019, 01/05/2020, 08/05/2020   Pneumococcal Polysaccharide-23 10/22/2018   Pneumococcal-Unspecified 06/28/2023   Td 06/12/2022   Tdap 06/28/2018, 06/12/2022   Zoster Recombinant(Shingrix) 04/15/2019, 07/16/2019     Objective: Vital Signs: LMP  (LMP Unknown)    Physical Exam   Musculoskeletal Exam: ***  CDAI Exam: CDAI Score: -- Patient Global: --; Provider Global: -- Swollen: --; Tender: -- Joint Exam 02/07/2024   No joint exam has been  documented for this visit   There is currently no information documented on the homunculus. Go to the Rheumatology activity and complete the homunculus joint exam.  Investigation: No additional findings.  Imaging: No results found.  Recent Labs: Lab Results  Component Value Date   WBC 5.2 06/26/2023   HGB 13.4 06/26/2023   PLT 248 06/26/2023   NA 140 06/26/2023   K 4.3 06/26/2023   CL 105 06/26/2023   CO2 27 06/26/2023   GLUCOSE 80 06/26/2023   BUN 13 06/26/2023   CREATININE 0.82 06/26/2023   BILITOT 1.0 06/26/2023   ALKPHOS 52 10/20/2021   AST 26 06/26/2023   ALT 20 06/26/2023   PROT 6.8 06/26/2023   ALBUMIN  4.7 10/20/2021   CALCIUM  10.3 06/26/2023   GFRAA 94 02/08/2021   QFTBGOLDPLUS NEGATIVE 06/26/2023    Speciality Comments: PLQ Eye Exam: 10/02/2023 WNL [facility] Follow up in 1 year.   Inadequate response to methotrexate , Enbrel , Humira , Orencia -hives everywhere Rinvoq -pneumonia Arava -11/2021  Procedures:  No procedures performed Allergies: Orencia  [abatacept ], Shellfish allergy , Strawberry (diagnostic), Gluten meal, Lortab [hydrocodone-acetaminophen ], Morphine and codeine , and Augmentin  [amoxicillin -pot clavulanate]   Assessment / Plan:     Visit Diagnoses: Rheumatoid arthritis of multiple sites with negative rheumatoid factor (HCC)  High risk medication use  Positive ANA (antinuclear antibody)  Hair loss  Pain in both hands  Trochanteric bursitis of both hips  Primary osteoarthritis of both feet  Keratosis pilaris  Decreased cardiac ejection fraction  Hx of migraines  Dry scalp  Hot flashes  History of gastroesophageal reflux (GERD)  History of hypothyroidism  History of asthma  Orders: No orders of the defined types were placed in this encounter.  No orders of the defined types were placed in this encounter.   Face-to-face time spent with patient was *** minutes. Greater than 50% of time was spent in counseling and  coordination of care.  Follow-Up Instructions: No follow-ups on file.   Romayne Clubs, PA-C  Note - This record has been created using Dragon software.  Chart creation errors have been sought, but may not always  have been located. Such creation errors do not reflect on  the standard of medical care.

## 2024-01-28 NOTE — Telephone Encounter (Signed)
 Last Fill: 11/02/2023  Eye exam: 10/02/2023 WNL   Labs: 06/26/2023 CBC and CMP WNL Lipid panel WNL  Next Visit: 02/07/2024  Last Visit: 09/10/2023  [id number] arthritis of multiple sites with negative rheumatoid factor   Current Dose per office note 09/10/2023: Plaquenil  200 mg 1 tablet po BID   Patient has upcoming appointment on 02/07/2024, will update labs then.  Okay to refill Plaquenil ?/

## 2024-02-07 ENCOUNTER — Ambulatory Visit: Payer: 59 | Admitting: Physician Assistant

## 2024-02-07 DIAGNOSIS — Z8719 Personal history of other diseases of the digestive system: Secondary | ICD-10-CM

## 2024-02-07 DIAGNOSIS — M7061 Trochanteric bursitis, right hip: Secondary | ICD-10-CM

## 2024-02-07 DIAGNOSIS — R232 Flushing: Secondary | ICD-10-CM

## 2024-02-07 DIAGNOSIS — Z8709 Personal history of other diseases of the respiratory system: Secondary | ICD-10-CM

## 2024-02-07 DIAGNOSIS — L858 Other specified epidermal thickening: Secondary | ICD-10-CM

## 2024-02-07 DIAGNOSIS — R931 Abnormal findings on diagnostic imaging of heart and coronary circulation: Secondary | ICD-10-CM

## 2024-02-07 DIAGNOSIS — Z79899 Other long term (current) drug therapy: Secondary | ICD-10-CM

## 2024-02-07 DIAGNOSIS — M0609 Rheumatoid arthritis without rheumatoid factor, multiple sites: Secondary | ICD-10-CM

## 2024-02-07 DIAGNOSIS — M19071 Primary osteoarthritis, right ankle and foot: Secondary | ICD-10-CM

## 2024-02-07 DIAGNOSIS — R768 Other specified abnormal immunological findings in serum: Secondary | ICD-10-CM

## 2024-02-07 DIAGNOSIS — Z8639 Personal history of other endocrine, nutritional and metabolic disease: Secondary | ICD-10-CM

## 2024-02-07 DIAGNOSIS — L659 Nonscarring hair loss, unspecified: Secondary | ICD-10-CM

## 2024-02-07 DIAGNOSIS — R238 Other skin changes: Secondary | ICD-10-CM

## 2024-02-07 DIAGNOSIS — Z8669 Personal history of other diseases of the nervous system and sense organs: Secondary | ICD-10-CM

## 2024-02-07 DIAGNOSIS — M79641 Pain in right hand: Secondary | ICD-10-CM

## 2024-02-13 NOTE — Progress Notes (Unsigned)
 Office Visit Note  Patient: [name]             Date of Birth: May 27, 1966           MRN: [id number]             PCP: Merced Stair, NP Referring: Allwardt, Deleta Felix, PA-C Visit Date: 02/14/2024 Occupation: @GUAROCC @  Subjective:  Medication monitoring  History of Present Illness: [name] is a 58 y.o. female with history of seronegative rheumatoid arthritis and osteoarthritis.  Patient remains on  Xeljanz  5 mg 1 tablet by mouth daily and Plaquenil  200 mg 1 tablet po BID. she is tolerating combination therapy without any side effects and has not had any recent gaps in therapy.  She denies any signs or symptoms of a rheumatoid arthritis flare.  She has occasional stiffness and discomfort in the right wrist and hand which she typically notices with grip strength and grasping.  She denies any joint swelling at this time.  She has been taking diclofenac  tablets sparingly for symptomatic relief.  She denies any recent or recurrent infections.   Activities of Daily Living:  Patient reports morning stiffness for 1-2 hours.   Patient Denies nocturnal pain.  Difficulty dressing/grooming: Denies Difficulty climbing stairs: Denies Difficulty getting out of chair: Denies Difficulty using hands for taps, buttons, cutlery, and/or writing: Reports  Review of Systems  Constitutional:  Negative for fatigue.  HENT:  Negative for mouth sores and mouth dryness.   Eyes:  Positive for dryness.  Respiratory:  Negative for shortness of breath.   Cardiovascular:  Negative for chest pain and palpitations.  Gastrointestinal:  Positive for constipation. Negative for blood in stool and diarrhea.  Endocrine: Negative for increased urination.  Genitourinary:  Negative for involuntary urination.  Musculoskeletal:  Positive for joint pain, joint pain and morning stiffness. Negative for gait problem, joint swelling, myalgias, muscle weakness, muscle tenderness and myalgias.  Skin:  Positive for hair loss  and sensitivity to sunlight. Negative for color change and rash.  Allergic/Immunologic: Negative for susceptible to infections.  Neurological:  Positive for headaches. Negative for dizziness.  Hematological:  Negative for swollen glands.  Psychiatric/Behavioral:  Positive for sleep disturbance. Negative for depressed mood. The patient is not nervous/anxious.     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   Primary osteoarthritis of both feet 10/09/2018   Hx of migraines 09/27/2018   History of gastroesophageal reflux (GERD) 09/27/2018   Palindromic rheumatism, 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   Moderate persistent asthma 07/05/2018   Hypothyroid 06/28/2018    Past Medical History:  Diagnosis Date   Arthritis    ra zero negative   Asthma    mild/moderate persistent asthma   Complication of anesthesia    slow to awaken 25 yrs ago   Elevated liver function tests dr Sharyon Deis pcp manages   for last year   Family history of breast cancer    Fibroid    Fractured rib    Frequent headaches    GERD (gastroesophageal reflux disease)    Gluten intolerance  H/O seasonal allergies    History of chicken pox    History of COVID-19 07/2019   high fever sob, x 7 days all symptoms resolved   Injury of peroneal tendon of left foot, initial encounter    Left bundle branch block 02/2019   Dr.Paula Ross   Migraines    Pneumonia    PONV (postoperative nausea and vomiting)    ponv with several surgeries, likes scopolamine  patch   Thyroid  disease    Multiple benign nodules--thyroid   removed    Family History  Problem Relation Age of Onset   Arthritis Mother    Cancer Mother 25       breast ca--dec age 75   Miscarriages / [place] Mother    Breast cancer Mother 49   Glaucoma Father    Diabetes Father    Heart attack Father    Heart disease Father        pacemaker    Hyperlipidemia Father    Hypertension Father    Stroke Father        x4   COPD Sister    Peripheral Artery Disease Sister    Asthma Brother    Birth defects Brother    Cancer Brother        skin   Hyperlipidemia Brother    Alcohol abuse Brother    Early death Brother        suicide   Mental illness Brother    Breast cancer Maternal Aunt 60   Kidney cancer Paternal Aunt 20   Breast cancer Paternal Aunt 20   Throat cancer Paternal Uncle    Thyroid  cancer Paternal Uncle    Heart attack Maternal Grandmother    Heart disease Maternal Grandmother    Hyperlipidemia Maternal Grandmother    Stroke Maternal Grandmother    Alcohol abuse Paternal Grandmother    Cancer Paternal Grandmother        colon   Cancer Paternal Grandfather        breast   Heart attack Paternal Grandfather    Breast cancer Paternal Grandfather 56   Asthma Daughter    Ehlers-Danlos syndrome Daughter    Fibromyalgia Daughter    Asthma Son    Asthma Son    Breast cancer Cousin 45   Prostate cancer Cousin    Past Surgical History:  Procedure Laterality Date   ANTERIOR AND POSTERIOR REPAIR N/A 06/14/2020   Procedure: ANTERIOR (CYSTOCELE) AND POSTERIOR REPAIR (RECTOCELE);  Surgeon: Greta Leatherwood, MD;  Location: [facility];  Service: Gynecology;  Laterality: N/A;   BLADDER SUSPENSION N/A 06/14/2020   Procedure: TRANSVAGINAL TAPE (TVT) PROCEDURE/EXACT MIDURETHRAL SLING;  Surgeon: Greta Leatherwood, MD;  Location: [facility];  Service: Gynecology;  Laterality: N/A;  EXACT MIDURETHRAL SLING   CARDIAC CATHETERIZATION  2009   results normal done in [place]     CHOLECYSTECTOMY  1995   laparoscopic   CYSTOSCOPY N/A 06/14/2020   Procedure: CYSTOSCOPY;  Surgeon: Greta Leatherwood, MD;  Location: [facility];  Service: Gynecology;  Laterality: N/A;   HAMMER TOE SURGERY Bilateral 2009   HERNIA REPAIR  2015   Has abdominal wall mesh - [place], [place] .  Laparoscopic incisional hernia repari with 7 x 9 inch Ventralight ST with Echo mesh   LAPAROSCOPIC HYSTERECTOMY  2007   Supracervical hysterectomy with cystosctopy [place] partial   THYROID  SURGERY  2013   total thyroidectomy due to 11 nodules  Social History   Social History Narrative   Right Handed   Lives in a two story home   Immunization History  Administered Date(s) Administered   Influenza,inj,Quad PF,6+ Mos 06/28/2018, 06/18/2019, 06/12/2022   Influenza-Unspecified 08/05/2020, 05/25/2021, 06/12/2022, 06/28/2023   Moderna Sars-Covid-2 Vaccination 01/27/2021   PFIZER(Purple Top)SARS-COV-2 Vaccination 12/08/2019, 01/05/2020, 08/05/2020   Pneumococcal Polysaccharide-23 10/22/2018   Pneumococcal-Unspecified 06/28/2023   Td 06/12/2022   Tdap 06/28/2018, 06/12/2022   Zoster Recombinant(Shingrix) 04/15/2019, 07/16/2019     Objective: Vital Signs: BP 106/73 (BP Location: Left Arm, Patient Position: Sitting)   Pulse 80   Resp 16   Ht 5\' 7"  (1.702 m)   Wt 160 lb 9.6 oz (72.8 kg)   LMP  (LMP Unknown)   BMI 25.15 kg/m    Physical Exam Vitals and nursing note reviewed.  Constitutional:      Appearance: She is well-developed.  HENT:     Head: Normocephalic and atraumatic.  Eyes:     Conjunctiva/sclera: Conjunctivae normal.  Cardiovascular:     Rate and Rhythm: Normal rate and regular rhythm.     Heart sounds: Normal heart sounds.  Pulmonary:     Effort: Pulmonary effort is normal.     Breath sounds: Normal breath sounds.  Abdominal:     General: Bowel sounds are normal.     Palpations: Abdomen is soft.  Musculoskeletal:     Cervical back: Normal  range of motion.  Lymphadenopathy:     Cervical: No cervical adenopathy.  Skin:    General: Skin is warm and dry.     Capillary Refill: Capillary refill takes less than 2 seconds.  Neurological:     Mental Status: She is alert and oriented to person, place, and time.  Psychiatric:        Behavior: Behavior normal.      Musculoskeletal Exam: C-spine, thoracic spine, lumbar spine have good range of motion.  Shoulder joints, elbow joints, wrist joints, MCPs, PIPs, DIPs have good range of motion with no synovitis.  Complete fist formation bilaterally.  Hip joints have good range of motion with no groin pain.  Knee joints have good range of motion with no warmth or effusion.  Ankle joints have good range of motion without tenderness or joint swelling.  CDAI Exam: CDAI Score: -- Patient Global: --; Provider Global: -- Swollen: --; Tender: -- Joint Exam 02/14/2024   No joint exam has been documented for this visit   There is currently no information documented on the homunculus. Go to the Rheumatology activity and complete the homunculus joint exam.  Investigation: No additional findings.  Imaging: No results found.  Recent Labs: Lab Results  Component Value Date   WBC 5.2 06/26/2023   HGB 13.4 06/26/2023   PLT 248 06/26/2023   NA 140 06/26/2023   K 4.3 06/26/2023   CL 105 06/26/2023   CO2 27 06/26/2023   GLUCOSE 80 06/26/2023   BUN 13 06/26/2023   CREATININE 0.82 06/26/2023   BILITOT 1.0 06/26/2023   ALKPHOS 52 10/20/2021   AST 26 06/26/2023   ALT 20 06/26/2023   PROT 6.8 06/26/2023   ALBUMIN 4.7 10/20/2021   CALCIUM  10.3 06/26/2023   GFRAA 94 02/08/2021   QFTBGOLDPLUS NEGATIVE 06/26/2023    Speciality Comments: PLQ Eye Exam: 10/02/2023 WNL  @[facility]   Inadequate response to methotrexate , Enbrel , Humira , Orencia -hives everywhere Rinvoq -pneumonia Arava -11/2021  Procedures:  No procedures performed Allergies: Orencia  [abatacept ], Shellfish allergy ,  Strawberry (diagnostic), Gluten meal, Lortab [hydrocodone-acetaminophen ], Morphine and codeine ,  and Augmentin  [amoxicillin -pot clavulanate]     Assessment / Plan:     Visit Diagnoses: Rheumatoid arthritis of multiple sites with negative rheumatoid factor (HCC) - Diagnosed at [place]  [facility].  Previous ultrasound examination performed showed synovitis in bilateral hands: She has no joint tenderness or synovitis on examination today.  She has not had any signs or symptoms of a rheumatoid arthritis flare.  She has clinically been doing well taking Xeljanz  5 mg 1 tablet by mouth daily and Plaquenil  200 mg 1 tablet by mouth twice daily.  She is tolerating combination therapy without any side effects and has not had any recent gaps in therapy.  No recent or recurrent infections.  She has not been experiencing any nocturnal pain or difficulty performing ADLs.  At times she has some increased stiffness and discomfort in the right wrist and hand but has no active inflammation at this time.  She has been taking oral diclofenac  sparingly for symptomatic relief.  No medication changes will be made at this time.  She was advised to notify us  if she develops signs or symptoms of a flare.  She will follow-up in the office in 5 months or sooner if needed.  High risk medication use - Xeljanz  5 mg 1 tablet by mouth daily, Plaquenil  200 mg 1 tablet po BID.  Previous therapy: Enbrel , Humira , Orencia , Rinvoq , methotrexate , Kevzara ,Arava .  CBC and BMP updated on 01/08/24.  Patient plans to return for updated lab work in July and every 3 months to monitor for toxicity.  New orders for CBC and CMP were placed today. Lipid panel WNL on 01/08/24  TB gold negative on 06/26/23.  PLQ Eye Exam: 10/02/2023 WNL @[facility]  No recent or recurrent infections.  Discussed the importance of holding xeljanz  if she develops signs or symptoms of an infection and to resume once the infection has completely cleared.  -  Plan: CBC with Differential/Platelet, Comprehensive metabolic panel with GFR  Positive ANA (antinuclear antibody) - 04/16/2023: ANA positive, RNP +4.8, dsDNA-, Smith-, SCL 70-, Ro-, La- antichromatin antibodies negative, anti-Jo1 negative, anticentromere B antibodies negative.    Pain in both hands: Parents has occasional stiffness and discomfort in the right hand and wrist joint but has not had any active inflammation.  No synovitis was noted on examination today.  She was able to make a complete fist bilaterally.  She is advised to notify us  if she develops any new or worsening symptoms.  No medication changes will be made at this time.  Trochanteric bursitis of both hips: Not currently symptomatic.  Primary osteoarthritis of both feet - Under care of Dr. Jacqulyne Maxim. X-rays of both feet from 09/27/2018 were reviewed today and are consistent w/ OA and postsurgical changes. Tried PT. overall the discomfort in her feet has improved.  She is wearing proper fitting shoes.  She is good range of motion of both ankle joints with no tenderness or synovitis.  Other medical conditions are listed as follows:   Keratosis pilaris  Decreased cardiac ejection fraction  Hx of migraines  Dry scalp  Hair loss: Discontinued arava .  Hot flashes  History of gastroesophageal reflux (GERD)  History of hypothyroidism  History of asthma  Orders: Orders Placed This Encounter  Procedures   CBC with Differential/Platelet   Comprehensive metabolic panel with GFR   No orders of the defined types were placed in this encounter.   Follow-Up Instructions: Return in about 5 months (around 07/16/2024) for Rheumatoid arthritis.   [name]  Eddie Good, PA-C  Note - This record has been created using [employer].  Chart creation errors have been sought, but may not always  have been located. Such creation errors do not reflect on  the standard of medical care.

## 2024-02-14 ENCOUNTER — Ambulatory Visit: Attending: Physician Assistant | Admitting: Physician Assistant

## 2024-02-14 ENCOUNTER — Encounter: Payer: Self-pay | Admitting: Physician Assistant

## 2024-02-14 ENCOUNTER — Other Ambulatory Visit: Payer: Self-pay

## 2024-02-14 VITALS — BP 106/73 | HR 80 | Resp 16 | Ht 67.0 in | Wt 160.6 lb

## 2024-02-14 DIAGNOSIS — L659 Nonscarring hair loss, unspecified: Secondary | ICD-10-CM | POA: Diagnosis not present

## 2024-02-14 DIAGNOSIS — L858 Other specified epidermal thickening: Secondary | ICD-10-CM

## 2024-02-14 DIAGNOSIS — Z8669 Personal history of other diseases of the nervous system and sense organs: Secondary | ICD-10-CM

## 2024-02-14 DIAGNOSIS — R238 Other skin changes: Secondary | ICD-10-CM

## 2024-02-14 DIAGNOSIS — M0609 Rheumatoid arthritis without rheumatoid factor, multiple sites: Secondary | ICD-10-CM

## 2024-02-14 DIAGNOSIS — R768 Other specified abnormal immunological findings in serum: Secondary | ICD-10-CM | POA: Diagnosis not present

## 2024-02-14 DIAGNOSIS — Z8719 Personal history of other diseases of the digestive system: Secondary | ICD-10-CM

## 2024-02-14 DIAGNOSIS — M19071 Primary osteoarthritis, right ankle and foot: Secondary | ICD-10-CM

## 2024-02-14 DIAGNOSIS — M79641 Pain in right hand: Secondary | ICD-10-CM

## 2024-02-14 DIAGNOSIS — Z79899 Other long term (current) drug therapy: Secondary | ICD-10-CM

## 2024-02-14 DIAGNOSIS — R232 Flushing: Secondary | ICD-10-CM

## 2024-02-14 DIAGNOSIS — Z8709 Personal history of other diseases of the respiratory system: Secondary | ICD-10-CM

## 2024-02-14 DIAGNOSIS — Z8639 Personal history of other endocrine, nutritional and metabolic disease: Secondary | ICD-10-CM

## 2024-02-14 DIAGNOSIS — M19072 Primary osteoarthritis, left ankle and foot: Secondary | ICD-10-CM

## 2024-02-14 DIAGNOSIS — M79642 Pain in left hand: Secondary | ICD-10-CM

## 2024-02-14 DIAGNOSIS — R7689 Other specified abnormal immunological findings in serum: Secondary | ICD-10-CM

## 2024-02-14 DIAGNOSIS — M7061 Trochanteric bursitis, right hip: Secondary | ICD-10-CM

## 2024-02-14 DIAGNOSIS — R931 Abnormal findings on diagnostic imaging of heart and coronary circulation: Secondary | ICD-10-CM

## 2024-02-14 DIAGNOSIS — M7062 Trochanteric bursitis, left hip: Secondary | ICD-10-CM

## 2024-02-14 MED ORDER — XELJANZ 5 MG PO TABS: 5.0000 mg | ORAL_TABLET | Freq: Every day | ORAL | 0 refills | Status: DC

## 2024-02-14 NOTE — Telephone Encounter (Signed)
 Last Fill: 10/18/2023  Labs: 06/26/2023 CBC and CMP WNL   (Updated labs today results pending)  TB Gold: 06/26/2023 TB gold negative    Next Visit: 07/17/2024  Last Visit: 02/14/2024  [id number] arthritis of multiple sites with negative rheumatoid factor   Current Dose per office note 02/14/2024: Xeljanz  5 mg 1 tablet by mouth daily   Okay to refill Xeljanz ?

## 2024-04-05 ENCOUNTER — Other Ambulatory Visit: Payer: Self-pay | Admitting: Physician Assistant

## 2024-04-05 DIAGNOSIS — M0609 Rheumatoid arthritis without rheumatoid factor, multiple sites: Secondary | ICD-10-CM

## 2024-04-05 DIAGNOSIS — Z79899 Other long term (current) drug therapy: Secondary | ICD-10-CM

## 2024-04-28 ENCOUNTER — Other Ambulatory Visit: Payer: Self-pay | Admitting: Family

## 2024-04-28 DIAGNOSIS — Z1231 Encounter for screening mammogram for malignant neoplasm of breast: Secondary | ICD-10-CM

## 2024-04-29 ENCOUNTER — Other Ambulatory Visit: Payer: Self-pay | Admitting: *Deleted

## 2024-04-29 DIAGNOSIS — Z79899 Other long term (current) drug therapy: Secondary | ICD-10-CM

## 2024-04-29 LAB — CBC WITH DIFFERENTIAL/PLATELET
Absolute Lymphocytes: 2296 {cells}/uL (ref 850–3900)
Absolute Monocytes: 398 {cells}/uL (ref 200–950)
Basophils Absolute: 39 {cells}/uL (ref 0–200)
Basophils Relative: 0.7 %
Eosinophils Absolute: 90 {cells}/uL (ref 15–500)
Eosinophils Relative: 1.6 %
HCT: 39.5 % (ref 35.0–45.0)
Hemoglobin: 12.9 g/dL (ref 11.7–15.5)
MCH: 28.7 pg (ref 27.0–33.0)
MCHC: 32.7 g/dL (ref 32.0–36.0)
MCV: 88 fL (ref 80.0–100.0)
MPV: 11.4 fL (ref 7.5–12.5)
Monocytes Relative: 7.1 %
Neutro Abs: 2778 {cells}/uL (ref 1500–7800)
Neutrophils Relative %: 49.6 %
Platelets: 267 Thousand/uL (ref 140–400)
RBC: 4.49 Million/uL (ref 3.80–5.10)
RDW: 12.8 % (ref 11.0–15.0)
Total Lymphocyte: 41 %
WBC: 5.6 Thousand/uL (ref 3.8–10.8)

## 2024-04-29 LAB — COMPREHENSIVE METABOLIC PANEL WITH GFR
AG Ratio: 2.2 (calc) (ref 1.0–2.5)
ALT: 20 U/L (ref 6–29)
AST: 29 U/L (ref 10–35)
Albumin: 4.6 g/dL (ref 3.6–5.1)
Alkaline phosphatase (APISO): 46 U/L (ref 37–153)
BUN: 15 mg/dL (ref 7–25)
CO2: 28 mmol/L (ref 20–32)
Calcium: 9.6 mg/dL (ref 8.6–10.4)
Chloride: 104 mmol/L (ref 98–110)
Creat: 0.9 mg/dL (ref 0.50–1.03)
Globulin: 2.1 g/dL (ref 1.9–3.7)
Glucose, Bld: 76 mg/dL (ref 65–99)
Potassium: 4.3 mmol/L (ref 3.5–5.3)
Sodium: 139 mmol/L (ref 135–146)
Total Bilirubin: 0.8 mg/dL (ref 0.2–1.2)
Total Protein: 6.7 g/dL (ref 6.1–8.1)
eGFR: 75 mL/min/1.73m2 (ref >=60)

## 2024-04-30 ENCOUNTER — Ambulatory Visit: Payer: Self-pay | Admitting: Physician Assistant

## 2024-04-30 NOTE — Progress Notes (Signed)
 CBC and CMP WNL

## 2024-05-01 ENCOUNTER — Other Ambulatory Visit: Payer: Self-pay | Admitting: Physician Assistant

## 2024-05-01 DIAGNOSIS — M1230 Palindromic rheumatism, unspecified site: Secondary | ICD-10-CM

## 2024-05-01 NOTE — Telephone Encounter (Signed)
 Last Fill: 02/06/2024  Eye exam: 10/02/2023 WNL @ [facility]    Labs: 04/29/2024 CBC and CMP WNL  Next Visit: 07/17/2024  Last Visit: 02/14/2024  [id number] arthritis of multiple sites with negative rheumatoid factor.  Current Dose per office note 02/14/2024:  Plaquenil  200 mg 1 tablet by mouth twice a day  .  Okay to refill Plaquenil ?

## 2024-05-02 ENCOUNTER — Other Ambulatory Visit: Payer: Self-pay

## 2024-05-02 DIAGNOSIS — M0609 Rheumatoid arthritis without rheumatoid factor, multiple sites: Secondary | ICD-10-CM

## 2024-05-02 DIAGNOSIS — Z79899 Other long term (current) drug therapy: Secondary | ICD-10-CM

## 2024-05-02 MED ORDER — XELJANZ 5 MG PO TABS: 5.0000 mg | ORAL_TABLET | Freq: Every day | ORAL | 0 refills | Status: DC

## 2024-05-02 NOTE — Telephone Encounter (Signed)
 Last Fill: 02/14/2024  Labs: 04/29/2024 CBC and CMP WNL   TB Gold: 06/26/2023 TB Gold Negative    Next Visit: 07/17/2024  Last Visit: 02/14/2024  DX: Rheumatoid arthritis of multiple sites with negative rheumatoid factor   Current Dose per office note 02/14/2024: Xeljanz  5 mg 1 tablet by mouth dail   Okay to refill Xeljanz ?

## 2024-05-14 ENCOUNTER — Ambulatory Visit

## 2024-05-14 DIAGNOSIS — Z1231 Encounter for screening mammogram for malignant neoplasm of breast: Secondary | ICD-10-CM

## 2024-06-27 ENCOUNTER — Other Ambulatory Visit: Payer: Self-pay | Admitting: Physician Assistant

## 2024-06-27 DIAGNOSIS — M0609 Rheumatoid arthritis without rheumatoid factor, multiple sites: Secondary | ICD-10-CM

## 2024-06-27 DIAGNOSIS — Z79899 Other long term (current) drug therapy: Secondary | ICD-10-CM

## 2024-06-27 NOTE — Telephone Encounter (Signed)
 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  Okay to refill Xeljanz ?

## 2024-07-03 NOTE — Progress Notes (Deleted)
 Office Visit Note  Patient: [name]             Date of Birth: 10-31-1965           MRN: [id number]             PCP: Sherre Geni LABOR, NP Referring: Sherre Geni LABOR, NP Visit Date: 07/17/2024 Occupation: Data Unavailable  Subjective:  No chief complaint on file.   History of Present Illness: [name] is a 58 y.o. female ***     Activities of Daily Living:  Patient reports morning stiffness for *** {minute/hour:19697}.   Patient {ACTIONS;DENIES/REPORTS:21021675::Denies} nocturnal pain.  Difficulty dressing/grooming: {ACTIONS;DENIES/REPORTS:21021675::Denies} Difficulty climbing stairs: {ACTIONS;DENIES/REPORTS:21021675::Denies} Difficulty getting out of chair: {ACTIONS;DENIES/REPORTS:21021675::Denies} Difficulty using hands for taps, buttons, cutlery, and/or writing: {ACTIONS;DENIES/REPORTS:21021675::Denies}  No Rheumatology ROS completed.   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   Primary osteoarthritis of both feet 10/09/2018   Hx of migraines 09/27/2018   History of gastroesophageal reflux (GERD) 09/27/2018   Palindromic rheumatism, 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   Moderate persistent asthma 07/05/2018   Hypothyroid 06/28/2018    Past Medical History:  Diagnosis Date   Arthritis    ra zero negative   Asthma    mild/moderate persistent asthma   Complication of anesthesia    slow to awaken 25 yrs ago   Elevated liver function tests dr dionne geralds pcp manages   for last year   Family history of breast cancer    Fibroid    Fractured rib     Frequent headaches    GERD (gastroesophageal reflux disease)    Gluten intolerance    H/O seasonal allergies    History of chicken pox    History of COVID-19 07/2019   high fever sob, x 7 days all symptoms resolved   Injury of peroneal tendon of left foot, initial encounter    Left bundle branch block 02/2019   Dr.Paula Ross   Migraines    Pneumonia    PONV (postoperative nausea and vomiting)    ponv with several surgeries, likes scopolamine  patch   Thyroid  disease    Multiple benign nodules--thyroid  removed    Family History  Problem Relation Age of Onset   Arthritis Mother    Cancer Mother 46       breast ca--dec age 56   Miscarriages / [place] Mother    Breast cancer Mother 88   Glaucoma Father    Diabetes Father    Heart attack Father    Heart disease Father        pacemaker    Hyperlipidemia Father    Hypertension Father    Stroke Father        x4   COPD Sister    Peripheral Artery Disease Sister    Asthma Brother    Birth defects Brother    Cancer Brother        skin   Hyperlipidemia Brother    Alcohol abuse Brother    Early death Brother        suicide   Mental illness Brother  Breast cancer Maternal Aunt 60   Kidney cancer Paternal Aunt 87   Breast cancer Paternal Aunt 21   Throat cancer Paternal Uncle    Thyroid  cancer Paternal Uncle    Heart attack Maternal Grandmother    Heart disease Maternal Grandmother    Hyperlipidemia Maternal Grandmother    Stroke Maternal Grandmother    Alcohol abuse Paternal Grandmother    Cancer Paternal Grandmother        colon   Cancer Paternal Grandfather        breast   Heart attack Paternal Grandfather    Breast cancer Paternal Grandfather 1   Asthma Daughter    Ehlers-Danlos syndrome Daughter    Fibromyalgia Daughter    Asthma Son    Asthma Son    Breast cancer Cousin 45   Prostate cancer Cousin    Past Surgical History:  Procedure Laterality Date   ANTERIOR AND POSTERIOR REPAIR N/A 06/14/2020    Procedure: ANTERIOR (CYSTOCELE) AND POSTERIOR REPAIR (RECTOCELE);  Surgeon: Cathlyn JAYSON Nikki Bobie FORBES, MD;  Location: [facility];  Service: Gynecology;  Laterality: N/A;   BLADDER SUSPENSION N/A 06/14/2020   Procedure: TRANSVAGINAL TAPE (TVT) PROCEDURE/EXACT MIDURETHRAL SLING;  Surgeon: Cathlyn JAYSON Nikki Bobie FORBES, MD;  Location: [facility];  Service: Gynecology;  Laterality: N/A;  EXACT MIDURETHRAL SLING   CARDIAC CATHETERIZATION  2009   results normal done in [place]    CHOLECYSTECTOMY  1995   laparoscopic   CYSTOSCOPY N/A 06/14/2020   Procedure: CYSTOSCOPY;  Surgeon: Cathlyn JAYSON Nikki Bobie FORBES, MD;  Location: [facility];  Service: Gynecology;  Laterality: N/A;   HAMMER TOE SURGERY Bilateral 2009   HERNIA REPAIR  2015   Has abdominal wall mesh - [place], [place] .  Laparoscopic incisional hernia repari with 7 x 9 inch Ventralight ST with Echo mesh   LAPAROSCOPIC HYSTERECTOMY  2007   Supracervical hysterectomy with cystosctopy [place] partial   THYROID  SURGERY  2013   total thyroidectomy due to 11 nodules   Social History   Tobacco Use   Smoking status: Never    Passive exposure: Never   Smokeless tobacco: Never  Vaping Use   Vaping status: Never Used  Substance Use Topics   Alcohol use: Yes    Alcohol/week: 1.0 standard drink of alcohol    Types: 1 Glasses of wine per week    Comment: occ   Drug use: Never   Social History   Social History Narrative   Right Handed   Lives in a two story home     Immunization History  Administered Date(s) Administered   Influenza,inj,Quad PF,6+ Mos 06/28/2018, 06/18/2019, 06/12/2022   Influenza-Unspecified 08/05/2020, 05/25/2021, 06/12/2022, 06/28/2023   Moderna Sars-Covid-2 Vaccination 01/27/2021   PFIZER(Purple Top)SARS-COV-2 Vaccination 12/08/2019, 01/05/2020, 08/05/2020   Pneumococcal Polysaccharide-23 10/22/2018   Pneumococcal-Unspecified 06/28/2023   Td 06/12/2022   Tdap  06/28/2018, 06/12/2022   Zoster Recombinant(Shingrix) 04/15/2019, 07/16/2019     Objective: Vital Signs: LMP  (LMP Unknown)    Physical Exam   Musculoskeletal Exam: ***  CDAI Exam: CDAI Score: -- Patient Global: --; Provider Global: -- Swollen: --; Tender: -- Joint Exam 07/17/2024   No joint exam has been documented for this visit   There is currently no information documented on the homunculus. Go to the Rheumatology activity and complete the homunculus joint exam.  Investigation: No additional findings.  Imaging: No results found.  Recent Labs: Lab Results  Component Value Date   WBC 5.6  04/29/2024   HGB 12.9 04/29/2024   PLT 267 04/29/2024   NA 139 04/29/2024   K 4.3 04/29/2024   CL 104 04/29/2024   CO2 28 04/29/2024   GLUCOSE 76 04/29/2024   BUN 15 04/29/2024   CREATININE 0.90 04/29/2024   BILITOT 0.8 04/29/2024   ALKPHOS 52 10/20/2021   AST 29 04/29/2024   ALT 20 04/29/2024   PROT 6.7 04/29/2024   ALBUMIN 4.7 10/20/2021   CALCIUM  9.6 04/29/2024   GFRAA 94 02/08/2021   QFTBGOLDPLUS NEGATIVE 06/26/2023    Speciality Comments: PLQ Eye Exam: 10/02/2023 [facility]   Inadequate response to methotrexate , Enbrel , Humira , Orencia -hives everywhere Rinvoq -pneumonia Arava -11/2021  Procedures:  No procedures performed Allergies: Orencia  [abatacept ], Shellfish allergy , Strawberry (diagnostic), Gluten meal, Lortab [hydrocodone-acetaminophen ], Morphine and codeine , and Augmentin  [amoxicillin -pot clavulanate]   Assessment / Plan:     Visit Diagnoses: Rheumatoid arthritis of multiple sites with negative rheumatoid factor (HCC)  High risk medication use  Palindromic rheumatism  Positive ANA (antinuclear antibody)  Hair loss  Pain in both hands  Decreased cardiac ejection fraction  Trochanteric bursitis of both hips  Primary osteoarthritis of both feet  Keratosis pilaris  Hx of migraines  History of gastroesophageal reflux (GERD)  History  of hypothyroidism  History of asthma  Orders: No orders of the defined types were placed in this encounter.  No orders of the defined types were placed in this encounter.   Face-to-face time spent with patient was *** minutes. Greater than 50% of time was spent in counseling and coordination of care.  Follow-Up Instructions: No follow-ups on file.   Waddell CHRISTELLA Craze, PA-C  Note - This record has been created using Dragon software.  Chart creation errors have been sought, but may not always  have been located. Such creation errors do not reflect on  the standard of medical care.

## 2024-07-17 ENCOUNTER — Ambulatory Visit: Admitting: Physician Assistant

## 2024-07-17 DIAGNOSIS — Z8639 Personal history of other endocrine, nutritional and metabolic disease: Secondary | ICD-10-CM

## 2024-07-17 DIAGNOSIS — M19071 Primary osteoarthritis, right ankle and foot: Secondary | ICD-10-CM

## 2024-07-17 DIAGNOSIS — L858 Other specified epidermal thickening: Secondary | ICD-10-CM

## 2024-07-17 DIAGNOSIS — R7689 Other specified abnormal immunological findings in serum: Secondary | ICD-10-CM

## 2024-07-17 DIAGNOSIS — Z8669 Personal history of other diseases of the nervous system and sense organs: Secondary | ICD-10-CM

## 2024-07-17 DIAGNOSIS — R931 Abnormal findings on diagnostic imaging of heart and coronary circulation: Secondary | ICD-10-CM

## 2024-07-17 DIAGNOSIS — M79642 Pain in left hand: Secondary | ICD-10-CM

## 2024-07-17 DIAGNOSIS — M1230 Palindromic rheumatism, unspecified site: Secondary | ICD-10-CM

## 2024-07-17 DIAGNOSIS — Z79899 Other long term (current) drug therapy: Secondary | ICD-10-CM

## 2024-07-17 DIAGNOSIS — M7061 Trochanteric bursitis, right hip: Secondary | ICD-10-CM

## 2024-07-17 DIAGNOSIS — M0609 Rheumatoid arthritis without rheumatoid factor, multiple sites: Secondary | ICD-10-CM

## 2024-07-17 DIAGNOSIS — Z8709 Personal history of other diseases of the respiratory system: Secondary | ICD-10-CM

## 2024-07-17 DIAGNOSIS — L659 Nonscarring hair loss, unspecified: Secondary | ICD-10-CM

## 2024-07-17 DIAGNOSIS — Z8719 Personal history of other diseases of the digestive system: Secondary | ICD-10-CM

## 2024-07-27 ENCOUNTER — Other Ambulatory Visit: Payer: Self-pay | Admitting: Physician Assistant

## 2024-07-27 DIAGNOSIS — M1230 Palindromic rheumatism, unspecified site: Secondary | ICD-10-CM

## 2024-07-28 NOTE — Telephone Encounter (Signed)
 Last Fill: 05/01/2024  Eye exam: 10/02/2023 WNL   Labs: 04/29/2024 CBC and CMP WNL   Next Visit: 08/12/2024  Last Visit: 02/14/2024  IK:Myzlfjunpi arthritis of multiple sites with negative rheumatoid factor (HCC)   Current Dose per office note 02/14/2024: Plaquenil  200 mg 1 tablet po BID   Okay to refill Plaquenil ?

## 2024-07-29 NOTE — Progress Notes (Unsigned)
 Office Visit Note  Patient: [name]             Date of Birth: 1966-09-02           MRN: [id number]             PCP: Sherre Geni LABOR, NP Referring: Sherre Geni LABOR, NP Visit Date: 08/12/2024 Occupation: Data Unavailable  Subjective:  Joint stiffness   History of Present Illness: Nyaira Caraher is a 58 y.o. female with history of seronegative rheumatoid arthritis.  Patient remains on Xeljanz  5 mg 1 tablet by mouth daily and Plaquenil  200 mg 1 tablet po BID. she is tolerating combination therapy without any side effects and has not had any recent gaps in therapy.  Patient has noticed some increased arthralgias and joint stiffness which she attributes to fall weather.  She has noticed some increased discomfort and stiffness in both feet as well as ongoing discomfort in the left knee.  Patient previous had an MRI of the left knee which revealed severe narrowing in the lateral compartment.  She is interested in proceeding with viscosupplementation for the left knee. She takes diclofenac  75 mg 1 tablet as needed for pain relief.  She will occasionally take Tylenol  but has been trying to avoid the use of Tylenol  and NSAIDs. She denies any recent or recurrent infections.  Activities of Daily Living:  Patient reports morning stiffness for all day. Patient Denies nocturnal pain.  Difficulty dressing/grooming: Reports Difficulty climbing stairs: Reports Difficulty getting out of chair: Reports Difficulty using hands for taps, buttons, cutlery, and/or writing: Reports  Review of Systems  Constitutional:  Negative for fatigue.  HENT:  Positive for mouth dryness. Negative for mouth sores.   Eyes:  Positive for dryness.  Respiratory:  Positive for shortness of breath.   Cardiovascular:  Positive for palpitations. Negative for chest pain.  Gastrointestinal:  Positive for constipation. Negative for blood in stool and diarrhea.  Endocrine: Negative for increased urination.  Genitourinary:  Negative  for involuntary urination.  Musculoskeletal:  Positive for joint pain, gait problem, joint pain, joint swelling and morning stiffness. Negative for myalgias, muscle weakness, muscle tenderness and myalgias.  Skin:  Positive for color change and hair loss. Negative for rash and sensitivity to sunlight.  Allergic/Immunologic: Negative for susceptible to infections.  Neurological:  Positive for headaches. Negative for dizziness.  Hematological:  Negative for swollen glands.  Psychiatric/Behavioral:  Positive for sleep disturbance. Negative for depressed mood. The patient is not nervous/anxious.     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   Primary osteoarthritis of both feet 10/09/2018   Hx of migraines 09/27/2018   History of gastroesophageal reflux (GERD) 09/27/2018   Palindromic rheumatism, 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   Moderate persistent asthma 07/05/2018   Hypothyroid 06/28/2018    Past Medical History:  Diagnosis Date   Arthritis    ra zero negative   Asthma    mild/moderate persistent asthma   Complication of anesthesia    slow to awaken 25 yrs ago   Elevated liver function tests dr dionne geralds pcp manages  for last year   Family history of breast cancer    Fibroid    Fractured rib    Frequent headaches    GERD (gastroesophageal reflux disease)    Gluten intolerance    H/O seasonal allergies    History of chicken pox    History of COVID-19 07/2019   high fever sob, x 7 days all symptoms resolved   Injury of peroneal tendon of left foot, initial encounter     Left bundle branch block 02/2019   Dr.Paula Ross   Migraines    Pneumonia    PONV (postoperative nausea and vomiting)    ponv with several surgeries, likes scopolamine  patch   Thyroid  disease    Multiple benign nodules--thyroid  removed    Family History  Problem Relation Age of Onset   Arthritis Mother    Cancer Mother 71       breast ca--dec age 98   Miscarriages / Stillbirths Mother    Breast cancer Mother 85   Glaucoma Father    Diabetes Father    Heart attack Father    Heart disease Father        pacemaker    Hyperlipidemia Father    Hypertension Father    Stroke Father        x4   COPD Sister    Peripheral Artery Disease Sister    Asthma Brother    Birth defects Brother    Cancer Brother        skin   Hyperlipidemia Brother    Alcohol abuse Brother    Early death Brother        suicide   Mental illness Brother    Breast cancer Maternal Aunt 60   Kidney cancer Paternal Aunt 62   Breast cancer Paternal Aunt 4   Throat cancer Paternal Uncle    Thyroid  cancer Paternal Uncle    Heart attack Maternal Grandmother    Heart disease Maternal Grandmother    Hyperlipidemia Maternal Grandmother    Stroke Maternal Grandmother    Alcohol abuse Paternal Grandmother    Cancer Paternal Grandmother        colon   Cancer Paternal Grandfather        breast   Heart attack Paternal Grandfather    Breast cancer Paternal Grandfather 13   Asthma Daughter    Ehlers-Danlos syndrome Daughter    Fibromyalgia Daughter    Asthma Son    Asthma Son    Breast cancer Cousin 45   Prostate cancer Cousin    Past Surgical History:  Procedure Laterality Date   ANTERIOR AND POSTERIOR REPAIR N/A 06/14/2020   Procedure: ANTERIOR (CYSTOCELE) AND POSTERIOR REPAIR (RECTOCELE);  Surgeon: Cathlyn JAYSON Nikki Bobie FORBES, MD;  Location: [facility];  Service: Gynecology;  Laterality: N/A;   BLADDER SUSPENSION N/A 06/14/2020   Procedure: TRANSVAGINAL TAPE (TVT) PROCEDURE/EXACT  MIDURETHRAL SLING;  Surgeon: Cathlyn JAYSON Nikki Bobie FORBES, MD;  Location: [facility];  Service: Gynecology;  Laterality: N/A;  EXACT MIDURETHRAL SLING   CARDIAC CATHETERIZATION  2009   results normal done in [place]    CHOLECYSTECTOMY  1995   laparoscopic   CYSTOSCOPY N/A 06/14/2020   Procedure: CYSTOSCOPY;  Surgeon: Cathlyn JAYSON Nikki Bobie FORBES, MD;  Location: [facility];  Service: Gynecology;  Laterality: N/A;   HAMMER TOE SURGERY Bilateral 2009   HERNIA REPAIR  2015   Has abdominal wall mesh - [place], [place] .  Laparoscopic incisional hernia repari with 7  x 9 inch Ventralight ST with Echo mesh   LAPAROSCOPIC HYSTERECTOMY  2007   Supracervical hysterectomy with cystosctopy - [place], [place] partial   THYROID  SURGERY  2013   total thyroidectomy due to 11 nodules   Social History   Tobacco Use   Smoking status: Never    Passive exposure: Never   Smokeless tobacco: Never  Vaping Use   Vaping status: Never Used  Substance Use Topics   Alcohol use: Yes    Alcohol/week: 1.0 standard drink of alcohol    Types: 1 Glasses of wine per week    Comment: occ   Drug use: Never   Social History   Social History Narrative   Right Handed   Lives in a two story home     Immunization History  Administered Date(s) Administered   Influenza,inj,Quad PF,6+ Mos 06/28/2018, 06/18/2019, 06/12/2022   Influenza-Unspecified 08/05/2020, 05/25/2021, 06/12/2022, 06/28/2023   Moderna Sars-Covid-2 Vaccination 01/27/2021   PFIZER(Purple Top)SARS-COV-2 Vaccination 12/08/2019, 01/05/2020, 08/05/2020   Pneumococcal Polysaccharide-23 10/22/2018   Pneumococcal-Unspecified 06/28/2023   Td 06/12/2022   Tdap 06/28/2018, 06/12/2022   Zoster Recombinant(Shingrix) 04/15/2019, 07/16/2019     Objective: Vital Signs: BP 129/83   Pulse 82   Temp 97.7 F (36.5 C)   Resp 15   Ht 5' 7 (1.702 m)   Wt 160 lb 12.8 oz (72.9 kg)   LMP  (LMP Unknown)   BMI 25.18 kg/m    Physical  Exam Vitals and nursing note reviewed.  Constitutional:      Appearance: She is well-developed.  HENT:     Head: Normocephalic and atraumatic.  Eyes:     Conjunctiva/sclera: Conjunctivae normal.  Cardiovascular:     Rate and Rhythm: Normal rate and regular rhythm.     Heart sounds: Normal heart sounds.  Pulmonary:     Effort: Pulmonary effort is normal.     Breath sounds: Normal breath sounds.  Abdominal:     General: Bowel sounds are normal.     Palpations: Abdomen is soft.  Musculoskeletal:     Cervical back: Normal range of motion.  Lymphadenopathy:     Cervical: No cervical adenopathy.  Skin:    General: Skin is warm and dry.     Capillary Refill: Capillary refill takes less than 2 seconds.  Neurological:     Mental Status: She is alert and oriented to person, place, and time.  Psychiatric:        Behavior: Behavior normal.      Musculoskeletal Exam: C-spine, thoracic spine, lumbar spine have good range of motion.  No midline spinal tenderness.  No SI joint tenderness.  Shoulder joints, elbow joints, wrist joints, MCPs, PIPs, DIPs have good range of motion with no synovitis.  Complete fist formation bilaterally.  Hip joints have good range of motion with no groin pain.  Knee joints have good range of motion no warmth or effusion.  Ankle joints have good range of motion no tenderness or joint swelling.  No evidence of Achilles tendinitis or plantar fasciitis.   CDAI Exam: CDAI Score: -- Patient Global: --; Provider Global: -- Swollen: --; Tender: -- Joint Exam 08/12/2024   No joint exam has been documented for this visit   There is currently no information documented on the homunculus. Go to the Rheumatology activity and complete the homunculus joint exam.  Investigation: No additional findings.  Imaging: No results found.  Recent Labs: Lab Results  Component Value Date   WBC 5.6 04/29/2024   HGB 12.9  04/29/2024   PLT 267 04/29/2024   NA 139 04/29/2024   K  4.3 04/29/2024   CL 104 04/29/2024   CO2 28 04/29/2024   GLUCOSE 76 04/29/2024   BUN 15 04/29/2024   CREATININE 0.90 04/29/2024   BILITOT 0.8 04/29/2024   ALKPHOS 52 10/20/2021   AST 29 04/29/2024   ALT 20 04/29/2024   PROT 6.7 04/29/2024   ALBUMIN 4.7 10/20/2021   CALCIUM  9.6 04/29/2024   GFRAA 94 02/08/2021   QFTBGOLDPLUS NEGATIVE 06/26/2023    Speciality Comments: PLQ Eye Exam: 10/02/2023 WNL  [facility]   Inadequate response to methotrexate , Enbrel , Humira , Orencia -hives everywhere Rinvoq -pneumonia Arava -11/2021  Procedures:  No procedures performed Allergies: Orencia  [abatacept ], Shellfish allergy , Strawberry (diagnostic), Gluten meal, Lortab [hydrocodone-acetaminophen ], Morphine and codeine , and Augmentin  [amoxicillin -pot clavulanate]   Assessment / Plan:     Visit Diagnoses: Rheumatoid arthritis of multiple sites with negative rheumatoid factor (HCC) - Diagnosed at [place]  [facility].  Previous ultrasound examination performed showed synovitis in bilateral hands: Patient presents today with increased arthralgias and joint stiffness especially in both feet and the left knee joint.  She is been taking Xeljanz  5 mg 1 tablet daily and Plaquenil  200 mg 1 tablet by mouth twice daily.  She is tolerating combination therapy without any side effects and has not had any gaps in therapy.  She attributes the increased arthralgias and joint stiffness to colder weather temperatures.  Patient is interested in proceeding with viscosupplementation for the left knee.  She is also considering being fitted for orthotics to improve her bilateral foot pain. She has been taking oral diclofenac  and Tylenol  sparingly for symptomatic relief. She will remain on Xeljanz  and Plaquenil  as prescribed.  She was advised to notify us  if she develops any signs or symptoms of a flare.  She will follow-up in the office in 5 months or sooner if needed.  High risk medication use - Xeljanz  5 mg  1 tablet by mouth daily, Plaquenil  200 mg 1 tablet po BID. Previous therapy: Enbrel , Humira , Orencia , Rinvoq , methotrexate , Kevzara ,Arava . CBC and CMP WNL on 07/09/24.  Her next lab work will be due at the end of January and every 3 months to monitor for drug toxicity. Lipid panel updated on 07/09/24--LDL 123, total cholesterol 203.  TB gold negative on 06/26/23. Order for TB gold released today.  No recent or recurrent infections. Discussed the importance of holding xeljanz  if she develops signs or symptoms of an infection and to resume once the infection has completely cleared.  She received the annual flu shot and COVID-19 vaccine in September 2025. PLQ Eye Exam: 10/02/2023 WNL [facility]   - Plan: QuantiFERON-TB Gold Plus  Screening for tuberculosis - Order for TB gold released today. Plan: QuantiFERON-TB Gold Plus  Positive ANA (antinuclear antibody) - 04/16/2023: ANA positive, RNP +4.8, dsDNA-, Smith-, SCL 70-, Ro-, La- antichromatin antibodies negative, anti-Jo1 negative, anticentromere B antibodies negative.  Hair loss: She continues to have hair loss despite taking biotin daily.   Pain in both hands: No synovitis noted on examination today.   Trochanteric bursitis of both hips: Intermittent discomfort.   Primary osteoarthritis of left knee: MRI of the left knee 03/08/2023: Severe lateral predominant patellofemoral chondral thinning with mild subchondral cystic change.  Different treatment options were discussed.  She continues to have discomfort affecting the left knee.  No warmth or effusion noted.   Patient has requested to proceed with applying for viscosupplementaiton for the left knee. Patient remains on Zepbound  for weight loss.  She has noticed a benefit in her joint pain and stiffness due to underlying osteoarthritis since losing weight. She will benefit from continuing the use of zebound to improve her quality of life and mobility.   This patient is diagnosed with  osteoarthritis of the knee(s).    Radiographs show evidence of joint space narrowing, osteophytes, subchondral sclerosis and/or subchondral cysts.  This patient has knee pain which interferes with functional and activities of daily living.    This patient has experienced inadequate response, adverse effects and/or intolerance with conservative treatments such as acetaminophen , NSAIDS, topical creams, physical therapy or regular exercise, knee bracing and/or weight loss.   This patient is not scheduled to have a total knee replacement within 6 months of starting treatment with viscosupplementation.  Primary osteoarthritis of both feet - Under care of Dr. Arnaldo. X-rays of both feet from 09/27/2018 were reviewed today and are consistent w/ OA and postsurgical changes. Tried PT. She continues to have chronic pain and stiffness in both feet which has been exacerbated with the colder weather temperatures.  She has no synovitis on examination today.  Discussed the importance of wearing proper fitting shoes.  Other medical conditions are listed as follows:   Keratosis pilaris  Decreased cardiac ejection fraction  Hx of migraines  History of gastroesophageal reflux (GERD)  History of hypothyroidism  History of asthma    Orders: Orders Placed This Encounter  Procedures   QuantiFERON-TB Gold Plus   No orders of the defined types were placed in this encounter.    Follow-Up Instructions: Return in about 5 months (around 01/10/2025) for Rheumatoid arthritis.   Waddell CHRISTELLA Craze, PA-C  Note - This record has been created using Dragon software.  Chart creation errors have been sought, but may not always  have been located. Such creation errors do not reflect on  the standard of medical care.

## 2024-08-12 ENCOUNTER — Encounter: Payer: Self-pay | Admitting: Physician Assistant

## 2024-08-12 ENCOUNTER — Telehealth: Payer: Self-pay

## 2024-08-12 ENCOUNTER — Ambulatory Visit: Attending: Physician Assistant | Admitting: Physician Assistant

## 2024-08-12 VITALS — BP 129/83 | HR 82 | Temp 97.7°F | Resp 15 | Ht 67.0 in | Wt 160.8 lb

## 2024-08-12 DIAGNOSIS — Z79899 Other long term (current) drug therapy: Secondary | ICD-10-CM | POA: Diagnosis not present

## 2024-08-12 DIAGNOSIS — M0609 Rheumatoid arthritis without rheumatoid factor, multiple sites: Secondary | ICD-10-CM | POA: Diagnosis not present

## 2024-08-12 DIAGNOSIS — Z8669 Personal history of other diseases of the nervous system and sense organs: Secondary | ICD-10-CM

## 2024-08-12 DIAGNOSIS — Z8639 Personal history of other endocrine, nutritional and metabolic disease: Secondary | ICD-10-CM

## 2024-08-12 DIAGNOSIS — M79642 Pain in left hand: Secondary | ICD-10-CM

## 2024-08-12 DIAGNOSIS — M1712 Unilateral primary osteoarthritis, left knee: Secondary | ICD-10-CM

## 2024-08-12 DIAGNOSIS — R7689 Other specified abnormal immunological findings in serum: Secondary | ICD-10-CM

## 2024-08-12 DIAGNOSIS — M19071 Primary osteoarthritis, right ankle and foot: Secondary | ICD-10-CM

## 2024-08-12 DIAGNOSIS — L659 Nonscarring hair loss, unspecified: Secondary | ICD-10-CM | POA: Diagnosis not present

## 2024-08-12 DIAGNOSIS — Z8719 Personal history of other diseases of the digestive system: Secondary | ICD-10-CM

## 2024-08-12 DIAGNOSIS — R931 Abnormal findings on diagnostic imaging of heart and coronary circulation: Secondary | ICD-10-CM

## 2024-08-12 DIAGNOSIS — M19072 Primary osteoarthritis, left ankle and foot: Secondary | ICD-10-CM

## 2024-08-12 DIAGNOSIS — L858 Other specified epidermal thickening: Secondary | ICD-10-CM

## 2024-08-12 DIAGNOSIS — Z111 Encounter for screening for respiratory tuberculosis: Secondary | ICD-10-CM

## 2024-08-12 DIAGNOSIS — M79641 Pain in right hand: Secondary | ICD-10-CM

## 2024-08-12 DIAGNOSIS — Z8709 Personal history of other diseases of the respiratory system: Secondary | ICD-10-CM

## 2024-08-12 DIAGNOSIS — M7061 Trochanteric bursitis, right hip: Secondary | ICD-10-CM

## 2024-08-12 DIAGNOSIS — M7062 Trochanteric bursitis, left hip: Secondary | ICD-10-CM

## 2024-08-12 NOTE — Patient Instructions (Addendum)
 Standing Labs We placed an order today for your standing lab work.   Please have your standing labs drawn at end of January and every 3 months   Please have your labs drawn 2 weeks prior to your appointment so that the provider can discuss your lab results at your appointment, if possible.  Please note that you may see your imaging and lab results in MyChart before we have reviewed them. We will contact you once all results are reviewed. Please allow our office up to 72 hours to thoroughly review all of the results before contacting the office for clarification of your results.  WALK-IN LAB HOURS  Monday through Thursday from 8:00 am - 4:30 pm and Friday from 8:00 am-12:00 pm.  Patients with office visits requiring labs will be seen before walk-in labs.  You may encounter longer than normal wait times. Please allow additional time. Wait times may be shorter on  Monday and Thursday afternoons.  We do not book appointments for walk-in labs. We appreciate your patience and understanding with our staff.   Labs are drawn by [facility]. Please bring your co-pay at the time of your lab draw.  You may receive a bill from [facility] for your lab work.  Please note if you are on Hydroxychloroquine  and and an order has been placed for a Hydroxychloroquine  level,  you will need to have it drawn 4 hours or more after your last dose.  If you wish to have your labs drawn at another location, please call the office 24 hours in advance so we can fax the orders.  The office is located at [address], [address], [place], [place] [address]   If you have any questions regarding directions or hours of operation,  please call [phone].   As a reminder, please drink plenty of water prior to coming for your lab work. Thanks!

## 2024-08-12 NOTE — Telephone Encounter (Signed)
 Per Waddell Craze, please apply for left knee visco injections. Thank you.

## 2024-08-14 LAB — QUANTIFERON-TB GOLD PLUS
Mitogen-NIL: 7.56 [IU]/mL
NIL: 0.02 [IU]/mL
QuantiFERON-TB Gold Plus: NEGATIVE
TB1-NIL: 0 [IU]/mL
TB2-NIL: 0 [IU]/mL

## 2024-08-17 ENCOUNTER — Ambulatory Visit: Payer: Self-pay | Admitting: Physician Assistant

## 2024-08-17 NOTE — Progress Notes (Signed)
 TB gold negative

## 2024-08-22 ENCOUNTER — Other Ambulatory Visit: Payer: Self-pay | Admitting: Rheumatology

## 2024-08-22 DIAGNOSIS — M0609 Rheumatoid arthritis without rheumatoid factor, multiple sites: Secondary | ICD-10-CM

## 2024-08-22 DIAGNOSIS — Z79899 Other long term (current) drug therapy: Secondary | ICD-10-CM

## 2024-09-22 ENCOUNTER — Other Ambulatory Visit: Payer: Self-pay

## 2024-09-22 DIAGNOSIS — Z79899 Other long term (current) drug therapy: Secondary | ICD-10-CM

## 2024-09-23 DIAGNOSIS — M0609 Rheumatoid arthritis without rheumatoid factor, multiple sites: Secondary | ICD-10-CM

## 2024-09-23 DIAGNOSIS — Z79899 Other long term (current) drug therapy: Secondary | ICD-10-CM

## 2024-09-23 LAB — COMPREHENSIVE METABOLIC PANEL WITH GFR
AG Ratio: 1.7 (calc) (ref 1.0–2.5)
ALT: 30 U/L — ABNORMAL HIGH (ref 6–29)
AST: 31 U/L (ref 10–35)
Albumin: 4.3 g/dL (ref 3.6–5.1)
Alkaline phosphatase (APISO): 44 U/L (ref 37–153)
BUN: 10 mg/dL (ref 7–25)
CO2: 26 mmol/L (ref 20–32)
Calcium: 9.9 mg/dL (ref 8.6–10.4)
Chloride: 105 mmol/L (ref 98–110)
Creat: 0.8 mg/dL (ref 0.50–1.03)
Globulin: 2.5 g/dL (ref 1.9–3.7)
Glucose, Bld: 76 mg/dL (ref 65–99)
Potassium: 5 mmol/L (ref 3.5–5.3)
Sodium: 141 mmol/L (ref 135–146)
Total Bilirubin: 0.7 mg/dL (ref 0.2–1.2)
Total Protein: 6.8 g/dL (ref 6.1–8.1)
eGFR: 86 mL/min/1.73m2

## 2024-09-23 LAB — CBC WITH DIFFERENTIAL/PLATELET
Absolute Lymphocytes: 1388 {cells}/uL (ref 850–3900)
Absolute Monocytes: 427 {cells}/uL (ref 200–950)
Basophils Absolute: 38 {cells}/uL (ref 0–200)
Basophils Relative: 0.7 %
Eosinophils Absolute: 97 {cells}/uL (ref 15–500)
Eosinophils Relative: 1.8 %
HCT: 40.9 % (ref 35.9–46.0)
Hemoglobin: 13.1 g/dL (ref 11.7–15.5)
MCH: 28.3 pg (ref 27.0–33.0)
MCHC: 32 g/dL (ref 31.6–35.4)
MCV: 88.3 fL (ref 81.4–101.7)
MPV: 11.4 fL (ref 7.5–12.5)
Monocytes Relative: 7.9 %
Neutro Abs: 3451 {cells}/uL (ref 1500–7800)
Neutrophils Relative %: 63.9 %
Platelets: 261 Thousand/uL (ref 140–400)
RBC: 4.63 Million/uL (ref 3.80–5.10)
RDW: 13 % (ref 11.0–15.0)
Total Lymphocyte: 25.7 %
WBC: 5.4 Thousand/uL (ref 3.8–10.8)

## 2024-09-23 MED ORDER — XELJANZ 5 MG PO TABS: 1.0000 | ORAL_TABLET | Freq: Every day | ORAL | 0 refills | Status: AC

## 2024-09-23 NOTE — Telephone Encounter (Signed)
 Last Fill: 06/27/2024  Labs:09/22/2024 CBC WNL. ALT is borderline elevated, rest of CMP WNL.    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

## 2024-09-23 NOTE — Progress Notes (Signed)
 CBC WNL. ALT is borderline elevated, rest of CMP WNL.

## 2024-09-23 NOTE — Telephone Encounter (Signed)
VOB submitted for Euflexxa, Left knee(s) BV pending

## 2024-09-24 NOTE — Telephone Encounter (Signed)
 Please call to schedule visco injections.  Approved for Euflexxa, Left knee(s). Buy & Bill No Co-pay Deductible must be met $3700 (met $31.05) before coverage applies Specialist office visits, drugs and procedures are covered at 80% of the allowable with a 20% coinsurance. If out of pocket is met $6500 (met $31.05) coverage goes to 100%. Medical notes must be submitted with the claim.  Fax number to send medical notes to [facility] is [phone] No pre-certifications

## 2024-09-25 NOTE — Telephone Encounter (Signed)
 SCHEDULED: check box once inj arrive

## 2024-09-25 NOTE — Telephone Encounter (Signed)
Lvm to schedule injections

## 2024-10-02 ENCOUNTER — Other Ambulatory Visit: Payer: Self-pay

## 2024-10-02 DIAGNOSIS — M1230 Palindromic rheumatism, unspecified site: Secondary | ICD-10-CM

## 2024-10-02 LAB — OPHTHALMOLOGY REPORT-SCANNED: A Comment: NORMAL

## 2024-10-02 MED ORDER — HYDROXYCHLOROQUINE SULFATE 200 MG PO TABS: ORAL_TABLET | ORAL | Status: AC

## 2024-10-02 NOTE — Progress Notes (Signed)
 Received the patient's eye exam from Dr. Octavia. Per Dr. Octavia, he is inclined to consider lowering the dose slightly to the extent the patient can tolerate this. She is presently on 400 mg daily, which is slightly above the recommended 360 mg daily based on her weight. Per Waddell Craze, PA-C. Okay for patient reduce dose of PLQ to BID M-F only. Will place no print and contact the patient. Please review and sign.   Contacted the patient and advised per Waddell Craze, PA-C, Okay for patient reduce dose of PLQ to BID M-F only. Patient verbalized understanding.

## 2024-11-18 ENCOUNTER — Ambulatory Visit: Admitting: Physician Assistant

## 2024-11-25 ENCOUNTER — Ambulatory Visit: Admitting: Physician Assistant

## 2024-12-02 ENCOUNTER — Ambulatory Visit: Admitting: Physician Assistant

## 2025-01-13 ENCOUNTER — Ambulatory Visit: Admitting: Physician Assistant
